# Patient Record
Sex: Male | Born: 1955
Health system: Southern US, Community
[De-identification: ages and names within clinical notes are randomized; demographics above are authoritative.]

## PROBLEM LIST (undated history)

## (undated) DIAGNOSIS — Z87442 Personal history of urinary calculi: Secondary | ICD-10-CM

## (undated) DIAGNOSIS — F319 Bipolar disorder, unspecified: Secondary | ICD-10-CM

## (undated) DIAGNOSIS — F32A Depression, unspecified: Secondary | ICD-10-CM

## (undated) DIAGNOSIS — Z136 Encounter for screening for cardiovascular disorders: Secondary | ICD-10-CM

## (undated) DIAGNOSIS — G4730 Sleep apnea, unspecified: Secondary | ICD-10-CM

## (undated) DIAGNOSIS — I219 Acute myocardial infarction, unspecified: Secondary | ICD-10-CM

## (undated) DIAGNOSIS — N200 Calculus of kidney: Secondary | ICD-10-CM

## (undated) DIAGNOSIS — I519 Heart disease, unspecified: Secondary | ICD-10-CM

## (undated) DIAGNOSIS — R55 Syncope and collapse: Secondary | ICD-10-CM

## (undated) DIAGNOSIS — K219 Gastro-esophageal reflux disease without esophagitis: Secondary | ICD-10-CM

## (undated) DIAGNOSIS — I209 Angina pectoris, unspecified: Secondary | ICD-10-CM

## (undated) DIAGNOSIS — F419 Anxiety disorder, unspecified: Secondary | ICD-10-CM

## (undated) DIAGNOSIS — K635 Polyp of colon: Secondary | ICD-10-CM

## (undated) DIAGNOSIS — I10 Essential (primary) hypertension: Secondary | ICD-10-CM

## (undated) DIAGNOSIS — M1990 Unspecified osteoarthritis, unspecified site: Secondary | ICD-10-CM

## (undated) DIAGNOSIS — E119 Type 2 diabetes mellitus without complications: Secondary | ICD-10-CM

## (undated) DIAGNOSIS — M6200 Separation of muscle (nontraumatic), unspecified site: Secondary | ICD-10-CM

## (undated) DIAGNOSIS — E785 Hyperlipidemia, unspecified: Secondary | ICD-10-CM

## (undated) HISTORY — DX: Hyperlipidemia, unspecified: E78.5

## (undated) HISTORY — DX: Polyp of colon: K63.5

## (undated) HISTORY — DX: Heart disease, unspecified: I51.9

## (undated) HISTORY — DX: Essential (primary) hypertension: I10

## (undated) HISTORY — DX: Encounter for screening for cardiovascular disorders: Z13.6

## (undated) HISTORY — DX: Anxiety disorder, unspecified: F41.9

## (undated) HISTORY — DX: Gastro-esophageal reflux disease without esophagitis: K21.9

## (undated) HISTORY — DX: Syncope and collapse: R55

## (undated) HISTORY — PX: UPPER GI ENDOSCOPY: SHX6162

## (undated) HISTORY — DX: Unspecified osteoarthritis, unspecified site: M19.90

## (undated) HISTORY — PX: COLONOSCOPY: SHX174

---

## 1985-02-17 HISTORY — PX: INGUINAL HERNIA REPAIR: SUR1180

## 1998-02-17 HISTORY — PX: HEMORRHOID SURGERY: SHX153

## 2003-12-04 ENCOUNTER — Ambulatory Visit: Payer: Self-pay | Admitting: Internal Medicine

## 2006-02-17 DIAGNOSIS — K635 Polyp of colon: Secondary | ICD-10-CM

## 2006-02-17 HISTORY — DX: Polyp of colon: K63.5

## 2007-04-14 ENCOUNTER — Encounter: Payer: Self-pay | Admitting: Cardiology

## 2008-04-05 ENCOUNTER — Encounter: Payer: Self-pay | Admitting: Cardiology

## 2008-09-05 ENCOUNTER — Encounter: Payer: Self-pay | Admitting: Cardiology

## 2008-10-18 HISTORY — PX: LITHOTRIPSY: SUR834

## 2008-10-27 ENCOUNTER — Encounter: Payer: Self-pay | Admitting: Cardiology

## 2008-10-30 ENCOUNTER — Ambulatory Visit: Payer: Self-pay | Admitting: Cardiology

## 2008-10-30 DIAGNOSIS — R55 Syncope and collapse: Secondary | ICD-10-CM | POA: Insufficient documentation

## 2008-11-01 ENCOUNTER — Encounter: Payer: Self-pay | Admitting: Cardiology

## 2008-11-02 ENCOUNTER — Telehealth (INDEPENDENT_AMBULATORY_CARE_PROVIDER_SITE_OTHER): Payer: Self-pay | Admitting: *Deleted

## 2008-11-06 ENCOUNTER — Ambulatory Visit: Payer: Self-pay

## 2008-11-06 ENCOUNTER — Encounter: Payer: Self-pay | Admitting: Cardiology

## 2008-11-06 ENCOUNTER — Ambulatory Visit: Payer: Self-pay | Admitting: Cardiology

## 2008-11-09 ENCOUNTER — Ambulatory Visit: Payer: Self-pay | Admitting: Urology

## 2008-11-15 ENCOUNTER — Ambulatory Visit: Payer: Self-pay | Admitting: Urology

## 2008-11-23 ENCOUNTER — Ambulatory Visit: Payer: Self-pay | Admitting: Urology

## 2008-12-07 ENCOUNTER — Ambulatory Visit: Payer: Self-pay | Admitting: Urology

## 2009-02-17 DIAGNOSIS — M6200 Separation of muscle (nontraumatic), unspecified site: Secondary | ICD-10-CM

## 2009-02-17 DIAGNOSIS — Z136 Encounter for screening for cardiovascular disorders: Secondary | ICD-10-CM

## 2009-02-17 HISTORY — DX: Separation of muscle (nontraumatic), unspecified site: M62.00

## 2009-02-17 HISTORY — DX: Encounter for screening for cardiovascular disorders: Z13.6

## 2009-03-12 ENCOUNTER — Ambulatory Visit: Payer: Self-pay | Admitting: Urology

## 2009-04-15 ENCOUNTER — Emergency Department: Payer: Self-pay | Admitting: Emergency Medicine

## 2009-09-03 ENCOUNTER — Ambulatory Visit: Payer: Self-pay | Admitting: Urology

## 2009-10-11 ENCOUNTER — Ambulatory Visit: Payer: Self-pay | Admitting: Urology

## 2009-10-18 HISTORY — PX: URETEROSCOPY: SHX842

## 2009-10-24 ENCOUNTER — Ambulatory Visit: Payer: Self-pay | Admitting: Urology

## 2009-11-15 ENCOUNTER — Ambulatory Visit: Payer: Self-pay | Admitting: Urology

## 2010-04-08 ENCOUNTER — Ambulatory Visit: Payer: Self-pay | Admitting: Urology

## 2010-09-05 ENCOUNTER — Encounter: Payer: Self-pay | Admitting: Cardiology

## 2010-10-17 ENCOUNTER — Ambulatory Visit: Payer: Self-pay | Admitting: Urology

## 2011-02-18 DIAGNOSIS — I209 Angina pectoris, unspecified: Secondary | ICD-10-CM

## 2011-02-18 HISTORY — DX: Angina pectoris, unspecified: I20.9

## 2011-05-19 DIAGNOSIS — I219 Acute myocardial infarction, unspecified: Secondary | ICD-10-CM

## 2011-05-19 HISTORY — DX: Acute myocardial infarction, unspecified: I21.9

## 2011-06-18 HISTORY — PX: CARDIAC CATHETERIZATION: SHX172

## 2011-06-19 ENCOUNTER — Other Ambulatory Visit (HOSPITAL_COMMUNITY): Payer: Self-pay | Admitting: Family Medicine

## 2011-06-19 DIAGNOSIS — R079 Chest pain, unspecified: Secondary | ICD-10-CM

## 2011-06-19 DIAGNOSIS — R0602 Shortness of breath: Secondary | ICD-10-CM

## 2011-06-24 ENCOUNTER — Encounter: Payer: Self-pay | Admitting: Cardiology

## 2011-06-24 ENCOUNTER — Ambulatory Visit (HOSPITAL_COMMUNITY): Payer: BC Managed Care – PPO | Attending: Cardiology | Admitting: Radiology

## 2011-06-24 VITALS — BP 141/86 | Ht 72.0 in | Wt 227.0 lb

## 2011-06-24 DIAGNOSIS — I4949 Other premature depolarization: Secondary | ICD-10-CM

## 2011-06-24 DIAGNOSIS — R5381 Other malaise: Secondary | ICD-10-CM | POA: Insufficient documentation

## 2011-06-24 DIAGNOSIS — Z87891 Personal history of nicotine dependence: Secondary | ICD-10-CM | POA: Insufficient documentation

## 2011-06-24 DIAGNOSIS — E663 Overweight: Secondary | ICD-10-CM | POA: Insufficient documentation

## 2011-06-24 DIAGNOSIS — I471 Supraventricular tachycardia, unspecified: Secondary | ICD-10-CM

## 2011-06-24 DIAGNOSIS — R0789 Other chest pain: Secondary | ICD-10-CM | POA: Insufficient documentation

## 2011-06-24 DIAGNOSIS — Z8249 Family history of ischemic heart disease and other diseases of the circulatory system: Secondary | ICD-10-CM | POA: Insufficient documentation

## 2011-06-24 DIAGNOSIS — R079 Chest pain, unspecified: Secondary | ICD-10-CM

## 2011-06-24 DIAGNOSIS — R5383 Other fatigue: Secondary | ICD-10-CM | POA: Insufficient documentation

## 2011-06-24 DIAGNOSIS — I10 Essential (primary) hypertension: Secondary | ICD-10-CM | POA: Insufficient documentation

## 2011-06-24 DIAGNOSIS — E785 Hyperlipidemia, unspecified: Secondary | ICD-10-CM | POA: Insufficient documentation

## 2011-06-24 MED ORDER — TECHNETIUM TC 99M TETROFOSMIN IV KIT
11.0000 | PACK | Freq: Once | INTRAVENOUS | Status: AC | PRN
  Administered 2011-06-24: 11 via INTRAVENOUS

## 2011-06-24 MED ORDER — TECHNETIUM TC 99M TETROFOSMIN IV KIT
33.0000 | PACK | Freq: Once | INTRAVENOUS | Status: AC | PRN
  Administered 2011-06-24: 33 via INTRAVENOUS

## 2011-06-24 NOTE — Progress Notes (Signed)
[facility] SITE 3 NUCLEAR MED [address] [place] [address] [phone]  Cardiology Nuclear Med Study  Keith Gross is a 56 y.o. male     MRN : [id number]     DOB: 1955-04-26  Procedure Date: 06/24/2011  Nuclear Med Background Indication for Stress Test:  Evaluation for Ischemia History:  '10 Echo:EF=55-60%; '10 MPS:No ischemia, EF=59% Cardiac Risk Factors: Family History - CAD, History of Smoking, Hypertension, Lipids and Overweight  Symptoms:  Chest Pressure.  (last episode of chest discomfort was 10-days ago) and Fatigue   Nuclear Pre-Procedure Caffeine/Decaff Intake:  None NPO After: 7:00am   Lungs:  Clear. IV 0.9% NS with Angio Cath:  18g  IV Site: R Antecubital  IV Started by:  Stanton Kidney, EMT-P  Chest Size (in):  48 Cup Size: n/a  Height: 6' (1.829 m)  Weight:  227 lb (102.967 kg)  BMI:  Body mass index is 30.79 kg/(m^2). Tech Comments:  AM meds. taken    Nuclear Med Study 1 or 2 day study: 1 day  Stress Test Type:  Stress  Reading MD: Willa Rough, MD  Order Authorizing Provider:  Nilda Simmer, MD  Resting Radionuclide: Technetium 63m Tetrofosmin  Resting Radionuclide Dose: 11.0 mCi   Stress Radionuclide:  Technetium 69m Tetrofosmin  Stress Radionuclide Dose: 33.0 mCi           Stress Protocol Rest HR: 71 Stress HR: 156  Rest BP: 141/86 Stress BP: 211/86  Exercise Time (min): 10:45 METS: 12.9   Predicted Max HR: 166 bpm % Max HR: 93.98 bpm Rate Pressure Product: 21308   Dose of Adenosine (mg):  n/a Dose of Lexiscan: n/a mg  Dose of Atropine (mg): n/a Dose of Dobutamine: n/a mcg/kg/min (at max HR)  Stress Test Technologist: Smiley Houseman, CMA-N  Nuclear Technologist:  Domenic Polite, CNMT     Rest Procedure:  Myocardial perfusion imaging was performed at rest 45 minutes following the intravenous administration of Technetium 59m Tetrofosmin.  Rest ECG: No acute changes  Stress Procedure:  The patient exercised on the treadmill  utilizing the Bruce protocol for 10:45 minutes. He then stopped due to fatigue and denied any chest pain.  There were no diagnostic ST-T wave changes; there were frequent PVC's and runs of v-tach.  Hypertensive response to exercise, 211/86.    Technetium 102m Tetrofosmin was injected at peak exercise and myocardial perfusion imaging was performed after a brief delay.  Stress ECG: No significant change from baseline ECG  QPS Raw Data Images:  Patient motion noted; appropriate software correction applied. Stress Images:  There is severe decrease in photon activity in the base, mid, and apical segments of the inferolateral wall. There is severe decreased activity in the base, mid, and apical segments of the anterolateral wall. There is mild decreased activity in the segments of the inferior wall. Rest Images:  The rest images are very similar to the stress images. Subtraction (SDS):  No evidence of ischemia. Transient Ischemic Dilatation (Normal <1.22):  0.97 Lung/Heart Ratio (Normal <0.45):  0.41  Quantitative Gated Spect Images QGS EDV:  150 ml QGS ESV:  76 ml  Impression Exercise Capacity:  Excellent exercise capacity. BP Response:  Hypertensive blood pressure response. Clinical Symptoms:  No symptoms. ECG Impression:  No significant ST segment change suggestive of ischemia. Comparison with Prior Nuclear Study:  Refer to the comments below  Overall Impression:  There is a large scar affecting the inferolateral wall and the lateral wall. There is  no significant ischemia. These abnormalities are new since the prior study dated September, 2010. The patient exercises well. However since these changes are new we have made plans for the patient to be seen in the office in [facility] tomorrow  LV Ejection Fraction: 49%.  LV Wall Motion:  There is hypokinesis of the inferolateral wall and lateral wall.  Jerral Bonito, MD

## 2011-06-24 NOTE — Progress Notes (Signed)
   The patient had a nuclear stress study today in the office. With stress he did not have any marked chest pain or EKG changes. After he was gone the images were reviewed carefully. The report is currently being generated. The patient has a large area of scar in the inferolateral and lateral wall. This was not present on the nuclear study that was done in our office in 2010. The study was obtained by the patient's primary physician because of recent chest discomfort. With this new finding I have contacted our [place] office and asked that the patient be added into the office schedule tomorrow for early followup.  Jerral Bonito, MD

## 2011-06-25 ENCOUNTER — Encounter: Payer: Self-pay | Admitting: Cardiovascular Disease

## 2011-06-25 ENCOUNTER — Other Ambulatory Visit: Payer: Self-pay | Admitting: Cardiovascular Disease

## 2011-06-25 ENCOUNTER — Ambulatory Visit (INDEPENDENT_AMBULATORY_CARE_PROVIDER_SITE_OTHER): Payer: BC Managed Care – PPO | Admitting: Cardiovascular Disease

## 2011-06-25 VITALS — BP 158/92 | HR 82 | Ht 72.0 in | Wt 228.0 lb

## 2011-06-25 DIAGNOSIS — Z01818 Encounter for other preprocedural examination: Secondary | ICD-10-CM

## 2011-06-25 DIAGNOSIS — I10 Essential (primary) hypertension: Secondary | ICD-10-CM | POA: Insufficient documentation

## 2011-06-25 DIAGNOSIS — R9439 Abnormal result of other cardiovascular function study: Secondary | ICD-10-CM | POA: Insufficient documentation

## 2011-06-25 DIAGNOSIS — R0789 Other chest pain: Secondary | ICD-10-CM | POA: Insufficient documentation

## 2011-06-25 DIAGNOSIS — E785 Hyperlipidemia, unspecified: Secondary | ICD-10-CM | POA: Insufficient documentation

## 2011-06-25 DIAGNOSIS — Z5181 Encounter for therapeutic drug level monitoring: Secondary | ICD-10-CM

## 2011-06-25 MED ORDER — ATORVASTATIN CALCIUM 20 MG PO TABS: 20.0000 mg | ORAL_TABLET | Freq: Every day | ORAL | Status: DC

## 2011-06-25 NOTE — Assessment & Plan Note (Signed)
He reports blood pressure has been high. We will start exforge 5/160/25 mg daily. He can take additional HCTZ in the p.m. as directed by Dr. Assunta Gambles. If blood pressure continues to be elevated, the exforge dose could be titrated upwards.

## 2011-06-25 NOTE — Patient Instructions (Signed)
You are doing well. Please increase aspirin to 81 mg x2 a day Start lipitor 20mg  daily (1/2 a pill for the first week or two then increase to full pill if tolerated)  We will schedule a cardiac cath in [place]  Please hold diovan and HCTZ. Start exforge HCTZ 5/160/25 once a day. It is ok to take an additional HCTZ 12.5 mg daily  Please call us if you have new issues that need to be addressed before your next appt.

## 2011-06-25 NOTE — Assessment & Plan Note (Signed)
Perfusion defect seen in the inferolateral wall, large area. I suspect this vessel has occluded. Question will be whether it can be opened and or whether there are adequate collaterals. Cardiac catheterization planned for next week. Currently feels well vague chest discomfort.

## 2011-06-25 NOTE — Assessment & Plan Note (Signed)
We will start Lipitor 10 mg titrating to 20 mg daily as tolerated. Goal LDL less than 70.

## 2011-06-25 NOTE — Progress Notes (Signed)
Patient ID: Keith Gross, male    DOB: December 04, 1955, 56 y.o.   MRN: [id number]  HPI Comments: Keith Gross is a very pleasant 56 year old gentleman, patient of Dr. Nilda Simmer and Assunta Gambles, remote history of syncope in 2010 felt secondary to low blood pressure that improved with medication changes, presenting to Dr. Katrinka Blazing  with chest pain symptoms at the beginning of  April 2013, stress test this past week showing inferior lateral hypoperfusion consistent with large region of scar which is new from 2010. He presents for followup.  He reports that he had severe chest pain at the beginning of April, 8/10 pain. He feels that something is wrong in his chest but has not had severe pain like that over the past month. He was able to treadmill to a good level with peak heart rate in the 160 range. At home his blood pressure has been labile, sometimes up to systolic pressure of 200 though typically elevated. He is requesting further medication modification for his blood pressure. Currently he is taking HCTZ 12.5 mg in the morning and 25 mg in the evening, Diovan 80 mg daily.  He does have a previous history of smoking, quit in 1992. Father had his first heart attack at age 45. EKG shows normal sinus rhythm with rate 82 beats per minute with T wave abnormality in V5, V6, 1 and aVL, left axis deviation. These are new findings compared to previous EKG   Outpatient Encounter Prescriptions as of 06/25/2011  Medication Sig Dispense Refill  . allopurinol (ZYLOPRIM) 100 MG tablet Take 100 mg by mouth 2 (two) times daily.        Marland Kitchen aspirin 81 MG EC tablet Take 2 tablets (162 mg total) by mouth daily.  30 tablet    . CINNAMON PO Take 1,000 mg by mouth daily.      Marland Kitchen docusate sodium (COLACE) 100 MG capsule Take 300 mg by mouth every evening.        . fexofenadine (ALLEGRA) 180 MG tablet Take 180 mg by mouth daily as needed.        . hydrochlorothiazide (HYDRODIURIL) 25 MG tablet Take 25 mg by mouth in the p.m.  Also  takes half dose in the morning       . Omega-3 Fatty Acids (FISH OIL) 1000 MG CAPS Take 2,000 mg by mouth daily.      . pantoprazole (PROTONIX) 40 MG tablet Take 40 mg by mouth daily.      . potassium citrate (UROCIT-K) 10 MEQ (1080 MG) SR tablet Take 10 mEq by mouth 2 (two) times daily.      . Testosterone (ANDROGEL TD) Place 1.62 % onto the skin daily.      Marland Kitchen VITAMIN D, CHOLECALCIFEROL, PO Take 2,000 mg by mouth daily.      .  Diovan 80 mg tablet   1 per day       .  aspirin 81 MG EC tablet Take 81 mg by mouth daily.           Review of Systems  Constitutional: Negative.   HENT: Negative.   Eyes: Negative.   Respiratory: Positive for shortness of breath.   Cardiovascular: Positive for chest pain.  Gastrointestinal: Negative.   Musculoskeletal: Negative.   Skin: Negative.   Neurological: Negative.   Hematological: Negative.   Psychiatric/Behavioral: Negative.   All other systems reviewed and are negative.    BP 158/92  Pulse 82  Ht 6' (1.829 m)  [employer]  228 lb (103.42 kg)  BMI 30.92 kg/m2  Physical Exam  Nursing note and vitals reviewed. Constitutional: He is oriented to person, place, and time. He appears well-developed and well-nourished.  HENT:  Head: Normocephalic.  Nose: Nose normal.  Mouth/Throat: Oropharynx is clear and moist.  Eyes: Conjunctivae are normal. Pupils are equal, round, and reactive to light.  Neck: Normal range of motion. Neck supple. No JVD present.  Cardiovascular: Normal rate, regular rhythm, S1 normal, S2 normal, normal heart sounds and intact distal pulses.  Exam reveals no gallop and no friction rub.   No murmur heard. Pulmonary/Chest: Effort normal and breath sounds normal. No respiratory distress. He has no wheezes. He has no rales. He exhibits no tenderness.  Abdominal: Soft. Bowel sounds are normal. He exhibits no distension. There is no tenderness.  Musculoskeletal: Normal range of motion. He exhibits no edema and no tenderness.    Lymphadenopathy:    He has no cervical adenopathy.  Neurological: He is alert and oriented to person, place, and time. Coordination normal.  Skin: Skin is warm and dry. No rash noted. No erythema.  Psychiatric: He has a normal mood and affect. His behavior is normal. Judgment and thought content normal.           Assessment and Plan

## 2011-06-25 NOTE — Assessment & Plan Note (Signed)
I suspect he had an MI probably of the circumflex in early April 2013. This is seen on stress test. Mild vague symptoms since that time. We will set him up for a cardiac catheterization for further evaluation. We have increased his aspirin to 81 mg x2, started Lipitor 10 mg daily titrating up to 20 mg daily.

## 2011-06-26 LAB — CBC WITH DIFFERENTIAL/PLATELET
Basos: 0 % (ref 0–3)
Eos: 1 % (ref 0–7)
HCT: 48.6 % (ref 37.5–51.0)
Hemoglobin: 17 g/dL (ref 12.6–17.7)
Immature Granulocytes: 0 % (ref 0–2)
Lymphocytes Absolute: 1.6 10*3/uL (ref 0.7–4.5)
MCV: 91 fL (ref 79–97)
Monocytes: 8 % (ref 4–13)
Neutrophils Absolute: 3.6 10*3/uL (ref 1.8–7.8)
RBC: 5.33 x10E6/uL (ref 4.14–5.80)
RDW: 13.4 % (ref 12.3–15.4)
WBC: 5.7 10*3/uL (ref 4.0–10.5)

## 2011-06-26 LAB — BASIC METABOLIC PANEL
BUN/Creatinine Ratio: 15 (ref 9–20)
BUN: 15 mg/dL (ref 6–24)
CO2: 23 mmol/L (ref 20–32)
Creatinine, Ser: 0.97 mg/dL (ref 0.76–1.27)
GFR calc non Af Amer: 88 mL/min/{1.73_m2} (ref >59)

## 2011-06-26 LAB — PROTIME-INR: Prothrombin Time: 10.9 s (ref 9.1–12.0)

## 2011-06-27 ENCOUNTER — Telehealth: Payer: Self-pay | Admitting: Cardiovascular Disease

## 2011-06-27 NOTE — Telephone Encounter (Signed)
PT'S WIFE AWARE OF RECENT  LAB WORK ONLY ABNORMALITY WAS  AN  ELEVATED GLUCOSE   ONLY,   PER WIFE PT HAD EATEN PRIOR TO COMING IN .Zack Seal

## 2011-06-27 NOTE — Telephone Encounter (Signed)
New msg Pt's wife was calling about lab results

## 2011-06-29 ENCOUNTER — Encounter: Payer: Self-pay | Admitting: Cardiovascular Disease

## 2011-07-02 ENCOUNTER — Other Ambulatory Visit: Payer: Self-pay | Admitting: Cardiovascular Disease

## 2011-07-02 NOTE — H&P (Signed)
Keith Gross is a very pleasant 56 year old gentleman, patient of Dr. Nilda Simmer and Assunta Gambles, remote history of syncope in 2010 felt secondary to low blood pressure that improved with medication changes, presenting to Dr. Katrinka Blazing with chest pain symptoms at the beginning of April 2013, stress test this past week showing inferior lateral hypoperfusion consistent with large region of scar which is new from 2010. He presents for followup.  He reports that he had severe chest pain at the beginning of April, 8/10 pain. He feels that something is wrong in his chest but has not had severe pain like that over the past month. He was able to treadmill to a good level with peak heart rate in the 160 range.  At home his blood pressure has been labile, sometimes up to systolic pressure of 200 though typically elevated. He is requesting further medication modification for his blood pressure. Currently he is taking HCTZ 12.5 mg in the morning and 25 mg in the evening, Diovan 80 mg daily.  He does have a previous history of smoking, quit in 1992. Father had his first heart attack at age 31.  EKG shows normal sinus rhythm with rate 82 beats per minute with T wave abnormality in V5, V6, 1 and aVL, left axis deviation. These are new findings compared to previous EKG   Outpatient Encounter Prescriptions as of 06/25/2011   Medication  Sig  Dispense  Refill   .  allopurinol (ZYLOPRIM) 100 MG tablet  Take 100 mg by mouth 2 (two) times daily.     Marland Kitchen  aspirin 81 MG EC tablet  Take 2 tablets (162 mg total) by mouth daily.  30 tablet    .  CINNAMON PO  Take 1,000 mg by mouth daily.     Marland Kitchen  docusate sodium (COLACE) 100 MG capsule  Take 300 mg by mouth every evening.     .  fexofenadine (ALLEGRA) 180 MG tablet  Take 180 mg by mouth daily as needed.     .  hydrochlorothiazide (HYDRODIURIL) 25 MG tablet  Take 25 mg by mouth in the p.m.  Also takes half dose in the morning     .  Omega-3 Fatty Acids (FISH OIL) 1000 MG CAPS  Take 2,000  mg by mouth daily.     .  pantoprazole (PROTONIX) 40 MG tablet  Take 40 mg by mouth daily.     .  potassium citrate (UROCIT-K) 10 MEQ (1080 MG) SR tablet  Take 10 mEq by mouth 2 (two) times daily.     .  Testosterone (ANDROGEL TD)  Place 1.62 % onto the skin daily.     Marland Kitchen  VITAMIN D, CHOLECALCIFEROL, PO  Take 2,000 mg by mouth daily.     .  Diovan 80 mg tablet  1 per day     .  aspirin 81 MG EC tablet  Take 81 mg by mouth daily.     Review of Systems  Constitutional: Negative.  HENT: Negative.  Eyes: Negative.  Respiratory: Positive for shortness of breath.  Cardiovascular: Positive for chest pain.  Gastrointestinal: Negative.  Musculoskeletal: Negative.  Skin: Negative.  Neurological: Negative.  Hematological: Negative.  Psychiatric/Behavioral: Negative.  All other systems reviewed and are negative.   BP 158/92  Pulse 82  Ht 6' (1.829 m)  Wt 228 lb (103.42 kg)  BMI 30.92 kg/m2  Physical Exam  Nursing note and vitals reviewed.  Constitutional: He is oriented to person, place, and time. He appears well-developed  and well-nourished.  HENT:  Head: Normocephalic.  Nose: Nose normal.  Mouth/Throat: Oropharynx is clear and moist.  Eyes: Conjunctivae are normal. Pupils are equal, round, and reactive to light.  Neck: Normal range of motion. Neck supple. No JVD present.  Cardiovascular: Normal rate, regular rhythm, S1 normal, S2 normal, normal heart sounds and intact distal pulses. Exam reveals no gallop and no friction rub.  No murmur heard.  Pulmonary/Chest: Effort normal and breath sounds normal. No respiratory distress. He has no wheezes. He has no rales. He exhibits no tenderness.  Abdominal: Soft. Bowel sounds are normal. He exhibits no distension. There is no tenderness.  Musculoskeletal: Normal range of motion. He exhibits no edema and no tenderness.  Lymphadenopathy:  He has no cervical adenopathy.  Neurological: He is alert and oriented to person, place, and time.  Coordination normal.  Skin: Skin is warm and dry. No rash noted. No erythema.  Psychiatric: He has a normal mood and affect. His behavior is normal. Judgment and thought content normal.      Assessment and Plan   Chest pressure -  I suspect he had an MI probably of the circumflex in early April 2013.  This is seen on stress test. Mild vague symptoms since that time. We will set him up for a cardiac catheterization for further evaluation. We have increased his aspirin to 81 mg x2, started Lipitor 10 mg daily titrating up to 20 mg daily.  Abnormal stress test -  Perfusion defect seen in the inferolateral wall, large area. I suspect this vessel has occluded. Question will be whether it can be opened and or whether there are adequate collaterals. Cardiac catheterization planned . Currently feels well vague chest discomfort.  Hyperlipidemia -  We will start Lipitor 10 mg titrating to 20 mg daily as tolerated. Goal LDL less than 70.  HYPERTENSION, UNSPECIFIED -  He reports blood pressure has been high. We will start exforge 5/160/25 mg daily. He can take additional HCTZ in the p.m. as directed by Dr. Assunta Gambles. If blood pressure continues to be elevated, the exforge dose could be titrated upwards.

## 2011-07-03 ENCOUNTER — Inpatient Hospital Stay (HOSPITAL_BASED_OUTPATIENT_CLINIC_OR_DEPARTMENT_OTHER)
Admission: RE | Admit: 2011-07-03 | Discharge: 2011-07-03 | Disposition: A | Discharge status: Home / Self Care | Payer: BC Managed Care – PPO | Source: Ambulatory Visit | Attending: Cardiovascular Disease | Admitting: Cardiovascular Disease

## 2011-07-03 ENCOUNTER — Encounter (HOSPITAL_BASED_OUTPATIENT_CLINIC_OR_DEPARTMENT_OTHER): Payer: Self-pay | Admitting: Cardiovascular Disease

## 2011-07-03 ENCOUNTER — Telehealth: Payer: Self-pay

## 2011-07-03 ENCOUNTER — Encounter (HOSPITAL_BASED_OUTPATIENT_CLINIC_OR_DEPARTMENT_OTHER)
Admission: RE | Disposition: A | Discharge status: Home / Self Care | Payer: Self-pay | Source: Ambulatory Visit | Attending: Cardiovascular Disease

## 2011-07-03 ENCOUNTER — Other Ambulatory Visit: Payer: Self-pay | Admitting: Cardiovascular Disease

## 2011-07-03 DIAGNOSIS — I2510 Atherosclerotic heart disease of native coronary artery without angina pectoris: Secondary | ICD-10-CM | POA: Insufficient documentation

## 2011-07-03 DIAGNOSIS — R9431 Abnormal electrocardiogram [ECG] [EKG]: Secondary | ICD-10-CM | POA: Insufficient documentation

## 2011-07-03 DIAGNOSIS — R9439 Abnormal result of other cardiovascular function study: Secondary | ICD-10-CM | POA: Insufficient documentation

## 2011-07-03 DIAGNOSIS — R0602 Shortness of breath: Secondary | ICD-10-CM | POA: Insufficient documentation

## 2011-07-03 LAB — POCT ACTIVATED CLOTTING TIME: Activated Clotting Time: 138 seconds

## 2011-07-03 SURGERY — JV LEFT HEART CATHETERIZATION WITH CORONARY ANGIOGRAM
Anesthesia: Moderate Sedation

## 2011-07-03 MED ORDER — ONDANSETRON HCL 4 MG/2ML IJ SOLN: 4.0000 mg | Freq: Four times a day (QID) | INTRAMUSCULAR | Status: DC | PRN

## 2011-07-03 MED ORDER — ASPIRIN 81 MG PO CHEW
324.0000 mg | CHEWABLE_TABLET | ORAL | Status: AC
  Administered 2011-07-03: 324 mg via ORAL

## 2011-07-03 MED ORDER — CLOPIDOGREL BISULFATE 75 MG PO TABS: 75.0000 mg | ORAL_TABLET | Freq: Every day | ORAL | Status: DC

## 2011-07-03 MED ORDER — SODIUM CHLORIDE 0.9 % IJ SOLN
3.0000 mL | Freq: Two times a day (BID) | INTRAMUSCULAR | Status: DC
  Administered 2011-07-03: 3 mL via INTRAVENOUS

## 2011-07-03 MED ORDER — ACETAMINOPHEN 325 MG PO TABS: 650.0000 mg | ORAL_TABLET | ORAL | Status: DC | PRN

## 2011-07-03 NOTE — Progress Notes (Signed)
Bedrest begins @ 1530.

## 2011-07-03 NOTE — Op Note (Signed)
Cardiac Catheterization Procedure Note  Name: Keith Gross MRN: [id number] DOB: 07-31-1955  Procedure: Left Heart Cath, Selective Coronary Angiography, LV angiography  Indication:  Positive stress test, mild shortness of breath, abnormal EKG  Procedural details: The right groin was prepped, draped, and anesthetized with 1% lidocaine. Using modified Seldinger technique, a 5 French sheath was introduced into the right femoral artery. Standard Judkins catheters were used for coronary angiography and left ventriculography. Catheter exchanges were performed over a guidewire. There were no immediate procedural complications. The patient was transferred to the post catheterization recovery area for further monitoring.  Procedural Findings:  Hemodynamics:     AO 128/76/100    LV 129/13/17   Coronary angiography:  Coronary dominance: Right  Left mainstem:   Large vessel that bifurcates to the LAD and left circumflex. No significant disease noted.  Left anterior descending (LAD):   Moderate size vessel with mild luminal irregularities, Diagonal x2 that are small to moderate in size.   Left circumflex (LCx):  Large vessel with OM x2. OM1 is  sub-totally occluded in the proximal region after the takeoff of a small branch. Mid and bifurcating distal vessel fills late. Small left to left collaterals. There is at least moderate, possibly moderate to severe disease (60-70%) of the ostial true circumflex after the takeoff of the OM 2  Right coronary artery (RCA): Codominant or nondominant branch . Moderate size vessel with small distal PL branch. No significant disease noted  Left ventriculography: Left ventricular systolic function is normal, LVEF is estimated at 55-65%, there is no significant mitral regurgitation. No significant aortic valve stenosis  Final Conclusions:   severe single vessel disease with subtotally occluded OM1, minimal collaterals to mid and distal OM . Also with at least moderate,  possibly moderate to severe ostial true circumflex after the takeoff of the OM 2 .  Recommendations:  will discuss the case with interventional cardiology to determine if lesion is amenable to PCI . As he continues to have mild shortness of breath, some chest discomfort, would lean towards intervention . We'll discuss other lesion in the true circumflex with interventional cardiology as well . We'll recommend aggressive medical management .   Julien Nordmann 07/03/2011, 1:36 PM

## 2011-07-03 NOTE — Progress Notes (Signed)
Dr. Mariah Milling in to discuss results with patient and family.

## 2011-07-03 NOTE — Telephone Encounter (Signed)
Refill sent for plavix 75 mg take one tablet daily.  

## 2011-07-03 NOTE — Discharge Instructions (Signed)
Discharge pending discussion with interventional cardiology concerning severe circumflex stenosis, and need for possible PCI.

## 2011-07-04 ENCOUNTER — Telehealth: Payer: Self-pay

## 2011-07-04 NOTE — Telephone Encounter (Signed)
rec'd call from patient's wife. She was made aware of time change for procedure. Advised ok, per Dr. Mariah Milling, ok for patient to have clear liquid breakfast am of procedure. Understanding verb.

## 2011-07-04 NOTE — Telephone Encounter (Signed)
Rec'd call from cath lab saying they needed to r/s time of intervention scheduled for 5/22. R/s for 1330 from an original 0830 procedure. I called patient to give him this info but had to leave a message.

## 2011-07-07 ENCOUNTER — Encounter (HOSPITAL_COMMUNITY): Payer: Self-pay | Admitting: Respiratory Therapy

## 2011-07-09 ENCOUNTER — Other Ambulatory Visit: Payer: Self-pay | Admitting: Cardiovascular Disease

## 2011-07-09 ENCOUNTER — Encounter (HOSPITAL_COMMUNITY)
Admission: RE | Disposition: A | Discharge status: Home / Self Care | Payer: Self-pay | Source: Ambulatory Visit | Attending: Cardiovascular Disease

## 2011-07-09 ENCOUNTER — Ambulatory Visit (HOSPITAL_COMMUNITY)
Admission: RE | Admit: 2011-07-09 | Discharge: 2011-07-10 | Disposition: A | Discharge status: Home / Self Care | Payer: BC Managed Care – PPO | Source: Ambulatory Visit | Attending: Cardiovascular Disease | Admitting: Cardiovascular Disease

## 2011-07-09 ENCOUNTER — Encounter (HOSPITAL_COMMUNITY): Payer: Self-pay | Admitting: General Practice

## 2011-07-09 DIAGNOSIS — N200 Calculus of kidney: Secondary | ICD-10-CM

## 2011-07-09 DIAGNOSIS — R0789 Other chest pain: Secondary | ICD-10-CM

## 2011-07-09 DIAGNOSIS — F419 Anxiety disorder, unspecified: Secondary | ICD-10-CM | POA: Insufficient documentation

## 2011-07-09 DIAGNOSIS — M109 Gout, unspecified: Secondary | ICD-10-CM | POA: Insufficient documentation

## 2011-07-09 DIAGNOSIS — G4730 Sleep apnea, unspecified: Secondary | ICD-10-CM | POA: Insufficient documentation

## 2011-07-09 DIAGNOSIS — F411 Generalized anxiety disorder: Secondary | ICD-10-CM | POA: Insufficient documentation

## 2011-07-09 DIAGNOSIS — I25118 Atherosclerotic heart disease of native coronary artery with other forms of angina pectoris: Secondary | ICD-10-CM

## 2011-07-09 DIAGNOSIS — Z955 Presence of coronary angioplasty implant and graft: Secondary | ICD-10-CM

## 2011-07-09 DIAGNOSIS — I2510 Atherosclerotic heart disease of native coronary artery without angina pectoris: Secondary | ICD-10-CM | POA: Insufficient documentation

## 2011-07-09 DIAGNOSIS — I200 Unstable angina: Secondary | ICD-10-CM

## 2011-07-09 DIAGNOSIS — K219 Gastro-esophageal reflux disease without esophagitis: Secondary | ICD-10-CM | POA: Insufficient documentation

## 2011-07-09 DIAGNOSIS — E785 Hyperlipidemia, unspecified: Secondary | ICD-10-CM | POA: Insufficient documentation

## 2011-07-09 DIAGNOSIS — I10 Essential (primary) hypertension: Secondary | ICD-10-CM | POA: Insufficient documentation

## 2011-07-09 HISTORY — DX: Acute myocardial infarction, unspecified: I21.9

## 2011-07-09 HISTORY — DX: Sleep apnea, unspecified: G47.30

## 2011-07-09 HISTORY — DX: Separation of muscle (nontraumatic), unspecified site: M62.00

## 2011-07-09 HISTORY — DX: Angina pectoris, unspecified: I20.9

## 2011-07-09 HISTORY — PX: CORONARY ANGIOPLASTY WITH STENT PLACEMENT: SHX49

## 2011-07-09 HISTORY — DX: Calculus of kidney: N20.0

## 2011-07-09 HISTORY — PX: PERCUTANEOUS CORONARY STENT INTERVENTION (PCI-S): SHX5485

## 2011-07-09 LAB — CBC
HCT: 45.8 % (ref 39.0–52.0)
MCH: 31.5 pg (ref 26.0–34.0)
MCV: 88.4 fL (ref 78.0–100.0)
RBC: 5.18 MIL/uL (ref 4.22–5.81)
WBC: 7.4 10*3/uL (ref 4.0–10.5)

## 2011-07-09 LAB — POCT ACTIVATED CLOTTING TIME: Activated Clotting Time: 419 seconds

## 2011-07-09 LAB — BASIC METABOLIC PANEL
CO2: 29 mEq/L (ref 19–32)
Chloride: 104 mEq/L (ref 96–112)
Creatinine, Ser: 1.03 mg/dL (ref 0.50–1.35)
Glucose, Bld: 86 mg/dL (ref 70–99)
Sodium: 142 mEq/L (ref 135–145)

## 2011-07-09 SURGERY — PERCUTANEOUS CORONARY STENT INTERVENTION (PCI-S)
Anesthesia: LOCAL

## 2011-07-09 MED ORDER — HYDROCHLOROTHIAZIDE 25 MG PO TABS
25.0000 mg | ORAL_TABLET | Freq: Every day | ORAL | Status: DC
  Filled 2011-07-09: qty 1

## 2011-07-09 MED ORDER — LIDOCAINE HCL (PF) 1 % IJ SOLN
INTRAMUSCULAR | Status: AC
  Filled 2011-07-09: qty 30

## 2011-07-09 MED ORDER — TESTOSTERONE 20.25 MG/ACT (1.62%) TD GEL: 1.0000 "application " | Freq: Every day | TRANSDERMAL | Status: DC

## 2011-07-09 MED ORDER — AMLODIPINE BESYLATE 5 MG PO TABS
5.0000 mg | ORAL_TABLET | Freq: Every day | ORAL | Status: DC
  Filled 2011-07-09: qty 1

## 2011-07-09 MED ORDER — DIAZEPAM 5 MG PO TABS
ORAL_TABLET | ORAL | Status: AC
  Filled 2011-07-09: qty 1

## 2011-07-09 MED ORDER — ASPIRIN 81 MG PO CHEW
324.0000 mg | CHEWABLE_TABLET | ORAL | Status: AC
  Administered 2011-07-09: 324 mg via ORAL

## 2011-07-09 MED ORDER — SODIUM CHLORIDE 0.9 % IV SOLN: 250.0000 mL | INTRAVENOUS | Status: DC | PRN

## 2011-07-09 MED ORDER — ALLOPURINOL 100 MG PO TABS
100.0000 mg | ORAL_TABLET | Freq: Two times a day (BID) | ORAL | Status: DC
  Filled 2011-07-09 (×2): qty 1

## 2011-07-09 MED ORDER — BIVALIRUDIN 250 MG IV SOLR
INTRAVENOUS | Status: AC
  Filled 2011-07-09: qty 250

## 2011-07-09 MED ORDER — SODIUM CHLORIDE 0.9 % IJ SOLN: 3.0000 mL | Freq: Two times a day (BID) | INTRAMUSCULAR | Status: DC

## 2011-07-09 MED ORDER — ROSUVASTATIN CALCIUM 20 MG PO TABS
20.0000 mg | ORAL_TABLET | Freq: Every day | ORAL | Status: DC
  Administered 2011-07-09: 20 mg via ORAL
  Filled 2011-07-09 (×2): qty 1

## 2011-07-09 MED ORDER — NITROGLYCERIN 0.2 MG/ML ON CALL CATH LAB
INTRAVENOUS | Status: AC
  Filled 2011-07-09: qty 1

## 2011-07-09 MED ORDER — SODIUM CHLORIDE 0.9 % IV SOLN: 250.0000 mL | INTRAVENOUS | Status: DC

## 2011-07-09 MED ORDER — FLUTICASONE PROPIONATE 50 MCG/ACT NA SUSP
2.0000 | Freq: Every day | NASAL | Status: DC
  Filled 2011-07-09 (×2): qty 16

## 2011-07-09 MED ORDER — ACETAMINOPHEN 325 MG PO TABS: 650.0000 mg | ORAL_TABLET | ORAL | Status: DC | PRN

## 2011-07-09 MED ORDER — FENTANYL CITRATE 0.05 MG/ML IJ SOLN
INTRAMUSCULAR | Status: AC
  Filled 2011-07-09: qty 2

## 2011-07-09 MED ORDER — ASPIRIN 81 MG PO CHEW
CHEWABLE_TABLET | ORAL | Status: AC
  Filled 2011-07-09: qty 4

## 2011-07-09 MED ORDER — DOCUSATE SODIUM 100 MG PO CAPS
100.0000 mg | ORAL_CAPSULE | Freq: Two times a day (BID) | ORAL | Status: DC
  Administered 2011-07-09: 100 mg via ORAL
  Filled 2011-07-09 (×4): qty 1

## 2011-07-09 MED ORDER — HEPARIN (PORCINE) IN NACL 2-0.9 UNIT/ML-% IJ SOLN
INTRAMUSCULAR | Status: AC
  Filled 2011-07-09: qty 2000

## 2011-07-09 MED ORDER — SODIUM CHLORIDE 0.9 % IV SOLN
INTRAVENOUS | Status: DC
  Administered 2011-07-09: 13:00:00 via INTRAVENOUS

## 2011-07-09 MED ORDER — SODIUM CHLORIDE 0.9 % IV SOLN: 1.0000 mL/kg/h | INTRAVENOUS | Status: AC

## 2011-07-09 MED ORDER — SODIUM CHLORIDE 0.9 % IJ SOLN: 3.0000 mL | INTRAMUSCULAR | Status: DC | PRN

## 2011-07-09 MED ORDER — CLOPIDOGREL BISULFATE 75 MG PO TABS: 600.0000 mg | ORAL_TABLET | ORAL | Status: DC

## 2011-07-09 MED ORDER — ONDANSETRON HCL 4 MG/2ML IJ SOLN: 4.0000 mg | Freq: Four times a day (QID) | INTRAMUSCULAR | Status: DC | PRN

## 2011-07-09 MED ORDER — PANTOPRAZOLE SODIUM 40 MG PO TBEC
40.0000 mg | DELAYED_RELEASE_TABLET | Freq: Every day | ORAL | Status: DC
  Filled 2011-07-09 (×2): qty 1

## 2011-07-09 MED ORDER — POTASSIUM CITRATE ER 10 MEQ (1080 MG) PO TBCR
10.0000 meq | EXTENDED_RELEASE_TABLET | Freq: Every day | ORAL | Status: DC
  Filled 2011-07-09: qty 1

## 2011-07-09 MED ORDER — MIDAZOLAM HCL 2 MG/2ML IJ SOLN
INTRAMUSCULAR | Status: AC
  Filled 2011-07-09: qty 2

## 2011-07-09 MED ORDER — LORATADINE 10 MG PO TABS
10.0000 mg | ORAL_TABLET | Freq: Every day | ORAL | Status: DC
  Filled 2011-07-09 (×3): qty 1

## 2011-07-09 MED ORDER — AMLODIPINE-VALSARTAN-HCTZ 5-160-25 MG PO TABS: ORAL_TABLET | Freq: Every day | ORAL | Status: DC

## 2011-07-09 MED ORDER — DIAZEPAM 5 MG PO TABS
5.0000 mg | ORAL_TABLET | ORAL | Status: AC
  Administered 2011-07-09: 5 mg via ORAL

## 2011-07-09 MED ORDER — CLOPIDOGREL BISULFATE 75 MG PO TABS
75.0000 mg | ORAL_TABLET | Freq: Every day | ORAL | Status: DC
  Filled 2011-07-09: qty 1

## 2011-07-09 MED ORDER — CLOPIDOGREL BISULFATE 300 MG PO TABS
ORAL_TABLET | ORAL | Status: AC
  Filled 2011-07-09: qty 2

## 2011-07-09 MED ORDER — IRBESARTAN 150 MG PO TABS
150.0000 mg | ORAL_TABLET | Freq: Every day | ORAL | Status: DC
  Filled 2011-07-09: qty 1

## 2011-07-09 NOTE — CV Procedure (Signed)
   CARDIAC CATH NOTE  Name: Keith Gross MRN: [id number] DOB: [birth year]  Procedure: PTCA and stenting of the ramus intermedius  Indication: Exertional angina, severe CAD, abnormal stress test. Diagnostic cath last week showed subtotal occlusion of the ramus branch with TIMI-0-1 flow. The patient had single vessel disease, exertional angina, and was referred for PCI. He has been adequately preloaded with plavix.  Procedural Details: The right wrist was prepped, draped, and anesthetized with 1% lidocaine. Using the modified Seldinger technique, a 6 Fr sheath was introduced into the radial artery. 2.5 mg nicardipine was administered through the radial sheath. Weight-based bivalirudin was given for anticoagulation. Once a therapeutic ACT was achieved, a 6 [place] XB-LAD guide catheter was inserted.  A Cougar coronary guidewire was used to cross the lesion without difficulty.  The lesion was predilated with a 2.0x79mm Sprinter balloon.  The lesion was then stented with a 2.25x7mm Resolute drug-eluting stent.  The stent was postdilated with a 2.5x50mm noncompliant balloon to 14 atm.  Following PCI, there was 0% residual stenosis and TIMI-3 flow. There was nonobstructive disease off the distal edge of the stent with diffuse vessel involvement. Final angiography confirmed an excellent result. The patient tolerated the procedure well. There were no immediate procedural complications. A TR band was used for radial hemostasis. The patient was transferred to the post catheterization recovery area for further monitoring.  Lesion Data: Vessel: Ramus intermedius Percent stenosis (pre): 100 TIMI-flow (pre):  0 Stent:  2.25 x 18 mm Resolute DES Percent stenosis (post): 0 TIMI-flow (post): 3  Conclusions: Successful PCI of a chronic total occlusion in the ramus intermedius  Recommendations: ASA and plavix for a minimum of 12 months.  Tonny Bollman 07/09/2011, 3:27 PM

## 2011-07-09 NOTE — Plan of Care (Signed)
Problem: Phase II Progression Outcomes Goal: Pain controlled Outcome: Completed/Met Date Met:  07/09/11 Remains painfree Goal: Vascular site scale level 0 - I Vascular Site Scale Level 0: No bruising/bleeding/hematoma Level I (Mild): Bruising/Ecchymosis, minimal bleeding/ooozing, palpable hematoma < 3 cm Level II (Moderate): Bleeding not affecting hemodynamic parameters, pseudoaneurysm, palpable hematoma > 3 cm Outcome: Completed/Met Date Met:  07/09/11 Vascular site level 0 Goal: No post PCI angina Outcome: Completed/Met Date Met:  07/09/11 No angina Goal: Distal pulses equal baseline assessment Outcome: Completed/Met Date Met:  07/09/11 Pulses presents bilateral radial, ulnar and pedal. Positive reverse allens right upper extremity Goal: Discharge plan established Outcome: Completed/Met Date Met:  07/09/11 Plan discharge tomorrow to home Goal: Tolerating diet Outcome: Completed/Met Date Met:  07/09/11 No problems with diet tolerated lunch

## 2011-07-10 ENCOUNTER — Encounter (HOSPITAL_COMMUNITY): Payer: Self-pay | Admitting: Nurse Practitioner

## 2011-07-10 DIAGNOSIS — I2510 Atherosclerotic heart disease of native coronary artery without angina pectoris: Secondary | ICD-10-CM

## 2011-07-10 DIAGNOSIS — I200 Unstable angina: Secondary | ICD-10-CM

## 2011-07-10 DIAGNOSIS — I25118 Atherosclerotic heart disease of native coronary artery with other forms of angina pectoris: Secondary | ICD-10-CM

## 2011-07-10 DIAGNOSIS — G4730 Sleep apnea, unspecified: Secondary | ICD-10-CM | POA: Insufficient documentation

## 2011-07-10 DIAGNOSIS — R0789 Other chest pain: Secondary | ICD-10-CM

## 2011-07-10 DIAGNOSIS — M109 Gout, unspecified: Secondary | ICD-10-CM | POA: Insufficient documentation

## 2011-07-10 DIAGNOSIS — K219 Gastro-esophageal reflux disease without esophagitis: Secondary | ICD-10-CM | POA: Insufficient documentation

## 2011-07-10 DIAGNOSIS — F419 Anxiety disorder, unspecified: Secondary | ICD-10-CM | POA: Insufficient documentation

## 2011-07-10 DIAGNOSIS — N200 Calculus of kidney: Secondary | ICD-10-CM

## 2011-07-10 LAB — CBC
HCT: 44.8 % (ref 39.0–52.0)
MCH: 31 pg (ref 26.0–34.0)
MCHC: 34.4 g/dL (ref 30.0–36.0)
MCV: 90.3 fL (ref 78.0–100.0)
RDW: 12.9 % (ref 11.5–15.5)

## 2011-07-10 LAB — BASIC METABOLIC PANEL
BUN: 17 mg/dL (ref 6–23)
Calcium: 8.9 mg/dL (ref 8.4–10.5)
Creatinine, Ser: 1.03 mg/dL (ref 0.50–1.35)
GFR calc Af Amer: >90 mL/min (ref >90)
GFR calc non Af Amer: 81 mL/min — ABNORMAL LOW (ref >90)
Potassium: 3.3 mEq/L — ABNORMAL LOW (ref 3.5–5.1)

## 2011-07-10 MED ORDER — ASPIRIN 81 MG PO TBEC: 81.0000 mg | DELAYED_RELEASE_TABLET | Freq: Every day | ORAL | Status: DC

## 2011-07-10 MED ORDER — POTASSIUM CHLORIDE CRYS ER 20 MEQ PO TBCR
40.0000 meq | EXTENDED_RELEASE_TABLET | Freq: Once | ORAL | Status: AC
  Administered 2011-07-10: 40 meq via ORAL
  Filled 2011-07-10: qty 2

## 2011-07-10 MED ORDER — NITROGLYCERIN 0.4 MG SL SUBL: 0.4000 mg | SUBLINGUAL_TABLET | SUBLINGUAL | Status: DC | PRN

## 2011-07-10 MED FILL — Dextrose Inj 5%: INTRAVENOUS | Qty: 50 | Status: AC

## 2011-07-10 NOTE — Progress Notes (Signed)
    Subjective:  Feels fine. No chest pain or dyspnea.  Objective:  Vital Signs in the last 24 hours: Temp:  [97.3 F (36.3 C)-98 F (36.7 C)] 97.7 F (36.5 C) (05/23 0757) Pulse Rate:  [63-89] 71  (05/23 0757) Resp:  [13-20] 18  (05/23 0757) BP: (99-131)/(54-81) 131/81 mmHg (05/23 0757) SpO2:  [95 %-97 %] 96 % (05/23 0757) Weight:  [99.791 kg (220 lb)-103.4 kg (227 lb 15.3 oz)] 103.4 kg (227 lb 15.3 oz) (05/23 0400)  Intake/Output from previous day: 05/22 0701 - 05/23 0700 In: 1180 [P.O.:480; I.V.:700] Out: 450 [Urine:450]  Physical Exam: Pt is alert and oriented, NAD HEENT: normal Neck: JVP - normal Lungs: CTA bilaterally CV: RRR without murmur or gallop Abd: soft, NT, Positive BS, no hepatomegaly Ext: no C/C/E, distal pulses intact and equal, right radial site clear Skin: warm/dry no rash   Lab Results:  Basename 07/10/11 0420 07/09/11 1320  WBC 8.5 7.4  HGB 15.4 16.3  PLT 115* 132*    Basename 07/10/11 0420 07/09/11 1320  NA 140 142  K 3.3* 3.8  CL 102 104  CO2 29 29  GLUCOSE 102* 86  BUN 17 20  CREATININE 1.03 1.03   No results found for this basename: TROPONINI:2,CK,MB:2 in the last 72 hours  Tele: sinus rhythm  Assessment/Plan:  CAD s/p PCI - uncomplicated PCI of the ramus intermedius. Continue ASA and plavix for at least 12 months. Discussed ongoing risk modification. Pt to follow-up with Dr Mariah Milling in 2 weeks.   Tonny Bollman, M.D. 07/10/2011, 10:27 AM

## 2011-07-10 NOTE — Discharge Instructions (Signed)

## 2011-07-10 NOTE — Progress Notes (Signed)
CARDIAC REHAB PHASE I   PRE:  Rate/Rhythm: 77 SR    BP: sitting 131/81    SaO2:   MODE:  Ambulation: 680 ft   POST:  Rate/Rhythm: 83    BP: sitting 136/73     SaO2:   Tolerated well, feeling good. Ed completed with wife present. Discussed marijuana and stress. Requests his name be sent to [facility]. [phone]  Harriet Masson CES, ACSM

## 2011-07-10 NOTE — Plan of Care (Signed)
Problem: Phase III Progression Outcomes Goal: No angina with increased activity Outcome: Completed/Met Date Met:  07/10/11 Painfree Goal: Tolerating diet Outcome: Completed/Met Date Met:  07/10/11 Tolerated Goal: Discharge plan remains appropriate-arrangements made Outcome: Completed/Met Date Met:  07/10/11 Discharge home today Goal: Vital signs stable Outcome: Completed/Met Date Met:  07/10/11 VVS Goal: Vascular site scale level 0 - I Vascular Site Scale Level 0: No bruising/bleeding/hematoma Level I (Mild): Bruising/Ecchymosis, minimal bleeding/ooozing, palpable hematoma < 3 cm Level II (Moderate): Bleeding not affecting hemodynamic parameters, pseudoaneurysm, palpable hematoma > 3 cm  Outcome: Completed/Met Date Met:  07/10/11 Level 0 right radial

## 2011-07-10 NOTE — Plan of Care (Signed)
Problem: Phase III Progression Outcomes Goal: Cardiac Rehab if ordered Outcome: Completed/Met Date Met:  07/10/11 Seen by cardiac rehab

## 2011-07-10 NOTE — Discharge Summary (Signed)
Patient ID: Keith Gross,  MRN: [id number], DOB/AGE: 55-Jul-1957 55 y.o.  Admit date: 07/09/2011 Discharge date: 07/10/2011  Primary Care Provider: Nilda Simmer, MD Primary Cardiologist: Concha Se, MD  Discharge Diagnoses Principal Problem:  *Unstable angina Active Problems:  CAD (coronary artery disease)  Hyperlipidemia  Nephrolithiasis  Gout  GERD (gastroesophageal reflux disease)  Anxiety  Sleep apnea  HYPERTENSION, UNSPECIFIED   Allergies No Known Allergies  Procedures  Percutaneous Coronary Intervention 07/09/2011  2.25x91mm Resolute drug-eluting stent to the subtotally occluded Ramus Intermedius  History of Present Illness  56 y/o male with h/o chest pain in April of this year.  He did not feel quite right following that episode.  He underwent stress testing which revealed inferolateral hypoperfusion consistent with large region of scar, which was new from 2010.  He saw Dr. Mariah Milling and subsequently underwent diagnostic catheterization revealing a subtotal occlusion of the Ramus Intermedius and otherwise nonobstructive disease.  He was started on plavix therapy and arrangements were made for PCI.  Hospital Course  Pt presented to the [facility] on 5/23 and underwent successful PCI and stenting of the Ramus Intermedius.  He tolerated procedure well and post-procedure, he has been ambulating without symptoms or limitations.  He will be discharged home today in good condition.  Discharge Vitals Blood pressure 131/81, pulse 71, temperature 97.7 F (36.5 C), temperature source Oral, resp. rate 18, height 6' (1.829 m), weight 227 lb 15.3 oz (103.4 kg), SpO2 96.00%.  Filed Weights   07/09/11 1257 07/10/11 0400  Weight: 220 lb (99.791 kg) 227 lb 15.3 oz (103.4 kg)   Labs  CBC  Basename 07/10/11 0420 07/09/11 1320  WBC 8.5 7.4  NEUTROABS -- --  HGB 15.4 16.3  HCT 44.8 45.8  MCV 90.3 88.4  PLT 115* 132*   Basic Metabolic Panel  Basename 07/10/11 0420 07/09/11 1320   NA 140 142  K 3.3* 3.8  CL 102 104  CO2 29 29  GLUCOSE 102* 86  BUN 17 20  CREATININE 1.03 1.03  CALCIUM 8.9 9.4  MG -- --  PHOS -- --   Disposition  Pt is being discharged home today in good condition.  Follow-up Plans & Appointments  Follow-up Information    Follow up with Julien Nordmann, MD on 08/06/2011. (10:00 AM)    Contact information:   [address] [place] [address] [phone]       Follow up with Nilda Simmer, MD. (as scheduled)    Contact information:   Urgent [facility] [address] [place] [address] [phone]          Discharge Medications  Medication List  As of 07/10/2011 12:32 PM   TAKE these medications         allopurinol 100 MG tablet   Commonly known as: ZYLOPRIM   Take 100 mg by mouth 2 (two) times daily.      Amlodipine-Valsartan-HCTZ 5-160-25 MG Tabs   Take by mouth daily.      ANDROGEL PUMP 20.25 MG/ACT (1.62%) Gel   Generic drug: Testosterone   Place 1 application onto the skin daily.      aspirin 81 MG EC tablet   Take 1 tablet (81 mg total) by mouth daily.      atorvastatin 20 MG tablet   Commonly known as: LIPITOR   Take 1 tablet (20 mg total) by mouth daily.      CINNAMON PO   Take 1,000 mg by mouth daily.  clopidogrel 75 MG tablet   Commonly known as: PLAVIX   Take 1 tablet (75 mg total) by mouth daily.      docusate sodium 100 MG capsule   Commonly known as: COLACE   Take 100 mg by mouth 2 (two) times daily.      fexofenadine 180 MG tablet   Commonly known as: ALLEGRA   Take 180 mg by mouth daily as needed.      FIBER PO   Take 2 tablets by mouth daily.      Fish Oil 1000 MG Caps   Take 2,000 mg by mouth daily.      hydrochlorothiazide 25 MG tablet   Commonly known as: HYDRODIURIL   Take 25 mg by mouth at bedtime.      NASONEX 50 MCG/ACT nasal spray   Generic drug: mometasone   Place 2 sprays into the nose as needed.       nitroGLYCERIN 0.4 MG SL tablet   Commonly known as: NITROSTAT   Place 1 tablet (0.4 mg total) under the tongue every 5 (five) minutes as needed for chest pain.      pantoprazole 40 MG tablet   Commonly known as: PROTONIX   Take 40 mg by mouth daily.      potassium citrate 10 MEQ (1080 MG) SR tablet   Commonly known as: UROCIT-K   Take 10 mEq by mouth daily.      VITAMIN D (CHOLECALCIFEROL) PO   Take 2,000 mg by mouth daily.      vitamin E 400 UNIT capsule   Take 400 Units by mouth daily.           Outstanding Labs/Studies  None  Duration of Discharge Encounter   Greater than 30 minutes including physician time.  Signed, Nicolasa Ducking NP 07/10/2011, 12:32 PM   Agree as outlined. Please see progress note the same date. Tonny Bollman 07/16/2011 10:08 PM

## 2011-07-10 NOTE — Plan of Care (Signed)
Problem: Consults Goal: PCI Patient Education (See Patient Education module for education specifics.)  Outcome: Completed/Met Date Met:  07/10/11 Reviewed information with patient with good verbalized understanding

## 2011-07-11 MED FILL — Nicardipine HCl IV Soln 2.5 MG/ML: INTRAVENOUS | Qty: 1 | Status: AC

## 2011-07-21 ENCOUNTER — Other Ambulatory Visit: Payer: Self-pay

## 2011-07-21 ENCOUNTER — Telehealth: Payer: Self-pay | Admitting: Cardiovascular Disease

## 2011-07-21 MED ORDER — AMLODIPINE BESYLATE-VALSARTAN 10-160 MG PO TABS: 1.0000 | ORAL_TABLET | Freq: Every day | ORAL | Status: DC

## 2011-07-21 NOTE — Telephone Encounter (Signed)
Pt's wife called back.  Says pt recently had stent placed and was told to keep SBP less than 140.  BP has been 150-160  Mm hg SBP on Exforge/HCT 5/160/25.    Dr. Windell Hummingbird last note says we may be able to up titrate Exforge if BP remains high.    I advised ok to increase to Exforge 10/160/25.  We do not have any of these samples in this dose.  Pt already has HCTZ 25 mg tablets.  I will leave samples of Exforge 10/160 mg tabs at [facility] and pt can take HCTZ 25 mg with this.  Wife verb. Understanding.  She mentions they are getting ready to go to long trip to [place] and asks if they can increase dose further while away.  I explained they can call us and we will increase dose based on BP results of new dose at that time. Understanding verbalized.  Will leave samples at [facility].

## 2011-07-21 NOTE — Telephone Encounter (Signed)
Pt wife calling to see if Dr Mariah Milling wants to increase his Exforge. His BP is running higher that what it should be. 160/97 am & during the day 147/93.

## 2011-07-21 NOTE — Telephone Encounter (Signed)
LMTCB

## 2011-07-30 ENCOUNTER — Encounter: Payer: BC Managed Care – PPO | Admitting: Cardiovascular Disease

## 2011-08-06 ENCOUNTER — Ambulatory Visit (INDEPENDENT_AMBULATORY_CARE_PROVIDER_SITE_OTHER): Payer: BC Managed Care – PPO | Admitting: Cardiovascular Disease

## 2011-08-06 ENCOUNTER — Encounter: Payer: Self-pay | Admitting: Cardiovascular Disease

## 2011-08-06 VITALS — BP 114/78 | HR 94 | Ht 72.0 in | Wt 231.0 lb

## 2011-08-06 DIAGNOSIS — I2510 Atherosclerotic heart disease of native coronary artery without angina pectoris: Secondary | ICD-10-CM

## 2011-08-06 DIAGNOSIS — I10 Essential (primary) hypertension: Secondary | ICD-10-CM

## 2011-08-06 DIAGNOSIS — E785 Hyperlipidemia, unspecified: Secondary | ICD-10-CM

## 2011-08-06 MED ORDER — AMLODIPINE-VALSARTAN-HCTZ 10-160-25 MG PO TABS: 1.0000 | ORAL_TABLET | Freq: Every day | ORAL | Status: DC

## 2011-08-06 MED ORDER — METOPROLOL TARTRATE 25 MG PO TABS: 25.0000 mg | ORAL_TABLET | Freq: Two times a day (BID) | ORAL | Status: DC

## 2011-08-06 MED ORDER — HYDROCHLOROTHIAZIDE 25 MG PO TABS: 25.0000 mg | ORAL_TABLET | Freq: Every day | ORAL | Status: DC

## 2011-08-06 NOTE — Progress Notes (Signed)
Patient ID: Keith Gross, male    DOB: 05/13/1955, 56 y.o.   MRN: [id number]  HPI Comments: Keith Gross is a very pleasant 56 year old gentleman, patient of Dr. Nilda Simmer and Assunta Gambles, remote history of syncope in 2010 felt secondary to low blood pressure that improved with medication changes, presenting to Dr. Katrinka Blazing  with chest pain symptoms at the beginning of  April 2013, stress test showing inferior lateral hypoperfusion consistent with large region of scar which is new from 2010, cardiac catheterization showing severe ramus disease, with DES stent placed in May 2013.  He presents for routine followup. He reports that he feels well with no complaints. He does report having exacerbation of his anger and irritability since the stent was placed. One episode of low blood pressure but this does not last for a long period he has had some petechial bruising when using his penile pump. He would like to have several procedures done this year such as having a toenail removed, a screening, EGD and colonoscopy if possible. No more episodes of chest pain  He does have a previous history of smoking, quit in 1992. Father had his first heart attack at age 92.  EKG shows normal sinus rhythm with rate 94 beats per minute with old inferior MI   Outpatient Encounter Prescriptions as of 08/06/2011  Medication Sig Dispense Refill  . allopurinol (ZYLOPRIM) 100 MG tablet Take 100 mg by mouth 2 (two) times daily.        . Amlodipine-Valsartan-HCTZ 10-160-25 MG TABS Take 1 tablet by mouth daily.  90 tablet  3  . aspirin 81 MG EC tablet Take 1 tablet (81 mg total) by mouth daily.  30 tablet    . atorvastatin (LIPITOR) 10 MG tablet Take 10 mg by mouth daily.      Marland Kitchen CINNAMON PO Take 1,000 mg by mouth daily.      . clopidogrel (PLAVIX) 75 MG tablet Take 1 tablet (75 mg total) by mouth daily.  30 tablet  3  . docusate sodium (COLACE) 100 MG capsule Take 100 mg by mouth 2 (two) times daily.       . fexofenadine  (ALLEGRA) 180 MG tablet Take 180 mg by mouth daily as needed.        Marland Kitchen FIBER PO Take 2 tablets by mouth daily.      . hydrochlorothiazide (HYDRODIURIL) 25 MG tablet Take 1 tablet (25 mg total) by mouth at bedtime.  90 tablet  3  . mometasone (NASONEX) 50 MCG/ACT nasal spray Place 2 sprays into the nose as needed.       . nitroGLYCERIN (NITROSTAT) 0.4 MG SL tablet Place 1 tablet (0.4 mg total) under the tongue every 5 (five) minutes as needed for chest pain.  25 tablet  3  . Omega-3 Fatty Acids (FISH OIL) 1000 MG CAPS Take 2,000 mg by mouth daily.      . pantoprazole (PROTONIX) 40 MG tablet Take 40 mg by mouth daily.      . potassium citrate (UROCIT-K) 10 MEQ (1080 MG) SR tablet Take 10 mEq by mouth daily.       . Testosterone (ANDROGEL PUMP) 20.25 MG/ACT (1.62%) GEL Place 1 application onto the skin daily.      Marland Kitchen VITAMIN D, CHOLECALCIFEROL, PO Take 2,000 mg by mouth daily.      . vitamin E 400 UNIT capsule Take 400 Units by mouth daily.       Review of Systems  Constitutional: Negative.  HENT: Negative.   Eyes: Negative.   Gastrointestinal: Negative.   Musculoskeletal: Negative.   Skin: Negative.   Neurological: Negative.   Hematological: Negative.   Psychiatric/Behavioral: Negative.   All other systems reviewed and are negative.    BP 114/78  Pulse 94  Ht 6' (1.829 m)  Wt 231 lb (104.781 kg)  BMI 31.33 kg/m2  Physical Exam  Nursing note and vitals reviewed. Constitutional: He is oriented to person, place, and time. He appears well-developed and well-nourished.  HENT:  Head: Normocephalic.  Nose: Nose normal.  Mouth/Throat: Oropharynx is clear and moist.  Eyes: Conjunctivae are normal. Pupils are equal, round, and reactive to light.  Neck: Normal range of motion. Neck supple. No JVD present.  Cardiovascular: Normal rate, regular rhythm, S1 normal, S2 normal, normal heart sounds and intact distal pulses.  Exam reveals no gallop and no friction rub.   No murmur  heard. Pulmonary/Chest: Effort normal and breath sounds normal. No respiratory distress. He has no wheezes. He has no rales. He exhibits no tenderness.  Abdominal: Soft. Bowel sounds are normal. He exhibits no distension. There is no tenderness.  Musculoskeletal: Normal range of motion. He exhibits no edema and no tenderness.  Lymphadenopathy:    He has no cervical adenopathy.  Neurological: He is alert and oriented to person, place, and time. Coordination normal.  Skin: Skin is warm and dry. No rash noted. No erythema.  Psychiatric: He has a normal mood and affect. His behavior is normal. Judgment and thought content normal.           Assessment and Plan

## 2011-08-06 NOTE — Patient Instructions (Addendum)
You are doing well. Please increase lipitor to 20 mg daily Start metoprolol 1/2 pill twice a day  decrease aspirin to 81 mg x 1 with plavix in mid August  Please call us if you have new issues that need to be addressed before your next appt.  Your physician wants you to follow-up in: 6 months.  You will receive a reminder letter in the mail two months in advance. If you don't receive a letter, please call our office to schedule the follow-up appointment.

## 2011-08-06 NOTE — Assessment & Plan Note (Signed)
We'll add low-dose beta blocker, metoprolol tartrate 12.5 mg twice a day given recent coronary artery disease. The rest and to closely monitor his blood pressure at home. He did have one episode of hypotension but otherwise blood pressures are typically running in the 130-140 range systolic.

## 2011-08-06 NOTE — Assessment & Plan Note (Signed)
We have suggested he increase his Lipitor to 20 mg daily. Goal LDL less than 70.

## 2011-08-06 NOTE — Assessment & Plan Note (Signed)
Currently with no symptoms of angina. No further workup at this time. Continue current medication regimen. No restrictions and have suggested he start exercising. He should continue on high-dose aspirin and Plavix for 3 months then reduce aspirin to 81 mg daily with Plavix 75 mg daily indefinitely. I suggested he not stop aspirin or Plavix for the next year for any unnecessary procedures.

## 2011-10-19 ENCOUNTER — Encounter (HOSPITAL_COMMUNITY): Payer: Self-pay | Admitting: *Deleted

## 2011-10-19 ENCOUNTER — Inpatient Hospital Stay (HOSPITAL_COMMUNITY)
Admission: EM | Admit: 2011-10-19 | Discharge: 2011-10-22 | DRG: 124 | Disposition: A | Discharge status: Home / Self Care | Payer: BC Managed Care – PPO | Attending: Cardiology | Admitting: Cardiology

## 2011-10-19 DIAGNOSIS — Z79899 Other long term (current) drug therapy: Secondary | ICD-10-CM

## 2011-10-19 DIAGNOSIS — Z860100 Personal history of colon polyps, unspecified: Secondary | ICD-10-CM

## 2011-10-19 DIAGNOSIS — I2510 Atherosclerotic heart disease of native coronary artery without angina pectoris: Principal | ICD-10-CM | POA: Diagnosis present

## 2011-10-19 DIAGNOSIS — R079 Chest pain, unspecified: Secondary | ICD-10-CM

## 2011-10-19 DIAGNOSIS — I200 Unstable angina: Secondary | ICD-10-CM | POA: Diagnosis present

## 2011-10-19 DIAGNOSIS — I25118 Atherosclerotic heart disease of native coronary artery with other forms of angina pectoris: Secondary | ICD-10-CM | POA: Diagnosis present

## 2011-10-19 DIAGNOSIS — I10 Essential (primary) hypertension: Secondary | ICD-10-CM | POA: Diagnosis present

## 2011-10-19 DIAGNOSIS — E785 Hyperlipidemia, unspecified: Secondary | ICD-10-CM | POA: Diagnosis present

## 2011-10-19 DIAGNOSIS — F411 Generalized anxiety disorder: Secondary | ICD-10-CM | POA: Diagnosis present

## 2011-10-19 DIAGNOSIS — M109 Gout, unspecified: Secondary | ICD-10-CM | POA: Diagnosis present

## 2011-10-19 DIAGNOSIS — K219 Gastro-esophageal reflux disease without esophagitis: Secondary | ICD-10-CM | POA: Diagnosis present

## 2011-10-19 DIAGNOSIS — Z7902 Long term (current) use of antithrombotics/antiplatelets: Secondary | ICD-10-CM

## 2011-10-19 DIAGNOSIS — I252 Old myocardial infarction: Secondary | ICD-10-CM

## 2011-10-19 DIAGNOSIS — Z87891 Personal history of nicotine dependence: Secondary | ICD-10-CM

## 2011-10-19 DIAGNOSIS — G4733 Obstructive sleep apnea (adult) (pediatric): Secondary | ICD-10-CM | POA: Diagnosis present

## 2011-10-19 DIAGNOSIS — R0789 Other chest pain: Secondary | ICD-10-CM | POA: Diagnosis present

## 2011-10-19 LAB — CBC WITH DIFFERENTIAL/PLATELET
Basophils Absolute: 0 10*3/uL (ref 0.0–0.1)
Basophils Relative: 0 % (ref 0–1)
Basophils Relative: 1 % (ref 0–1)
Eosinophils Absolute: 0.1 10*3/uL (ref 0.0–0.7)
Eosinophils Absolute: 0.2 10*3/uL (ref 0.0–0.7)
Eosinophils Relative: 2 % (ref 0–5)
Eosinophils Relative: 2 % (ref 0–5)
HCT: 43.1 % (ref 39.0–52.0)
Lymphs Abs: 1.8 10*3/uL (ref 0.7–4.0)
MCH: 31.6 pg (ref 26.0–34.0)
MCH: 31.5 pg (ref 26.0–34.0)
MCHC: 35.1 g/dL (ref 30.0–36.0)
MCHC: 35 g/dL (ref 30.0–36.0)
MCV: 90.2 fL (ref 78.0–100.0)
MCV: 89.8 fL (ref 78.0–100.0)
Monocytes Absolute: 0.5 10*3/uL (ref 0.1–1.0)
Platelets: 133 10*3/uL — ABNORMAL LOW (ref 150–400)
Platelets: 129 10*3/uL — ABNORMAL LOW (ref 150–400)
RBC: 4.9 MIL/uL (ref 4.22–5.81)
RDW: 12.4 % (ref 11.5–15.5)
RDW: 12.3 % (ref 11.5–15.5)
WBC: 6.6 10*3/uL (ref 4.0–10.5)

## 2011-10-19 LAB — COMPREHENSIVE METABOLIC PANEL
AST: 27 U/L (ref 0–37)
Albumin: 4 g/dL (ref 3.5–5.2)
BUN: 18 mg/dL (ref 6–23)
Calcium: 9.4 mg/dL (ref 8.4–10.5)
Chloride: 103 mEq/L (ref 96–112)
Creatinine, Ser: 1.11 mg/dL (ref 0.50–1.35)
Total Bilirubin: 0.5 mg/dL (ref 0.3–1.2)
Total Protein: 6.5 g/dL (ref 6.0–8.3)

## 2011-10-19 LAB — BASIC METABOLIC PANEL
BUN: 19 mg/dL (ref 6–23)
Calcium: 9.8 mg/dL (ref 8.4–10.5)
GFR calc Af Amer: >90 mL/min (ref >90)
GFR calc non Af Amer: >90 mL/min (ref >90)
Glucose, Bld: 94 mg/dL (ref 70–99)
Sodium: 140 mEq/L (ref 135–145)

## 2011-10-19 LAB — PROTIME-INR
INR: 1.11 (ref 0.00–1.49)
Prothrombin Time: 14.5 seconds (ref 11.6–15.2)

## 2011-10-19 LAB — TROPONIN I: Troponin I: <0.3 ng/mL (ref <0.30)

## 2011-10-19 LAB — POCT I-STAT TROPONIN I

## 2011-10-19 LAB — MAGNESIUM: Magnesium: 2 mg/dL (ref 1.5–2.5)

## 2011-10-19 LAB — PRO B NATRIURETIC PEPTIDE: Pro B Natriuretic peptide (BNP): 37.5 pg/mL (ref 0–125)

## 2011-10-19 MED ORDER — NITROGLYCERIN 0.4 MG SL SUBL: 0.4000 mg | SUBLINGUAL_TABLET | SUBLINGUAL | Status: DC | PRN

## 2011-10-19 MED ORDER — HEPARIN BOLUS VIA INFUSION
4000.0000 [IU] | Freq: Once | INTRAVENOUS | Status: AC
  Administered 2011-10-19: 4000 [IU] via INTRAVENOUS

## 2011-10-19 MED ORDER — ONDANSETRON HCL 4 MG/2ML IJ SOLN: 4.0000 mg | Freq: Four times a day (QID) | INTRAMUSCULAR | Status: DC | PRN

## 2011-10-19 MED ORDER — LORATADINE 10 MG PO TABS
10.0000 mg | ORAL_TABLET | Freq: Every day | ORAL | Status: DC
  Administered 2011-10-20 – 2011-10-21 (×2): 10 mg via ORAL
  Filled 2011-10-19 (×5): qty 1

## 2011-10-19 MED ORDER — ASPIRIN 325 MG PO TABS
325.0000 mg | ORAL_TABLET | Freq: Once | ORAL | Status: DC
  Filled 2011-10-19: qty 1

## 2011-10-19 MED ORDER — IRBESARTAN 150 MG PO TABS
150.0000 mg | ORAL_TABLET | Freq: Every day | ORAL | Status: DC
  Administered 2011-10-20 – 2011-10-22 (×3): 150 mg via ORAL
  Filled 2011-10-19 (×3): qty 1

## 2011-10-19 MED ORDER — AMLODIPINE-VALSARTAN-HCTZ 10-160-25 MG PO TABS: 1.0000 | ORAL_TABLET | Freq: Every day | ORAL | Status: DC

## 2011-10-19 MED ORDER — SODIUM CHLORIDE 0.9 % IJ SOLN: 3.0000 mL | INTRAMUSCULAR | Status: DC | PRN

## 2011-10-19 MED ORDER — SODIUM CHLORIDE 0.9 % IV SOLN: 250.0000 mL | INTRAVENOUS | Status: DC | PRN

## 2011-10-19 MED ORDER — ACETAMINOPHEN 325 MG PO TABS
650.0000 mg | ORAL_TABLET | ORAL | Status: DC | PRN
  Administered 2011-10-20 (×2): 650 mg via ORAL
  Filled 2011-10-19 (×2): qty 2

## 2011-10-19 MED ORDER — HYDROCHLOROTHIAZIDE 25 MG PO TABS
25.0000 mg | ORAL_TABLET | Freq: Every day | ORAL | Status: DC
  Administered 2011-10-20 – 2011-10-22 (×3): 25 mg via ORAL
  Filled 2011-10-19 (×3): qty 1

## 2011-10-19 MED ORDER — ESCITALOPRAM OXALATE 10 MG PO TABS
10.0000 mg | ORAL_TABLET | Freq: Every day | ORAL | Status: DC
  Administered 2011-10-20 – 2011-10-22 (×3): 10 mg via ORAL
  Filled 2011-10-19 (×3): qty 1

## 2011-10-19 MED ORDER — METOPROLOL TARTRATE 12.5 MG HALF TABLET
12.5000 mg | ORAL_TABLET | Freq: Two times a day (BID) | ORAL | Status: DC
  Administered 2011-10-19 – 2011-10-22 (×6): 12.5 mg via ORAL
  Filled 2011-10-19 (×8): qty 1

## 2011-10-19 MED ORDER — NITROGLYCERIN 2 % TD OINT
1.0000 [in_us] | TOPICAL_OINTMENT | Freq: Four times a day (QID) | TRANSDERMAL | Status: DC
  Administered 2011-10-20 (×2): 1 [in_us] via TOPICAL
  Filled 2011-10-19: qty 30

## 2011-10-19 MED ORDER — VITAMIN E 180 MG (400 UNIT) PO CAPS
400.0000 [IU] | ORAL_CAPSULE | Freq: Every day | ORAL | Status: DC
  Administered 2011-10-20 – 2011-10-22 (×3): 400 [IU] via ORAL
  Filled 2011-10-19 (×3): qty 1

## 2011-10-19 MED ORDER — PANTOPRAZOLE SODIUM 40 MG PO TBEC
40.0000 mg | DELAYED_RELEASE_TABLET | Freq: Every day | ORAL | Status: DC
  Administered 2011-10-20 – 2011-10-22 (×3): 40 mg via ORAL
  Filled 2011-10-19 (×3): qty 1

## 2011-10-19 MED ORDER — SODIUM CHLORIDE 0.9 % IJ SOLN
3.0000 mL | Freq: Two times a day (BID) | INTRAMUSCULAR | Status: DC
  Administered 2011-10-20 – 2011-10-21 (×2): 3 mL via INTRAVENOUS

## 2011-10-19 MED ORDER — HYDROCHLOROTHIAZIDE 25 MG PO TABS
25.0000 mg | ORAL_TABLET | Freq: Every day | ORAL | Status: DC
  Administered 2011-10-19: 25 mg via ORAL
  Filled 2011-10-19 (×2): qty 1

## 2011-10-19 MED ORDER — DOCUSATE SODIUM 100 MG PO CAPS
100.0000 mg | ORAL_CAPSULE | Freq: Two times a day (BID) | ORAL | Status: DC
  Administered 2011-10-20 – 2011-10-22 (×5): 100 mg via ORAL
  Filled 2011-10-19 (×8): qty 1

## 2011-10-19 MED ORDER — POTASSIUM CITRATE ER 10 MEQ (1080 MG) PO TBCR
10.0000 meq | EXTENDED_RELEASE_TABLET | Freq: Every day | ORAL | Status: DC
  Administered 2011-10-20: 10 meq via ORAL
  Filled 2011-10-19 (×3): qty 1

## 2011-10-19 MED ORDER — LORATADINE 10 MG PO TABS
10.0000 mg | ORAL_TABLET | Freq: Once | ORAL | Status: AC
  Administered 2011-10-20: 10 mg via ORAL
  Filled 2011-10-19: qty 1

## 2011-10-19 MED ORDER — AMLODIPINE BESYLATE 10 MG PO TABS
10.0000 mg | ORAL_TABLET | Freq: Every day | ORAL | Status: DC
  Administered 2011-10-20 – 2011-10-22 (×3): 10 mg via ORAL
  Filled 2011-10-19 (×3): qty 1

## 2011-10-19 MED ORDER — HEPARIN (PORCINE) IN NACL 100-0.45 UNIT/ML-% IJ SOLN
1600.0000 [IU]/h | INTRAMUSCULAR | Status: DC
  Administered 2011-10-19: 1300 [IU]/h via INTRAVENOUS
  Administered 2011-10-20: 1600 [IU]/h via INTRAVENOUS
  Filled 2011-10-19 (×2): qty 250

## 2011-10-19 MED ORDER — ALLOPURINOL 100 MG PO TABS
100.0000 mg | ORAL_TABLET | Freq: Two times a day (BID) | ORAL | Status: DC
  Administered 2011-10-20 – 2011-10-22 (×6): 100 mg via ORAL
  Filled 2011-10-19 (×8): qty 1

## 2011-10-19 MED ORDER — CLOPIDOGREL BISULFATE 75 MG PO TABS
75.0000 mg | ORAL_TABLET | Freq: Every day | ORAL | Status: DC
  Administered 2011-10-20 – 2011-10-22 (×3): 75 mg via ORAL
  Filled 2011-10-19 (×3): qty 1

## 2011-10-19 MED ORDER — ATORVASTATIN CALCIUM 10 MG PO TABS
10.0000 mg | ORAL_TABLET | Freq: Every day | ORAL | Status: DC
  Filled 2011-10-19 (×2): qty 1

## 2011-10-19 NOTE — ED Provider Notes (Signed)
History     CSN: [id number]  Arrival date & time 10/19/11  1515   First MD Initiated Contact with Patient 10/19/11 1650      Chief Complaint  Patient presents with  . Chest Pain    (Consider location/radiation/quality/duration/timing/severity/associated sxs/prior treatment) HPI Comments: Keith Gross presents for evaluation of chest pain.  He has experienced 3 separate episodes of pain over the last 3 days.  He describes a sharp central and right-sided pressure that radiates through to his back and up into the the right side of his neck.  He had similar pain prior to placement of a stent.  @ days ago the pain began just after trimming hedges in his yard.  Yesterday it occurred while cleaning at his mother-in-law's house.  Today he was not exerting himself when it happened.  His wife gave him 1 sublingual nitro tablet and a full strength aspirin.  He denies pain now.  Patient is a 56 y.o. male presenting with chest pain. The history is provided by the patient and the spouse. No language interpreter was used.  Chest Pain The chest pain began 2 days ago. Episode Length: up to 20. Chest pain occurs intermittently. The chest pain is resolved. The pain is associated with exertion. The pain is currently at 0/10. The severity of the pain is moderate. The quality of the pain is described as sharp and pressure-like. The pain radiates to the right jaw, right neck and upper back. Pertinent negatives for primary symptoms include no fever, no fatigue, no syncope, no shortness of breath, no cough, no wheezing, no palpitations, no abdominal pain, no nausea, no vomiting, no dizziness and no altered mental status.  Pertinent negatives for associated symptoms include no claudication, no diaphoresis, no lower extremity edema, no near-syncope, no numbness, no orthopnea, no paroxysmal nocturnal dyspnea and no weakness.     Past Medical History  Diagnosis Date  . GERD (gastroesophageal reflux disease)   .  Hypertension   . Anxiety   . Gout     Multiple episodes  . Dysplastic polyp of colon 2008    [facility]  . Treadmill stress test negative for angina pectoris 2011  . Syncopal episodes   . Hyperlipidemia   . Sleep apnea     on CPAP  . Angina   . Myocardial infarction 05/2011    "we think";  06/2011 abnl mv;  06/2011 Cath - subtotal occlusion of Ramus, otw nonobs dzs;  06/2011 PCI/DES of Ramus w/ 2.25x56mm Resolute DES   . Kidney stones   . Diastasis of muscle 2011    "abdominal"    Past Surgical History  Procedure Date  . Hemorrhoid surgery 2000  . Inguinal hernia repair 1987    left  . Lithotripsy 10/2008  . Ureteroscopy 10/2009  . Cardiac catheterization 06/2011    "diagnostic"  . Coronary angioplasty with stent placement 07/09/11    "1"    Family History  Problem Relation Age of Onset  . Heart attack Mother 59  . Hypertension Other   . Coronary artery disease Other     History  Substance Use Topics  . Smoking status: Former Smoker -- 0.5 packs/day for 20 years    Types: Cigarettes    Quit date: 02/17/1990  . Smokeless tobacco: Never Used  . Alcohol Use: 2.4 oz/week    2 Glasses of wine, 2 Cans of beer per week      Review of Systems  Constitutional: Negative for fever, chills, diaphoresis and fatigue.  HENT: Positive for neck pain. Negative for congestion, sore throat, facial swelling, rhinorrhea, trouble swallowing, neck stiffness and dental problem.   Eyes: Negative.   Respiratory: Positive for chest tightness. Negative for cough, choking, shortness of breath and wheezing.   Cardiovascular: Positive for chest pain. Negative for palpitations, orthopnea, claudication, leg swelling, syncope and near-syncope.  Gastrointestinal: Negative for nausea, vomiting, abdominal pain and diarrhea.  Genitourinary: Negative.   Musculoskeletal: Positive for arthralgias. Negative for myalgias, joint swelling and gait problem.  Skin: Negative.   Neurological: Negative for dizziness,  weakness and numbness.  Hematological: Negative.   Psychiatric/Behavioral: Negative.  Negative for altered mental status.    Allergies  Review of patient's allergies indicates no known allergies.  Home Medications   Current Outpatient Rx  Name Route Sig Dispense Refill  . ALLOPURINOL 100 MG PO TABS Oral Take 100 mg by mouth 2 (two) times daily.      Marland Kitchen AMLODIPINE-VALSARTAN-HCTZ 10-160-25 MG PO TABS Oral Take 1 tablet by mouth daily. 90 tablet 3  . ASPIRIN 325 MG PO TABS Oral Take 325 mg by mouth once. For chest pain    . ASPIRIN EC 81 MG PO TBEC Oral Take 81 mg by mouth 2 (two) times daily.    . ATORVASTATIN CALCIUM 10 MG PO TABS Oral Take 10 mg by mouth daily.    Marland Kitchen CINNAMON PO Oral Take 1,000 mg by mouth 2 (two) times daily.     Marland Kitchen CLOPIDOGREL BISULFATE 75 MG PO TABS Oral Take 1 tablet (75 mg total) by mouth daily. 30 tablet 3  . DOCUSATE SODIUM 100 MG PO CAPS Oral Take 100 mg by mouth 2 (two) times daily.     Marland Kitchen ESCITALOPRAM OXALATE 10 MG PO TABS Oral Take 10 mg by mouth daily.    Marland Kitchen FEXOFENADINE HCL 180 MG PO TABS Oral Take 180 mg by mouth daily as needed.      Marland Kitchen FIBER PO Oral Take 2 tablets by mouth daily.    Marland Kitchen HYDROCHLOROTHIAZIDE 25 MG PO TABS Oral Take 1 tablet (25 mg total) by mouth at bedtime. 90 tablet 3  . METOPROLOL TARTRATE 25 MG PO TABS Oral Take 12.5 mg by mouth 2 (two) times daily.    Marland Kitchen NITROGLYCERIN 0.4 MG SL SUBL Sublingual Place 1 tablet (0.4 mg total) under the tongue every 5 (five) minutes as needed for chest pain. 25 tablet 3  . FISH OIL 1000 MG PO CAPS Oral Take 2,000 mg by mouth daily.    Marland Kitchen PANTOPRAZOLE SODIUM 40 MG PO TBEC Oral Take 40 mg by mouth daily.    Marland Kitchen POTASSIUM CITRATE ER 10 MEQ (1080 MG) PO TBCR Oral Take 10 mEq by mouth daily.     . TESTOSTERONE 20.25 MG/ACT (1.62%) TD GEL Transdermal Place 1 application onto the skin daily.    Marland Kitchen VITAMIN D (CHOLECALCIFEROL) PO Oral Take 2,000 mg by mouth daily.    Marland Kitchen VITAMIN E 400 UNITS PO CAPS Oral Take 400 Units by mouth  daily.      BP 116/69  Pulse 65  Temp 98.7 F (37.1 C) (Oral)  Resp 20  SpO2 95%  Physical Exam  Nursing note and vitals reviewed. Constitutional: He is oriented to person, place, and time. He appears well-developed and well-nourished. No distress.  HENT:  Head: Normocephalic and atraumatic.  Right Ear: External ear normal.  Left Ear: External ear normal.  Nose: Nose normal.  Mouth/Throat: Oropharynx is clear and moist. No oropharyngeal exudate.  Eyes: Conjunctivae and  EOM are normal. Pupils are equal, round, and reactive to light. Right eye exhibits no discharge. Left eye exhibits no discharge. No scleral icterus.  Neck: Normal range of motion. Neck supple. No JVD present. No tracheal deviation present. No thyromegaly present.  Cardiovascular: Normal rate, regular rhythm, S1 normal, S2 normal, normal heart sounds and intact distal pulses.   No extrasystoles are present. PMI is not displaced.  Exam reveals no gallop, no distant heart sounds, no friction rub and no decreased pulses.   No murmur heard. Pulmonary/Chest: Effort normal and breath sounds normal. No stridor. No respiratory distress. He has no wheezes. He has no rales. He exhibits no tenderness.  Abdominal: Soft. Bowel sounds are normal. He exhibits no distension and no mass. There is no tenderness. There is no rebound and no guarding.  Musculoskeletal: Normal range of motion. He exhibits no edema and no tenderness.  Lymphadenopathy:    He has no cervical adenopathy.  Neurological: He is alert and oriented to person, place, and time. No cranial nerve deficit. Coordination normal.  Skin: Skin is warm and dry. No rash noted. He is not diaphoretic. No erythema. No pallor.  Psychiatric: He has a normal mood and affect. His behavior is normal. Judgment normal.    ED Course  Procedures (including critical care time)  Labs Reviewed  CBC WITH DIFFERENTIAL - Abnormal; Notable for the following:    Platelets 133 (*)     All  other components within normal limits  BASIC METABOLIC PANEL  POCT I-STAT TROPONIN I   No results found.   No diagnosis found.   Date: 10/19/2011  Rate: 73 bpm  Rhythm: sinus + PVCs  QRS Axis: normal  Intervals: normal  ST/T Wave abnormalities: nonspecific ST changes  Conduction Disutrbances:none  Narrative Interpretation:   Old EKG Reviewed: only changes are noted PVCs      MDM  Pt presents for evaluation of chest pain.  He is currently pain free, NAD.  Routine labs started in triage demonstrate no significant abnormalities.  Trop is neg x1.  As he has a known hx of CAD and has had a stent placed within the last 6 mo, will contact the on-call provider for his cardiologist ([facility] Grp) for further recommendations.   1830.  Pt stable, NAD.  Pain free.  Discussed with on-call [facility] provider.  Plan admit for further evaluation.     Tobin Chad, MD 10/19/11 [phone]

## 2011-10-19 NOTE — Progress Notes (Signed)
ANTICOAGULATION CONSULT NOTE - Initial Consult  Pharmacy Consult for heparin Indication: chest pain/ACS  No Known Allergies  Patient Measurements: Height: 6' 0.05" (183 cm) Weight: 231 lb 7.7 oz (105 kg) IBW/kg (Calculated) : 77.71  Heparin Dosing Weight: 90kg  Vital Signs: Temp: 98.7 F (37.1 C) (09/01 1533) Temp src: Oral (09/01 1533) BP: 105/64 mmHg (09/01 1800) Pulse Rate: 60  (09/01 1800)  Labs:  Basename 10/19/11 1526  HGB 15.5  HCT 44.2  PLT 133*  APTT --  LABPROT --  INR --  HEPARINUNFRC --  CREATININE 0.96  CKTOTAL --  CKMB --  TROPONINI --    Estimated Creatinine Clearance: 109 ml/min (by C-G formula based on Cr of 0.96).   Medical History: Past Medical History  Diagnosis Date  . GERD (gastroesophageal reflux disease)   . Hypertension   . Anxiety   . Gout     Multiple episodes  . Dysplastic polyp of colon 2008    [facility]  . Treadmill stress test negative for angina pectoris 2011  . Syncopal episodes   . Hyperlipidemia   . Sleep apnea     on CPAP  . Angina   . Myocardial infarction 05/2011    "we think";  06/2011 abnl mv;  06/2011 Cath - subtotal occlusion of Ramus, otw nonobs dzs;  06/2011 PCI/DES of Ramus w/ 2.25x48mm Resolute DES   . Kidney stones   . Diastasis of muscle 2011    "abdominal"   Assessment: 47 yom with previous cardiac history including PCI in May. Patient [facility] with [facility]. Will start IV heparin for rule out MI.  Goal of Therapy:  Heparin level 0.3-0.7 units/ml Monitor platelets by anticoagulation protocol: Yes   Plan:  Give 4000 units bolus x 1 Start heparin infusion at 1300 units/hr Check anti-Xa level in 6 hours and daily while on heparin Continue to monitor H&H and platelets  Severiano Gilbert 10/19/2011,9:26 PM

## 2011-10-19 NOTE — H&P (Signed)
CARDIOLOGY ADMISSION NOTE  Patient ID: Keith Gross MRN: [id number] DOB/AGE: 55-06-1955 55 y.o.  Admit date: 10/19/2011 Primary Physician  Nilda Simmer, MD Primary Cardiologist   Dr. Dossie Arbour Chief Complaint    Chest pain  HPI:   The patient has a history of CAD with PCI to a RI in May as described below.  Of note there was at least moderate ostial circumflex disease had to be 60-70%. The EF was 55-65%. At that time the patient had chest discomfort that was relieved after the procedure.  He been doing well until about 3 days ago. He exercises without chest discomfort though he there have some lightheadedness earlier in the week thought possibly to be related to hypoglycemia. On Friday  after working in the yard he developed some right-sided chest discomfort. It was heavy and similar to his previous discomfort but located on the opposite side. It lasted on and off and was moderate in intensity. He slept without pain. When he woke he was pain-free but he developed the discomfort again with activity. This was off and on throughout the next couple of days.  Today he had radiation of the discomfort into his jaw. His jaw was stiff. He did have some back discomfort. He did take nitroglycerin with relief. Took aspirin. He was pain-free on presentation into the emergency room where his troponin was negative his EKG demonstrated no acute ST-T wave changes. He's had no associated nausea vomiting or diaphoresis. He's had no shortness of breath, PND or orthopnea. She's had no weight gain or edema.   Past Medical History  Diagnosis Date  . GERD (gastroesophageal reflux disease)   . Hypertension   . Anxiety   . Gout     Multiple episodes  . Dysplastic polyp of colon 2008    [facility]  . Treadmill stress test negative for angina pectoris 2011  . Syncopal episodes   . Hyperlipidemia   . Sleep apnea     on CPAP  . Angina   . Myocardial infarction 05/2011    "we think";  06/2011 abnl mv;  06/2011 Cath -  subtotal occlusion of Ramus, otw nonobs dzs;  06/2011 PCI/DES of Ramus w/ 2.25x7mm Resolute DES   . Kidney stones   . Diastasis of muscle 2011    "abdominal"    Past Surgical History  Procedure Date  . Hemorrhoid surgery 2000  . Inguinal hernia repair 1987    left  . Lithotripsy 10/2008  . Ureteroscopy 10/2009  . Cardiac catheterization 06/2011    "diagnostic"  . Coronary angioplasty with stent placement 07/09/11    "1"    No Known Allergies No current facility-administered medications on file prior to encounter.   Current Outpatient Prescriptions on File Prior to Encounter  Medication Sig Dispense Refill  . allopurinol (ZYLOPRIM) 100 MG tablet Take 100 mg by mouth 2 (two) times daily.        . Amlodipine-Valsartan-HCTZ 10-160-25 MG TABS Take 1 tablet by mouth daily.  90 tablet  3  . atorvastatin (LIPITOR) 20 MG tablet Take 10 mg by mouth daily.      Marland Kitchen CINNAMON PO Take 1,000 mg by mouth 2 (two) times daily.       . clopidogrel (PLAVIX) 75 MG tablet Take 1 tablet (75 mg total) by mouth daily.  30 tablet  3  . docusate sodium (COLACE) 100 MG capsule Take 100 mg by mouth 2 (two) times daily.       Marland Kitchen escitalopram (LEXAPRO) 10  MG tablet Take 10 mg by mouth daily.      . fexofenadine (ALLEGRA) 180 MG tablet Take 180 mg by mouth daily as needed.        Marland Kitchen FIBER PO Take 2 tablets by mouth daily.      . hydrochlorothiazide (HYDRODIURIL) 25 MG tablet Take 1 tablet (25 mg total) by mouth at bedtime.  90 tablet  3  . nitroGLYCERIN (NITROSTAT) 0.4 MG SL tablet Place 1 tablet (0.4 mg total) under the tongue every 5 (five) minutes as needed for chest pain.  25 tablet  3  . Omega-3 Fatty Acids (FISH OIL) 1000 MG CAPS Take 2,000 mg by mouth daily.      . pantoprazole (PROTONIX) 40 MG tablet Take 40 mg by mouth daily.      . potassium citrate (UROCIT-K) 10 MEQ (1080 MG) SR tablet Take 10 mEq by mouth daily.       . Testosterone (ANDROGEL PUMP) 20.25 MG/ACT (1.62%) GEL Place 1 application onto the skin  daily.      Marland Kitchen VITAMIN D, CHOLECALCIFEROL, PO Take 2,000 mg by mouth daily.       Metoprolol 25 mg 1/2 pill bid.    . vitamin E 400 UNIT capsule Take 400 Units by mouth daily.       History   Social History  . Marital Status: Married    Spouse Name: N/A    Number of Children: 4  . Years of Education: N/A   Occupational History  . Health insurance broker    Social History Main Topics  . Smoking status: Former Smoker -- 0.5 packs/day for 20 years    Types: Cigarettes    Quit date: 02/17/1990  . Smokeless tobacco: Never Used  . Alcohol Use: 2.4 oz/week    2 Glasses of wine, 2 Cans of beer per week  . Drug Use: 7 per week    Special: Marijuana     07/09/11 "lets say a cigarette a day"  . Sexually Active: Yes   Other Topics Concern  . Not on file   Social History Narrative   Married and has 1 daughter.    Family History  Problem Relation Age of Onset  . Heart attack Mother 93  . Hypertension Other   . Coronary artery disease Other    ROS:  As stated in the HPI and negative for all other systems.   Physical Exam: Blood pressure 116/69, pulse 65, temperature 98.7 F (37.1 C), temperature source Oral, resp. rate 20, SpO2 95.00%.  GENERAL:  Well appearing HEENT:  Pupils equal round and reactive, fundi not visualized, oral mucosa unremarkable NECK:  No jugular venous distention, waveform within normal limits, carotid upstroke brisk and symmetric, no bruits, no thyromegaly LYMPHATICS:  No cervical, inguinal adenopathy LUNGS:  Clear to auscultation bilaterally BACK:  No CVA tenderness CHEST:  Unremarkable HEART:  PMI not displaced or sustained,S1 and S2 within normal limits, no S3, no S4, no clicks, no rubs, no murmurs ABD:  Flat, positive bowel sounds normal in frequency in pitch, no bruits, no rebound, no guarding, no midline pulsatile mass, no hepatomegaly, no splenomegaly EXT:  2 plus pulses throughout, no edema, no cyanosis no clubbing SKIN:  No rashes no  nodules NEURO:  Cranial nerves II through XII grossly intact, motor grossly intact throughout PSYCH:  Cognitively intact, oriented to person place and time  Labs: Lab Results  Component Value Date   BUN 19 10/19/2011   Lab Results  Component Value  Date   CREATININE 0.96 10/19/2011   Lab Results  Component Value Date   NA 140 10/19/2011   K 3.7 10/19/2011   CL 103 10/19/2011   CO2 29 10/19/2011    Lab Results  Component Value Date   WBC 5.5 10/19/2011   HGB 15.5 10/19/2011   HCT 44.2 10/19/2011   MCV 90.2 10/19/2011   PLT 133* 10/19/2011    EKG:  NSR, rate 73, axis WNL, intervals WNL, PVCs. No acute ST T wave changes.  10/19/2011   ASSESSMENT AND PLAN:    Chest Pain - The pain has characteristic similar to his previous angina. He is in unstable pattern. He will be admitted with nitroglycerin paste, IV heparin and continuation of his usual drugs. He will need cardiac catheterization   Gout - Continue previous medications.  Hyperlipidemia -  HDL 30  LDL 48 in July.  Continue previous medications  Sleep apnea -  He will use his own monitor.     SignedRollene Rotunda 10/19/2011, 6:09 PM

## 2011-10-19 NOTE — ED Notes (Signed)
Pt reports 3 day hx of chest pain that started on right side and radiated to left side today and radiates to left neck and jaw. Pt reports it feels like a cramping sensation to chest. Denies any sob, nausea, or diaphoresis.

## 2011-10-19 NOTE — ED Notes (Signed)
Ordered pt a diet tray. Also given water to drink per EDP.

## 2011-10-20 LAB — BASIC METABOLIC PANEL
Calcium: 9.2 mg/dL (ref 8.4–10.5)
Chloride: 101 mEq/L (ref 96–112)
Creatinine, Ser: 1.16 mg/dL (ref 0.50–1.35)
GFR calc Af Amer: 80 mL/min — ABNORMAL LOW (ref >90)
Sodium: 138 mEq/L (ref 135–145)

## 2011-10-20 LAB — LIPID PANEL
LDL Cholesterol: 50 mg/dL (ref 0–99)
Triglycerides: 110 mg/dL (ref <150)

## 2011-10-20 LAB — CBC
HCT: 43.9 % (ref 39.0–52.0)
MCHC: 34.6 g/dL (ref 30.0–36.0)
RDW: 12.3 % (ref 11.5–15.5)
WBC: 6.7 10*3/uL (ref 4.0–10.5)

## 2011-10-20 LAB — HEPARIN LEVEL (UNFRACTIONATED): Heparin Unfractionated: 0.15 IU/mL — ABNORMAL LOW (ref 0.30–0.70)

## 2011-10-20 LAB — TSH: TSH: 3.704 u[IU]/mL (ref 0.350–4.500)

## 2011-10-20 LAB — TROPONIN I: Troponin I: <0.3 ng/mL (ref <0.30)

## 2011-10-20 MED ORDER — ASPIRIN 81 MG PO CHEW: 81.0000 mg | CHEWABLE_TABLET | ORAL | Status: DC

## 2011-10-20 MED ORDER — ATORVASTATIN CALCIUM 20 MG PO TABS: 20.0000 mg | ORAL_TABLET | Freq: Every day | ORAL | Status: DC

## 2011-10-20 MED ORDER — HEPARIN (PORCINE) IN NACL 100-0.45 UNIT/ML-% IJ SOLN
1750.0000 [IU]/h | INTRAMUSCULAR | Status: DC
  Administered 2011-10-20 – 2011-10-21 (×2): 1750 [IU]/h via INTRAVENOUS
  Filled 2011-10-20 (×4): qty 250

## 2011-10-20 MED ORDER — HYDROCODONE-ACETAMINOPHEN 5-325 MG PO TABS: 2.0000 | ORAL_TABLET | ORAL | Status: DC | PRN

## 2011-10-20 MED ORDER — SODIUM CHLORIDE 0.9 % IJ SOLN: 3.0000 mL | INTRAMUSCULAR | Status: DC | PRN

## 2011-10-20 MED ORDER — ATORVASTATIN CALCIUM 20 MG PO TABS
20.0000 mg | ORAL_TABLET | Freq: Every day | ORAL | Status: DC
  Administered 2011-10-20 – 2011-10-21 (×2): 20 mg via ORAL
  Filled 2011-10-20 (×6): qty 1

## 2011-10-20 MED ORDER — ASPIRIN 81 MG PO CHEW
324.0000 mg | CHEWABLE_TABLET | Freq: Once | ORAL | Status: AC
  Administered 2011-10-21: 324 mg via ORAL
  Filled 2011-10-20: qty 4

## 2011-10-20 MED ORDER — SODIUM CHLORIDE 0.9 % IV SOLN: 250.0000 mL | INTRAVENOUS | Status: DC | PRN

## 2011-10-20 MED ORDER — SODIUM CHLORIDE 0.9 % IV SOLN: 1.0000 mL/kg/h | INTRAVENOUS | Status: DC

## 2011-10-20 MED ORDER — SODIUM CHLORIDE 0.9 % IJ SOLN: 3.0000 mL | Freq: Two times a day (BID) | INTRAMUSCULAR | Status: DC

## 2011-10-20 MED ORDER — POTASSIUM CITRATE ER 10 MEQ (1080 MG) PO TBCR
10.0000 meq | EXTENDED_RELEASE_TABLET | Freq: Two times a day (BID) | ORAL | Status: DC
  Administered 2011-10-20 – 2011-10-22 (×5): 10 meq via ORAL
  Filled 2011-10-20 (×10): qty 1

## 2011-10-20 MED ORDER — POTASSIUM CITRATE ER 10 MEQ (1080 MG) PO TBCR
10.0000 meq | EXTENDED_RELEASE_TABLET | Freq: Two times a day (BID) | ORAL | Status: DC
  Filled 2011-10-20 (×2): qty 1

## 2011-10-20 MED ORDER — HEPARIN BOLUS VIA INFUSION
2000.0000 [IU] | Freq: Once | INTRAVENOUS | Status: AC
  Administered 2011-10-20: 2000 [IU] via INTRAVENOUS
  Filled 2011-10-20: qty 2000

## 2011-10-20 NOTE — Progress Notes (Signed)
ANTICOAGULATION CONSULT NOTE - Follow Up Consult  Pharmacy Consult for heparin  Indication: chest pain/ACS  Patient Measurements: Height: 6' (182.9 cm) Weight: 221 lb 9 oz (100.5 kg) IBW/kg (Calculated) : 77.6  Heparin Dosing Weight: 90kg  Vital Signs: Temp: 98.6 F (37 C) (09/02 1400) Temp src: Oral (09/02 1400) BP: 126/78 mmHg (09/02 1400) Pulse Rate: 69  (09/02 1400)  Labs:  Basename 10/20/11 1507 10/20/11 1130 10/20/11 0535 10/19/11 2248 10/19/11 1526  HGB -- -- 15.2 15.1 --  HCT -- -- 43.9 43.1 44.2  PLT -- -- 124* 129* 133*  APTT -- -- -- 61* --  LABPROT -- -- -- 14.5 --  INR -- -- -- 1.11 --  HEPARINUNFRC 0.28* -- 0.15* -- --  CREATININE -- -- 1.16 1.11 0.96  CKTOTAL -- -- -- -- --  CKMB -- -- -- -- --  TROPONINI -- <0.30 <0.30 <0.30 --    Estimated Creatinine Clearance: 88.3 ml/min (by C-G formula based on Cr of 1.16).   Assessment: Heparin level is 0.28 which is just below goal level. No bleeding or infusion related issues noted per nursing. Will make small rate adjustment and follow up plan in am.  Goal of Therapy:  Heparin level 0.3-0.7 units/ml Monitor platelets by anticoagulation protocol: Yes   Plan:  Increase IV heparin to 1750 units/hr Recheck HL with am labs  Keith Gross 10/20/2011,3:43 PM

## 2011-10-20 NOTE — Progress Notes (Signed)
ANTICOAGULATION CONSULT NOTE - Follow Up Consult  Pharmacy Consult for heparin  Indication: chest pain/ACS  No Known Allergies  Patient Measurements: Height: 6' (182.9 cm) Weight: 221 lb 9 oz (100.5 kg) IBW/kg (Calculated) : 77.6  Heparin Dosing Weight:   Vital Signs: Temp: 97.5 F (36.4 C) (09/02 0500) BP: 110/66 mmHg (09/02 0500) Pulse Rate: 61  (09/02 0500)  Labs:  Basename 10/20/11 0535 10/19/11 2248 10/19/11 1526  HGB 15.2 15.1 --  HCT 43.9 43.1 44.2  PLT 124* 129* 133*  APTT -- 61* --  LABPROT -- 14.5 --  INR -- 1.11 --  HEPARINUNFRC 0.15* -- --  CREATININE -- 1.11 0.96  CKTOTAL -- -- --  CKMB -- -- --  TROPONINI -- <0.30 --    Estimated Creatinine Clearance: 92.3 ml/min (by C-G formula based on Cr of 1.11).   Assessment: Heparin level is 0.15. No bleeding or infusion related issues.  Goal of Therapy:  Heparin level 0.3-0.7 units/ml Monitor platelets by anticoagulation protocol: Yes   Plan:   Bolus 2000 units and increase to 1600 units/hr recheck in 6 hours.   Janice Coffin 10/20/2011,6:14 AM

## 2011-10-20 NOTE — Progress Notes (Signed)
Subjective:  Stable.  No chest pain.    Objective:  Vital Signs in the last 24 hours: Temp:  [97.5 F (36.4 C)-98.7 F (37.1 C)] 97.5 F (36.4 C) (09/02 0500) Pulse Rate:  [56-76] 61  (09/02 0500) Resp:  [11-21] 18  (09/02 0500) BP: (105-153)/(64-87) 110/66 mmHg (09/02 0500) SpO2:  [94 %-97 %] 94 % (09/02 0500) Weight:  [221 lb 9 oz (100.5 kg)-231 lb 7.7 oz (105 kg)] 221 lb 9 oz (100.5 kg) (09/01 2236)  Intake/Output from previous day:     Physical Exam: General: Well developed, well nourished, in no acute distress. Head:  Normocephalic and atraumatic. Lungs: Clear to auscultation and percussion. Heart: Normal S1 and S2.  No murmur, rubs or gallops.  Pulses: Pulses normal in all 4 extremities. Extremities: No clubbing or cyanosis. No edema. Neurologic: Alert and oriented x 3.    Lab Results:  Basename 10/20/11 0535 10/19/11 2248  WBC 6.7 6.6  HGB 15.2 15.1  PLT 124* 129*    Basename 10/20/11 0535 10/19/11 2248  NA 138 141  K 3.8 3.2*  CL 101 103  CO2 28 29  GLUCOSE 111* 126*  BUN 17 18  CREATININE 1.16 1.11    Basename 10/20/11 0535 10/19/11 2248  TROPONINI <0.30 <0.30   Hepatic Function Panel  Basename 10/19/11 2248  PROT 6.5  ALBUMIN 4.0  AST 27  ALT 26  ALKPHOS 36*  BILITOT 0.5  BILIDIR --  IBILI --    Basename 10/20/11 0535  CHOL 101   No results found for this basename: PROTIME in the last 72 hours  Imaging: No results found.  EKG:  Normal  Cardiac Studies:  Enzymes negative  Assessment/Plan:  1.  Unstable angina pectoris   -   Plan repeat cath tomorrow for reevaluation.  Patient is agreeable.  Due to headache, will stop nitrates.       [name], MD, [facility], FSCAI 10/20/2011, 10:05 AM

## 2011-10-21 ENCOUNTER — Encounter (HOSPITAL_COMMUNITY)
Admission: EM | Disposition: A | Discharge status: Home / Self Care | Payer: Self-pay | Source: Home / Self Care | Attending: Cardiology

## 2011-10-21 DIAGNOSIS — I2510 Atherosclerotic heart disease of native coronary artery without angina pectoris: Secondary | ICD-10-CM

## 2011-10-21 HISTORY — PX: LEFT HEART CATHETERIZATION WITH CORONARY ANGIOGRAM: SHX5451

## 2011-10-21 LAB — CBC
HCT: 45.6 % (ref 39.0–52.0)
Hemoglobin: 15.8 g/dL (ref 13.0–17.0)
MCV: 91 fL (ref 78.0–100.0)
RDW: 12.5 % (ref 11.5–15.5)
WBC: 6.3 10*3/uL (ref 4.0–10.5)

## 2011-10-21 LAB — HEMOGLOBIN A1C
Hgb A1c MFr Bld: 5.6 % (ref <5.7)
Mean Plasma Glucose: 114 mg/dL (ref <117)

## 2011-10-21 SURGERY — LEFT HEART CATHETERIZATION WITH CORONARY ANGIOGRAM
Anesthesia: LOCAL

## 2011-10-21 MED ORDER — DIAZEPAM 5 MG PO TABS: 5.0000 mg | ORAL_TABLET | ORAL | Status: DC

## 2011-10-21 MED ORDER — MIDAZOLAM HCL 2 MG/2ML IJ SOLN
INTRAMUSCULAR | Status: AC
  Filled 2011-10-21: qty 2

## 2011-10-21 MED ORDER — ASPIRIN EC 81 MG PO TBEC
81.0000 mg | DELAYED_RELEASE_TABLET | Freq: Every day | ORAL | Status: DC
  Administered 2011-10-22: 81 mg via ORAL
  Filled 2011-10-21: qty 1

## 2011-10-21 MED ORDER — DIAZEPAM 5 MG PO TABS
5.0000 mg | ORAL_TABLET | ORAL | Status: AC
  Administered 2011-10-21: 5 mg via ORAL
  Filled 2011-10-21: qty 1

## 2011-10-21 MED ORDER — NITROGLYCERIN 0.2 MG/ML ON CALL CATH LAB
INTRAVENOUS | Status: AC
  Filled 2011-10-21: qty 1

## 2011-10-21 MED ORDER — HEPARIN (PORCINE) IN NACL 2-0.9 UNIT/ML-% IJ SOLN
INTRAMUSCULAR | Status: AC
  Filled 2011-10-21: qty 2000

## 2011-10-21 MED ORDER — HEPARIN SODIUM (PORCINE) 1000 UNIT/ML IJ SOLN
INTRAMUSCULAR | Status: AC
  Filled 2011-10-21: qty 1

## 2011-10-21 MED ORDER — FENTANYL CITRATE 0.05 MG/ML IJ SOLN
INTRAMUSCULAR | Status: AC
  Filled 2011-10-21: qty 2

## 2011-10-21 MED ORDER — SODIUM CHLORIDE 0.9 % IV SOLN
INTRAVENOUS | Status: AC
  Administered 2011-10-21: 16:00:00 via INTRAVENOUS

## 2011-10-21 MED ORDER — LIDOCAINE HCL (PF) 1 % IJ SOLN
INTRAMUSCULAR | Status: AC
  Filled 2011-10-21: qty 30

## 2011-10-21 MED ORDER — VERAPAMIL HCL 2.5 MG/ML IV SOLN
INTRAVENOUS | Status: AC
  Filled 2011-10-21: qty 2

## 2011-10-21 NOTE — Progress Notes (Addendum)
Patient Name: Keith Gross Date of Encounter: 10/21/2011  Active Problems:  Chest pressure  Unstable angina  CAD (coronary artery disease)    SUBJECTIVE: No acute symptoms overnight. Some anxiety, wants Rx. Patient scheduled for left heart Cath with coronary angiogram at 2pm today with Dr. Riley Kill.   OBJECTIVE Filed Vitals:   10/20/11 1015 10/20/11 1400 10/20/11 2100 10/21/11 0845  BP: 123/70 126/78 142/85 155/89  Pulse: 64 69 65 66  Temp:  98.6 F (37 C) 98.3 F (36.8 C) 97.9 F (36.6 C)  TempSrc:  Oral Oral Oral  Resp:  18    Height:      Weight:      SpO2:  95% 95% 95%    Intake/Output Summary (Last 24 hours) at 10/21/11 1012 Last data filed at 10/20/11 1700  Gross per 24 hour  Intake    180 ml  Output      0 ml  Net    180 ml   Filed Weights   10/19/11 2100 10/19/11 2236  Weight: 231 lb 7.7 oz (105 kg) 221 lb 9 oz (100.5 kg)     PHYSICAL EXAM General: Well developed, well nourished, pleseant male in no acute distress. Head: Normocephalic, atraumatic.  Neck: Supple without bruits, JVD non-elevated. Lungs:  Resp regular and unlabored, CTA bilaterally. Heart: RRR, S1, S2, no S3, S4, or murmur. Abdomen: Soft, non-tender, non-distended, BS + x 4.  Extremities: No clubbing, cyanosis, edema. Pulses 2+ in all 4 extrem. Neuro: Alert and oriented X 3. Moves all extremities spontaneously. Psych: Normal affect.  LABS: CBC: Basename 10/21/11 0500 10/20/11 0535 10/19/11 2248 10/19/11 1526  WBC 6.3 6.7 -- --  NEUTROABS -- -- 3.6 3.1  HGB 15.8 15.2 -- --  HCT 45.6 43.9 -- --  MCV 91.0 90.0 -- --  PLT 136* 124* -- --   INR: Basename 10/19/11 2248  INR 1.11   Basic Metabolic Panel: Basename 10/20/11 0535 10/19/11 2248  NA 138 141  K 3.8 3.2*  CL 101 103  CO2 28 29  GLUCOSE 111* 126*  BUN 17 18  CREATININE 1.16 1.11  CALCIUM 9.2 9.4  MG -- 2.0  PHOS -- --   Liver Function Tests: Basename 10/19/11 2248  AST 27  ALT 26  ALKPHOS 36*  BILITOT 0.5    PROT 6.5  ALBUMIN 4.0   Cardiac Enzymes: Basename 10/20/11 1130 10/20/11 0535 10/19/11 2248  CKTOTAL -- -- --  CKMB -- -- --  CKMBINDEX -- -- --  TROPONINI <0.30 <0.30 <0.30    Basename 10/19/11 1533  TROPIPOC 0.00   BNP: Pro B Natriuretic peptide (BNP)  Date/Time Value Range Status  10/19/2011 10:48 PM 37.5  0 - 125 pg/mL Final   Fasting Lipid Panel: Basename 10/20/11 0535  CHOL 101  HDL 29*  LDLCALC 50  TRIG 409  CHOLHDL 3.5  LDLDIRECT --   Thyroid Function Tests: Basename 10/19/11 2248  TSH 3.704  T4TOTAL --  T3FREE --  THYROIDAB --    TELE:      NSR, some sinus brady, no HR sustained < 50  ECG: NSR  Radiology/Studies: No results found.  Current Medications:     . allopurinol  100 mg Oral BID  . amLODipine  10 mg Oral Daily  . aspirin  324 mg Oral Once  . atorvastatin  20 mg Oral QHS  . clopidogrel  75 mg Oral Daily  . docusate sodium  100 mg Oral BID  . escitalopram  10  mg Oral Daily  . hydrochlorothiazide  25 mg Oral Daily  . irbesartan  150 mg Oral Daily  . loratadine  10 mg Oral Daily  . metoprolol tartrate  12.5 mg Oral BID  . pantoprazole  40 mg Oral Daily  . potassium citrate  10 mEq Oral BID  . sodium chloride  3 mL Intravenous Q12H  . sodium chloride  3 mL Intravenous Q12H  . vitamin E  400 Units Oral Daily      . sodium chloride 1 mL/kg/hr (10/21/11 0400)  . heparin 1,750 Units/hr (10/21/11 0310)  . DISCONTD: heparin 1,600 Units/hr (10/20/11 1427)    ASSESSMENT AND PLAN: Active Problems:  1. Chest pressure/Unstable angina/CAD (coronary artery disease) - s/p 2.25x65mm Resolute drug-eluting stent to the RI May 2013. He had recurrent chest pain, ez negative for MI, for cath today. No Sx on current Rx.    2. Hyperglycemia - check A1c  Signed, Theodore Demark , PA-C 10:12 AM 10/21/2011

## 2011-10-21 NOTE — Progress Notes (Signed)
TR BAND REMOVAL  LOCATION:  right radial  DEFLATED PER PROTOCOL:  yes  TIME BAND OFF / DRESSING APPLIED:   1807   SITE UPON ARRIVAL:   Level 0  SITE AFTER BAND REMOVAL:  Level 0  REVERSE ALLEN'S TEST:    positive  CIRCULATION SENSATION AND MOVEMENT:  Within Normal Limits  yes  COMMENTS:

## 2011-10-21 NOTE — CV Procedure (Signed)
   Cardiac Catheterization Procedure Note  Name: Keith Gross MRN: [id number] DOB: Aug 25, 1955  Procedure: Left Heart Cath, Selective Coronary Angiography, LV angiography  Indication: recurrent chest pain with recent PCI.     Procedural Details: The right wrist was prepped, draped, and anesthetized with 1% lidocaine. Using the modified Seldinger technique, a 5 French sheath was introduced into the right radial artery. 3 mg of verapamil was administered through the sheath, weight-based unfractionated heparin was administered intravenously. Standard Judkins catheters were used for selective coronary angiography and left ventriculography. Catheter exchanges were performed over an exchange length guidewire. There were no immediate procedural complications. A TR band was used for radial hemostasis at the completion of the procedure.  The patient was transferred to the post catheterization recovery area for further monitoring.  Procedural Findings: Hemodynamics: AO 115/71 (90) LV 123/13 No gradient on pullback across the aortic valve.   Coronary angiography: Coronary dominance: right  Left mainstem: Fairly short, large caliber and free of disease  Left anterior descending (LAD): Segmental 20-30% narrowing in the proximal vessel, and 20-30% narrowing just after the diagonal.  Distal vessel is large in caliber.  It wraps the apex.  There is mild luminal irregularity  Left circumflex (LCx): Provides a large ramus that has been previously occluded, then stented.  The stent remains widely patent, and the modest area of narrowing distal to the stent looks improved with less than 20-30% plaque.   The AV circumflex just after this demonstrates about 30% narrowing then ectasia and vascular expansion.  The first obtuse marginal has about 50% segmental disease, and appears similar to the prior study.  The AV circ has a 60-70% area, but supplies a small PLA artery, and appears unchanged from the prior study.      Right coronary artery (RCA): Provides a single PDA and is large in caliber.  There is minor irregularity and no obstruction.   Left ventriculography: Left ventricular systolic function is normal, LVEF is estimated at 55-65%, there is no significant mitral regurgitation   Final Conclusions:   1.  Continued patency of the previously stented artery with less than 10% residual narrowing.  2.  Largely unchanged AV circ that demonstrates moderate bifurcation disease.   3.  Preserved LV function.    Recommendations:  1.  It is not clear as to what is the source of pain.  The distal circ appears largely unchanged.  It is bifurcational in nature and vessel bed fairly small.   Would favor continued medical therapy and stress echo to assess ischemia.      [name] 10/21/2011, 3:42 PM

## 2011-10-21 NOTE — Progress Notes (Signed)
ANTICOAGULATION CONSULT NOTE - Follow Up Consult  Pharmacy Consult for heparin  Indication: chest pain/ACS  Patient Measurements: Height: 6' (182.9 cm) Weight: 221 lb 9 oz (100.5 kg) IBW/kg (Calculated) : 77.6  Heparin Dosing Weight: 90kg  Vital Signs: Temp: 97.9 F (36.6 C) (09/03 0845) Temp src: Oral (09/03 0845) BP: 144/83 mmHg (09/03 1015) Pulse Rate: 67  (09/03 1015)  Labs:  Basename 10/21/11 0500 10/20/11 1507 10/20/11 1130 10/20/11 0535 10/19/11 2248 10/19/11 1526  HGB 15.8 -- -- 15.2 -- --  HCT 45.6 -- -- 43.9 43.1 --  PLT 136* -- -- 124* 129* --  APTT -- -- -- -- 61* --  LABPROT -- -- -- -- 14.5 --  INR -- -- -- -- 1.11 --  HEPARINUNFRC 0.69 0.28* -- 0.15* -- --  CREATININE -- -- -- 1.16 1.11 0.96  CKTOTAL -- -- -- -- -- --  CKMB -- -- -- -- -- --  TROPONINI -- -- <0.30 <0.30 <0.30 --    Estimated Creatinine Clearance: 88.3 ml/min (by C-G formula based on Cr of 1.16).   Assessment: ACS with plan for cath lab today.  Heparin drip 1750 uts/hr with Heparin level is 0.69 which is at top of goal. No bleeding or infusion related issues noted per nursing. D/t plan for cath soon will not make any changes and f/u after cath Goal of Therapy:  Heparin level 0.3-0.7 units/ml Monitor platelets by anticoagulation protocol: Yes   Plan:  Continue IV heparin to 1750 units/hr   Marcelino Scot 10/21/2011,11:36 AM

## 2011-10-21 NOTE — Progress Notes (Signed)
See note of Ms. Barrett.  Patient has recurrent chest pain following recent PCI.  Did have some disease distal to the stent.  Will plan relook to assess vessel patency.  Patient is agreeable to proceed.  Patient is agreeable.

## 2011-10-22 DIAGNOSIS — I2510 Atherosclerotic heart disease of native coronary artery without angina pectoris: Secondary | ICD-10-CM

## 2011-10-22 LAB — CBC
HCT: 44.5 % (ref 39.0–52.0)
Hemoglobin: 15.5 g/dL (ref 13.0–17.0)
MCV: 90.8 fL (ref 78.0–100.0)
Platelets: 132 10*3/uL — ABNORMAL LOW (ref 150–400)
RBC: 4.9 MIL/uL (ref 4.22–5.81)
WBC: 6.6 10*3/uL (ref 4.0–10.5)

## 2011-10-22 NOTE — Discharge Summary (Signed)
CARDIOLOGY DISCHARGE SUMMARY   Patient ID: Keith Gross MRN: [id number] DOB/AGE: [birth year] 56 y.o.  Admit date: 10/19/2011 Discharge date: 10/22/2011  Primary Discharge Diagnosis:  Chest pain, unstable angina, medical therapy recommended Secondary Discharge Diagnosis:  Past Medical History  Diagnosis Date  . GERD (gastroesophageal reflux disease)   . Hypertension   . Anxiety   . Gout     Multiple episodes  . Dysplastic polyp of colon 2008    [facility]  . Treadmill stress test negative for angina pectoris 2011  . Syncopal episodes   . Hyperlipidemia   . Sleep apnea     on CPAP  . Angina   . Myocardial infarction 05/2011    "we think";  06/2011 abnl mv;  06/2011 Cath - subtotal occlusion of Ramus, otw nonobs dzs;  06/2011 PCI/DES of Ramus w/ 2.25x62mm Resolute DES   . Kidney stones   . Diastasis of muscle 2011    "abdominal"    Procedures: Left Heart Cath, Selective Coronary Angiography, LV angiography  Hospital Course: Keith Gross is a 56 year old male with a history of coronary artery disease. Multiple episodes of chest pain and came to the emergency room where he was admitted for further evaluation and treatment.  His cardiac enzymes were negative for MI. His ECG had no acute changes. At the time of his cath in May of 2013, his circumflex had approximately 70% lesion. Repeat cardiac catheterization was recommended and he was taken to the lab on 10/21/2011.  Full cardiac catheterization results are listed below. Medical therapy was recommended. He is to have an outpatient exercise treadmill test to further evaluate him for ischemia. He was held overnight for hydration.  On 10/22/2011, he was evaluated by Dr. Riley Kill. The cath results were reviewed with the patient and his wife. Keith Gross was ambulating without chest pain or shortness of breath and considered stable for discharge, to follow up as an outpatient.  Labs:   Lab Results  Component Value Date   WBC 6.6 10/22/2011   HGB 15.5  10/22/2011   HCT 44.5 10/22/2011   MCV 90.8 10/22/2011   PLT 132* 10/22/2011    Lab 10/20/11 0535 10/19/11 2248  NA 138 --  K 3.8 --  CL 101 --  CO2 28 --  BUN 17 --  CREATININE 1.16 --  CALCIUM 9.2 --  PROT -- 6.5  BILITOT -- 0.5  ALKPHOS -- 36*  ALT -- 26  AST -- 27  GLUCOSE 111* --    Basename 10/20/11 1130 10/20/11 0535 10/19/11 2248  CKTOTAL -- -- --  CKMB -- -- --  CKMBINDEX -- -- --  TROPONINI <0.30 <0.30 <0.30   Lipid Panel     Component Value Date/Time   CHOL 101 10/20/2011 0535   TRIG 110 10/20/2011 0535   HDL 29* 10/20/2011 0535   CHOLHDL 3.5 10/20/2011 0535   VLDL 22 10/20/2011 0535   LDLCALC 50 10/20/2011 0535    Pro B Natriuretic peptide (BNP)  Date/Time Value Range Status  10/19/2011 10:48 PM 37.5  0 - 125 pg/mL Final    Basename 10/19/11 2248  INR 1.11   Cardiac Cath: 10/21/2011 Left mainstem: Fairly short, large caliber and free of disease  Left anterior descending (LAD): Segmental 20-30% narrowing in the proximal vessel, and 20-30% narrowing just after the diagonal. Distal vessel is large in caliber. It wraps the apex. There is mild luminal irregularity  Left circumflex (LCx): Provides a large ramus that has been previously occluded, then  stented. The stent remains widely patent, and the modest area of narrowing distal to the stent looks improved with less than 20-30% plaque. The AV circumflex just after this demonstrates about 30% narrowing then ectasia and vascular expansion. The first obtuse marginal has about 50% segmental disease, and appears similar to the prior study. The AV circ has a 60-70% area, but supplies a small PLA artery, and appears unchanged from the prior study.  Right coronary artery (RCA): Provides a single PDA and is large in caliber. There is minor irregularity and no obstruction.  Left ventriculography: Left ventricular systolic function is normal, LVEF is estimated at 55-65%, there is no significant mitral regurgitation  Final Conclusions:  1.  Continued patency of the previously stented artery with less than 10% residual narrowing.  2. Largely unchanged AV circ that demonstrates moderate bifurcation disease.  3. Preserved LV function.  Recommendations:  1. It is not clear as to what is the source of pain. The distal circ appears largely unchanged. It is bifurcational in nature and vessel bed fairly small. Would favor continued medical therapy and stress echo to assess ischemia.   EKG: 21-Oct-2011 05:10:17  Normal sinus rhythm Normal ECG Vent. rate 65 BPM PR interval 148 ms QRS duration 108 ms QT/QTc 420/436 ms P-R-T axes 53 5 35  FOLLOW UP PLANS AND APPOINTMENTS No Known Allergies Medication List  As of 10/22/2011 10:37 AM   STOP taking these medications         aspirin 325 MG tablet         TAKE these medications         allopurinol 100 MG tablet   Commonly known as: ZYLOPRIM   Take 100 mg by mouth 2 (two) times daily.      Amlodipine-Valsartan-HCTZ 10-160-25 MG Tabs   Take 1 tablet by mouth daily.      ANDROGEL PUMP 20.25 MG/ACT (1.62%) Gel   Generic drug: Testosterone   Place 1 application onto the skin daily.      aspirin EC 81 MG tablet   Take 81 mg by mouth 2 (two) times daily.      atorvastatin 10 MG tablet   Commonly known as: LIPITOR   Take 10 mg by mouth daily.      CINNAMON PO   Take 1,000 mg by mouth 2 (two) times daily.      clopidogrel 75 MG tablet   Commonly known as: PLAVIX   Take 1 tablet (75 mg total) by mouth daily.      docusate sodium 100 MG capsule   Commonly known as: COLACE   Take 100 mg by mouth 2 (two) times daily.      escitalopram 10 MG tablet   Commonly known as: LEXAPRO   Take 10 mg by mouth daily.      fexofenadine 180 MG tablet   Commonly known as: ALLEGRA   Take 180 mg by mouth daily as needed.      FIBER PO   Take 2 tablets by mouth daily.      Fish Oil 1000 MG Caps   Take 2,000 mg by mouth daily.      hydrochlorothiazide 25 MG tablet   Commonly known as:  HYDRODIURIL   Take 1 tablet (25 mg total) by mouth at bedtime.      metoprolol tartrate 25 MG tablet   Commonly known as: LOPRESSOR   Take 12.5 mg by mouth 2 (two) times daily.      nitroGLYCERIN 0.4 MG  SL tablet   Commonly known as: NITROSTAT   Place 1 tablet (0.4 mg total) under the tongue every 5 (five) minutes as needed for chest pain.      pantoprazole 40 MG tablet   Commonly known as: PROTONIX   Take 40 mg by mouth daily.      potassium citrate 10 MEQ (1080 MG) SR tablet   Commonly known as: UROCIT-K   Take 10 mEq by mouth daily.      VITAMIN D (CHOLECALCIFEROL) PO   Take 2,000 mg by mouth daily.      vitamin E 400 UNIT capsule   Take 400 Units by mouth daily.           Discharge Orders    Future Appointments: Provider: Department: Dept Phone: Center:   10/24/2011 8:45 AM Antonieta Iba, MD [facility] [phone] [facility]     Follow-up Information    Follow up with Julien Nordmann, MD. (Friday, September 6th at 8:45 for treadmill test and follow-up appointment)    Contact information:   [address] [place] [address] [phone]          BRING ALL MEDICATIONS WITH YOU TO FOLLOW UP APPOINTMENTS  Time spent with patient to include physician time: 36 min Signed: Theodore Demark 10/22/2011, 10:37 AM Co-Sign MD

## 2011-10-22 NOTE — Progress Notes (Signed)
Subjective:  Doing well. Ambulated without any pain.  Feels pretty good.  Anatomy of cath reviewed with patient and wife in detail.    Objective:  Vital Signs in the last 24 hours: Temp:  [97.8 F (36.6 C)-98.7 F (37.1 C)] 98.1 F (36.7 C) (09/04 0820) Pulse Rate:  [64-75] 65  (09/04 0820) Resp:  [15-19] 15  (09/04 0820) BP: (118-149)/(69-94) 149/94 mmHg (09/04 0820) SpO2:  [96 %-98 %] 96 % (09/04 0820) Weight:  [223 lb 15.8 oz (101.6 kg)] 223 lb 15.8 oz (101.6 kg) (09/04 0530)  Intake/Output from previous day: 09/03 0701 - 09/04 0700 In: 1465 [P.O.:840; I.V.:625] Out: -    Physical Exam: General: Well developed, well nourished, in no acute distress. Head:  Normocephalic and atraumatic. Lungs: Clear to auscultation and percussion. Heart: Normal S1 and S2.  No murmur, rubs or gallops.  Pulses: Pulses normal in all 4 extremities.  Wrist looks good.   Extremities: No clubbing or cyanosis. No edema. Neurologic: Alert and oriented x 3.    Lab Results:  Basename 10/22/11 0550 10/21/11 0500  WBC 6.6 6.3  HGB 15.5 15.8  PLT 132* 136*    Basename 10/20/11 0535 10/19/11 2248  NA 138 141  K 3.8 3.2*  CL 101 103  CO2 28 29  GLUCOSE 111* 126*  BUN 17 18  CREATININE 1.16 1.11    Basename 10/20/11 1130 10/20/11 0535  TROPONINI <0.30 <0.30   Hepatic Function Panel  Basename 10/19/11 2248  PROT 6.5  ALBUMIN 4.0  AST 27  ALT 26  ALKPHOS 36*  BILITOT 0.5  BILIDIR --  IBILI --    Basename 10/20/11 0535  CHOL 101   No results found for this basename: PROTIME in the last 72 hours  Imaging: No results found.  EKG:  NSR.  WNL.      Assessment/Plan:  Patient Active Hospital Problem List: Chest pressure (06/25/2011)   Assessment: not sure as to etiology.   Plan: I spoke with Dr. Mariah Milling.  Set up early regular GXT for assessment of ischemia.  Doubt nuclear would add and would be large radiation dose.   CAD (coronary artery disease) (07/10/2011)   Assessment: see  cath data.  Patent stent.  CFX largely unchanged.    Plan: continue medical therapy. Pt should go home on his home meds.  He had several changed in hospital due to pharmacy.  Dr. Mariah Milling is aware of his admission and we will need to make sure date of GXT and follow up.  I told patient to take it easy until then and call back for issues.        [name], MD, [facility], FSCAI 10/22/2011, 9:33 AM

## 2011-10-23 ENCOUNTER — Telehealth: Payer: Self-pay | Admitting: Cardiovascular Disease

## 2011-10-23 ENCOUNTER — Other Ambulatory Visit: Payer: Self-pay

## 2011-10-23 DIAGNOSIS — R079 Chest pain, unspecified: Secondary | ICD-10-CM

## 2011-10-23 DIAGNOSIS — I25810 Atherosclerosis of coronary artery bypass graft(s) without angina pectoris: Secondary | ICD-10-CM

## 2011-10-23 NOTE — Telephone Encounter (Signed)
Spoke with patient's wife about changing the date of Mr. Keith Gross treadmill test.  She had questions about his activity level and restrictions until this procedure.  Also needs to know instructions regarding medications to hold prior to treadmill.

## 2011-10-23 NOTE — Telephone Encounter (Signed)
Pt's wife informed to have pt hold metoprolol am of stress test. Also advised, per Dr. Mariah Milling, pt may return to work, but must refrain from heavy lifting/pushing or pulling.  She verb. Understanding.

## 2011-10-24 ENCOUNTER — Encounter: Payer: BC Managed Care – PPO | Admitting: Cardiovascular Disease

## 2011-10-29 ENCOUNTER — Other Ambulatory Visit: Payer: Self-pay | Admitting: Cardiovascular Disease

## 2011-10-29 ENCOUNTER — Ambulatory Visit (INDEPENDENT_AMBULATORY_CARE_PROVIDER_SITE_OTHER): Payer: BC Managed Care – PPO | Admitting: Cardiovascular Disease

## 2011-10-29 ENCOUNTER — Encounter: Payer: Self-pay | Admitting: Cardiovascular Disease

## 2011-10-29 VITALS — BP 118/82 | HR 81 | Ht 72.0 in | Wt 231.0 lb

## 2011-10-29 DIAGNOSIS — I25810 Atherosclerosis of coronary artery bypass graft(s) without angina pectoris: Secondary | ICD-10-CM

## 2011-10-29 DIAGNOSIS — R079 Chest pain, unspecified: Secondary | ICD-10-CM

## 2011-10-29 NOTE — Patient Instructions (Addendum)
You are doing well. Stress test was normal  No restrictions on activity  Continue aspirin 81 mg daily with plavix daily  Please call us if you have new issues that need to be addressed before your next appt.  Your physician wants you to follow-up in: 6 months.  You will receive a reminder letter in the mail two months in advance. If you don't receive a letter, please call our office to schedule the follow-up appointment.

## 2011-10-29 NOTE — Telephone Encounter (Signed)
Refilled Plavix

## 2011-10-29 NOTE — Procedures (Signed)
Exercise Treadmill Test  Treadmill ordered for recent epsiodes of chest pain.  Resting EKG shows NSR with rate of 74 bpm, no significant ST or T wave changes Resting blood pressure of 118/82 Stand bruce protocal was used.  Patient exercised for 10 minutes Peak heart rate of 164 bpm.  This was 99% of the maximum predicted heart rate (target heart rate 165). Achieved 11.6 METS No symptoms of chest pain or lightheadedness were reported at peak stress or in recovery.  Peak Blood pressure recorded was 202/78 Heart rate at 3 minutes in recovery was 89 bpm. No significant ST changes at peak stress or recovery concerning for ischemia.  FINAL IMPRESSION: Normal exercise stress test. No significant EKG changes concerning for ischemia. Excellent exercise tolerance.

## 2011-11-06 NOTE — Progress Notes (Signed)
Patient is for cardiac cath study today.

## 2011-11-06 NOTE — Discharge Summary (Signed)
Patient is to follow up with Dr. Mariah Milling in [place].  OP stress echo will be recommended based on the findings.

## 2011-11-11 DIAGNOSIS — E291 Testicular hypofunction: Secondary | ICD-10-CM | POA: Insufficient documentation

## 2011-11-11 DIAGNOSIS — R3129 Other microscopic hematuria: Secondary | ICD-10-CM | POA: Insufficient documentation

## 2011-11-11 DIAGNOSIS — N401 Enlarged prostate with lower urinary tract symptoms: Secondary | ICD-10-CM | POA: Insufficient documentation

## 2011-11-11 DIAGNOSIS — N411 Chronic prostatitis: Secondary | ICD-10-CM | POA: Insufficient documentation

## 2011-11-11 DIAGNOSIS — N201 Calculus of ureter: Secondary | ICD-10-CM | POA: Insufficient documentation

## 2011-11-11 DIAGNOSIS — R972 Elevated prostate specific antigen [PSA]: Secondary | ICD-10-CM | POA: Insufficient documentation

## 2011-11-11 DIAGNOSIS — N529 Male erectile dysfunction, unspecified: Secondary | ICD-10-CM | POA: Insufficient documentation

## 2011-11-12 ENCOUNTER — Ambulatory Visit: Payer: Self-pay | Admitting: Urology

## 2011-11-20 ENCOUNTER — Encounter: Payer: Self-pay | Admitting: Cardiovascular Disease

## 2012-02-12 ENCOUNTER — Other Ambulatory Visit: Payer: Self-pay | Admitting: *Deleted

## 2012-02-12 MED ORDER — ATORVASTATIN CALCIUM 20 MG PO TABS: 20.0000 mg | ORAL_TABLET | Freq: Every day | ORAL | Status: DC

## 2012-02-12 NOTE — Telephone Encounter (Signed)
Refilled Atorvastatin.

## 2012-02-16 ENCOUNTER — Other Ambulatory Visit: Payer: Self-pay

## 2012-02-16 ENCOUNTER — Other Ambulatory Visit: Payer: Self-pay | Admitting: Cardiovascular Disease

## 2012-02-16 MED ORDER — CLOPIDOGREL BISULFATE 75 MG PO TABS: 75.0000 mg | ORAL_TABLET | Freq: Every day | ORAL | Status: DC

## 2012-02-16 NOTE — Telephone Encounter (Signed)
Refilled Clopidogrel. 

## 2012-02-16 NOTE — Telephone Encounter (Signed)
Refill sent for plavix  

## 2012-02-26 ENCOUNTER — Other Ambulatory Visit: Payer: Self-pay | Admitting: Cardiovascular Disease

## 2012-02-27 ENCOUNTER — Other Ambulatory Visit: Payer: Self-pay | Admitting: *Deleted

## 2012-02-27 MED ORDER — CLOPIDOGREL BISULFATE 75 MG PO TABS: 75.0000 mg | ORAL_TABLET | Freq: Every day | ORAL | Status: DC

## 2012-03-16 ENCOUNTER — Encounter: Payer: Self-pay | Admitting: *Deleted

## 2012-03-17 ENCOUNTER — Ambulatory Visit: Payer: BC Managed Care – PPO | Admitting: Internal Medicine

## 2012-04-05 ENCOUNTER — Other Ambulatory Visit: Payer: Self-pay | Admitting: *Deleted

## 2012-04-05 ENCOUNTER — Other Ambulatory Visit: Payer: Self-pay

## 2012-04-05 MED ORDER — AMLODIPINE-VALSARTAN-HCTZ 10-160-25 MG PO TABS: 1.0000 | ORAL_TABLET | Freq: Every day | ORAL | Status: DC

## 2012-04-05 NOTE — Telephone Encounter (Signed)
Pt requesting 90 day

## 2012-04-07 ENCOUNTER — Encounter: Payer: Self-pay | Admitting: Internal Medicine

## 2012-04-07 ENCOUNTER — Ambulatory Visit (INDEPENDENT_AMBULATORY_CARE_PROVIDER_SITE_OTHER): Payer: BC Managed Care – PPO | Admitting: Internal Medicine

## 2012-04-07 VITALS — BP 134/90 | HR 75 | Temp 98.3°F | Ht 71.5 in | Wt 223.5 lb

## 2012-04-07 DIAGNOSIS — I2510 Atherosclerotic heart disease of native coronary artery without angina pectoris: Secondary | ICD-10-CM

## 2012-04-07 DIAGNOSIS — F411 Generalized anxiety disorder: Secondary | ICD-10-CM

## 2012-04-07 DIAGNOSIS — F419 Anxiety disorder, unspecified: Secondary | ICD-10-CM

## 2012-04-07 DIAGNOSIS — K219 Gastro-esophageal reflux disease without esophagitis: Secondary | ICD-10-CM

## 2012-04-07 DIAGNOSIS — E785 Hyperlipidemia, unspecified: Secondary | ICD-10-CM

## 2012-04-07 DIAGNOSIS — N200 Calculus of kidney: Secondary | ICD-10-CM

## 2012-04-07 DIAGNOSIS — M25519 Pain in unspecified shoulder: Secondary | ICD-10-CM

## 2012-04-07 DIAGNOSIS — G4730 Sleep apnea, unspecified: Secondary | ICD-10-CM

## 2012-04-07 DIAGNOSIS — I10 Essential (primary) hypertension: Secondary | ICD-10-CM

## 2012-04-11 ENCOUNTER — Encounter: Payer: Self-pay | Admitting: Internal Medicine

## 2012-04-11 NOTE — Assessment & Plan Note (Signed)
Low cholesterol diet and exercise.  Apparently being followed through cardiology.

## 2012-04-11 NOTE — Assessment & Plan Note (Addendum)
Blood pressure elevated as outlined.  He is having to split the amlodipine from his combination BP med.  Blood pressure elevated in the am and in normal range in the evening.  Will have him take his amlodipine in the evening and continue the valsartan/hctz in the am.  Follow pressures closely.  If persistent increased, may need to increase the ARB.  Follow.   Stop the antiinflammatories.  Will refer to ortho for an injection.  Worked well for him previously.  Can try tylenol as directed.

## 2012-04-11 NOTE — Assessment & Plan Note (Signed)
On allopurinol.  Stable.  Follow.

## 2012-04-11 NOTE — Assessment & Plan Note (Signed)
Has known disease s/p stent placement.  Stent placed in 5/13.  We discussed avoiding any non emergent surgeries/procedures for one year total.  Hold on colonoscopy.  Due to see Dr Mariah Milling next week.  Currently doing well and exercising.  Follow.

## 2012-04-11 NOTE — Assessment & Plan Note (Addendum)
Controlled on current regimen.  Follow.   Stop the antiinflammatories.

## 2012-04-11 NOTE — Assessment & Plan Note (Signed)
Followed by Dr Achilles Dunk.  Currently doing well.

## 2012-04-11 NOTE — Assessment & Plan Note (Signed)
Uses his CPAP regularly.  Follow.

## 2012-04-11 NOTE — Assessment & Plan Note (Signed)
With increased stress and anxiety.  See above.  On lexapro.  Discussed at length with the patient and his wife today.  Will hold on making changes in his lexapro at this time.  Stress is decreasing some at work.  Takes xanax prn.  Will follow closely.  Treat the shoulder.  See if we can get him sleeping better.  Follow.

## 2012-04-11 NOTE — Progress Notes (Signed)
Subjective:    Patient ID: Keith Gross, male    DOB: 1955-07-31, 57 y.o.   MRN: [id number]  HPI 57 year old male with past history of CAD with stent placed 5/13 (followed by Dr Mariah Milling), hypercholesterolemia, hypertension, nephrolithiasis, gout and sleep apnea.  He comes in today to follow up on these issues as well as to establish care.  Since he had his stent placed, he has been exercising regularly.  Has a trainer.  Works with the trainer 2 days/week and does cardio 3days/week.  In 9/13 had a "spell".  Diagnosed with angina.  States his repeat cath - ok.  Has been exercising and trying to change his diet.  Previous smoker.  Increased stress.  Wife accompanies him today.  History obtained from both of them.  States he feels more tense.  Agitated and gets angry easier.  Takes xanax bid.  Is on Lexapro.  Has trouble sleeping.   Uses his CPAP regularly.  Blood pressure has been elevated.  He has been taking an increased amount of ibuprofen for his shoulder.  States tramadol does not work.  More pain in the left shoulder.  Has had an issue with this previously.  Has seen Dr Gavin Potters.  Had injection and this helped.  (injection 2-3 years ago).  Blood pressure has been elevated.  Taking his meds regularly.  Is going to have to split his combination bp pill.  Sees Dr Achilles Dunk.  Uses androgel.  He follows his PSA and follows him for kidney stones.  No acute urinary symptoms reported.   Past Medical History  Diagnosis Date  . GERD (gastroesophageal reflux disease)   . Hypertension   . Anxiety   . Gout     Multiple episodes  . Dysplastic polyp of colon 2008    [facility]  . Treadmill stress test negative for angina pectoris 2011  . Syncopal episodes   . Hyperlipidemia   . Sleep apnea     on CPAP  . Angina   . Myocardial infarction 05/2011    "we think";  06/2011 abnl mv;  06/2011 Cath - subtotal occlusion of Ramus, otw nonobs dzs;  06/2011 PCI/DES of Ramus w/ 2.25x95mm Resolute DES   . Kidney stones   .  Diastasis of muscle 2011    "abdominal"    Current Outpatient Prescriptions on File Prior to Visit  Medication Sig Dispense Refill  . allopurinol (ZYLOPRIM) 100 MG tablet Take 100 mg by mouth 2 (two) times daily.        . Amlodipine-Valsartan-HCTZ 10-160-25 MG TABS Take 1 tablet by mouth daily.  90 tablet  3  . aspirin EC 81 MG tablet Take 81 mg by mouth daily.       Marland Kitchen atorvastatin (LIPITOR) 20 MG tablet Take 1 tablet (20 mg total) by mouth daily.  30 tablet  3  . CINNAMON PO Take 1,000 mg by mouth 2 (two) times daily.       . clopidogrel (PLAVIX) 75 MG tablet Take 1 tablet (75 mg total) by mouth daily.  30 tablet  6  . Cyanocobalamin (VITAMIN B 12 PO) Take by mouth daily.      Marland Kitchen docusate sodium (COLACE) 100 MG capsule Take 100 mg by mouth 2 (two) times daily.       . fexofenadine (ALLEGRA) 180 MG tablet Take 180 mg by mouth daily as needed.        Marland Kitchen FIBER PO Take 2 tablets by mouth daily.      Marland Kitchen  hydrochlorothiazide (HYDRODIURIL) 25 MG tablet Take 1 tablet (25 mg total) by mouth at bedtime.  90 tablet  3  . metoprolol tartrate (LOPRESSOR) 25 MG tablet 1/2 tablet by mouth daily      . nitroGLYCERIN (NITROSTAT) 0.4 MG SL tablet Place 1 tablet (0.4 mg total) under the tongue every 5 (five) minutes as needed for chest pain.  25 tablet  3  . Omega-3 Fatty Acids (FISH OIL) 1000 MG CAPS One every other day      . pantoprazole (PROTONIX) 40 MG tablet Take 40 mg by mouth daily.      . potassium citrate (UROCIT-K) 10 MEQ (1080 MG) SR tablet Take 10 mEq by mouth 2 (two) times daily.       . Testosterone (ANDROGEL PUMP) 20.25 MG/ACT (1.62%) GEL Place 1 application onto the skin daily.      Marland Kitchen VITAMIN D, CHOLECALCIFEROL, PO Take 2,000 mg by mouth daily.       No current facility-administered medications on file prior to visit.    Review of Systems Patient denies any headache, lightheadedness or dizziness.  No significant sinus or allergy symptoms currently.  No chest pain, tightness or palpitations.   No increased shortness of breath, cough or congestion.  Breathing is stable.  Using CPAP regularly.  No nausea or vomiting.  Acid reflux controlled.  No abdominal pain or cramping.  No bowel change, such as diarrhea, constipation, BRBPR or melana.  No urine change.   Increased stress as outlined.  Increased blood pressure. Exercising.       Objective:   Physical Exam Filed Vitals:   04/07/12 1151  BP: 134/90  Pulse: 75  Temp: 98.3 F (36.8 C)   Blood pressure recheck:  29/62  57 year old male in no acute distress.  HEENT:  Nares - clear.  Oropharynx - without lesions. NECK:  Supple.  Nontender.  No audible carotid bruit.  HEART:  Appears to be regular.   LUNGS:  No crackles or wheezing audible.  Respirations even and unlabored.   RADIAL PULSE:  Equal bilaterally.  ABDOMEN:  Soft.  Nontender.  Bowel sounds present and normal.  No audible abdominal bruit.  GU:  Performed through GU.   EXTREMITIES:  No increased edema present.  DP pulses palpable and equal bilaterally.     SKIN: no rash.       Assessment & Plan:  SHOULDER PAIN.  See above.  Stop the antiinflammatories.  Refer to Dr Gavin Potters for evaluation and possible injection.  Has seen him previously.  Injection worked well previously.    HEALTH MAINTENANCE.  Gets his prostate checks and PSA through urology.  Colonoscopy - Dr Devona Konig.  Overdue.    I spen 45 minutes with this patient. More than 50% of the time spent in consultation regarding the above.

## 2012-04-12 ENCOUNTER — Encounter: Payer: Self-pay | Admitting: Cardiovascular Disease

## 2012-04-12 ENCOUNTER — Ambulatory Visit (INDEPENDENT_AMBULATORY_CARE_PROVIDER_SITE_OTHER): Payer: BC Managed Care – PPO | Admitting: Cardiovascular Disease

## 2012-04-12 VITALS — BP 138/92 | HR 84 | Ht 71.5 in | Wt 236.8 lb

## 2012-04-12 DIAGNOSIS — I2510 Atherosclerotic heart disease of native coronary artery without angina pectoris: Secondary | ICD-10-CM

## 2012-04-12 DIAGNOSIS — R454 Irritability and anger: Secondary | ICD-10-CM

## 2012-04-12 DIAGNOSIS — E785 Hyperlipidemia, unspecified: Secondary | ICD-10-CM

## 2012-04-12 DIAGNOSIS — I10 Essential (primary) hypertension: Secondary | ICD-10-CM

## 2012-04-12 MED ORDER — AMOXICILLIN-POT CLAVULANATE 875-125 MG PO TABS: 1.0000 | ORAL_TABLET | Freq: Two times a day (BID) | ORAL | Status: DC

## 2012-04-12 MED ORDER — AMLODIPINE-VALSARTAN-HCTZ 10-320-25 MG PO TABS: 1.0000 | ORAL_TABLET | Freq: Every day | ORAL | Status: DC

## 2012-04-12 NOTE — Assessment & Plan Note (Signed)
He would like to retry exforge HCT as this seemed to control his blood pressure better and he liked the convenience. We will send in a new prescription for 10/320/25 mg daily, higher than what he was on previously.  If blood pressure continues to run high, other options may be needed such as extra amlodipine 5 mg daily. He has a coupon card for 4$ prescription.

## 2012-04-12 NOTE — Patient Instructions (Addendum)
Start exforge HCT  10/320/25 mg daily Start augmentin twice a day for 10 days for sinus infection   Please call us if you have new issues that need to be addressed before your next appt.  Your physician wants you to follow-up in: 6 months.  You will receive a reminder letter in the mail two months in advance. If you don't receive a letter, please call our office to schedule the follow-up appointment.

## 2012-04-12 NOTE — Assessment & Plan Note (Signed)
Cholesterol is at goal on the current lipid regimen. No changes to the medications were made.  

## 2012-04-12 NOTE — Assessment & Plan Note (Signed)
Wife reports he has symptoms of irritability. Uncertain if this is related to work stress or home stress. He does report having poor sleep. He is taking Unisom for sleep but wakes up feeling tired. Also take Xanax before bed and sometimes in the morning.

## 2012-04-12 NOTE — Assessment & Plan Note (Signed)
Currently with no symptoms of angina. No further workup at this time. Continue current medication regimen. 

## 2012-04-12 NOTE — Progress Notes (Signed)
Patient ID: Keith Gross, male    DOB: Mar 20, 1955, 57 y.o.   MRN: [id number]  HPI Comments: Mr. Keith Gross is a very pleasant 57 year old gentleman, patient of Dr. Dale Cow Gross and Keith Gross, remote history of syncope in 2010 felt secondary to low blood pressure that improved with medication changes, presenting to Dr. Katrinka Gross  with chest pain symptoms at the beginning of  April 2013, stress test showing inferior lateral hypoperfusion consistent with large region of scar which is new from 2010, cardiac catheterization showing severe ramus disease, with DES stent placed in May 2013.  He presents for routine followup. He reports that he feels well with no complaints. His wife reports that he continues to have periods of anger and irritability.   No more episodes of chest pain. He is exercising with a personal trainer several times per week. Reports blood pressure has been higher recently. Trouble obtaining exforge this is required preauthorization with near insurance. Medication was changed to amlodipine and losartan/HCT. Blood pressure has not been as well controlled on this regiment. He is taking one in the morning, one in the evening in the past week. Diastolic pressure continues to be in the 90s.   He does have a previous history of smoking, quit in 1992. Father had his first heart attack at age 55.  EKG shows normal sinus rhythm with rate 84 beats per minute with old inferior MI  Outpatient Encounter Prescriptions as of 04/12/2012  Medication Sig Dispense Refill  . allopurinol (ZYLOPRIM) 100 MG tablet Take 100 mg by mouth 2 (two) times daily.        Marland Kitchen ALPRAZolam (XANAX) 0.5 MG tablet 1/2 tablet by mouth twice a day      . amLODipine (NORVASC) 10 MG tablet Take 10 mg by mouth at bedtime.      Marland Kitchen aspirin EC 81 MG tablet Take 81 mg by mouth daily.       Marland Kitchen atorvastatin (LIPITOR) 20 MG tablet Take 1 tablet (20 mg total) by mouth daily.  30 tablet  3  . CINNAMON PO Take 1,000 mg by mouth 2 (two) times  daily.       . clopidogrel (PLAVIX) 75 MG tablet Take 1 tablet (75 mg total) by mouth daily.  30 tablet  6  . Cyanocobalamin (VITAMIN B 12 PO) Take by mouth daily.      Marland Kitchen docusate sodium (COLACE) 100 MG capsule Take 100 mg by mouth 2 (two) times daily.       Marland Kitchen escitalopram (LEXAPRO) 20 MG tablet Take 20 mg by mouth daily.      . fexofenadine (ALLEGRA) 180 MG tablet Take 180 mg by mouth daily.       Marland Kitchen FIBER PO Take 2 tablets by mouth daily.      . hydrochlorothiazide (HYDRODIURIL) 25 MG tablet Take 1 tablet (25 mg total) by mouth at bedtime.  90 tablet  3  . Hydrocodone-Chlorpheniramine 5-4 MG/5ML SOLN Take by mouth.      . magnesium oxide (MAG-OX) 400 MG tablet Take 400 mg by mouth daily.      . metoprolol tartrate (LOPRESSOR) 25 MG tablet 1/2 tablet by mouth daily      . nitroGLYCERIN (NITROSTAT) 0.4 MG SL tablet Place 1 tablet (0.4 mg total) under the tongue every 5 (five) minutes as needed for chest pain.  25 tablet  3  . Omega-3 Fatty Acids (FISH OIL) 1000 MG CAPS One every other day      . pantoprazole (  PROTONIX) 40 MG tablet Take 40 mg by mouth daily.      . potassium citrate (UROCIT-K) 10 MEQ (1080 MG) SR tablet Take 10 mEq by mouth 2 (two) times daily.       . Testosterone (ANDROGEL PUMP) 20.25 MG/ACT (1.62%) GEL Place 1 application onto the skin daily.      . valsartan-hydrochlorothiazide (DIOVAN-HCT) 160-25 MG per tablet Take 1 tablet by mouth daily.      Marland Kitchen VITAMIN D, CHOLECALCIFEROL, PO Take 2,000 mg by mouth daily.      . vitamin E 1000 UNIT capsule Take 1,000 Units by mouth daily.      . [DISCONTINUED] Amlodipine-Valsartan-HCTZ 10-160-25 MG TABS Take 1 tablet by mouth daily.  90 tablet  3  . [DISCONTINUED] TraMADol HCl 50 MG TBDP Take by mouth every 6 (six) hours as needed.       No facility-administered encounter medications on file as of 04/12/2012.    Review of Systems  Constitutional: Negative.   HENT: Negative.   Eyes: Negative.   Respiratory: Negative.    Cardiovascular: Negative.   Gastrointestinal: Negative.   Musculoskeletal: Negative.   Skin: Negative.   Neurological: Negative.   Psychiatric/Behavioral: Negative.   All other systems reviewed and are negative.    BP 138/92  Pulse 84  Ht 5' 11.5" (1.816 m)  Wt 236 lb 12 oz (107.389 kg)  BMI 32.56 kg/m2  Physical Exam  Nursing note and vitals reviewed. Constitutional: He is oriented to person, place, and time. He appears well-developed and well-nourished.  HENT:  Head: Normocephalic.  Nose: Nose normal.  Mouth/Throat: Oropharynx is clear and moist.  Eyes: Conjunctivae are normal. Pupils are equal, round, and reactive to light.  Neck: Normal range of motion. Neck supple. No JVD present.  Cardiovascular: Normal rate, regular rhythm, S1 normal, S2 normal, normal heart sounds and intact distal pulses.  Exam reveals no gallop and no friction rub.   No murmur heard. Pulmonary/Chest: Effort normal and breath sounds normal. No respiratory distress. He has no wheezes. He has no rales. He exhibits no tenderness.  Abdominal: Soft. Bowel sounds are normal. He exhibits no distension. There is no tenderness.  Musculoskeletal: Normal range of motion. He exhibits no edema and no tenderness.  Lymphadenopathy:    He has no cervical adenopathy.  Neurological: He is alert and oriented to person, place, and time. Coordination normal.  Skin: Skin is warm and dry. No rash noted. No erythema.  Psychiatric: He has a normal mood and affect. His behavior is normal. Judgment and thought content normal.      Assessment and Plan

## 2012-05-05 ENCOUNTER — Ambulatory Visit: Payer: BC Managed Care – PPO | Admitting: Cardiovascular Disease

## 2012-05-26 ENCOUNTER — Other Ambulatory Visit: Payer: Self-pay | Admitting: Cardiovascular Disease

## 2012-05-26 ENCOUNTER — Other Ambulatory Visit (HOSPITAL_COMMUNITY): Payer: Self-pay | Admitting: Family Medicine

## 2012-05-26 NOTE — Telephone Encounter (Signed)
Refilled Pantoprazole sent to [facility].

## 2012-05-27 ENCOUNTER — Other Ambulatory Visit: Payer: Self-pay | Admitting: Internal Medicine

## 2012-05-27 NOTE — Telephone Encounter (Signed)
Patient will be out of his medication tomorrow   escitalopram (LEXAPRO) 20 MG tablet  # 90

## 2012-05-28 MED ORDER — ESCITALOPRAM OXALATE 20 MG PO TABS: 20.0000 mg | ORAL_TABLET | Freq: Every day | ORAL | Status: DC

## 2012-06-11 ENCOUNTER — Telehealth: Payer: Self-pay | Admitting: *Deleted

## 2012-06-11 DIAGNOSIS — D696 Thrombocytopenia, unspecified: Secondary | ICD-10-CM

## 2012-06-11 DIAGNOSIS — E7800 Pure hypercholesterolemia, unspecified: Secondary | ICD-10-CM

## 2012-06-11 DIAGNOSIS — I10 Essential (primary) hypertension: Secondary | ICD-10-CM

## 2012-06-11 NOTE — Telephone Encounter (Signed)
Pt is coming in for labs Monday 04.28.2014 what labs and dx code would you like ?  Thank you

## 2012-06-12 NOTE — Telephone Encounter (Signed)
Labs ordered. Thanks!

## 2012-06-14 ENCOUNTER — Other Ambulatory Visit (INDEPENDENT_AMBULATORY_CARE_PROVIDER_SITE_OTHER): Payer: BC Managed Care – PPO

## 2012-06-14 ENCOUNTER — Other Ambulatory Visit: Payer: Self-pay | Admitting: Cardiovascular Disease

## 2012-06-14 DIAGNOSIS — I10 Essential (primary) hypertension: Secondary | ICD-10-CM

## 2012-06-14 DIAGNOSIS — E7800 Pure hypercholesterolemia, unspecified: Secondary | ICD-10-CM

## 2012-06-14 DIAGNOSIS — D696 Thrombocytopenia, unspecified: Secondary | ICD-10-CM

## 2012-06-14 LAB — HEPATIC FUNCTION PANEL
Bilirubin, Direct: 0.1 mg/dL (ref 0.0–0.3)
Total Bilirubin: 0.6 mg/dL (ref 0.3–1.2)
Total Protein: 6.6 g/dL (ref 6.0–8.3)

## 2012-06-14 LAB — CBC WITH DIFFERENTIAL/PLATELET
Basophils Absolute: 0 10*3/uL (ref 0.0–0.1)
Eosinophils Absolute: 0.1 10*3/uL (ref 0.0–0.7)
HCT: 41.7 % (ref 39.0–52.0)
Lymphocytes Relative: 25.2 % (ref 12.0–46.0)
Lymphs Abs: 1.3 10*3/uL (ref 0.7–4.0)
MCHC: 34.5 g/dL (ref 30.0–36.0)
MCV: 93.6 fl (ref 78.0–100.0)
Neutro Abs: 3.4 10*3/uL (ref 1.4–7.7)
Platelets: 135 10*3/uL — ABNORMAL LOW (ref 150.0–400.0)
RBC: 4.46 Mil/uL (ref 4.22–5.81)
RDW: 13.6 % (ref 11.5–14.6)
WBC: 5.2 10*3/uL (ref 4.5–10.5)

## 2012-06-14 LAB — BASIC METABOLIC PANEL
BUN: 23 mg/dL (ref 6–23)
Chloride: 105 mEq/L (ref 96–112)
Glucose, Bld: 102 mg/dL — ABNORMAL HIGH (ref 70–99)
Potassium: 3.9 mEq/L (ref 3.5–5.1)

## 2012-06-14 LAB — LIPID PANEL
HDL: 27.5 mg/dL — ABNORMAL LOW (ref >39.00)
LDL Cholesterol: 67 mg/dL (ref 0–99)
VLDL: 14.6 mg/dL (ref 0.0–40.0)

## 2012-06-14 LAB — TSH: TSH: 1.68 u[IU]/mL (ref 0.35–5.50)

## 2012-06-14 NOTE — Telephone Encounter (Signed)
Refilled Atorvastatin sent to [facility] pharmacy. 

## 2012-06-15 ENCOUNTER — Encounter: Payer: Self-pay | Admitting: Internal Medicine

## 2012-06-15 ENCOUNTER — Other Ambulatory Visit: Payer: BC Managed Care – PPO

## 2012-06-17 ENCOUNTER — Telehealth: Payer: Self-pay

## 2012-06-17 NOTE — Telephone Encounter (Signed)
MyChart message: I reviewed your recent lab results. Your platelet count is slightly decreased, but when reviewing your previous labs - platelet count is stable. Your cholesterol is ok except for your HDL (good cholesterol) is slightly decreased. Exercise will increase the good cholesterol. One of your liver test is just slightly elevated. We will continue to follow. I will recheck this at your upcoming appointment - to confirm remaining stable/normal. Your other labs are ok.   Notified patient about his recent labs.

## 2012-06-18 ENCOUNTER — Encounter: Payer: BC Managed Care – PPO | Admitting: Internal Medicine

## 2012-06-22 ENCOUNTER — Other Ambulatory Visit: Payer: Self-pay | Admitting: Internal Medicine

## 2012-06-22 ENCOUNTER — Other Ambulatory Visit (HOSPITAL_COMMUNITY): Payer: Self-pay | Admitting: Family Medicine

## 2012-06-22 NOTE — Telephone Encounter (Signed)
Rx sent to pharmacy by escript  

## 2012-06-24 ENCOUNTER — Encounter: Payer: Self-pay | Admitting: Internal Medicine

## 2012-06-24 ENCOUNTER — Ambulatory Visit (INDEPENDENT_AMBULATORY_CARE_PROVIDER_SITE_OTHER): Payer: BC Managed Care – PPO | Admitting: Internal Medicine

## 2012-06-24 VITALS — BP 110/80 | HR 107 | Temp 98.4°F | Ht 73.0 in | Wt 235.8 lb

## 2012-06-24 DIAGNOSIS — I2510 Atherosclerotic heart disease of native coronary artery without angina pectoris: Secondary | ICD-10-CM

## 2012-06-24 DIAGNOSIS — G4730 Sleep apnea, unspecified: Secondary | ICD-10-CM

## 2012-06-24 DIAGNOSIS — D696 Thrombocytopenia, unspecified: Secondary | ICD-10-CM

## 2012-06-24 DIAGNOSIS — E785 Hyperlipidemia, unspecified: Secondary | ICD-10-CM

## 2012-06-24 DIAGNOSIS — F411 Generalized anxiety disorder: Secondary | ICD-10-CM

## 2012-06-24 DIAGNOSIS — I10 Essential (primary) hypertension: Secondary | ICD-10-CM

## 2012-06-24 DIAGNOSIS — K769 Liver disease, unspecified: Secondary | ICD-10-CM

## 2012-06-24 DIAGNOSIS — F419 Anxiety disorder, unspecified: Secondary | ICD-10-CM

## 2012-06-24 DIAGNOSIS — N200 Calculus of kidney: Secondary | ICD-10-CM

## 2012-06-24 DIAGNOSIS — K219 Gastro-esophageal reflux disease without esophagitis: Secondary | ICD-10-CM

## 2012-06-24 DIAGNOSIS — R945 Abnormal results of liver function studies: Secondary | ICD-10-CM

## 2012-06-24 DIAGNOSIS — R454 Irritability and anger: Secondary | ICD-10-CM

## 2012-06-24 DIAGNOSIS — R748 Abnormal levels of other serum enzymes: Secondary | ICD-10-CM

## 2012-06-24 MED ORDER — PANTOPRAZOLE SODIUM 40 MG PO TBEC: 40.0000 mg | DELAYED_RELEASE_TABLET | Freq: Every day | ORAL | Status: DC

## 2012-06-24 MED ORDER — ESCITALOPRAM OXALATE 20 MG PO TABS: 20.0000 mg | ORAL_TABLET | Freq: Every day | ORAL | Status: DC

## 2012-06-24 MED ORDER — ALLOPURINOL 100 MG PO TABS: 100.0000 mg | ORAL_TABLET | Freq: Two times a day (BID) | ORAL | Status: DC

## 2012-06-24 MED ORDER — FLUTICASONE PROPIONATE 50 MCG/ACT NA SUSP: 2.0000 | Freq: Every day | NASAL | Status: DC

## 2012-06-25 ENCOUNTER — Encounter: Payer: Self-pay | Admitting: Internal Medicine

## 2012-06-25 DIAGNOSIS — D696 Thrombocytopenia, unspecified: Secondary | ICD-10-CM | POA: Insufficient documentation

## 2012-06-25 DIAGNOSIS — R945 Abnormal results of liver function studies: Secondary | ICD-10-CM | POA: Insufficient documentation

## 2012-06-25 NOTE — Progress Notes (Signed)
Subjective:    Patient ID: Keith Gross, male    DOB: 09-21-1955, 57 y.o.   MRN: [id number]  HPI 57 year old male with past history of CAD with stent placed 5/13 (followed by Dr Mariah Milling), hypercholesterolemia, hypertension, nephrolithiasis, gout and sleep apnea.  He comes in today to follow up on these issues as well as for a complete physical exam.  States he had to go off androgel because his insurance would not cover the mediation.  Noticed a big difference in his energy level, etc - off the medication.  Went back on the androgel several days ago.  Fees better.  Energy better.  Sees Dr Achilles Dunk.  He follows his PSA and follows him for kidney stones.  Was having some nocturia.  On flomax and doing well.  Did receive an injection in his left shoulder.  Off antiinflammatories.  Shoulder doing better.  Sleeping better.  Using his CPAP.  Needs a new mask and strap.  Overall he feels he is doing well.    Past Medical History  Diagnosis Date  . GERD (gastroesophageal reflux disease)   . Hypertension   . Anxiety   . Gout     Multiple episodes  . Dysplastic polyp of colon 2008    [facility]  . Treadmill stress test negative for angina pectoris 2011  . Syncopal episodes   . Hyperlipidemia   . Sleep apnea     on CPAP  . Angina   . Myocardial infarction 05/2011    "we think";  06/2011 abnl mv;  06/2011 Cath - subtotal occlusion of Ramus, otw nonobs dzs;  06/2011 PCI/DES of Ramus w/ 2.25x84mm Resolute DES   . Kidney stones   . Diastasis of muscle 2011    "abdominal"    Current Outpatient Prescriptions on File Prior to Visit  Medication Sig Dispense Refill  . ALPRAZolam (XANAX) 0.5 MG tablet 1/2 tablet by mouth twice a day      . amLODipine (NORVASC) 10 MG tablet Take 10 mg by mouth at bedtime.      . Amlodipine-Valsartan-HCTZ 10-320-25 MG TABS Take 1 tablet by mouth daily.  30 tablet  11  . aspirin EC 81 MG tablet Take 81 mg by mouth daily.       Marland Kitchen atorvastatin (LIPITOR) 20 MG tablet TAKE 1 TABLET (20 MG  TOTAL) BY MOUTH DAILY.  30 tablet  3  . CINNAMON PO Take 1,000 mg by mouth 2 (two) times daily.       . clopidogrel (PLAVIX) 75 MG tablet Take 1 tablet (75 mg total) by mouth daily.  30 tablet  6  . Cyanocobalamin (VITAMIN B 12 PO) Take by mouth daily.      Marland Kitchen docusate sodium (COLACE) 100 MG capsule Take 100 mg by mouth 2 (two) times daily.       . fexofenadine (ALLEGRA) 180 MG tablet Take 180 mg by mouth daily.       Marland Kitchen FIBER PO Take 2 tablets by mouth daily.      . hydrochlorothiazide (HYDRODIURIL) 25 MG tablet Take 1 tablet (25 mg total) by mouth at bedtime.  90 tablet  3  . Hydrocodone-Chlorpheniramine 5-4 MG/5ML SOLN Take by mouth.      . magnesium oxide (MAG-OX) 400 MG tablet Take 400 mg by mouth daily.      . metoprolol tartrate (LOPRESSOR) 25 MG tablet 1/2 tablet by mouth daily      . nitroGLYCERIN (NITROSTAT) 0.4 MG SL tablet Place 1 tablet (  0.4 mg total) under the tongue every 5 (five) minutes as needed for chest pain.  25 tablet  3  . Omega-3 Fatty Acids (FISH OIL) 1000 MG CAPS One every other day      . potassium citrate (UROCIT-K) 10 MEQ (1080 MG) SR tablet Take 10 mEq by mouth 2 (two) times daily.       . Testosterone (ANDROGEL PUMP) 20.25 MG/ACT (1.62%) GEL Place 1 application onto the skin daily.      . valsartan-hydrochlorothiazide (DIOVAN-HCT) 160-25 MG per tablet Take 1 tablet by mouth daily.      Marland Kitchen VITAMIN D, CHOLECALCIFEROL, PO Take 2,000 mg by mouth daily.      . vitamin E 1000 UNIT capsule Take 1,000 Units by mouth daily.       No current facility-administered medications on file prior to visit.    Review of Systems Patient denies any headache, lightheadedness or dizziness.  No significant sinus or allergy symptoms currently.  No chest pain, tightness or palpitations.  No increased shortness of breath, cough or congestion.  Breathing is stable.  Using CPAP regularly.  No nausea or vomiting.  Acid reflux controlled.  No abdominal pain or cramping.  No bowel change, such as  diarrhea, constipation, BRBPR or melana.  On flomax and doing well.  Handling stress well.  Blood pressure has been doing well.       Objective:   Physical Exam  Filed Vitals:   06/24/12 0824  BP: 110/80  Pulse: 107  Temp: 98.4 F (36.9 C)   Blood pressure recheck:  45/55  57 year old male in no acute distress.  HEENT:  Nares - clear.  Oropharynx - without lesions. NECK:  Supple.  Nontender.  No audible carotid bruit.  HEART:  Appears to be regular.   LUNGS:  No crackles or wheezing audible.  Respirations even and unlabored.   RADIAL PULSE:  Equal bilaterally.  ABDOMEN:  Soft.  Nontender.  Bowel sounds present and normal.  No audible abdominal bruit.  GU:  Performed through urology.    EXTREMITIES:  No increased edema present.  DP pulses palpable and equal bilaterally.     SKIN: no rash.       Assessment & Plan:  SHOULDER PAIN.  S/p injection.  Doing well.  Follow.   HEALTH MAINTENANCE.  Gets his prostate checks and PSA through urology.  Colonoscopy - Dr Devona Konig.  Overdue.  Will pursue after he is able to come off plavix.

## 2012-06-25 NOTE — Assessment & Plan Note (Signed)
Followed by Dr Achilles Dunk.  Currently doing well.

## 2012-06-25 NOTE — Assessment & Plan Note (Signed)
Has known disease s/p stent placement.  Stent placed in 5/13.  We discussed avoiding any non emergent surgeries/procedures for one year total.  Hold on colonoscopy.  Currently doing well and exercising.  Follow.

## 2012-06-25 NOTE — Assessment & Plan Note (Signed)
On allopurinol.  Stable.  Follow.

## 2012-06-25 NOTE — Assessment & Plan Note (Signed)
Blood pressure is doing well.  Same med regimen.  Follow.    

## 2012-06-25 NOTE — Assessment & Plan Note (Signed)
Platelets recently checked and decreased.  In reviewing labs, platelets stable.  Will check abdominal ultrasound to evaluate spleen.

## 2012-06-25 NOTE — Assessment & Plan Note (Signed)
Stable.  Check abdominal ultrasound.

## 2012-06-25 NOTE — Assessment & Plan Note (Signed)
Low cholesterol diet and exercise.  Follow lipid profile.    

## 2012-06-25 NOTE — Assessment & Plan Note (Signed)
Not an issue for him now.  Doing better.

## 2012-06-25 NOTE — Assessment & Plan Note (Signed)
Doing well.  Continue lexapro.  Follow. Sleeping better.

## 2012-06-25 NOTE — Assessment & Plan Note (Signed)
Uses his CPAP regularly.  Follow.  rx given for a new mask and strap.

## 2012-06-25 NOTE — Assessment & Plan Note (Signed)
Controlled on current regimen.  Follow.   Off antiinflammatories.

## 2012-06-29 ENCOUNTER — Ambulatory Visit: Payer: Self-pay | Admitting: Internal Medicine

## 2012-07-07 IMAGING — CR DG ABDOMEN 1V
1 series · 3 of 3 positions shown · non-contrast
Comparison: none

REASON FOR EXAM: nephrolithaisis
COMMENTS:

PROCEDURE:     [facility] - KDXR KIDNEY URETER BLADDER  - October 11, 2009 [DATE]
RESULT:     Comparisons:  09/03/2009

[Series 2: view not recorded · 0.17mm/px · 3 of 3 slices shown]
[im 1/3]
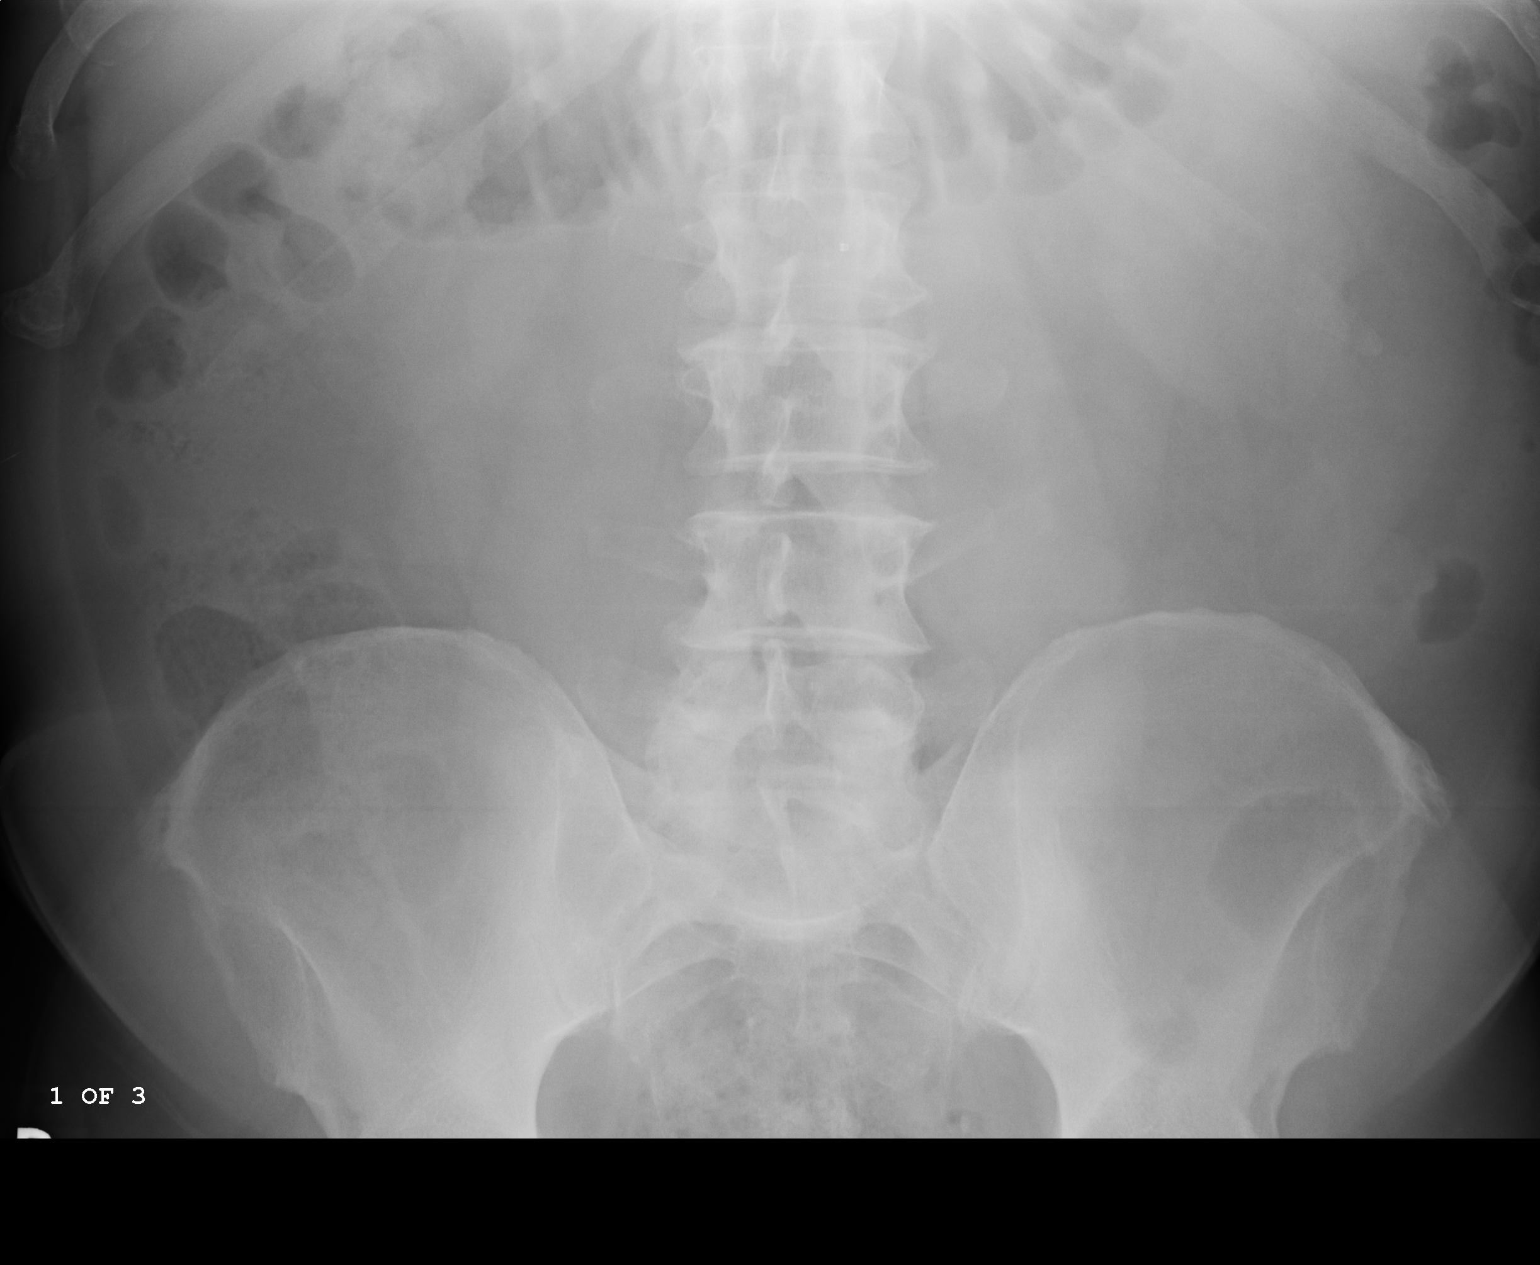
[im 2/3]
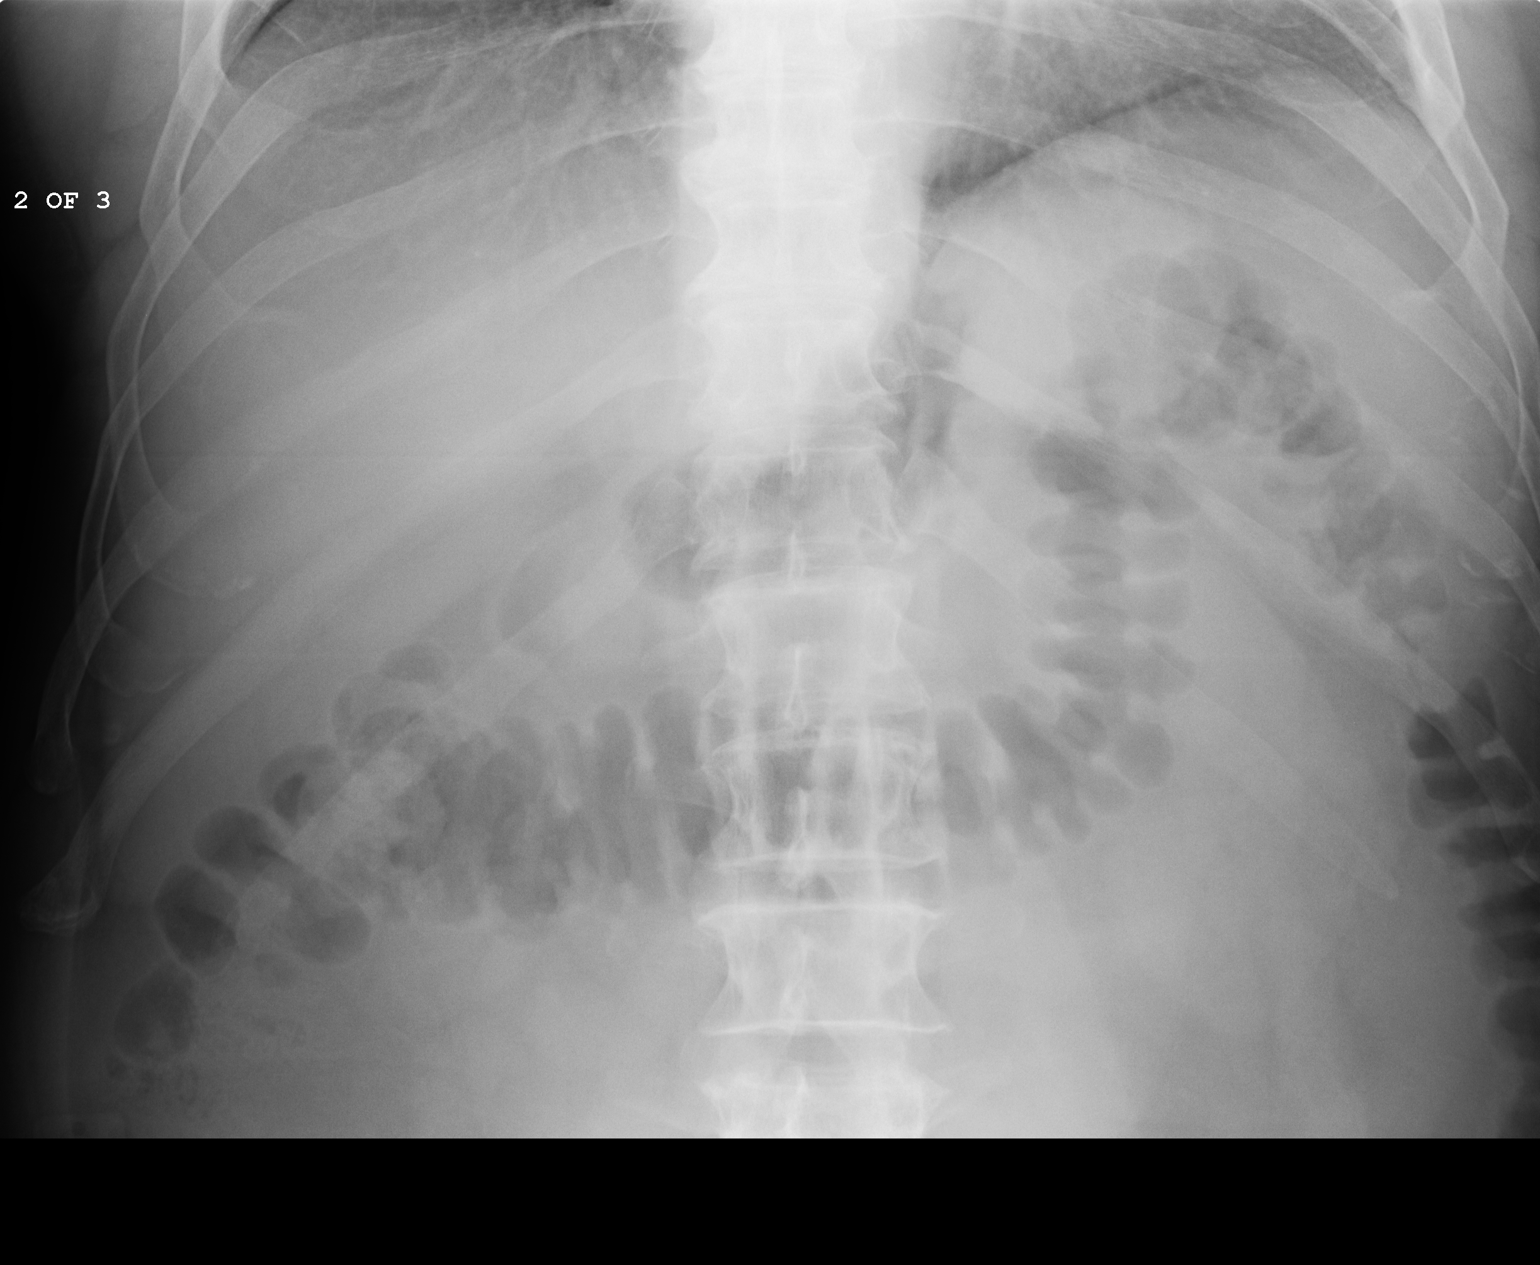
[im 3/3]
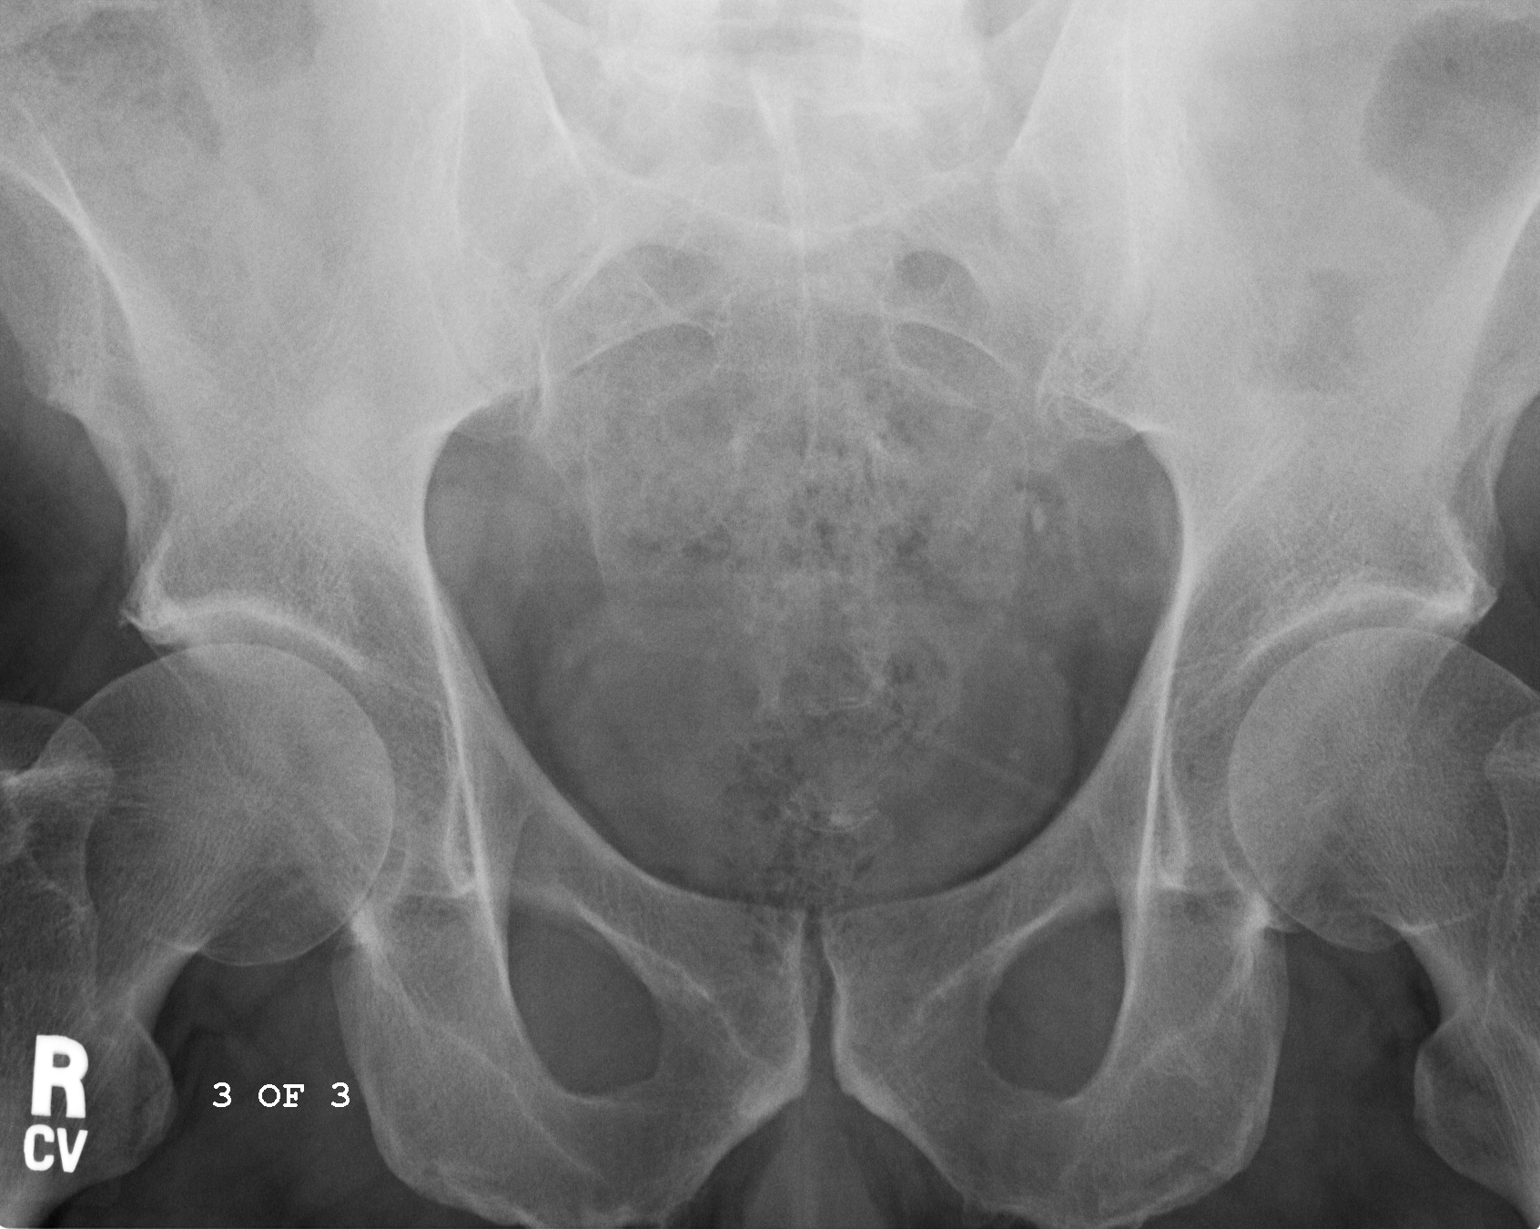

[3 of 3 positions shown; findings below may reference images not displayed]

FINDINGS: Supine radiograph of the abdomen is provided.

There is a nonspecific bowel gas pattern. There is no bowel dilatation to
suggest obstruction. There is a 5 mm calcification projecting over the
expected region of the left distal ureter likely representing a distal left
ureteral calculus. There is no evidence of pneumoperitoneum, portal venous
gas, or pneumatosis.

The osseous structures are unremarkable.
IMPRESSION: There is a 5 mm calcification projecting over the expected region of the
left distal ureter likely representing a distal left ureteral calculus.

## 2012-07-20 ENCOUNTER — Encounter: Payer: Self-pay | Admitting: Cardiovascular Disease

## 2012-07-22 ENCOUNTER — Encounter: Payer: Self-pay | Admitting: Internal Medicine

## 2012-07-30 ENCOUNTER — Encounter: Payer: Self-pay | Admitting: Internal Medicine

## 2012-09-27 ENCOUNTER — Ambulatory Visit (INDEPENDENT_AMBULATORY_CARE_PROVIDER_SITE_OTHER): Payer: BC Managed Care – PPO | Admitting: Cardiovascular Disease

## 2012-09-27 ENCOUNTER — Encounter: Payer: Self-pay | Admitting: Cardiovascular Disease

## 2012-09-27 VITALS — BP 138/94 | HR 83 | Ht 72.0 in | Wt 241.8 lb

## 2012-09-27 DIAGNOSIS — F419 Anxiety disorder, unspecified: Secondary | ICD-10-CM

## 2012-09-27 DIAGNOSIS — R0789 Other chest pain: Secondary | ICD-10-CM

## 2012-09-27 DIAGNOSIS — R454 Irritability and anger: Secondary | ICD-10-CM

## 2012-09-27 DIAGNOSIS — R079 Chest pain, unspecified: Secondary | ICD-10-CM

## 2012-09-27 DIAGNOSIS — F411 Generalized anxiety disorder: Secondary | ICD-10-CM

## 2012-09-27 DIAGNOSIS — I2510 Atherosclerotic heart disease of native coronary artery without angina pectoris: Secondary | ICD-10-CM

## 2012-09-27 DIAGNOSIS — G4730 Sleep apnea, unspecified: Secondary | ICD-10-CM

## 2012-09-27 MED ORDER — HYDROCHLOROTHIAZIDE 25 MG PO TABS: 25.0000 mg | ORAL_TABLET | Freq: Every day | ORAL | Status: DC

## 2012-09-27 MED ORDER — ATORVASTATIN CALCIUM 20 MG PO TABS: ORAL_TABLET | ORAL | Status: DC

## 2012-09-27 MED ORDER — PANTOPRAZOLE SODIUM 40 MG PO TBEC: 40.0000 mg | DELAYED_RELEASE_TABLET | Freq: Every day | ORAL | Status: DC

## 2012-09-27 MED ORDER — AMLODIPINE BESYLATE 10 MG PO TABS: 10.0000 mg | ORAL_TABLET | Freq: Every day | ORAL | Status: DC

## 2012-09-27 MED ORDER — METOPROLOL TARTRATE 25 MG PO TABS: ORAL_TABLET | ORAL | Status: DC

## 2012-09-27 MED ORDER — CLOPIDOGREL BISULFATE 75 MG PO TABS: 75.0000 mg | ORAL_TABLET | Freq: Every day | ORAL | Status: DC

## 2012-09-27 MED ORDER — VALSARTAN-HYDROCHLOROTHIAZIDE 320-25 MG PO TABS: 1.0000 | ORAL_TABLET | Freq: Every day | ORAL | Status: DC

## 2012-09-27 MED ORDER — ISOSORBIDE DINITRATE 20 MG PO TABS: 20.0000 mg | ORAL_TABLET | Freq: Three times a day (TID) | ORAL | Status: DC | PRN

## 2012-09-27 MED ORDER — NITROGLYCERIN 0.4 MG SL SUBL: 0.4000 mg | SUBLINGUAL_TABLET | SUBLINGUAL | Status: DC | PRN

## 2012-09-27 NOTE — Assessment & Plan Note (Signed)
Atypical type chest pain. No further workup at this time. Most of his symptoms occur in the setting of work, otherwise he is exercising several times a week with no anginal symptoms with exertion

## 2012-09-27 NOTE — Assessment & Plan Note (Addendum)
Suspect he has a significant problem with work anxiety. He is unable to change jobs as this is his livelihood. Uncertain if he needs more Xanax for work, alternate SSRI or counseling. Suggested he discuss with Dr. Lorin Picket. He was curious about applying for disability. Do not think he would obtain disability for cardiac issues.

## 2012-09-27 NOTE — Progress Notes (Signed)
Patient ID: Keith Gross, male    DOB: 1955/11/30, 57 y.o.   MRN: [id number]  HPI Comments: Keith Gross is a very pleasant 57 year old gentleman, patient of Dr. Dale [place] and Assunta Gambles, remote history of syncope in 2010 felt secondary to low blood pressure that improved with medication changes, presenting to Dr. Katrinka Blazing  with chest pain symptoms at the beginning of  April 2013, stress test showing inferior lateral hypoperfusion consistent with large region of scar which is new from 2010, cardiac catheterization showing severe ramus disease, with DES stent placed in May 2013.  He presents for routine followup. He is exercising at least 2 days per week with a trainer and his wife. Reports lifting weights, doing aerobic activity. No significant chest pain when working out. Also works out with aerobic on other days.  His biggest complaint is chest pain, sweating, anxiety, agitation when he has to work. Unable to handle going to the office and difficult time working at home. He feels that he is burned out from his job and unable to handle it at this time. Wife reports he gets angry at clients and intact no longer answers the phone out of fear he will say something bad. He had periods of anger and irritability previously, worse now. She does take nitroglycerin when necessary. Chest pain seems to happen while on the couch or in his office at work rarely when working out.  he states that he is using Xanax and taking Lexapro but with no improvement of his symptoms. He is very concerned about 6 weeks from now when Medicare reenrollment will start.   Is wearing his CPAP for structures sleep apnea  He does have a previous history of smoking, quit in 1992. Father had his first heart attack at age 48.  EKG shows normal sinus rhythm with rate 83 beats per minute with old inferior MI  Outpatient Encounter Prescriptions as of 09/27/2012  Medication Sig Dispense Refill  . allopurinol (ZYLOPRIM) 100 MG tablet Take  1 tablet (100 mg total) by mouth 2 (two) times daily.  180 tablet  3  . ALPRAZolam (XANAX) 0.5 MG tablet 1/2 tablet by mouth twice a day      . amLODipine (NORVASC) 10 MG tablet Take 10 mg by mouth at bedtime.      Marland Kitchen aspirin EC 81 MG tablet Take 81 mg by mouth daily.       Marland Kitchen atorvastatin (LIPITOR) 20 MG tablet TAKE 1 TABLET (20 MG TOTAL) BY MOUTH DAILY.  30 tablet  3  . CINNAMON PO Take 1,000 mg by mouth 2 (two) times daily.       . clopidogrel (PLAVIX) 75 MG tablet Take 1 tablet (75 mg total) by mouth daily.  30 tablet  6  . docusate sodium (COLACE) 100 MG capsule Take 100 mg by mouth 2 (two) times daily.       Marland Kitchen escitalopram (LEXAPRO) 20 MG tablet Take 1 tablet (20 mg total) by mouth daily.  90 tablet  1  . fexofenadine (ALLEGRA) 180 MG tablet Take 180 mg by mouth daily.       Marland Kitchen FIBER PO Take 2 tablets by mouth daily.      . fluticasone (FLONASE) 50 MCG/ACT nasal spray Place 2 sprays into the nose daily.  48 g  3  . hydrochlorothiazide (HYDRODIURIL) 25 MG tablet Take 1 tablet (25 mg total) by mouth at bedtime.  90 tablet  3  . magnesium oxide (MAG-OX) 400 MG tablet  Take 400 mg by mouth daily.      . metoprolol tartrate (LOPRESSOR) 25 MG tablet 1/2 tablet by mouth daily      . nitroGLYCERIN (NITROSTAT) 0.4 MG SL tablet Place 1 tablet (0.4 mg total) under the tongue every 5 (five) minutes as needed for chest pain.  25 tablet  3  . pantoprazole (PROTONIX) 40 MG tablet Take 1 tablet (40 mg total) by mouth daily.  90 tablet  3  . potassium citrate (UROCIT-K) 10 MEQ (1080 MG) SR tablet Take 10 mEq by mouth 2 (two) times daily.       . tamsulosin (FLOMAX) 0.4 MG CAPS Take 0.4 mg by mouth daily.      . Testosterone (ANDROGEL PUMP) 20.25 MG/ACT (1.62%) GEL Place 1 application onto the skin daily.      . valsartan-hydrochlorothiazide (DIOVAN-HCT) 320-25 MG per tablet Take 1 tablet by mouth daily.      Marland Kitchen VITAMIN D, CHOLECALCIFEROL, PO Take 2,000 mg by mouth daily.      . vitamin E 1000 UNIT capsule Take  1,000 Units by mouth daily.      . Hydrocodone-Chlorpheniramine 5-4 MG/5ML SOLN Take by mouth as needed.       . [DISCONTINUED] Amlodipine-Valsartan-HCTZ 16-109-60 MG TABS Take 1 tablet by mouth daily.  30 tablet  11  . [DISCONTINUED] Cyanocobalamin (VITAMIN B 12 PO) Take by mouth daily.      . [DISCONTINUED] Omega-3 Fatty Acids (FISH OIL) 1000 MG CAPS One every other day      . [DISCONTINUED] valsartan-hydrochlorothiazide (DIOVAN-HCT) 160-25 MG per tablet Take 1 tablet by mouth daily.       No facility-administered encounter medications on file as of 09/27/2012.    Review of Systems  Constitutional: Negative.   HENT: Negative.   Eyes: Negative.   Respiratory: Positive for chest tightness.   Cardiovascular: Negative.   Gastrointestinal: Negative.   Musculoskeletal: Negative.   Skin: Negative.   Neurological: Negative.   Psychiatric/Behavioral: The patient is nervous/anxious.   All other systems reviewed and are negative.    BP 138/94  Pulse 83  Ht 6' (1.829 m)  Wt 241 lb 12 oz (109.657 kg)  BMI 32.78 kg/m2  Physical Exam  Nursing note and vitals reviewed. Constitutional: He is oriented to person, place, and time. He appears well-developed and well-nourished.  HENT:  Head: Normocephalic.  Nose: Nose normal.  Mouth/Throat: Oropharynx is clear and moist.  Eyes: Conjunctivae are normal. Pupils are equal, round, and reactive to light.  Neck: Normal range of motion. Neck supple. No JVD present.  Cardiovascular: Normal rate, regular rhythm, S1 normal, S2 normal, normal heart sounds and intact distal pulses.  Exam reveals no gallop and no friction rub.   No murmur heard. Pulmonary/Chest: Effort normal and breath sounds normal. No respiratory distress. He has no wheezes. He has no rales. He exhibits no tenderness.  Abdominal: Soft. Bowel sounds are normal. He exhibits no distension. There is no tenderness.  Musculoskeletal: Normal range of motion. He exhibits no edema and no  tenderness.  Lymphadenopathy:    He has no cervical adenopathy.  Neurological: He is alert and oriented to person, place, and time. Coordination normal.  Skin: Skin is warm and dry. No rash noted. No erythema.  Psychiatric: He has a normal mood and affect. His behavior is normal. Judgment and thought content normal.      Assessment and Plan

## 2012-09-27 NOTE — Patient Instructions (Addendum)
Please try isosorbide as needed for chest tightness  Please call us if you have new issues that need to be addressed before your next appt.  Your physician wants you to follow-up in: 6 months.  You will receive a reminder letter in the mail two months in advance. If you don't receive a letter, please call our office to schedule the follow-up appointment.

## 2012-09-27 NOTE — Assessment & Plan Note (Signed)
Continues to have problems with irritability and anger. He no longer talks to clients on the phone secondary to issues are his wife

## 2012-09-27 NOTE — Assessment & Plan Note (Signed)
Atypical type chest pain, nonexertional. Seems to be associated with work stress

## 2012-09-27 NOTE — Assessment & Plan Note (Signed)
Encouraged him to stay on a CPAP

## 2012-10-27 ENCOUNTER — Ambulatory Visit: Payer: BC Managed Care – PPO | Admitting: Internal Medicine

## 2012-10-29 ENCOUNTER — Encounter: Payer: Self-pay | Admitting: Internal Medicine

## 2012-10-29 ENCOUNTER — Ambulatory Visit (INDEPENDENT_AMBULATORY_CARE_PROVIDER_SITE_OTHER): Payer: BC Managed Care – PPO | Admitting: Internal Medicine

## 2012-10-29 VITALS — BP 130/80 | HR 81 | Temp 98.7°F | Ht 73.0 in | Wt 242.5 lb

## 2012-10-29 DIAGNOSIS — I10 Essential (primary) hypertension: Secondary | ICD-10-CM

## 2012-10-29 DIAGNOSIS — Z23 Encounter for immunization: Secondary | ICD-10-CM

## 2012-10-29 DIAGNOSIS — N200 Calculus of kidney: Secondary | ICD-10-CM

## 2012-10-29 DIAGNOSIS — I2510 Atherosclerotic heart disease of native coronary artery without angina pectoris: Secondary | ICD-10-CM

## 2012-10-29 DIAGNOSIS — G4730 Sleep apnea, unspecified: Secondary | ICD-10-CM

## 2012-10-29 DIAGNOSIS — F411 Generalized anxiety disorder: Secondary | ICD-10-CM

## 2012-10-29 DIAGNOSIS — F419 Anxiety disorder, unspecified: Secondary | ICD-10-CM

## 2012-10-29 DIAGNOSIS — K219 Gastro-esophageal reflux disease without esophagitis: Secondary | ICD-10-CM

## 2012-10-29 DIAGNOSIS — E785 Hyperlipidemia, unspecified: Secondary | ICD-10-CM

## 2012-10-29 DIAGNOSIS — D696 Thrombocytopenia, unspecified: Secondary | ICD-10-CM

## 2012-10-29 MED ORDER — SERTRALINE HCL 50 MG PO TABS: 50.0000 mg | ORAL_TABLET | Freq: Every day | ORAL | Status: DC

## 2012-10-31 ENCOUNTER — Encounter: Payer: Self-pay | Admitting: Internal Medicine

## 2012-10-31 NOTE — Assessment & Plan Note (Signed)
Has known disease s/p stent placement.  Stent placed in 5/13.  We discussed avoiding any non emergent surgeries/procedures for one year total.  Hold on colonoscopy.  Currently doing well and exercising.  Follow.

## 2012-10-31 NOTE — Assessment & Plan Note (Signed)
Blood pressure is doing well.  Same med regimen.  Follow.    

## 2012-10-31 NOTE — Assessment & Plan Note (Signed)
Controlled on current regimen.  Follow.   Off antiinflammatories.

## 2012-10-31 NOTE — Assessment & Plan Note (Signed)
Low cholesterol diet and exercise.  Follow lipid profile.    

## 2012-10-31 NOTE — Assessment & Plan Note (Signed)
Followed by Dr Achilles Dunk.  Currently doing well.

## 2012-10-31 NOTE — Assessment & Plan Note (Signed)
On allopurinol.  Stable.  Follow.

## 2012-10-31 NOTE — Assessment & Plan Note (Signed)
Increased stress and anxiety as outlined.  I feel we need to change his SSRI to one that has less tendency to increase anxiety.  Will have him decrease lexapro to 10mg  q day for five days and start Zoloft 25mg  x 3 days and then increase to 50mg  q day.  Follow closely.  Discussed counseling.  Wants to hold at this point.  Follow.  Get him back in soon to reassess.

## 2012-10-31 NOTE — Assessment & Plan Note (Addendum)
Recent abdominal ultrasound revealed a mildly enlarged spleen.  Will follow.  Follow platelet count.

## 2012-10-31 NOTE — Assessment & Plan Note (Signed)
Uses his CPAP regularly.  Follow.

## 2012-10-31 NOTE — Progress Notes (Signed)
Subjective:    Patient ID: Keith Gross, male    DOB: September 30, 1955, 57 y.o.   MRN: [id number]  HPI 57 year old male with past history of CAD with stent placed 5/13 (followed by Dr Mariah Milling), hypercholesterolemia, hypertension, nephrolithiasis, gout and sleep apnea.  He comes in today for a scheduled follow up.   Sees Dr Achilles Dunk.  He follows his PSA and follows him for kidney stones.  Was having some nocturia.  On flomax and doing well.  Sleeping better.  Using CPAP.  He saw Dr Mariah Milling recently. Felt from a cardiac standpoint - stable.  He is exercising.  No chest pain or tightness with increased activity or exertion.  Breathing stable.  His main complaint/concern is that of increased stress and anxiety.  A lot of stress with his job.  Sees this getting worse over the next several months. Has been on lexapro since 6-7/13.  Feels may need something different.  Increased anxiety.  Keeping him from sleeping at night.  No suicidal ideations.  Still going out.  Still enjoys exercising.     Past Medical History  Diagnosis Date  . GERD (gastroesophageal reflux disease)   . Hypertension   . Anxiety   . Gout     Multiple episodes  . Dysplastic polyp of colon 2008    [facility]  . Treadmill stress test negative for angina pectoris 2011  . Syncopal episodes   . Hyperlipidemia   . Sleep apnea     on CPAP  . Angina   . Myocardial infarction 05/2011    "we think";  06/2011 abnl mv;  06/2011 Cath - subtotal occlusion of Ramus, otw nonobs dzs;  06/2011 PCI/DES of Ramus w/ 2.25x66mm Resolute DES   . Kidney stones   . Diastasis of muscle 2011    "abdominal"    Current Outpatient Prescriptions on File Prior to Visit  Medication Sig Dispense Refill  . allopurinol (ZYLOPRIM) 100 MG tablet Take 1 tablet (100 mg total) by mouth 2 (two) times daily.  180 tablet  3  . ALPRAZolam (XANAX) 0.5 MG tablet 1/2 tablet by mouth twice a day      . amLODipine (NORVASC) 10 MG tablet Take 1 tablet (10 mg total) by mouth at bedtime.  90  tablet  3  . aspirin EC 81 MG tablet Take 81 mg by mouth daily.       Marland Kitchen atorvastatin (LIPITOR) 20 MG tablet TAKE 1 TABLET (20 MG TOTAL) BY MOUTH DAILY.  90 tablet  3  . CINNAMON PO Take 1,000 mg by mouth 2 (two) times daily.       . clopidogrel (PLAVIX) 75 MG tablet Take 1 tablet (75 mg total) by mouth daily.  90 tablet  3  . docusate sodium (COLACE) 100 MG capsule Take 100 mg by mouth 2 (two) times daily.       . fexofenadine (ALLEGRA) 180 MG tablet Take 180 mg by mouth daily.       Marland Kitchen FIBER PO Take 2 tablets by mouth daily.      . fluticasone (FLONASE) 50 MCG/ACT nasal spray Place 2 sprays into the nose daily.  48 g  3  . hydrochlorothiazide (HYDRODIURIL) 25 MG tablet Take 1 tablet (25 mg total) by mouth at bedtime.  90 tablet  3  . Hydrocodone-Chlorpheniramine 5-4 MG/5ML SOLN Take by mouth as needed.       . isosorbide dinitrate (ISORDIL) 20 MG tablet Take 1 tablet (20 mg total) by mouth 3 (  three) times daily as needed.  90 tablet  6  . magnesium oxide (MAG-OX) 400 MG tablet Take 400 mg by mouth daily.      . metoprolol tartrate (LOPRESSOR) 25 MG tablet Take 1/2 tablet daily.  45 tablet  3  . nitroGLYCERIN (NITROSTAT) 0.4 MG SL tablet Place 1 tablet (0.4 mg total) under the tongue every 5 (five) minutes as needed for chest pain.  25 tablet  3  . pantoprazole (PROTONIX) 40 MG tablet Take 1 tablet (40 mg total) by mouth daily.  90 tablet  3  . potassium citrate (UROCIT-K) 10 MEQ (1080 MG) SR tablet Take 10 mEq by mouth 2 (two) times daily.       . tamsulosin (FLOMAX) 0.4 MG CAPS Take 0.4 mg by mouth daily.      . Testosterone (ANDROGEL PUMP) 20.25 MG/ACT (1.62%) GEL Place 1 application onto the skin daily.      . valsartan-hydrochlorothiazide (DIOVAN-HCT) 320-25 MG per tablet Take 1 tablet by mouth daily.  90 tablet  3  . VITAMIN D, CHOLECALCIFEROL, PO Take 2,000 mg by mouth daily.      . vitamin E 1000 UNIT capsule Take 1,000 Units by mouth daily.       No current facility-administered  medications on file prior to visit.    Review of Systems Patient denies any headache, lightheadedness or dizziness.  No significant sinus or allergy symptoms currently.  No significant chest pain, tightness or palpitations.  No increased shortness of breath, cough or congestion.  Breathing is stable.  Using CPAP regularly.  No nausea or vomiting.  Acid reflux controlled.  No abdominal pain or cramping.  No bowel change, such as diarrhea, constipation, BRBPR or melana.  On flomax and doing well.  Blood pressure has been doing well.  Increased stress as outlined.  Feel he needs something different than the lexapro.  Not sleeping.  Increased anxiety.       Objective:   Physical Exam  Filed Vitals:   10/29/12 0921  BP: 130/80  Pulse: 81  Temp: 98.7 F (80.50 C)   57 year old male in no acute distress.  HEENT:  Nares - clear.  Oropharynx - without lesions. NECK:  Supple.  Nontender.  No audible carotid bruit.  HEART:  Appears to be regular.   LUNGS:  No crackles or wheezing audible.  Respirations even and unlabored.   RADIAL PULSE:  Equal bilaterally.  ABDOMEN:  Soft.  Nontender.  Bowel sounds present and normal.  No audible abdominal bruit.    EXTREMITIES:  No increased edema present.  DP pulses palpable and equal bilaterally.     SKIN: no rash.       Assessment & Plan:  SHOULDER PAIN.  S/p injection.  Doing well.  Follow.   HEALTH MAINTENANCE.  Gets his prostate checks and PSA through urology.  Colonoscopy - Dr Devona Konig.  Overdue.  Will pursue after he is able to come off plavix.

## 2012-12-15 ENCOUNTER — Other Ambulatory Visit (INDEPENDENT_AMBULATORY_CARE_PROVIDER_SITE_OTHER): Payer: BC Managed Care – PPO

## 2012-12-15 ENCOUNTER — Encounter: Payer: Self-pay | Admitting: Internal Medicine

## 2012-12-15 DIAGNOSIS — E785 Hyperlipidemia, unspecified: Secondary | ICD-10-CM

## 2012-12-15 DIAGNOSIS — I10 Essential (primary) hypertension: Secondary | ICD-10-CM

## 2012-12-15 DIAGNOSIS — D696 Thrombocytopenia, unspecified: Secondary | ICD-10-CM

## 2012-12-15 LAB — BASIC METABOLIC PANEL
Calcium: 9.2 mg/dL (ref 8.4–10.5)
GFR: 68.45 mL/min (ref >60.00)
Glucose, Bld: 141 mg/dL — ABNORMAL HIGH (ref 70–99)
Potassium: 3.7 mEq/L (ref 3.5–5.1)
Sodium: 140 mEq/L (ref 135–145)

## 2012-12-15 LAB — CBC WITH DIFFERENTIAL/PLATELET
Eosinophils Absolute: 0 10*3/uL (ref 0.0–0.7)
Eosinophils Relative: 0.7 % (ref 0.0–5.0)
HCT: 44.6 % (ref 39.0–52.0)
Hemoglobin: 15.4 g/dL (ref 13.0–17.0)
Lymphs Abs: 1.2 10*3/uL (ref 0.7–4.0)
MCHC: 34.6 g/dL (ref 30.0–36.0)
MCV: 90.7 fl (ref 78.0–100.0)
Monocytes Absolute: 0.5 10*3/uL (ref 0.1–1.0)
Neutro Abs: 4.5 10*3/uL (ref 1.4–7.7)
Neutrophils Relative %: 71.9 % (ref 43.0–77.0)
Platelets: 140 10*3/uL — ABNORMAL LOW (ref 150.0–400.0)
RDW: 12.8 % (ref 11.5–14.6)
WBC: 6.2 10*3/uL (ref 4.5–10.5)

## 2012-12-15 LAB — HEPATIC FUNCTION PANEL
ALT: 30 U/L (ref 0–53)
AST: 32 U/L (ref 0–37)
Bilirubin, Direct: 0.1 mg/dL (ref 0.0–0.3)
Total Bilirubin: 1 mg/dL (ref 0.3–1.2)
Total Protein: 7.1 g/dL (ref 6.0–8.3)

## 2012-12-15 LAB — LIPID PANEL
Cholesterol: 117 mg/dL (ref 0–200)
HDL: 29.6 mg/dL — ABNORMAL LOW (ref >39.00)
LDL Cholesterol: 66 mg/dL (ref 0–99)
Triglycerides: 106 mg/dL (ref 0.0–149.0)

## 2012-12-20 ENCOUNTER — Ambulatory Visit (INDEPENDENT_AMBULATORY_CARE_PROVIDER_SITE_OTHER): Payer: BC Managed Care – PPO | Admitting: Internal Medicine

## 2012-12-20 ENCOUNTER — Encounter: Payer: Self-pay | Admitting: Internal Medicine

## 2012-12-20 VITALS — BP 120/70 | HR 85 | Temp 98.2°F | Ht 73.0 in | Wt 235.2 lb

## 2012-12-20 DIAGNOSIS — F411 Generalized anxiety disorder: Secondary | ICD-10-CM

## 2012-12-20 DIAGNOSIS — M79609 Pain in unspecified limb: Secondary | ICD-10-CM

## 2012-12-20 DIAGNOSIS — I10 Essential (primary) hypertension: Secondary | ICD-10-CM

## 2012-12-20 DIAGNOSIS — M79645 Pain in left finger(s): Secondary | ICD-10-CM

## 2012-12-20 DIAGNOSIS — E785 Hyperlipidemia, unspecified: Secondary | ICD-10-CM

## 2012-12-20 DIAGNOSIS — R945 Abnormal results of liver function studies: Secondary | ICD-10-CM

## 2012-12-20 DIAGNOSIS — I2510 Atherosclerotic heart disease of native coronary artery without angina pectoris: Secondary | ICD-10-CM

## 2012-12-20 DIAGNOSIS — D696 Thrombocytopenia, unspecified: Secondary | ICD-10-CM

## 2012-12-20 DIAGNOSIS — M79672 Pain in left foot: Secondary | ICD-10-CM

## 2012-12-20 DIAGNOSIS — M722 Plantar fascial fibromatosis: Secondary | ICD-10-CM

## 2012-12-20 DIAGNOSIS — F419 Anxiety disorder, unspecified: Secondary | ICD-10-CM

## 2012-12-20 DIAGNOSIS — N200 Calculus of kidney: Secondary | ICD-10-CM

## 2012-12-20 DIAGNOSIS — G4730 Sleep apnea, unspecified: Secondary | ICD-10-CM

## 2012-12-20 DIAGNOSIS — K769 Liver disease, unspecified: Secondary | ICD-10-CM

## 2012-12-20 DIAGNOSIS — K219 Gastro-esophageal reflux disease without esophagitis: Secondary | ICD-10-CM

## 2012-12-20 MED ORDER — SERTRALINE HCL 50 MG PO TABS: ORAL_TABLET | ORAL | Status: DC

## 2012-12-21 ENCOUNTER — Encounter: Payer: Self-pay | Admitting: Internal Medicine

## 2012-12-21 NOTE — Progress Notes (Signed)
Subjective:    Patient ID: Keith Gross, male    DOB: Nov 05, 1955, 57 y.o.   MRN: [id number]  HPI 57 year old male with past history of CAD with stent placed 5/13 (followed by Dr Mariah Milling), hypercholesterolemia, hypertension, nephrolithiasis, gout and sleep apnea.  He comes in today for a scheduled follow up.   Sees Dr Achilles Dunk.  He follows his PSA and follows him for kidney stones.  Was having some nocturia.  On flomax and doing well.  Due to see Dr Achilles Dunk next week.  Sleeping better.  Using CPAP.  He sees Dr Mariah Milling.  Felt from a cardiac standpoint - stable.  He is exercising.  No chest pain or tightness with increased activity or exertion.  Breathing stable.  He has increased stress and anxiety.  A lot of stress with his job.  Increased anxiety.  Was started on zoloft last visit.  He does think this has helped.  Feels overall better.  States his wife feels that he has episodes where he still has increased anxiety.  They were questioning starting wellbutrin.  Has only been on zoloft for 6 weeks.  He is also having some increased pain at the base of his hand and base of his thumb.  Also reports some pain in his left foot.  Is c/w plantar fasciitis.  Has had before and states this feels similar.  Has been doing the stretches and other conservative measures.  Still reports pain.  Wants f/u with Dr Charlsie Merles.  Has seen previously.     Past Medical History  Diagnosis Date  . GERD (gastroesophageal reflux disease)   . Hypertension   . Anxiety   . Gout     Multiple episodes  . Dysplastic polyp of colon 2008    [facility]  . Treadmill stress test negative for angina pectoris 2011  . Syncopal episodes   . Hyperlipidemia   . Sleep apnea     on CPAP  . Angina   . Myocardial infarction 05/2011    "we think";  06/2011 abnl mv;  06/2011 Cath - subtotal occlusion of Ramus, otw nonobs dzs;  06/2011 PCI/DES of Ramus w/ 2.25x40mm Resolute DES   . Kidney stones   . Diastasis of muscle 2011    "abdominal"    Current Outpatient  Prescriptions on File Prior to Visit  Medication Sig Dispense Refill  . allopurinol (ZYLOPRIM) 100 MG tablet Take 1 tablet (100 mg total) by mouth 2 (two) times daily.  180 tablet  3  . ALPRAZolam (XANAX) 0.5 MG tablet 1/2 tablet by mouth twice a day      . amLODipine (NORVASC) 10 MG tablet Take 1 tablet (10 mg total) by mouth at bedtime.  90 tablet  3  . aspirin EC 81 MG tablet Take 81 mg by mouth daily.       Marland Kitchen atorvastatin (LIPITOR) 20 MG tablet TAKE 1 TABLET (20 MG TOTAL) BY MOUTH DAILY.  90 tablet  3  . CINNAMON PO Take 1,000 mg by mouth 2 (two) times daily.       . clopidogrel (PLAVIX) 75 MG tablet Take 1 tablet (75 mg total) by mouth daily.  90 tablet  3  . docusate sodium (COLACE) 100 MG capsule Take 100 mg by mouth 2 (two) times daily.       . fexofenadine (ALLEGRA) 180 MG tablet Take 180 mg by mouth daily.       Marland Kitchen FIBER PO Take 2 tablets by mouth daily.      Marland Kitchen  fluticasone (FLONASE) 50 MCG/ACT nasal spray Place 2 sprays into the nose daily.  48 g  3  . hydrochlorothiazide (HYDRODIURIL) 25 MG tablet Take 1 tablet (25 mg total) by mouth at bedtime.  90 tablet  3  . Hydrocodone-Chlorpheniramine 5-4 MG/5ML SOLN Take by mouth as needed.       . magnesium oxide (MAG-OX) 400 MG tablet Take 400 mg by mouth daily.      . metoprolol tartrate (LOPRESSOR) 25 MG tablet Take 1/2 tablet daily.  45 tablet  3  . nitroGLYCERIN (NITROSTAT) 0.4 MG SL tablet Place 1 tablet (0.4 mg total) under the tongue every 5 (five) minutes as needed for chest pain.  25 tablet  3  . pantoprazole (PROTONIX) 40 MG tablet Take 1 tablet (40 mg total) by mouth daily.  90 tablet  3  . potassium citrate (UROCIT-K) 10 MEQ (1080 MG) SR tablet Take 10 mEq by mouth 2 (two) times daily.       . tamsulosin (FLOMAX) 0.4 MG CAPS Take 0.4 mg by mouth daily.      . Testosterone (ANDROGEL PUMP) 20.25 MG/ACT (1.62%) GEL Place 1 application onto the skin daily.      . valsartan-hydrochlorothiazide (DIOVAN-HCT) 320-25 MG per tablet Take 1  tablet by mouth daily.  90 tablet  3  . VITAMIN D, CHOLECALCIFEROL, PO Take 2,000 mg by mouth daily.      . vitamin E 1000 UNIT capsule Take 1,000 Units by mouth daily.       No current facility-administered medications on file prior to visit.    Review of Systems Patient denies any headache, lightheadedness or dizziness.  No significant sinus or allergy symptoms currently.  No chest pain, tightness or palpitations.  No increased shortness of breath, cough or congestion.  Breathing is stable.  Using CPAP regularly.  No nausea or vomiting.  Acid reflux controlled.  No abdominal pain or cramping.  No bowel change, such as diarrhea, constipation, BRBPR or melana.  On flomax and doing well.  Blood pressure has been doing well.  Increased stress as outlined.  Feels better on the zoloft.  Hand an foot pain as outlined.         Objective:   Physical Exam  Filed Vitals:   12/20/12 1028  BP: 120/70  Pulse: 85  Temp: 98.2 F (36.8 C)   Blood pressure recheck:  82/42  57 year old male in no acute distress.  HEENT:  Nares - clear.  Oropharynx - without lesions. NECK:  Supple.  Nontender.  No audible carotid bruit.  HEART:  Appears to be regular.   LUNGS:  No crackles or wheezing audible.  Respirations even and unlabored.   RADIAL PULSE:  Equal bilaterally.  ABDOMEN:  Soft.  Nontender.  Bowel sounds present and normal.  No audible abdominal bruit.    EXTREMITIES:  No increased edema present.  DP pulses palpable and equal bilaterally.     SKIN: no rash. MSK;  Increased pain at the base of the thumb.  Positive tinels.  Negative phalens.         Assessment & Plan:  SHOULDER PAIN.  S/p injection.  Doing well.  Follow.   HEALTH MAINTENANCE.  Gets his prostate checks and PSA through urology.  Colonoscopy - Dr Devona Konig.  Overdue.  Will pursue after he is able to come off plavix.

## 2012-12-23 ENCOUNTER — Encounter: Payer: Self-pay | Admitting: Internal Medicine

## 2012-12-23 DIAGNOSIS — M79646 Pain in unspecified finger(s): Secondary | ICD-10-CM | POA: Insufficient documentation

## 2012-12-23 DIAGNOSIS — M79672 Pain in left foot: Secondary | ICD-10-CM | POA: Insufficient documentation

## 2012-12-23 NOTE — Assessment & Plan Note (Signed)
Blood pressure is doing well.  Same med regimen.  Follow.    

## 2012-12-23 NOTE — Assessment & Plan Note (Signed)
No recent flares.  On allopurinol.   

## 2012-12-23 NOTE — Assessment & Plan Note (Signed)
Followed by Dr Achilles Dunk.  Currently doing well.

## 2012-12-23 NOTE — Assessment & Plan Note (Signed)
Uses his CPAP regularly.  Follow.

## 2012-12-23 NOTE — Assessment & Plan Note (Signed)
Has known disease s/p stent placement.  Stent placed in 5/13.  We discussed avoiding any non emergent surgeries/procedures for one year total.  Hold on colonoscopy.  Currently doing well and exercising.  Follow.

## 2012-12-23 NOTE — Assessment & Plan Note (Signed)
Pain at the base of the thumb.  He was questioning carpal tunnel syndrome.  May have some carpal tunnel, but it appears that pain is more localized at the base of the thumb.  Thumb spica.  Tylenol as directed.  Call with update. May need injection.

## 2012-12-23 NOTE — Assessment & Plan Note (Signed)
Liver panel 110/29/14 - wnl.

## 2012-12-23 NOTE — Assessment & Plan Note (Signed)
Has had issues with plantar fasciitis in the past.  Similar symptoms now.  Request referral back to Dr Charlsie Merles.  Has tried conservative measures.

## 2012-12-23 NOTE — Assessment & Plan Note (Signed)
Low cholesterol diet and exercise.  Follow lipid profile.    

## 2012-12-23 NOTE — Assessment & Plan Note (Signed)
Recent abdominal ultrasound revealed a mildly enlarged spleen.  Will follow.  Follow platelet count.

## 2012-12-23 NOTE — Assessment & Plan Note (Signed)
Doing better on zoloft.  Will increase to 50mg  1 1/2 tablet q day.  Follow.

## 2012-12-23 NOTE — Assessment & Plan Note (Signed)
Controlled on current regimen.  Follow.   Off antiinflammatories.       

## 2012-12-31 ENCOUNTER — Ambulatory Visit (INDEPENDENT_AMBULATORY_CARE_PROVIDER_SITE_OTHER): Payer: BC Managed Care – PPO

## 2012-12-31 ENCOUNTER — Encounter: Payer: Self-pay | Admitting: Podiatry

## 2012-12-31 ENCOUNTER — Ambulatory Visit (INDEPENDENT_AMBULATORY_CARE_PROVIDER_SITE_OTHER): Payer: BC Managed Care – PPO | Admitting: Podiatry

## 2012-12-31 VITALS — BP 118/74 | HR 92 | Resp 16 | Ht 72.0 in | Wt 225.0 lb

## 2012-12-31 DIAGNOSIS — M79672 Pain in left foot: Secondary | ICD-10-CM

## 2012-12-31 DIAGNOSIS — M79609 Pain in unspecified limb: Secondary | ICD-10-CM

## 2012-12-31 DIAGNOSIS — M722 Plantar fascial fibromatosis: Secondary | ICD-10-CM

## 2012-12-31 DIAGNOSIS — M2020 Hallux rigidus, unspecified foot: Secondary | ICD-10-CM

## 2012-12-31 MED ORDER — TRIAMCINOLONE ACETONIDE 10 MG/ML IJ SUSP
5.0000 mg | Freq: Once | INTRAMUSCULAR | Status: AC
  Administered 2012-12-31: 5 mg via INTRA_ARTICULAR

## 2012-12-31 NOTE — Progress Notes (Signed)
  Subjective:    Patient ID: Keith Gross, male    DOB: October 19, 1955, 57 y.o.   MRN: [id number]  HPI Comments: N PAIN L LEFT HEEL D 1 M O SLOWLY C SAME A STANDING, WALKING, AM BAD T STRETCHING,      Review of Systems  Constitutional: Negative.   HENT: Negative.   Eyes: Negative.   Respiratory: Negative.   Cardiovascular: Negative.   Gastrointestinal: Negative.   Endocrine: Negative.   Genitourinary: Negative.   Musculoskeletal: Negative.   Skin: Negative.   Allergic/Immunologic: Negative.   Neurological: Negative.   Hematological: Negative.   Psychiatric/Behavioral: Negative.        Objective:   Physical Exam        Assessment & Plan:

## 2012-12-31 NOTE — Progress Notes (Signed)
Subjective:     Patient ID: Keith Gross, male   DOB: 01/29/1956, 57 y.o.   MRN: [id number]  HPI patient presents stating I'm having a lot of pain in my left heel that has been present for several months. States that he had a heart attack and that he needs to exercise everyday   Review of Systems  All other systems reviewed and are negative.       Objective:   Physical Exam  Nursing note and vitals reviewed. Constitutional: He is oriented to person, place, and time.  Cardiovascular: Intact distal pulses.   Musculoskeletal: Normal range of motion.  Neurological: He is oriented to person, place, and time.  Skin: Skin is warm.   neurovascular status intact and patient's left heel is found to be very tender in the medial band at the insertion of the tendon into the calcaneus.    Assessment:     Plantar fasciitis left heel insertional    Plan:     H&P and x-ray reviewed. Injected the left plantar fascia 3 mg Kenalog 5 mg Xylocaine Marcaine mixture and applied fascially brace reappoint in 1 week with orthotics to evaluate a new pair

## 2012-12-31 NOTE — Patient Instructions (Signed)
Plantar Fasciitis (Heel Spur Syndrome)  with Rehab  The plantar fascia is a fibrous, ligament-like, soft-tissue structure that spans the bottom of the foot. Plantar fasciitis is a condition that causes pain in the foot due to inflammation of the tissue.  SYMPTOMS   · Pain and tenderness on the underneath side of the foot.  · Pain that worsens with standing or walking.  CAUSES   Plantar fasciitis is caused by irritation and injury to the plantar fascia on the underneath side of the foot. Common mechanisms of injury include:  · Direct trauma to bottom of the foot.  · Damage to a small nerve that runs under the foot where the main fascia attaches to the heel bone.  · Stress placed on the plantar fascia due to bone spurs.  RISK INCREASES WITH:   · Activities that place stress on the plantar fascia (running, jumping, pivoting, or cutting).  · Poor strength and flexibility.  · Improperly fitted shoes.  · Tight calf muscles.  · Flat feet.  · Failure to warm-up properly before activity.  · Obesity.  PREVENTION  · Warm up and stretch properly before activity.  · Allow for adequate recovery between workouts.  · Maintain physical fitness:  · Strength, flexibility, and endurance.  · Cardiovascular fitness.  · Maintain a health body weight.  · Avoid stress on the plantar fascia.  · Wear properly fitted shoes, including arch supports for individuals who have flat feet.  PROGNOSIS   If treated properly, then the symptoms of plantar fasciitis usually resolve without surgery. However, occasionally surgery is necessary.  RELATED COMPLICATIONS   · Recurrent symptoms that may result in a chronic condition.  · Problems of the lower back that are caused by compensating for the injury, such as limping.  · Pain or weakness of the foot during push-off following surgery.  · Chronic inflammation, scarring, and partial or complete fascia tear, occurring more often from repeated injections.  TREATMENT   Treatment initially involves the use of  ice and medication to help reduce pain and inflammation. The use of strengthening and stretching exercises may help reduce pain with activity, especially stretches of the Achilles tendon. These exercises may be performed at home or with a therapist. Your caregiver may recommend that you use heel cups of arch supports to help reduce stress on the plantar fascia. Occasionally, corticosteroid injections are given to reduce inflammation. If symptoms persist for greater than 6 months despite non-surgical (conservative), then surgery may be recommended.   MEDICATION   · If pain medication is necessary, then nonsteroidal anti-inflammatory medications, such as aspirin and ibuprofen, or other minor pain relievers, such as acetaminophen, are often recommended.  · Do not take pain medication within 7 days before surgery.  · Prescription pain relievers may be given if deemed necessary by your caregiver. Use only as directed and only as much as you need.  · Corticosteroid injections may be given by your caregiver. These injections should be reserved for the most serious cases, because they may only be given a certain number of times.  HEAT AND COLD  · Cold treatment (icing) relieves pain and reduces inflammation. Cold treatment should be applied for 10 to 15 minutes every 2 to 3 hours for inflammation and pain and immediately after any activity that aggravates your symptoms. Use ice packs or massage the area with a piece of ice (ice massage).  · Heat treatment may be used prior to performing the stretching and strengthening activities prescribed   by your caregiver, physical therapist, or athletic trainer. Use a heat pack or soak the injury in warm water.  SEEK IMMEDIATE MEDICAL CARE IF:  · Treatment seems to offer no benefit, or the condition worsens.  · Any medications produce adverse side effects.  EXERCISES  RANGE OF MOTION (ROM) AND STRETCHING EXERCISES - Plantar Fasciitis (Heel Spur Syndrome)  These exercises may help you  when beginning to rehabilitate your injury. Your symptoms may resolve with or without further involvement from your physician, physical therapist or athletic trainer. While completing these exercises, remember:   · Restoring tissue flexibility helps normal motion to return to the joints. This allows healthier, less painful movement and activity.  · An effective stretch should be held for at least 30 seconds.  · A stretch should never be painful. You should only feel a gentle lengthening or release in the stretched tissue.  RANGE OF MOTION - Toe Extension, Flexion  · Sit with your right / left leg crossed over your opposite knee.  · Grasp your toes and gently pull them back toward the top of your foot. You should feel a stretch on the bottom of your toes and/or foot.  · Hold this stretch for __________ seconds.  · Now, gently pull your toes toward the bottom of your foot. You should feel a stretch on the top of your toes and or foot.  · Hold this stretch for __________ seconds.  Repeat __________ times. Complete this stretch __________ times per day.   RANGE OF MOTION - Ankle Dorsiflexion, Active Assisted  · Remove shoes and sit on a chair that is preferably not on a carpeted surface.  · Place right / left foot under knee. Extend your opposite leg for support.  · Keeping your heel down, slide your right / left foot back toward the chair until you feel a stretch at your ankle or calf. If you do not feel a stretch, slide your bottom forward to the edge of the chair, while still keeping your heel down.  · Hold this stretch for __________ seconds.  Repeat __________ times. Complete this stretch __________ times per day.   STRETCH  Gastroc, Standing  · Place hands on wall.  · Extend right / left leg, keeping the front knee somewhat bent.  · Slightly point your toes inward on your back foot.  · Keeping your right / left heel on the floor and your knee straight, shift your weight toward the wall, not allowing your back to  arch.  · You should feel a gentle stretch in the right / left calf. Hold this position for __________ seconds.  Repeat __________ times. Complete this stretch __________ times per day.  STRETCH  Soleus, Standing  · Place hands on wall.  · Extend right / left leg, keeping the other knee somewhat bent.  · Slightly point your toes inward on your back foot.  · Keep your right / left heel on the floor, bend your back knee, and slightly shift your weight over the back leg so that you feel a gentle stretch deep in your back calf.  · Hold this position for __________ seconds.  Repeat __________ times. Complete this stretch __________ times per day.  STRETCH  Gastrocsoleus, Standing   Note: This exercise can place a lot of stress on your foot and ankle. Please complete this exercise only if specifically instructed by your caregiver.   · Place the ball of your right / left foot on a step, keeping   your other foot firmly on the same step.  · Hold on to the wall or a rail for balance.  · Slowly lift your other foot, allowing your body weight to press your heel down over the edge of the step.  · You should feel a stretch in your right / left calf.  · Hold this position for __________ seconds.  · Repeat this exercise with a slight bend in your right / left knee.  Repeat __________ times. Complete this stretch __________ times per day.   STRENGTHENING EXERCISES - Plantar Fasciitis (Heel Spur Syndrome)   These exercises may help you when beginning to rehabilitate your injury. They may resolve your symptoms with or without further involvement from your physician, physical therapist or athletic trainer. While completing these exercises, remember:   · Muscles can gain both the endurance and the strength needed for everyday activities through controlled exercises.  · Complete these exercises as instructed by your physician, physical therapist or athletic trainer. Progress the resistance and repetitions only as guided.  STRENGTH - Towel  Curls  · Sit in a chair positioned on a non-carpeted surface.  · Place your foot on a towel, keeping your heel on the floor.  · Pull the towel toward your heel by only curling your toes. Keep your heel on the floor.  · If instructed by your physician, physical therapist or athletic trainer, add ____________________ at the end of the towel.  Repeat __________ times. Complete this exercise __________ times per day.  STRENGTH - Ankle Inversion  · Secure one end of a rubber exercise band/tubing to a fixed object (table, pole). Loop the other end around your foot just before your toes.  · Place your fists between your knees. This will focus your strengthening at your ankle.  · Slowly, pull your big toe up and in, making sure the band/tubing is positioned to resist the entire motion.  · Hold this position for __________ seconds.  · Have your muscles resist the band/tubing as it slowly pulls your foot back to the starting position.  Repeat __________ times. Complete this exercises __________ times per day.   Document Released: 02/03/2005 Document Revised: 04/28/2011 Document Reviewed: 05/18/2008  [employer]® Patient Information ©2014 [employer].

## 2013-01-03 ENCOUNTER — Encounter: Payer: Self-pay | Admitting: Podiatry

## 2013-01-07 ENCOUNTER — Encounter: Payer: Self-pay | Admitting: Podiatry

## 2013-01-07 ENCOUNTER — Ambulatory Visit (INDEPENDENT_AMBULATORY_CARE_PROVIDER_SITE_OTHER): Payer: BC Managed Care – PPO | Admitting: Podiatry

## 2013-01-07 VITALS — BP 120/72 | HR 85 | Resp 16 | Ht 72.0 in | Wt 225.0 lb

## 2013-01-07 DIAGNOSIS — M722 Plantar fascial fibromatosis: Secondary | ICD-10-CM

## 2013-01-07 NOTE — Progress Notes (Signed)
Subjective:     Patient ID: Keith Gross, male   DOB: 09-24-1955, 57 y.o.   MRN: [id number]  HPI patient states the pain is improved but still present left heel. Presents with old orthotics that have lost her ability to hold his arch appropriately   Review of Systems     Objective:   Physical Exam Neurovascular status intact with pain in the plantar heel left   at the insertion of the tendon into the calcaneus Assessment:     Plantar fasciitis left with inflammation at the insertion    Plan:     Improvement occurring and today I have recommended long-term orthotics to reduce stress against the heel. Scanned for custom orthotic device

## 2013-01-25 ENCOUNTER — Encounter: Payer: Self-pay | Admitting: Podiatry

## 2013-02-02 ENCOUNTER — Ambulatory Visit (INDEPENDENT_AMBULATORY_CARE_PROVIDER_SITE_OTHER): Payer: BC Managed Care – PPO | Admitting: Internal Medicine

## 2013-02-02 ENCOUNTER — Encounter: Payer: Self-pay | Admitting: Internal Medicine

## 2013-02-02 VITALS — BP 120/80 | HR 92 | Temp 98.4°F | Ht 72.0 in | Wt 229.8 lb

## 2013-02-02 DIAGNOSIS — D696 Thrombocytopenia, unspecified: Secondary | ICD-10-CM

## 2013-02-02 DIAGNOSIS — M79609 Pain in unspecified limb: Secondary | ICD-10-CM

## 2013-02-02 DIAGNOSIS — F419 Anxiety disorder, unspecified: Secondary | ICD-10-CM

## 2013-02-02 DIAGNOSIS — R945 Abnormal results of liver function studies: Secondary | ICD-10-CM

## 2013-02-02 DIAGNOSIS — R739 Hyperglycemia, unspecified: Secondary | ICD-10-CM

## 2013-02-02 DIAGNOSIS — E785 Hyperlipidemia, unspecified: Secondary | ICD-10-CM

## 2013-02-02 DIAGNOSIS — M109 Gout, unspecified: Secondary | ICD-10-CM

## 2013-02-02 DIAGNOSIS — K219 Gastro-esophageal reflux disease without esophagitis: Secondary | ICD-10-CM

## 2013-02-02 DIAGNOSIS — M79672 Pain in left foot: Secondary | ICD-10-CM

## 2013-02-02 DIAGNOSIS — K769 Liver disease, unspecified: Secondary | ICD-10-CM

## 2013-02-02 DIAGNOSIS — R454 Irritability and anger: Secondary | ICD-10-CM

## 2013-02-02 DIAGNOSIS — N200 Calculus of kidney: Secondary | ICD-10-CM

## 2013-02-02 DIAGNOSIS — R7309 Other abnormal glucose: Secondary | ICD-10-CM

## 2013-02-02 DIAGNOSIS — I10 Essential (primary) hypertension: Secondary | ICD-10-CM

## 2013-02-02 DIAGNOSIS — G4730 Sleep apnea, unspecified: Secondary | ICD-10-CM

## 2013-02-02 DIAGNOSIS — F411 Generalized anxiety disorder: Secondary | ICD-10-CM

## 2013-02-02 DIAGNOSIS — I2510 Atherosclerotic heart disease of native coronary artery without angina pectoris: Secondary | ICD-10-CM

## 2013-02-02 MED ORDER — SERTRALINE HCL 50 MG PO TABS: ORAL_TABLET | ORAL | Status: DC

## 2013-02-02 NOTE — Assessment & Plan Note (Signed)
Low carb diet.  Follow.  Check met b and a1c next labs.   

## 2013-02-02 NOTE — Assessment & Plan Note (Signed)
Low cholesterol diet and exercise.  Follow lipid profile.    

## 2013-02-02 NOTE — Assessment & Plan Note (Signed)
Blood pressure is doing well.  Same med regimen.  Follow.    

## 2013-02-02 NOTE — Assessment & Plan Note (Signed)
No recent flares.  On allopurinol.   

## 2013-02-02 NOTE — Progress Notes (Signed)
Subjective:    Patient ID: Keith Gross, male    DOB: [birth year], 56 y.o.   MRN: [id number]  HPI 57 year old male with past history of CAD with stent placed 5/13 (followed by Dr Mariah Milling), hypercholesterolemia, hypertension, nephrolithiasis, gout and sleep apnea.  He comes in today for a scheduled follow up.   Sees Dr Achilles Dunk.  He follows his PSA and follows him for kidney stones.  Was having some nocturia.  On flomax and doing well.  Saw Dr Achilles Dunk recently.  States PSA stable/ok.  Sleeping better.  Using CPAP.  He sees Dr Mariah Milling.   He is exercising.  Breathing stable.  States he has noticed some chest discomfort at times.  Has increased in frequency.  Takes sublingual NTG when it occurs.  One relieves.  Is taking the NTG either daily or every other day.  States Dr Mariah Milling gave him a rx for isosorbide at his previous visit.  He is not taking the medication.  Last episode of chest discomfort - a few days ago.   He has increased stress and anxiety.  A lot of stress with his job.  Increased anxiety.  Currently work is better now.  Was started on zoloft.  Dose increased last visit.   He does think this has helped.  Feels overall better. Feels the zoloft is working well.     Past Medical History  Diagnosis Date  . GERD (gastroesophageal reflux disease)   . Hypertension   . Anxiety   . Gout     Multiple episodes  . Dysplastic polyp of colon 2008    [facility]  . Treadmill stress test negative for angina pectoris 2011  . Syncopal episodes   . Hyperlipidemia   . Sleep apnea     on CPAP  . Angina   . Myocardial infarction 05/2011    "we think";  06/2011 abnl mv;  06/2011 Cath - subtotal occlusion of Ramus, otw nonobs dzs;  06/2011 PCI/DES of Ramus w/ 2.25x63mm Resolute DES   . Kidney stones   . Diastasis of muscle 2011    "abdominal"    Current Outpatient Prescriptions on File Prior to Visit  Medication Sig Dispense Refill  . allopurinol (ZYLOPRIM) 100 MG tablet Take 1 tablet (100 mg total) by mouth 2 (two)  times daily.  180 tablet  3  . ALPRAZolam (XANAX) 0.5 MG tablet 1/2 tablet by mouth twice a day      . amLODipine (NORVASC) 10 MG tablet Take 1 tablet (10 mg total) by mouth at bedtime.  90 tablet  3  . aspirin EC 81 MG tablet Take 81 mg by mouth daily.       Marland Kitchen atorvastatin (LIPITOR) 20 MG tablet TAKE 1 TABLET (20 MG TOTAL) BY MOUTH DAILY.  90 tablet  3  . CINNAMON PO Take 1,000 mg by mouth 2 (two) times daily.       . clopidogrel (PLAVIX) 75 MG tablet Take 1 tablet (75 mg total) by mouth daily.  90 tablet  3  . docusate sodium (COLACE) 100 MG capsule Take 100 mg by mouth 2 (two) times daily.       . fexofenadine (ALLEGRA) 180 MG tablet Take 180 mg by mouth daily.       Marland Kitchen FIBER PO Take 2 tablets by mouth daily.      . fluticasone (FLONASE) 50 MCG/ACT nasal spray Place 2 sprays into the nose daily.  48 g  3  . hydrochlorothiazide (HYDRODIURIL) 25 MG  tablet Take 1 tablet (25 mg total) by mouth at bedtime.  90 tablet  3  . Hydrocodone-Chlorpheniramine 5-4 MG/5ML SOLN Take by mouth as needed.       . magnesium oxide (MAG-OX) 400 MG tablet Take 400 mg by mouth daily.      . metoprolol tartrate (LOPRESSOR) 25 MG tablet Take 1/2 tablet daily.  45 tablet  3  . nitroGLYCERIN (NITROSTAT) 0.4 MG SL tablet Place 1 tablet (0.4 mg total) under the tongue every 5 (five) minutes as needed for chest pain.  25 tablet  3  . pantoprazole (PROTONIX) 40 MG tablet Take 1 tablet (40 mg total) by mouth daily.  90 tablet  3  . potassium citrate (UROCIT-K) 10 MEQ (1080 MG) SR tablet Take 10 mEq by mouth 2 (two) times daily.       . tamsulosin (FLOMAX) 0.4 MG CAPS Take 0.4 mg by mouth daily.      . Testosterone (ANDROGEL PUMP) 20.25 MG/ACT (1.62%) GEL Place 1 application onto the skin daily.      . valsartan-hydrochlorothiazide (DIOVAN-HCT) 320-25 MG per tablet Take 1 tablet by mouth daily.  90 tablet  3  . VITAMIN D, CHOLECALCIFEROL, PO Take 2,000 mg by mouth daily.      . vitamin E 1000 UNIT capsule Take 1,000 Units by  mouth daily.       No current facility-administered medications on file prior to visit.    Review of Systems Patient denies any headache, lightheadedness or dizziness.  No significant sinus or allergy symptoms currently. Last week had some allergy issues.  Took sudafed and Tussin DM.  Better now.  Discussed with him regarding not taking sudafed.   No palpitations.  Chest pain as outlined.  No increased shortness of breath, cough or congestion.  Breathing is stable.  Using CPAP regularly.  No nausea or vomiting.  Acid reflux controlled.  No abdominal pain or cramping.  No bowel change, such as diarrhea, constipation, BRBPR or melana.  On flomax and doing well.  Blood pressure has been doing well.  Increased stress as outlined.  Feels better on the zoloft.        Objective:   Physical Exam  Filed Vitals:   02/02/13 1205  BP: 120/80  Pulse: 92  Temp: 98.4 F (36.9 C)   Blood pressure recheck:  120/78, pulse 58  57 year old male in no acute distress.  HEENT:  Nares - clear.  Oropharynx - without lesions. NECK:  Supple.  Nontender.  No audible carotid bruit.  HEART:  Appears to be regular.   LUNGS:  No crackles or wheezing audible.  Respirations even and unlabored.   RADIAL PULSE:  Equal bilaterally.  ABDOMEN:  Soft.  Nontender.  Bowel sounds present and normal.  No audible abdominal bruit.    EXTREMITIES:  No increased edema present.  DP pulses palpable and equal bilaterally.     SKIN: no rash.        Assessment & Plan:  SHOULDER PAIN.  S/p injection.  Doing well.  Follow.   HEALTH MAINTENANCE.  Gets his prostate checks and PSA through urology.  Colonoscopy - Dr Devona Konig.  Overdue.  Will pursue after he is able to come off plavix.

## 2013-02-02 NOTE — Assessment & Plan Note (Signed)
Better on zoloft.  Follow.

## 2013-02-02 NOTE — Assessment & Plan Note (Signed)
Doing better on zoloft.  Better on 50mg  1 1/2 tablet q day.  Follow.  Hold on adjusting the dose at this time.  Follow.

## 2013-02-02 NOTE — Assessment & Plan Note (Signed)
Utilizing CPAP. °

## 2013-02-02 NOTE — Progress Notes (Signed)
Pre-visit discussion using our clinic review tool. No additional management support is needed unless otherwise documented below in the visit note.  

## 2013-02-02 NOTE — Assessment & Plan Note (Signed)
Followed by Dr Cope.  Currently doing well.  

## 2013-02-02 NOTE — Assessment & Plan Note (Signed)
Recent abdominal ultrasound revealed a mildly enlarged spleen.  Will follow.  Follow platelet count.  Last check improved - 140.

## 2013-02-02 NOTE — Assessment & Plan Note (Signed)
Liver panel 12/15/12 - wnl.

## 2013-02-02 NOTE — Assessment & Plan Note (Signed)
Sees Dr Charlsie Merles.

## 2013-02-02 NOTE — Assessment & Plan Note (Signed)
Has known disease s/p stent placement.  Stent placed in 5/13.  Sees Dr Mariah Milling.  Describes the chest pain as outlined.  Relieved with one SL NTG.  Occurring daily or every other day.  Last episode a couple of days ago.  Discussed further w/up.  He declines EKG.  Desired to monitor.  After discussion, he agreed for me to notify Dr Mariah Milling and then further w/up pending his response.  Has not started the isosorbide.  Follow.  He knows needs evaluation if pain persists.

## 2013-02-02 NOTE — Assessment & Plan Note (Signed)
Controlled on current regimen.  Follow.   Off antiinflammatories.

## 2013-02-07 ENCOUNTER — Telehealth: Payer: Self-pay | Admitting: Internal Medicine

## 2013-02-07 DIAGNOSIS — I2510 Atherosclerotic heart disease of native coronary artery without angina pectoris: Secondary | ICD-10-CM

## 2013-02-07 DIAGNOSIS — R079 Chest pain, unspecified: Secondary | ICD-10-CM

## 2013-02-07 NOTE — Telephone Encounter (Signed)
Pt has my chart, but please call pt and notify him that I did hear back from Dr Mariah Milling and he does want to see him - to determine next best test (given his increase in chest pain).  If he is agreeable, let me know and I will place the order for the referral for an appt fo - asap.     Thanks.

## 2013-02-08 ENCOUNTER — Encounter: Payer: Self-pay | Admitting: *Deleted

## 2013-02-08 NOTE — Telephone Encounter (Signed)
Pt would to proceed with referral

## 2013-02-08 NOTE — Telephone Encounter (Signed)
LMTCB

## 2013-02-08 NOTE — Telephone Encounter (Signed)
Tried to call pt back-left message to inform them to check mychart (response needed also)

## 2013-02-08 NOTE — Telephone Encounter (Signed)
Referral made to cardiology

## 2013-02-09 ENCOUNTER — Encounter: Payer: Self-pay | Admitting: Emergency Medicine

## 2013-02-11 NOTE — Telephone Encounter (Signed)
See telephone note, pt was contacted by phone

## 2013-02-18 ENCOUNTER — Encounter: Payer: Self-pay | Admitting: Cardiovascular Disease

## 2013-02-18 ENCOUNTER — Ambulatory Visit (INDEPENDENT_AMBULATORY_CARE_PROVIDER_SITE_OTHER): Payer: BC Managed Care – PPO | Admitting: Cardiovascular Disease

## 2013-02-18 VITALS — BP 120/94 | HR 82 | Ht 72.0 in | Wt 229.5 lb

## 2013-02-18 DIAGNOSIS — E785 Hyperlipidemia, unspecified: Secondary | ICD-10-CM

## 2013-02-18 DIAGNOSIS — R079 Chest pain, unspecified: Secondary | ICD-10-CM

## 2013-02-18 DIAGNOSIS — I10 Essential (primary) hypertension: Secondary | ICD-10-CM

## 2013-02-18 DIAGNOSIS — I2510 Atherosclerotic heart disease of native coronary artery without angina pectoris: Secondary | ICD-10-CM

## 2013-02-18 DIAGNOSIS — I200 Unstable angina: Secondary | ICD-10-CM

## 2013-02-18 DIAGNOSIS — G4730 Sleep apnea, unspecified: Secondary | ICD-10-CM

## 2013-02-18 DIAGNOSIS — F411 Generalized anxiety disorder: Secondary | ICD-10-CM

## 2013-02-18 DIAGNOSIS — F419 Anxiety disorder, unspecified: Secondary | ICD-10-CM

## 2013-02-18 DIAGNOSIS — R454 Irritability and anger: Secondary | ICD-10-CM

## 2013-02-18 NOTE — Assessment & Plan Note (Signed)
Cholesterol is at goal on the current lipid regimen. No changes to the medications were made.  

## 2013-02-18 NOTE — Assessment & Plan Note (Signed)
He feels that his irritability and outbursts with clients has been worse since changing to Zoloft, better on Lexapro. Suggested he talk with Dr. Nicki Reaper about possibly changing medications.

## 2013-02-18 NOTE — Assessment & Plan Note (Signed)
Uncertain if anxiety is playing a role in his chest pain symptoms at rest. Again surprising he is having no symptoms with exertion. We'll follow him closely

## 2013-02-18 NOTE — Assessment & Plan Note (Signed)
Continues to have chest pain at rest. Concerning for small vessel disease. Still with no angina with exertion. He reports symptoms worse since starting Zoloft, better on Lexapro. We did discuss doing a catheterization, stress test or medical management. After much discussion, he will start ranexa 500 mg by mouth twice a day for one week then up to 1000 mg by mouth twice a day. We will touch base in several days' time before he leaves town on a trip. Plan is for possible stress test next week or the week after depending if symptoms improve on the ranexa. Is not particularly interested in catheterization at this time. Stress test would show old inferior scar from previous damage as seen on prior stress test.

## 2013-02-18 NOTE — Assessment & Plan Note (Signed)
Compliant with his CPAP. 

## 2013-02-18 NOTE — Progress Notes (Signed)
Patient ID: Keith Gross, male    DOB: 03-Aug-1955, 58 y.o.   MRN: [id number]  HPI Comments: Keith Gross is a very pleasant 58 year old gentleman, patient of Dr. Einar Pheasant and Edrick Oh, remote history of syncope in 2010 felt secondary to low blood pressure that improved with medication changes,  with chest pain symptoms at the beginning of  April 2013, stress test showing inferior lateral hypoperfusion consistent with large region of scar which was new from 2010, cardiac catheterization showing severe ramus disease, with DES stent placed in May 2013. Followup catheterization September 2013 for chest pain showing patent stent, distal left circumflex disease noted, moderate in nature. Medical management recommended.  In followup today as on previous visits, he is active, works out several days per week. He continues to have chest pain at rest almost on a daily basis, not typically with exertion. Recently changed from Lexapro to Zoloft and wife reports that he has had personality change "not in a good way".  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  He is uncertain if the Zoloft has caused him to have worsening chest discomfort as he has been more agitated, aggressive. Medicare normal. His over and he is more laxed but continues to have symptoms.  Also recently developed swelling over his left tibia midway up the leg, appeared spontaneously without any trauma. Swelling has subsided but still mild, feels firm. He try isosorbide for his chest pain and reports having severe reaction with significant headache and pounding in his head  Is wearing his CPAP for structures sleep apnea He does have a previous history of smoking, quit in 1992. Father had his first heart attack at age 102.  EKG shows normal sinus rhythm with rate 82 beats per minute with old inferior MI, similar to EKG in June 2013  Outpatient Encounter Prescriptions as of 02/18/2013   Medication Sig  . allopurinol (ZYLOPRIM) 100 MG tablet Take 1 tablet (100 mg total) by mouth 2 (two) times daily.  Marland Kitchen ALPRAZolam (XANAX) 0.5 MG tablet 1/2 tablet by mouth twice a day  . amLODipine (NORVASC) 10 MG tablet Take 1 tablet (10 mg total) by mouth at bedtime.  Marland Kitchen aspirin EC 81 MG tablet Take 81 mg by mouth daily.   Marland Kitchen atorvastatin (LIPITOR) 20 MG tablet TAKE 1 TABLET (20 MG TOTAL) BY MOUTH DAILY.  Marland Kitchen CINNAMON PO Take 1,000 mg by mouth 2 (two) times daily.   . clopidogrel (PLAVIX) 75 MG tablet Take 1 tablet (75 mg total) by mouth daily.  Marland Kitchen docusate sodium (COLACE) 100 MG capsule Take 100 mg by mouth 2 (two) times daily.   . fexofenadine (ALLEGRA) 180 MG tablet Take 180 mg by mouth daily.   Marland Kitchen FIBER PO Take 2 tablets by mouth daily.  . fluticasone (FLONASE) 50 MCG/ACT nasal spray Place 2 sprays into the nose daily.  . hydrochlorothiazide (HYDRODIURIL) 25 MG tablet Take 1 tablet (25 mg total) by mouth at bedtime.  Marland Kitchen Hydrocodone-Chlorpheniramine 5-4 MG/5ML SOLN Take by mouth as needed.   . magnesium oxide (MAG-OX) 400 MG tablet Take 400 mg by mouth daily.  . metoprolol tartrate (LOPRESSOR) 25 MG tablet Take 1/2 tablet daily.  . nitroGLYCERIN (NITROSTAT) 0.4 MG SL tablet Place 1 tablet (0.4 mg total) under the tongue every 5 (five) minutes as needed for chest pain.  . pantoprazole (PROTONIX) 40 MG tablet Take 1 tablet (40 mg total) by mouth daily.  . potassium citrate (UROCIT-K)  10 MEQ (1080 MG) SR tablet Take 10 mEq by mouth 2 (two) times daily.   . sertraline (ZOLOFT) 50 MG tablet Take 1 1/2 tablet q day  . tamsulosin (FLOMAX) 0.4 MG CAPS Take 0.4 mg by mouth daily.  . Testosterone (ANDROGEL PUMP) 20.25 MG/ACT (1.62%) GEL Place 1 application onto the skin daily.  . valsartan-hydrochlorothiazide (DIOVAN-HCT) 320-25 MG per tablet Take 1 tablet by mouth daily.  Marland Kitchen VITAMIN D, CHOLECALCIFEROL, PO Take 2,000 mg by mouth daily.  . vitamin E 1000 UNIT capsule Take 1,000 Units by mouth daily.     Review of Systems  Constitutional: Negative.   HENT: Negative.   Eyes: Negative.   Respiratory: Positive for chest tightness.   Cardiovascular: Positive for chest pain.  Gastrointestinal: Negative.   Endocrine: Negative.   Musculoskeletal: Negative.   Skin: Negative.   Allergic/Immunologic: Negative.   Neurological: Negative.   Hematological: Negative.   Psychiatric/Behavioral: The patient is nervous/anxious.   All other systems reviewed and are negative.    BP 120/94  Pulse 82  Ht 6' (1.829 m)  Wt 229 lb 8 oz (104.101 kg)  BMI 31.12 kg/m2  Physical Exam  Nursing note and vitals reviewed. Constitutional: He is oriented to person, place, and time. He appears well-developed and well-nourished.  HENT:  Head: Normocephalic.  Nose: Nose normal.  Mouth/Throat: Oropharynx is clear and moist.  Eyes: Conjunctivae are normal. Pupils are equal, round, and reactive to light.  Neck: Normal range of motion. Neck supple. No JVD present.  Cardiovascular: Normal rate, regular rhythm, S1 normal, S2 normal, normal heart sounds and intact distal pulses.  Exam reveals no gallop and no friction rub.   No murmur heard. Pulmonary/Chest: Effort normal and breath sounds normal. No respiratory distress. He has no wheezes. He has no rales. He exhibits no tenderness.  Abdominal: Soft. Bowel sounds are normal. He exhibits no distension. There is no tenderness.  Musculoskeletal: Normal range of motion. He exhibits no edema and no tenderness.  Lymphadenopathy:    He has no cervical adenopathy.  Neurological: He is alert and oriented to person, place, and time. Coordination normal.  Skin: Skin is warm and dry. No rash noted. No erythema.  Psychiatric: He has a normal mood and affect. His behavior is normal. Judgment and thought content normal.      Assessment and Plan

## 2013-02-18 NOTE — Assessment & Plan Note (Signed)
As mentioned above, chronic chest pain at rest, typically not with exertion. Catheterization, stress testing offered. We'll discuss stress testing timing with him next week

## 2013-02-18 NOTE — Patient Instructions (Addendum)
You are doing well. Please start ranexa 500 mg twice a day for one week Then increase up to 1000 mg twice a day  Please call the office next week We can schedule a stress test in the next week or so if needed for chest pain  Please call us if you have new issues that need to be addressed before your next appt.  Your physician wants you to follow-up in: 6 months.  You will receive a reminder letter in the mail two months in advance. If you don't receive a letter, please call our office to schedule the follow-up appointment.

## 2013-02-18 NOTE — Assessment & Plan Note (Signed)
Blood pressure is well controlled on today's visit. No changes made to the medications. 

## 2013-02-21 ENCOUNTER — Encounter: Payer: Self-pay | Admitting: Podiatry

## 2013-02-24 ENCOUNTER — Encounter: Payer: Self-pay | Admitting: Cardiovascular Disease

## 2013-02-25 NOTE — Telephone Encounter (Signed)
See other mychart message response.  

## 2013-03-15 ENCOUNTER — Encounter: Payer: Self-pay | Admitting: Internal Medicine

## 2013-03-16 ENCOUNTER — Telehealth: Payer: Self-pay | Admitting: Internal Medicine

## 2013-03-16 DIAGNOSIS — F419 Anxiety disorder, unspecified: Secondary | ICD-10-CM

## 2013-03-16 NOTE — Telephone Encounter (Signed)
I thought I had sent this back to you with a response, but looks like I did not.  Go ahead and make the appt, but please let them know to contact him if they have a cancellation or can work him in any earlier.  Thanks.

## 2013-03-16 NOTE — Telephone Encounter (Signed)
Replied to pt.  See other my chart message.

## 2013-03-16 NOTE — Telephone Encounter (Signed)
This pt called and has tapered his anxiety meds.  If off now and needing to start a new medication.  He is now agreeable for a psychiatry referral.  Is there any way to get him in soon, so that they can assess him off medication and determine next best medication (prior to [place] starting another medication).  Let me know if a problem or if I need to talk to someone).  Can you see if Dr Nicolasa Ducking could work him in soon.  Thanks.

## 2013-03-16 NOTE — Telephone Encounter (Signed)
We are looking at mid to late March for apts with Nicolasa Ducking. Is this ok?

## 2013-03-21 ENCOUNTER — Encounter: Payer: Self-pay | Admitting: Cardiovascular Disease

## 2013-03-21 ENCOUNTER — Other Ambulatory Visit: Payer: Self-pay

## 2013-03-21 ENCOUNTER — Encounter: Payer: Self-pay | Admitting: Emergency Medicine

## 2013-03-21 DIAGNOSIS — R0602 Shortness of breath: Secondary | ICD-10-CM

## 2013-03-21 DIAGNOSIS — R079 Chest pain, unspecified: Secondary | ICD-10-CM

## 2013-03-21 MED ORDER — PAROXETINE HCL 20 MG PO TABS: 20.0000 mg | ORAL_TABLET | Freq: Every day | ORAL | Status: DC

## 2013-03-21 NOTE — Telephone Encounter (Signed)
Prescription sent in for paxil 20mg  q day.  Follow.

## 2013-03-29 ENCOUNTER — Ambulatory Visit: Payer: Self-pay | Admitting: Cardiovascular Disease

## 2013-03-29 ENCOUNTER — Telehealth: Payer: Self-pay

## 2013-03-29 ENCOUNTER — Ambulatory Visit: Payer: BC Managed Care – PPO | Admitting: Cardiovascular Disease

## 2013-03-29 DIAGNOSIS — R079 Chest pain, unspecified: Secondary | ICD-10-CM

## 2013-03-29 NOTE — Telephone Encounter (Signed)
Spoke w/ pt.  Advised him that his stress test results were not ready yet and they he will hear from our office as soon as Dr. Rockey Situ reads and interprets them. He is agreeable to this and would like a phone call tomorrow if possible.

## 2013-03-29 NOTE — Telephone Encounter (Signed)
Pt wife called and asks if Dr Rockey Situ has pt results from stress test this morning. Please call. States pt is very anxious.

## 2013-03-30 ENCOUNTER — Other Ambulatory Visit: Payer: Self-pay

## 2013-03-30 DIAGNOSIS — R0602 Shortness of breath: Secondary | ICD-10-CM

## 2013-03-30 DIAGNOSIS — R079 Chest pain, unspecified: Secondary | ICD-10-CM

## 2013-03-31 ENCOUNTER — Other Ambulatory Visit (INDEPENDENT_AMBULATORY_CARE_PROVIDER_SITE_OTHER): Payer: BC Managed Care – PPO

## 2013-03-31 DIAGNOSIS — R7309 Other abnormal glucose: Secondary | ICD-10-CM

## 2013-03-31 DIAGNOSIS — E785 Hyperlipidemia, unspecified: Secondary | ICD-10-CM

## 2013-03-31 DIAGNOSIS — R739 Hyperglycemia, unspecified: Secondary | ICD-10-CM

## 2013-03-31 DIAGNOSIS — D696 Thrombocytopenia, unspecified: Secondary | ICD-10-CM

## 2013-03-31 DIAGNOSIS — I10 Essential (primary) hypertension: Secondary | ICD-10-CM

## 2013-03-31 LAB — CBC WITH DIFFERENTIAL/PLATELET
BASOS PCT: 0.5 % (ref 0.0–3.0)
Basophils Absolute: 0 10*3/uL (ref 0.0–0.1)
EOS PCT: 1.3 % (ref 0.0–5.0)
Eosinophils Absolute: 0.1 10*3/uL (ref 0.0–0.7)
HEMATOCRIT: 45.8 % (ref 39.0–52.0)
HEMOGLOBIN: 15.4 g/dL (ref 13.0–17.0)
LYMPHS PCT: 23.4 % (ref 12.0–46.0)
Lymphs Abs: 1.5 10*3/uL (ref 0.7–4.0)
MCHC: 33.7 g/dL (ref 30.0–36.0)
MCV: 93.8 fl (ref 78.0–100.0)
MONOS PCT: 8 % (ref 3.0–12.0)
Monocytes Absolute: 0.5 10*3/uL (ref 0.1–1.0)
NEUTROS PCT: 66.8 % (ref 43.0–77.0)
Neutro Abs: 4.3 10*3/uL (ref 1.4–7.7)
Platelets: 148 10*3/uL — ABNORMAL LOW (ref 150.0–400.0)
RBC: 4.88 Mil/uL (ref 4.22–5.81)
RDW: 13.3 % (ref 11.5–14.6)
WBC: 6.4 10*3/uL (ref 4.5–10.5)

## 2013-03-31 LAB — LIPID PANEL
CHOL/HDL RATIO: 4
Cholesterol: 131 mg/dL (ref 0–200)
HDL: 30 mg/dL — ABNORMAL LOW (ref >39.00)
LDL CALC: 61 mg/dL (ref 0–99)
Triglycerides: 200 mg/dL — ABNORMAL HIGH (ref 0.0–149.0)
VLDL: 40 mg/dL (ref 0.0–40.0)

## 2013-03-31 LAB — BASIC METABOLIC PANEL
BUN: 15 mg/dL (ref 6–23)
CALCIUM: 9 mg/dL (ref 8.4–10.5)
CO2: 31 meq/L (ref 19–32)
CREATININE: 1.1 mg/dL (ref 0.4–1.5)
Chloride: 101 mEq/L (ref 96–112)
GFR: 76.63 mL/min (ref >60.00)
GLUCOSE: 206 mg/dL — AB (ref 70–99)
Potassium: 3.8 mEq/L (ref 3.5–5.1)
Sodium: 138 mEq/L (ref 135–145)

## 2013-03-31 LAB — HEPATIC FUNCTION PANEL
ALT: 26 U/L (ref 0–53)
AST: 23 U/L (ref 0–37)
Albumin: 4.1 g/dL (ref 3.5–5.2)
Alkaline Phosphatase: 37 U/L — ABNORMAL LOW (ref 39–117)
BILIRUBIN DIRECT: 0.1 mg/dL (ref 0.0–0.3)
BILIRUBIN TOTAL: 0.8 mg/dL (ref 0.3–1.2)
Total Protein: 6.8 g/dL (ref 6.0–8.3)

## 2013-03-31 LAB — HEMOGLOBIN A1C: HEMOGLOBIN A1C: 6.3 % (ref 4.6–6.5)

## 2013-04-05 ENCOUNTER — Ambulatory Visit: Payer: BC Managed Care – PPO | Admitting: Internal Medicine

## 2013-04-05 ENCOUNTER — Encounter: Payer: Self-pay | Admitting: Internal Medicine

## 2013-04-08 NOTE — Telephone Encounter (Signed)
Mailed unread message to pt  

## 2013-06-14 ENCOUNTER — Encounter: Payer: Self-pay | Admitting: Internal Medicine

## 2013-06-14 ENCOUNTER — Encounter: Payer: Self-pay | Admitting: *Deleted

## 2013-06-14 MED ORDER — FEXOFENADINE HCL 180 MG PO TABS: 180.0000 mg | ORAL_TABLET | Freq: Every day | ORAL | Status: DC

## 2013-06-14 NOTE — Telephone Encounter (Signed)
Refill sent to pharmacy and patient sent Mychart message informing them.

## 2013-06-29 ENCOUNTER — Other Ambulatory Visit: Payer: Self-pay | Admitting: Cardiovascular Disease

## 2013-06-29 NOTE — Telephone Encounter (Signed)
Requested Prescriptions   Signed Prescriptions Disp Refills  . valsartan-hydrochlorothiazide (DIOVAN-HCT) 320-25 MG per tablet 90 tablet 3    Sig: TAKE 1 TABLET BY MOUTH EVERY DAY    Authorizing Provider: Minna Merritts    Ordering User: LOPEZ, MARINA C  . amLODipine (NORVASC) 10 MG tablet 90 tablet 3    Sig: TAKE 1 TABLET BY MOUTH EVERY DAY    Authorizing Provider: Minna Merritts    Ordering User: Britt Bottom

## 2013-06-30 ENCOUNTER — Other Ambulatory Visit: Payer: Self-pay | Admitting: *Deleted

## 2013-06-30 MED ORDER — PANTOPRAZOLE SODIUM 40 MG PO TBEC: 40.0000 mg | DELAYED_RELEASE_TABLET | Freq: Every day | ORAL | Status: DC

## 2013-07-18 ENCOUNTER — Other Ambulatory Visit: Payer: Self-pay | Admitting: Cardiovascular Disease

## 2013-07-29 ENCOUNTER — Encounter: Payer: Self-pay | Admitting: Internal Medicine

## 2013-07-29 ENCOUNTER — Ambulatory Visit (INDEPENDENT_AMBULATORY_CARE_PROVIDER_SITE_OTHER): Payer: BC Managed Care – PPO | Admitting: Internal Medicine

## 2013-07-29 ENCOUNTER — Ambulatory Visit: Payer: BC Managed Care – PPO | Admitting: Internal Medicine

## 2013-07-29 VITALS — BP 120/70 | HR 80 | Temp 98.2°F | Ht 72.0 in | Wt 231.8 lb

## 2013-07-29 DIAGNOSIS — R945 Abnormal results of liver function studies: Secondary | ICD-10-CM

## 2013-07-29 DIAGNOSIS — F411 Generalized anxiety disorder: Secondary | ICD-10-CM

## 2013-07-29 DIAGNOSIS — K769 Liver disease, unspecified: Secondary | ICD-10-CM

## 2013-07-29 DIAGNOSIS — F419 Anxiety disorder, unspecified: Secondary | ICD-10-CM

## 2013-07-29 DIAGNOSIS — R0789 Other chest pain: Secondary | ICD-10-CM

## 2013-07-29 DIAGNOSIS — R454 Irritability and anger: Secondary | ICD-10-CM

## 2013-07-29 DIAGNOSIS — D696 Thrombocytopenia, unspecified: Secondary | ICD-10-CM

## 2013-07-29 DIAGNOSIS — R238 Other skin changes: Secondary | ICD-10-CM

## 2013-07-29 DIAGNOSIS — R233 Spontaneous ecchymoses: Secondary | ICD-10-CM

## 2013-07-29 DIAGNOSIS — I2510 Atherosclerotic heart disease of native coronary artery without angina pectoris: Secondary | ICD-10-CM

## 2013-07-29 DIAGNOSIS — E785 Hyperlipidemia, unspecified: Secondary | ICD-10-CM

## 2013-07-29 DIAGNOSIS — K219 Gastro-esophageal reflux disease without esophagitis: Secondary | ICD-10-CM

## 2013-07-29 DIAGNOSIS — I10 Essential (primary) hypertension: Secondary | ICD-10-CM

## 2013-07-29 DIAGNOSIS — R739 Hyperglycemia, unspecified: Secondary | ICD-10-CM

## 2013-07-29 DIAGNOSIS — R7309 Other abnormal glucose: Secondary | ICD-10-CM

## 2013-07-29 DIAGNOSIS — G4730 Sleep apnea, unspecified: Secondary | ICD-10-CM

## 2013-07-29 NOTE — Progress Notes (Signed)
Pre visit review using our clinic review tool, if applicable. No additional management support is needed unless otherwise documented below in the visit note. 

## 2013-08-01 ENCOUNTER — Other Ambulatory Visit: Payer: Self-pay | Admitting: Internal Medicine

## 2013-08-02 ENCOUNTER — Encounter: Payer: Self-pay | Admitting: Internal Medicine

## 2013-08-02 DIAGNOSIS — R233 Spontaneous ecchymoses: Secondary | ICD-10-CM | POA: Insufficient documentation

## 2013-08-02 DIAGNOSIS — R238 Other skin changes: Secondary | ICD-10-CM | POA: Insufficient documentation

## 2013-08-02 NOTE — Assessment & Plan Note (Signed)
Better on current regimen.

## 2013-08-02 NOTE — Assessment & Plan Note (Signed)
On aspirin and plavix.  Check cbc.

## 2013-08-02 NOTE — Assessment & Plan Note (Signed)
Blood pressure is doing well.  Same med regimen.  Follow.

## 2013-08-02 NOTE — Assessment & Plan Note (Signed)
Low carb diet.  Follow.  Check met b and a1c next labs.

## 2013-08-02 NOTE — Assessment & Plan Note (Signed)
Not an issue now.  Follow.    

## 2013-08-02 NOTE — Assessment & Plan Note (Signed)
Doing better on his current regimen.  Sleeping better.  Continue f/u with Dr Nicolasa Ducking.

## 2013-08-02 NOTE — Assessment & Plan Note (Signed)
Low cholesterol diet and exercise.  Follow lipid profile.

## 2013-08-02 NOTE — Assessment & Plan Note (Signed)
Controlled on current regimen.  Follow.   Off antiinflammatories.  Being referred for his colonoscopy.  Given his continued need for PPI and given that he has never had EGD, will refer for evaluation for EGD as well.

## 2013-08-02 NOTE — Assessment & Plan Note (Signed)
Has known disease s/p stent placement.  Stent placed in 5/13.  Sees Dr Rockey Situ.  Currently without symptoms.  Continue risk factor modification.

## 2013-08-02 NOTE — Assessment & Plan Note (Signed)
Recheck liver panel.  

## 2013-08-02 NOTE — Assessment & Plan Note (Signed)
Utilizing CPAP. °

## 2013-08-02 NOTE — Progress Notes (Signed)
Subjective:    Patient ID: Keith Haroon., male    DOB: Mar 04, 1955, 58 y.o.   MRN: [id number]  HPI 58 year old male with past history of CAD with stent placed 5/13 (followed by Dr Rockey Situ), hypercholesterolemia, hypertension, nephrolithiasis, gout and sleep apnea.  He comes in today for a scheduled follow up.   Sees Dr Jacqlyn Larsen.  He follows his PSA and follows him for kidney stones.  On flomax and doing well.  Seeing Dr Nicolasa Ducking.  States was sleep deprived.  On Lamictal, effexor and trazodone.  Sleeping better. Feels better.  Getting back to work.   Using CPAP.  He sees Dr Rockey Situ.   He is exercising.  Breathing stable.   No chest pain or tightness recently.  States resolved with stopping zoloft.  He has increased stress and anxiety.  A lot of stress with his job.  Increased anxiety.  Doing better.  Gradually easing back in to work.   Has noticed some increased bruising.  Is on plavix and aspirin.  Due to f/u with cardiology 08/24/13.  Feels from a cardiac standpoint - stable.  Due colonoscopy.  States Dr Rockey Situ told him ok to proceed from a cardiac standpoint.  Is on protonix.  Needs.  Has never had EGD.     Past Medical History  Diagnosis Date  . GERD (gastroesophageal reflux disease)   . Hypertension   . Anxiety   . Gout     Multiple episodes  . Dysplastic polyp of colon 2008    [facility]  . Treadmill stress test negative for angina pectoris 2011  . Syncopal episodes   . Hyperlipidemia   . Sleep apnea     on CPAP  . Angina   . Myocardial infarction 05/2011    "we think";  06/2011 abnl mv;  06/2011 Cath - subtotal occlusion of Ramus, otw nonobs dzs;  06/2011 PCI/DES of Ramus w/ 2.25x67mm Resolute DES   . Kidney stones   . Diastasis of muscle 2011    "abdominal"    Current Outpatient Prescriptions on File Prior to Visit  Medication Sig Dispense Refill  . allopurinol (ZYLOPRIM) 100 MG tablet Take 1 tablet (100 mg total) by mouth 2 (two) times daily.  180 tablet  3  . amLODipine (NORVASC) 10 MG tablet  TAKE 1 TABLET BY MOUTH EVERY DAY  90 tablet  3  . aspirin EC 81 MG tablet Take 81 mg by mouth daily.       Marland Kitchen atorvastatin (LIPITOR) 20 MG tablet TAKE ONE TABLET EVERY DAY  90 tablet  3  . CINNAMON PO Take 1,000 mg by mouth 2 (two) times daily.       . clopidogrel (PLAVIX) 75 MG tablet Take 1 tablet (75 mg total) by mouth daily.  90 tablet  3  . docusate sodium (COLACE) 100 MG capsule Take 100 mg by mouth 2 (two) times daily.       . fexofenadine (ALLEGRA) 180 MG tablet Take 1 tablet (180 mg total) by mouth daily.  30 tablet  2  . FIBER PO Take 2 tablets by mouth daily.      . fluticasone (FLONASE) 50 MCG/ACT nasal spray Place 2 sprays into the nose daily.  48 g  3  . hydrochlorothiazide (HYDRODIURIL) 25 MG tablet Take 1 tablet (25 mg total) by mouth at bedtime.  90 tablet  3  . Hydrocodone-Chlorpheniramine 5-4 MG/5ML SOLN Take by mouth as needed.       . magnesium oxide (  MAG-OX) 400 MG tablet Take 400 mg by mouth daily.      . metoprolol tartrate (LOPRESSOR) 25 MG tablet Take 1/2 tablet daily.  45 tablet  3  . nitroGLYCERIN (NITROSTAT) 0.4 MG SL tablet Place 1 tablet (0.4 mg total) under the tongue every 5 (five) minutes as needed for chest pain.  25 tablet  3  . potassium citrate (UROCIT-K) 10 MEQ (1080 MG) SR tablet Take 10 mEq by mouth 2 (two) times daily.       . tamsulosin (FLOMAX) 0.4 MG CAPS Take 0.4 mg by mouth daily.      . Testosterone (ANDROGEL PUMP) 20.25 MG/ACT (1.62%) GEL Place 1 application onto the skin daily.      . valsartan-hydrochlorothiazide (DIOVAN-HCT) 320-25 MG per tablet TAKE 1 TABLET BY MOUTH EVERY DAY  90 tablet  3  . VITAMIN D, CHOLECALCIFEROL, PO Take 2,000 mg by mouth daily.      . vitamin E 1000 UNIT capsule Take 1,000 Units by mouth daily.       No current facility-administered medications on file prior to visit.    Review of Systems Patient denies any headache, lightheadedness or dizziness.  No significant sinus or allergy symptoms currently.  No  palpitations.  No further chest pain or tightness.  No increased shortness of breath, cough or congestion.  Breathing is stable.  Using CPAP regularly.  No nausea or vomiting.  Acid reflux controlled if takes his medication.  Needs the medication to control.   No abdominal pain or cramping.  No bowel change, such as diarrhea, constipation, BRBPR or melana.  On flomax and doing well.  Blood pressure has been doing well.  Increased stress as outlined.  Feels better on his current regimen.  Sleeping better.        Objective:   Physical Exam  Filed Vitals:   07/29/13 1210  BP: 120/70  Pulse: 80  Temp: 98.2 F (36.8 C)   Blood pressure recheck:  120/72, pulse 30  58 year old male in no acute distress.  HEENT:  Nares - clear.  Oropharynx - without lesions. NECK:  Supple.  Nontender.  No audible carotid bruit.  HEART:  Appears to be regular.   LUNGS:  No crackles or wheezing audible.  Respirations even and unlabored.   RADIAL PULSE:  Equal bilaterally.  ABDOMEN:  Soft.  Nontender.  Bowel sounds present and normal.  No audible abdominal bruit.    EXTREMITIES:  No increased edema present.  DP pulses palpable and equal bilaterally.     SKIN: no rash.        Assessment & Plan:  HEALTH MAINTENANCE.  Gets his prostate checks and PSA through urology.  Colonoscopy - Dr Santa Lighter.  Overdue.  He is going to call and schedule his own appointment.  Will let me know the date and if need for referral.    I spent 25 minutes with the patient and more than 50% of the time was spent in consultation regarding the above (specifically discussing his current treatment plan, sleeping, acid reflux an dneed for colonoscopy).

## 2013-08-02 NOTE — Assessment & Plan Note (Signed)
Recent abdominal ultrasound revealed a mildly enlarged spleen.  Will follow.  Follow platelet count.  Last check improved - 140.  Recheck cbc with next labs.

## 2013-08-04 ENCOUNTER — Telehealth: Payer: Self-pay

## 2013-08-04 DIAGNOSIS — Z1211 Encounter for screening for malignant neoplasm of colon: Secondary | ICD-10-CM

## 2013-08-04 NOTE — Telephone Encounter (Signed)
It appears that Dr Gilford Rile has placed the order for the referral.  Please forward my note.  He is due for screening colonoscopy.  Also requires a long term proton pump inhibitor and has never had an EGD.  Please forward this information with reason for referral.  Thanks.

## 2013-08-04 NOTE — Telephone Encounter (Signed)
Opened in error

## 2013-08-04 NOTE — Telephone Encounter (Signed)
Referral order placed.

## 2013-08-04 NOTE — Telephone Encounter (Signed)
The patient's wife called and stated the patient has an appointment with his GI doctor on Monday.  However, the GI office he is going to is asking that we send over a referral.  He is hoping to get a colonoscopy.  GI office - Dr.Julia Tang with [facility]  [phone] [phone]   Can this referral entered? Thanks so much!

## 2013-08-05 ENCOUNTER — Other Ambulatory Visit (INDEPENDENT_AMBULATORY_CARE_PROVIDER_SITE_OTHER): Payer: BC Managed Care – PPO

## 2013-08-05 DIAGNOSIS — G4730 Sleep apnea, unspecified: Secondary | ICD-10-CM

## 2013-08-05 DIAGNOSIS — F419 Anxiety disorder, unspecified: Secondary | ICD-10-CM

## 2013-08-05 DIAGNOSIS — I10 Essential (primary) hypertension: Secondary | ICD-10-CM

## 2013-08-05 DIAGNOSIS — E785 Hyperlipidemia, unspecified: Secondary | ICD-10-CM

## 2013-08-05 DIAGNOSIS — R739 Hyperglycemia, unspecified: Secondary | ICD-10-CM

## 2013-08-05 DIAGNOSIS — R7309 Other abnormal glucose: Secondary | ICD-10-CM

## 2013-08-05 DIAGNOSIS — F411 Generalized anxiety disorder: Secondary | ICD-10-CM

## 2013-08-05 LAB — BASIC METABOLIC PANEL
BUN: 19 mg/dL (ref 6–23)
CALCIUM: 9.1 mg/dL (ref 8.4–10.5)
CO2: 28 mEq/L (ref 19–32)
Chloride: 103 mEq/L (ref 96–112)
Creatinine, Ser: 1.2 mg/dL (ref 0.4–1.5)
GFR: 68.3 mL/min (ref >60.00)
GLUCOSE: 108 mg/dL — AB (ref 70–99)
Potassium: 3.7 mEq/L (ref 3.5–5.1)
SODIUM: 140 meq/L (ref 135–145)

## 2013-08-05 LAB — HEPATIC FUNCTION PANEL
ALT: 25 U/L (ref 0–53)
AST: 28 U/L (ref 0–37)
Albumin: 4.4 g/dL (ref 3.5–5.2)
Alkaline Phosphatase: 36 U/L — ABNORMAL LOW (ref 39–117)
BILIRUBIN DIRECT: 0 mg/dL (ref 0.0–0.3)
BILIRUBIN TOTAL: 0.9 mg/dL (ref 0.2–1.2)
Total Protein: 6.8 g/dL (ref 6.0–8.3)

## 2013-08-05 LAB — LIPID PANEL
CHOLESTEROL: 107 mg/dL (ref 0–200)
HDL: 29 mg/dL — ABNORMAL LOW (ref >39.00)
LDL CALC: 49 mg/dL (ref 0–99)
NONHDL: 78
Total CHOL/HDL Ratio: 4
Triglycerides: 145 mg/dL (ref 0.0–149.0)
VLDL: 29 mg/dL (ref 0.0–40.0)

## 2013-08-05 LAB — CBC WITH DIFFERENTIAL/PLATELET
Basophils Absolute: 0 10*3/uL (ref 0.0–0.1)
Basophils Relative: 0.4 % (ref 0.0–3.0)
Eosinophils Absolute: 0.1 10*3/uL (ref 0.0–0.7)
Eosinophils Relative: 1.3 % (ref 0.0–5.0)
HEMATOCRIT: 43.6 % (ref 39.0–52.0)
HEMOGLOBIN: 14.8 g/dL (ref 13.0–17.0)
LYMPHS ABS: 1.2 10*3/uL (ref 0.7–4.0)
Lymphocytes Relative: 20 % (ref 12.0–46.0)
MCHC: 34 g/dL (ref 30.0–36.0)
MCV: 92.5 fl (ref 78.0–100.0)
MONO ABS: 0.4 10*3/uL (ref 0.1–1.0)
Monocytes Relative: 6.5 % (ref 3.0–12.0)
NEUTROS ABS: 4.3 10*3/uL (ref 1.4–7.7)
Neutrophils Relative %: 71.8 % (ref 43.0–77.0)
Platelets: 141 10*3/uL — ABNORMAL LOW (ref 150.0–400.0)
RBC: 4.71 Mil/uL (ref 4.22–5.81)
RDW: 13.3 % (ref 11.5–15.5)
WBC: 6 10*3/uL (ref 4.0–10.5)

## 2013-08-05 LAB — HEMOGLOBIN A1C: HEMOGLOBIN A1C: 6 % (ref 4.6–6.5)

## 2013-08-05 LAB — TSH: TSH: 1.78 u[IU]/mL (ref 0.35–4.50)

## 2013-08-07 ENCOUNTER — Encounter: Payer: Self-pay | Admitting: Internal Medicine

## 2013-08-12 DIAGNOSIS — K5900 Constipation, unspecified: Secondary | ICD-10-CM | POA: Insufficient documentation

## 2013-08-23 LAB — HM COLONOSCOPY

## 2013-08-24 ENCOUNTER — Encounter: Payer: Self-pay | Admitting: Cardiovascular Disease

## 2013-08-24 ENCOUNTER — Ambulatory Visit (INDEPENDENT_AMBULATORY_CARE_PROVIDER_SITE_OTHER): Payer: BC Managed Care – PPO | Admitting: Cardiovascular Disease

## 2013-08-24 VITALS — BP 130/92 | HR 81 | Ht 72.0 in | Wt 233.8 lb

## 2013-08-24 DIAGNOSIS — G4730 Sleep apnea, unspecified: Secondary | ICD-10-CM

## 2013-08-24 DIAGNOSIS — E785 Hyperlipidemia, unspecified: Secondary | ICD-10-CM

## 2013-08-24 DIAGNOSIS — I2510 Atherosclerotic heart disease of native coronary artery without angina pectoris: Secondary | ICD-10-CM

## 2013-08-24 DIAGNOSIS — R0789 Other chest pain: Secondary | ICD-10-CM

## 2013-08-24 DIAGNOSIS — I2584 Coronary atherosclerosis due to calcified coronary lesion: Secondary | ICD-10-CM

## 2013-08-24 DIAGNOSIS — R454 Irritability and anger: Secondary | ICD-10-CM

## 2013-08-24 DIAGNOSIS — I10 Essential (primary) hypertension: Secondary | ICD-10-CM

## 2013-08-24 NOTE — Patient Instructions (Signed)
You are doing well. No medication changes were made.  Please call us if you have new issues that need to be addressed before your next appt.  Your physician wants you to follow-up in: 12 months.  You will receive a reminder letter in the mail two months in advance. If you don't receive a letter, please call our office to schedule the follow-up appointment. 

## 2013-08-24 NOTE — Assessment & Plan Note (Signed)
Currently with no symptoms of angina. No further workup at this time. Continue current medication regimen. 

## 2013-08-24 NOTE — Assessment & Plan Note (Signed)
Cholesterol is at goal on the current lipid regimen. No changes to the medications were made.  

## 2013-08-24 NOTE — Assessment & Plan Note (Signed)
Sleeping well with trazodone and his CPAP

## 2013-08-24 NOTE — Assessment & Plan Note (Signed)
Continues to have some issues, not at work yet though feels like he can engage people better. Recommended a regular exercise program, more than 2 days with his personal trainer

## 2013-08-24 NOTE — Progress Notes (Signed)
Patient ID: Keith Gross., male    DOB: 06-Jun-1955, 58 y.o.   MRN: [id number]  HPI Comments: [name] is a very pleasant 58 year old gentleman, patient of Dr. Einar Pheasant and Edrick Oh, remote history of syncope in 2010 felt secondary to low blood pressure that improved with medication changes,  with chest pain symptoms at the beginning of  April 2013, stress test showing inferior lateral hypoperfusion consistent with large region of scar which was new from 2010, cardiac catheterization showing severe ramus disease, with DES stent placed in May 2013. Followup catheterization September 2013 for chest pain showing patent stent, distal left circumflex disease noted, moderate in nature. Medical management recommended.  In followup today as on previous visits, he is active, works out 2 days with a Physiological scientist.  In general he feels much better from his prior clinic visits. Initially was on Zoloft but after changing to Paxil, his chest pain resolved. Eventually was changed to Effexor work, Lamictal and trazodone for sleep. Marlowe Kays on this regimen he feels well with much better sleep, better mood, more energy, no significant chest pain. He does significant activities around the house and yard on days that he does not go to the gym.  He has not gone back to work.  Is wearing his CPAP for  sleep apnea He does have a previous history of smoking, quit in 1992. Father had his first heart attack at age 10.  EKG shows normal sinus rhythm with rate 81 beats per minute with old inferior MI, similar to EKG in June 2013  Outpatient Encounter Prescriptions as of 08/24/2013  Medication Sig  . allopurinol (ZYLOPRIM) 100 MG tablet Take 1 tablet (100 mg total) by mouth 2 (two) times daily.  Marland Kitchen amLODipine (NORVASC) 10 MG tablet TAKE 1 TABLET BY MOUTH EVERY DAY  . aspirin EC 81 MG tablet Take 81 mg by mouth daily.   Marland Kitchen atorvastatin (LIPITOR) 20 MG tablet TAKE ONE TABLET EVERY DAY  . CINNAMON PO Take 1,000 mg by  mouth 2 (two) times daily.   . clopidogrel (PLAVIX) 75 MG tablet Take 1 tablet (75 mg total) by mouth daily.  Marland Kitchen docusate sodium (COLACE) 100 MG capsule Take 100 mg by mouth 2 (two) times daily.   . fexofenadine (ALLEGRA) 180 MG tablet Take 1 tablet (180 mg total) by mouth daily.  Marland Kitchen FIBER PO Take 2 tablets by mouth daily.  . fluticasone (FLONASE) 50 MCG/ACT nasal spray Place 2 sprays into the nose daily.  . hydrochlorothiazide (HYDRODIURIL) 25 MG tablet Take 1 tablet (25 mg total) by mouth at bedtime.  Marland Kitchen Hydrocodone-Chlorpheniramine 5-4 MG/5ML SOLN Take by mouth as needed.   . lamoTRIgine (LAMICTAL) 100 MG tablet Take 100 mg by mouth daily.  . magnesium oxide (MAG-OX) 400 MG tablet Take 400 mg by mouth daily.  . metoprolol tartrate (LOPRESSOR) 25 MG tablet Take 1/2 tablet daily.  . nitroGLYCERIN (NITROSTAT) 0.4 MG SL tablet Place 1 tablet (0.4 mg total) under the tongue every 5 (five) minutes as needed for chest pain.  . pantoprazole (PROTONIX) 40 MG tablet TAKE ONE TABLET BY MOUTH DAILY  . potassium citrate (UROCIT-K) 10 MEQ (1080 MG) SR tablet Take 10 mEq by mouth 2 (two) times daily.   . tamsulosin (FLOMAX) 0.4 MG CAPS Take 0.4 mg by mouth daily.  . Testosterone (ANDROGEL PUMP) 20.25 MG/ACT (1.62%) GEL Place 1 application onto the skin daily.  . TRAZODONE HCL PO Take 200 mg by mouth at bedtime.  Marland Kitchen  valsartan-hydrochlorothiazide (DIOVAN-HCT) 320-25 MG per tablet TAKE 1 TABLET BY MOUTH EVERY DAY  . venlafaxine XR (EFFEXOR-XR) 150 MG 24 hr capsule Take 150 mg by mouth daily with breakfast.  . VITAMIN D, CHOLECALCIFEROL, PO Take 2,000 mg by mouth daily.  . vitamin E 1000 UNIT capsule Take 1,000 Units by mouth daily.   Review of Systems  Constitutional: Negative.   HENT: Negative.   Eyes: Negative.   Respiratory: Negative.   Cardiovascular: Negative.   Gastrointestinal: Negative.   Endocrine: Negative.   Musculoskeletal: Negative.   Skin: Negative.   Allergic/Immunologic: Negative.    Neurological: Negative.   Hematological: Negative.   Psychiatric/Behavioral: The patient is nervous/anxious.   All other systems reviewed and are negative.   BP 130/92  Pulse 81  Ht 6' (1.829 m)  Wt 233 lb 12 oz (106.028 kg)  BMI 31.70 kg/m2  Physical Exam  Nursing note and vitals reviewed. Constitutional: He is oriented to person, place, and time. He appears well-developed and well-nourished.  HENT:  Head: Normocephalic.  Nose: Nose normal.  Mouth/Throat: Oropharynx is clear and moist.  Eyes: Conjunctivae are normal. Pupils are equal, round, and reactive to light.  Neck: Normal range of motion. Neck supple. No JVD present.  Cardiovascular: Normal rate, regular rhythm, S1 normal, S2 normal, normal heart sounds and intact distal pulses.  Exam reveals no gallop and no friction rub.   No murmur heard. Pulmonary/Chest: Effort normal and breath sounds normal. No respiratory distress. He has no wheezes. He has no rales. He exhibits no tenderness.  Abdominal: Soft. Bowel sounds are normal. He exhibits no distension. There is no tenderness.  Musculoskeletal: Normal range of motion. He exhibits no edema and no tenderness.  Lymphadenopathy:    He has no cervical adenopathy.  Neurological: He is alert and oriented to person, place, and time. Coordination normal.  Skin: Skin is warm and dry. No rash noted. No erythema.  Psychiatric: He has a normal mood and affect. His behavior is normal. Judgment and thought content normal.      Assessment and Plan

## 2013-08-24 NOTE — Assessment & Plan Note (Signed)
No significant chest pressure reported on today's visit. Suspect previous symptoms were secondary to stress/anxiety/depression. Now improved on his current medications with better sleep

## 2013-08-24 NOTE — Assessment & Plan Note (Signed)
Blood pressure is well controlled on today's visit. No changes made to the medications. 

## 2013-09-16 ENCOUNTER — Other Ambulatory Visit: Payer: Self-pay | Admitting: Internal Medicine

## 2013-09-19 ENCOUNTER — Other Ambulatory Visit: Payer: Self-pay | Admitting: Cardiovascular Disease

## 2013-09-21 ENCOUNTER — Encounter: Payer: Self-pay | Admitting: Internal Medicine

## 2013-09-21 ENCOUNTER — Encounter: Payer: Self-pay | Admitting: *Deleted

## 2013-09-21 DIAGNOSIS — K2100 Gastro-esophageal reflux disease with esophagitis, without bleeding: Secondary | ICD-10-CM

## 2013-09-21 DIAGNOSIS — Z86010 Personal history of colonic polyps: Secondary | ICD-10-CM

## 2013-10-05 DIAGNOSIS — N486 Induration penis plastica: Secondary | ICD-10-CM | POA: Insufficient documentation

## 2013-10-11 ENCOUNTER — Encounter: Payer: Self-pay | Admitting: Internal Medicine

## 2013-10-18 ENCOUNTER — Other Ambulatory Visit: Payer: Self-pay | Admitting: Internal Medicine

## 2013-10-18 ENCOUNTER — Other Ambulatory Visit: Payer: Self-pay | Admitting: Cardiovascular Disease

## 2013-10-20 ENCOUNTER — Ambulatory Visit (INDEPENDENT_AMBULATORY_CARE_PROVIDER_SITE_OTHER): Payer: BC Managed Care – PPO | Admitting: *Deleted

## 2013-10-20 DIAGNOSIS — Z23 Encounter for immunization: Secondary | ICD-10-CM

## 2013-10-28 ENCOUNTER — Other Ambulatory Visit: Payer: BC Managed Care – PPO

## 2013-10-31 ENCOUNTER — Other Ambulatory Visit (INDEPENDENT_AMBULATORY_CARE_PROVIDER_SITE_OTHER): Payer: BC Managed Care – PPO

## 2013-10-31 ENCOUNTER — Telehealth: Payer: Self-pay | Admitting: *Deleted

## 2013-10-31 DIAGNOSIS — R945 Abnormal results of liver function studies: Secondary | ICD-10-CM

## 2013-10-31 DIAGNOSIS — K769 Liver disease, unspecified: Secondary | ICD-10-CM

## 2013-10-31 DIAGNOSIS — R739 Hyperglycemia, unspecified: Secondary | ICD-10-CM

## 2013-10-31 DIAGNOSIS — I10 Essential (primary) hypertension: Secondary | ICD-10-CM

## 2013-10-31 DIAGNOSIS — D696 Thrombocytopenia, unspecified: Secondary | ICD-10-CM

## 2013-10-31 DIAGNOSIS — R7309 Other abnormal glucose: Secondary | ICD-10-CM

## 2013-10-31 DIAGNOSIS — E785 Hyperlipidemia, unspecified: Secondary | ICD-10-CM

## 2013-10-31 LAB — HEMOGLOBIN A1C: HEMOGLOBIN A1C: 5.9 % (ref 4.6–6.5)

## 2013-10-31 LAB — CBC WITH DIFFERENTIAL/PLATELET
Basophils Absolute: 0 10*3/uL (ref 0.0–0.1)
Basophils Relative: 0.6 % (ref 0.0–3.0)
EOS PCT: 1.6 % (ref 0.0–5.0)
Eosinophils Absolute: 0.1 10*3/uL (ref 0.0–0.7)
HEMATOCRIT: 42.6 % (ref 39.0–52.0)
HEMOGLOBIN: 14.4 g/dL (ref 13.0–17.0)
LYMPHS ABS: 1.2 10*3/uL (ref 0.7–4.0)
Lymphocytes Relative: 18.2 % (ref 12.0–46.0)
MCHC: 33.8 g/dL (ref 30.0–36.0)
MCV: 93.5 fl (ref 78.0–100.0)
Monocytes Absolute: 0.6 10*3/uL (ref 0.1–1.0)
Monocytes Relative: 8.7 % (ref 3.0–12.0)
NEUTROS ABS: 4.7 10*3/uL (ref 1.4–7.7)
Neutrophils Relative %: 70.9 % (ref 43.0–77.0)
Platelets: 135 10*3/uL — ABNORMAL LOW (ref 150.0–400.0)
RBC: 4.56 Mil/uL (ref 4.22–5.81)
RDW: 13.7 % (ref 11.5–15.5)
WBC: 6.6 10*3/uL (ref 4.0–10.5)

## 2013-10-31 LAB — BASIC METABOLIC PANEL
BUN: 24 mg/dL — ABNORMAL HIGH (ref 6–23)
CO2: 31 mEq/L (ref 19–32)
Calcium: 9.2 mg/dL (ref 8.4–10.5)
Chloride: 104 mEq/L (ref 96–112)
Creatinine, Ser: 1 mg/dL (ref 0.4–1.5)
GFR: 80.86 mL/min (ref >60.00)
GLUCOSE: 92 mg/dL (ref 70–99)
POTASSIUM: 3.7 meq/L (ref 3.5–5.1)
SODIUM: 143 meq/L (ref 135–145)

## 2013-10-31 LAB — LIPID PANEL
CHOL/HDL RATIO: 4
CHOLESTEROL: 118 mg/dL (ref 0–200)
HDL: 29.1 mg/dL — ABNORMAL LOW (ref >39.00)
LDL CALC: 69 mg/dL (ref 0–99)
NONHDL: 88.9
Triglycerides: 100 mg/dL (ref 0.0–149.0)
VLDL: 20 mg/dL (ref 0.0–40.0)

## 2013-10-31 LAB — HEPATIC FUNCTION PANEL
ALT: 26 U/L (ref 0–53)
AST: 30 U/L (ref 0–37)
Albumin: 4.2 g/dL (ref 3.5–5.2)
Alkaline Phosphatase: 38 U/L — ABNORMAL LOW (ref 39–117)
BILIRUBIN TOTAL: 0.4 mg/dL (ref 0.2–1.2)
Bilirubin, Direct: 0.1 mg/dL (ref 0.0–0.3)
Total Protein: 6.8 g/dL (ref 6.0–8.3)

## 2013-10-31 NOTE — Telephone Encounter (Signed)
Order placed for labs.

## 2013-10-31 NOTE — Telephone Encounter (Signed)
What labs and dx?  

## 2013-11-01 ENCOUNTER — Encounter: Payer: Self-pay | Admitting: Internal Medicine

## 2013-11-03 DIAGNOSIS — R7301 Impaired fasting glucose: Secondary | ICD-10-CM | POA: Insufficient documentation

## 2013-11-03 NOTE — Telephone Encounter (Signed)
Unread mychart message mailed to patient 

## 2013-11-08 DIAGNOSIS — D229 Melanocytic nevi, unspecified: Secondary | ICD-10-CM | POA: Insufficient documentation

## 2013-11-14 ENCOUNTER — Ambulatory Visit (INDEPENDENT_AMBULATORY_CARE_PROVIDER_SITE_OTHER): Payer: BC Managed Care – PPO | Admitting: Internal Medicine

## 2013-11-14 ENCOUNTER — Encounter: Payer: Self-pay | Admitting: Internal Medicine

## 2013-11-14 VITALS — BP 110/70 | HR 87 | Temp 98.8°F | Ht 72.0 in | Wt 222.2 lb

## 2013-11-14 DIAGNOSIS — E785 Hyperlipidemia, unspecified: Secondary | ICD-10-CM

## 2013-11-14 DIAGNOSIS — K769 Liver disease, unspecified: Secondary | ICD-10-CM

## 2013-11-14 DIAGNOSIS — M109 Gout, unspecified: Secondary | ICD-10-CM

## 2013-11-14 DIAGNOSIS — Z86010 Personal history of colonic polyps: Secondary | ICD-10-CM

## 2013-11-14 DIAGNOSIS — I10 Essential (primary) hypertension: Secondary | ICD-10-CM

## 2013-11-14 DIAGNOSIS — F411 Generalized anxiety disorder: Secondary | ICD-10-CM

## 2013-11-14 DIAGNOSIS — K2100 Gastro-esophageal reflux disease with esophagitis, without bleeding: Secondary | ICD-10-CM

## 2013-11-14 DIAGNOSIS — R7309 Other abnormal glucose: Secondary | ICD-10-CM

## 2013-11-14 DIAGNOSIS — R739 Hyperglycemia, unspecified: Secondary | ICD-10-CM

## 2013-11-14 DIAGNOSIS — I2510 Atherosclerotic heart disease of native coronary artery without angina pectoris: Secondary | ICD-10-CM

## 2013-11-14 DIAGNOSIS — F419 Anxiety disorder, unspecified: Secondary | ICD-10-CM

## 2013-11-14 DIAGNOSIS — R945 Abnormal results of liver function studies: Secondary | ICD-10-CM

## 2013-11-14 DIAGNOSIS — D696 Thrombocytopenia, unspecified: Secondary | ICD-10-CM

## 2013-11-14 DIAGNOSIS — I2584 Coronary atherosclerosis due to calcified coronary lesion: Secondary | ICD-10-CM

## 2013-11-14 DIAGNOSIS — G4730 Sleep apnea, unspecified: Secondary | ICD-10-CM

## 2013-11-14 DIAGNOSIS — N200 Calculus of kidney: Secondary | ICD-10-CM

## 2013-11-14 NOTE — Progress Notes (Signed)
Subjective:    Patient ID: Keith Petz., male    DOB: [birth year], 58 y.o.   MRN: [id number]  HPI 59 year old male with past history of CAD with stent placed 5/13 (followed by Dr Rockey Situ), hypercholesterolemia, hypertension, nephrolithiasis, gout and sleep apnea.  He comes in today for a scheduled follow up.   Sees Dr Jacqlyn Larsen.  He follows his PSA and follows him for kidney stones.  On flomax and doing well.  Just recently evaluated.  See Dr Bjorn Loser note for details.  Seeing Dr Nicolasa Ducking.  Was sleep deprived.  On Lamictal, effexor and trazodone.  Recently had his lamictal increased. Sleeping better. Feels better.   Using CPAP.   Uses his CPAP nightly.  Uses it for at least 7 hours per night.  Needs new supplies.  His strap his too big.  Needs a new mask, tubing and filter.  He also sees Dr Rockey Situ.   Just evaluated.  Felt doing well.  Recommended yearly f/u.  He is exercising.  Breathing stable.   No chest pain or tightness.  Had adjusted his diet and lost weight.    Increased anxiety and stress with his job.   Doing better.     Past Medical History  Diagnosis Date  . GERD (gastroesophageal reflux disease)   . Hypertension   . Anxiety   . Gout     Multiple episodes  . Dysplastic polyp of colon 2008    [facility]  . Treadmill stress test negative for angina pectoris 2011  . Syncopal episodes   . Hyperlipidemia   . Sleep apnea     on CPAP  . Angina   . Myocardial infarction 05/2011    "we think";  06/2011 abnl mv;  06/2011 Cath - subtotal occlusion of Ramus, otw nonobs dzs;  06/2011 PCI/DES of Ramus w/ 2.25x104mm Resolute DES   . Kidney stones   . Diastasis of muscle 2011    "abdominal"    Current Outpatient Prescriptions on File Prior to Visit  Medication Sig Dispense Refill  . allopurinol (ZYLOPRIM) 100 MG tablet TAKE ONE TABLET BY MOUTH TWICE DAILY  180 tablet  1  . amLODipine (NORVASC) 10 MG tablet TAKE 1 TABLET BY MOUTH EVERY DAY  90 tablet  3  . aspirin EC 81 MG tablet Take 81 mg by mouth daily.        Marland Kitchen atorvastatin (LIPITOR) 20 MG tablet TAKE ONE TABLET EVERY DAY  90 tablet  3  . CINNAMON PO Take 1,000 mg by mouth 2 (two) times daily.       . clopidogrel (PLAVIX) 75 MG tablet TAKE ONE TABLET BY MOUTH EVERY DAY  90 tablet  3  . docusate sodium (COLACE) 100 MG capsule Take 100 mg by mouth 2 (two) times daily.       . fexofenadine (ALLEGRA) 180 MG tablet TAKE ONE TABLET BY MOUTH EVERY DAY  30 tablet  3  . FIBER PO Take 2 tablets by mouth daily.      . fluticasone (FLONASE) 50 MCG/ACT nasal spray Place 2 sprays into the nose daily.  48 g  3  . hydrochlorothiazide (HYDRODIURIL) 25 MG tablet TAKE ONE TABLET BY MOUTH AT BEDTIME  90 tablet  3  . Hydrocodone-Chlorpheniramine 5-4 MG/5ML SOLN Take by mouth as needed.       . magnesium oxide (MAG-OX) 400 MG tablet Take 400 mg by mouth daily.      . metoprolol tartrate (LOPRESSOR) 25 MG tablet Take 1/2  tablet daily.  45 tablet  3  . nitroGLYCERIN (NITROSTAT) 0.4 MG SL tablet Place 1 tablet (0.4 mg total) under the tongue every 5 (five) minutes as needed for chest pain.  25 tablet  3  . pantoprazole (PROTONIX) 40 MG tablet TAKE ONE TABLET EVERY DAY  90 tablet  0  . potassium citrate (UROCIT-K) 10 MEQ (1080 MG) SR tablet Take 10 mEq by mouth 2 (two) times daily.       . tamsulosin (FLOMAX) 0.4 MG CAPS Take 0.4 mg by mouth daily.      . Testosterone (ANDROGEL PUMP) 20.25 MG/ACT (1.62%) GEL Place 1 application onto the skin daily.      . TRAZODONE HCL PO Take 200 mg by mouth at bedtime.      . valsartan-hydrochlorothiazide (DIOVAN-HCT) 320-25 MG per tablet TAKE 1 TABLET BY MOUTH EVERY DAY  90 tablet  3  . venlafaxine XR (EFFEXOR-XR) 150 MG 24 hr capsule Take 150 mg by mouth daily with breakfast.      . VITAMIN D, CHOLECALCIFEROL, PO Take 2,000 mg by mouth daily.      . vitamin E 1000 UNIT capsule Take 1,000 Units by mouth daily.       No current facility-administered medications on file prior to visit.    Review of Systems Patient denies any  headache, lightheadedness or dizziness.  No significant sinus or allergy symptoms currently.  No palpitations.  No further chest pain or tightness.  No increased shortness of breath, cough or congestion.  Breathing is stable.  Using CPAP regularly.  See above.   No nausea or vomiting.  Acid reflux controlled if takes his medication.  Needs the medication to control.   No abdominal pain or cramping.  No bowel change, such as diarrhea, constipation, BRBPR or melana.  On flomax and doing well.  Just evaluated by Dr Jacqlyn Larsen.  PSA .3 per report.  Blood pressure has been doing well.  Increased stress as outlined.  Feels better on his current regimen.  Sleeping better.  Overall doing well.       Objective:   Physical Exam  Filed Vitals:   11/14/13 0929  BP: 110/70  Pulse: 87  Temp: 98.8 F (37.1 C)   Blood pressure recheck:  43/28  58 year old male in no acute distress.  HEENT:  Nares - clear.  Oropharynx - without lesions. NECK:  Supple.  Nontender.  No audible carotid bruit.  HEART:  Appears to be regular.   LUNGS:  No crackles or wheezing audible.  Respirations even and unlabored.   RADIAL PULSE:  Equal bilaterally.  ABDOMEN:  Soft.  Nontender.  Bowel sounds present and normal.  No audible abdominal bruit.    EXTREMITIES:  No increased edema present.  DP pulses palpable and equal bilaterally.        Assessment & Plan:  HEALTH MAINTENANCE.  Gets his prostate checks and PSA through urology.  Colonoscopy - Dr Santa Lighter.  Just performed.    I spent 25 minutes with the patient and more than 50% of the time was spent in consultation regarding the above.

## 2013-11-14 NOTE — Progress Notes (Signed)
Pre visit review using our clinic review tool, if applicable. No additional management support is needed unless otherwise documented below in the visit note. 

## 2013-11-15 ENCOUNTER — Encounter: Payer: Self-pay | Admitting: Internal Medicine

## 2013-11-15 NOTE — Assessment & Plan Note (Signed)
Just had colonoscopy 08/23/13.  Hyperplastic polyp.  Recommended f/u colonoscopy in five years.

## 2013-11-15 NOTE — Assessment & Plan Note (Signed)
Blood pressure under good control.  Follow.   

## 2013-11-15 NOTE — Assessment & Plan Note (Signed)
Low cholesterol diet and exercise.  Follow lipid profile.  Recent cholesterol panel wnl except for low HDL.  Continue exercise and same med regimen.  Follow.

## 2013-11-15 NOTE — Assessment & Plan Note (Signed)
Liver panel 10/31/13 - wnl.

## 2013-11-15 NOTE — Assessment & Plan Note (Signed)
Followed by Dr Jacqlyn Larsen.  Just evaluated.  Stable.  Asymptomatic.

## 2013-11-15 NOTE — Assessment & Plan Note (Signed)
Uses his CPAP every night.  States uses at least 7 hours.  Needs new equipment.  Needs a new strap, filter, hose/tubing and mask.

## 2013-11-15 NOTE — Assessment & Plan Note (Signed)
Controlled on current regimen.  Follow.   Off antiinflammatories.  EGD 08/23/13 - gastritis.

## 2013-11-15 NOTE — Assessment & Plan Note (Signed)
Has known disease s/p stent placement.  Stent placed in 5/13.  Sees Dr Rockey Situ.  Currently without symptoms.  Continue risk factor modification.  Has adjusted his diet.  Lost weight.

## 2013-11-15 NOTE — Assessment & Plan Note (Signed)
Abdominal ultrasound revealed a mildly enlarged spleen.  Will follow.  Follow platelet count.  Last check stable (130-140).  Recheck cbc with next labs.

## 2013-11-15 NOTE — Assessment & Plan Note (Signed)
No recent flares.  On allopurinol.   

## 2013-11-15 NOTE — Assessment & Plan Note (Signed)
Seeing Dr Nicolasa Ducking.  Doing better on his current medication regimen.  Recently had Lamictal increased.  Feels better.  Follow.

## 2013-11-15 NOTE — Assessment & Plan Note (Signed)
Low carb diet.  Follow.  A1c just checked 10/31/13 - 5.9.

## 2013-11-22 ENCOUNTER — Other Ambulatory Visit: Payer: Self-pay | Admitting: Cardiovascular Disease

## 2013-12-02 ENCOUNTER — Other Ambulatory Visit: Payer: Self-pay | Admitting: *Deleted

## 2013-12-02 MED ORDER — FLUTICASONE PROPIONATE 50 MCG/ACT NA SUSP: 2.0000 | Freq: Every day | NASAL | Status: DC

## 2013-12-14 ENCOUNTER — Other Ambulatory Visit: Payer: Self-pay | Admitting: Internal Medicine

## 2013-12-15 ENCOUNTER — Encounter: Payer: Self-pay | Admitting: Internal Medicine

## 2014-01-10 ENCOUNTER — Telehealth: Payer: Self-pay | Admitting: Internal Medicine

## 2014-01-10 ENCOUNTER — Encounter: Payer: Self-pay | Admitting: Internal Medicine

## 2014-01-10 NOTE — Telephone Encounter (Signed)
My chart message sent to pt to inform him rx signed and faxed for CPAP supplies and for autotitration.  My chart message accidentally closed.

## 2014-01-16 ENCOUNTER — Other Ambulatory Visit: Payer: Self-pay | Admitting: Cardiovascular Disease

## 2014-01-26 ENCOUNTER — Encounter (HOSPITAL_COMMUNITY): Payer: Self-pay | Admitting: Cardiovascular Disease

## 2014-03-02 ENCOUNTER — Telehealth: Payer: Self-pay

## 2014-03-02 NOTE — Telephone Encounter (Signed)
Received cardiac clearance request from [facility] for pt to have inflatable penile prosthesis. Per Christell Faith, PA: "Ok to hold Plavix x 5 days prior.  Recommend continuing aspirin 81 mg daily.  Restart Plavix 75 mg daily as soon as felt safe." Faxed to the attn of Quay Burow, NP at [phone].

## 2014-03-15 ENCOUNTER — Encounter: Payer: Self-pay | Admitting: Internal Medicine

## 2014-03-16 MED ORDER — POLYETHYLENE GLYCOL 3350 17 GM/SCOOP PO POWD: ORAL | Status: DC

## 2014-03-16 NOTE — Telephone Encounter (Signed)
rx sent in for miralax (3 month supply ) with 1 refill.  Pt notified via my chart.

## 2014-04-19 ENCOUNTER — Other Ambulatory Visit: Payer: Self-pay | Admitting: Cardiovascular Disease

## 2014-04-20 ENCOUNTER — Other Ambulatory Visit: Payer: Self-pay | Admitting: *Deleted

## 2014-04-20 MED ORDER — PANTOPRAZOLE SODIUM 40 MG PO TBEC: 40.0000 mg | DELAYED_RELEASE_TABLET | Freq: Every day | ORAL | Status: DC

## 2014-05-11 ENCOUNTER — Other Ambulatory Visit: Payer: BC Managed Care – PPO

## 2014-05-15 ENCOUNTER — Ambulatory Visit: Payer: BC Managed Care – PPO | Admitting: Internal Medicine

## 2014-05-24 ENCOUNTER — Other Ambulatory Visit (INDEPENDENT_AMBULATORY_CARE_PROVIDER_SITE_OTHER): Payer: BLUE CROSS/BLUE SHIELD

## 2014-05-24 DIAGNOSIS — R739 Hyperglycemia, unspecified: Secondary | ICD-10-CM

## 2014-05-24 DIAGNOSIS — I10 Essential (primary) hypertension: Secondary | ICD-10-CM | POA: Diagnosis not present

## 2014-05-24 DIAGNOSIS — D696 Thrombocytopenia, unspecified: Secondary | ICD-10-CM

## 2014-05-24 DIAGNOSIS — E785 Hyperlipidemia, unspecified: Secondary | ICD-10-CM

## 2014-05-24 LAB — LIPID PANEL
CHOLESTEROL: 123 mg/dL (ref 0–200)
HDL: 31.4 mg/dL — AB (ref >39.00)
LDL Cholesterol: 66 mg/dL (ref 0–99)
NonHDL: 91.6
TRIGLYCERIDES: 129 mg/dL (ref 0.0–149.0)
Total CHOL/HDL Ratio: 4
VLDL: 25.8 mg/dL (ref 0.0–40.0)

## 2014-05-24 LAB — CBC WITH DIFFERENTIAL/PLATELET
BASOS PCT: 0.5 % (ref 0.0–3.0)
Basophils Absolute: 0 10*3/uL (ref 0.0–0.1)
Eosinophils Absolute: 0.1 10*3/uL (ref 0.0–0.7)
Eosinophils Relative: 1.1 % (ref 0.0–5.0)
HEMATOCRIT: 41.5 % (ref 39.0–52.0)
Hemoglobin: 14.1 g/dL (ref 13.0–17.0)
LYMPHS ABS: 1.2 10*3/uL (ref 0.7–4.0)
Lymphocytes Relative: 16.9 % (ref 12.0–46.0)
MCHC: 34 g/dL (ref 30.0–36.0)
MCV: 89.3 fl (ref 78.0–100.0)
Monocytes Absolute: 0.5 10*3/uL (ref 0.1–1.0)
Monocytes Relative: 7.6 % (ref 3.0–12.0)
NEUTROS ABS: 5.4 10*3/uL (ref 1.4–7.7)
Neutrophils Relative %: 73.9 % (ref 43.0–77.0)
Platelets: 157 10*3/uL (ref 150.0–400.0)
RBC: 4.65 Mil/uL (ref 4.22–5.81)
RDW: 14.8 % (ref 11.5–15.5)
WBC: 7.3 10*3/uL (ref 4.0–10.5)

## 2014-05-24 LAB — BASIC METABOLIC PANEL
BUN: 22 mg/dL (ref 6–23)
CALCIUM: 9.6 mg/dL (ref 8.4–10.5)
CO2: 31 meq/L (ref 19–32)
Chloride: 99 mEq/L (ref 96–112)
Creatinine, Ser: 1.06 mg/dL (ref 0.40–1.50)
GFR: 76.32 mL/min (ref >60.00)
GLUCOSE: 99 mg/dL (ref 70–99)
Potassium: 3.7 mEq/L (ref 3.5–5.1)
SODIUM: 137 meq/L (ref 135–145)

## 2014-05-24 LAB — HEPATIC FUNCTION PANEL
ALK PHOS: 54 U/L (ref 39–117)
ALT: 19 U/L (ref 0–53)
AST: 22 U/L (ref 0–37)
Albumin: 4.4 g/dL (ref 3.5–5.2)
BILIRUBIN TOTAL: 0.5 mg/dL (ref 0.2–1.2)
Bilirubin, Direct: 0.1 mg/dL (ref 0.0–0.3)
Total Protein: 6.8 g/dL (ref 6.0–8.3)

## 2014-05-24 LAB — HEMOGLOBIN A1C: HEMOGLOBIN A1C: 4.7 % (ref 4.6–6.5)

## 2014-05-25 ENCOUNTER — Encounter: Payer: Self-pay | Admitting: Internal Medicine

## 2014-05-26 ENCOUNTER — Ambulatory Visit (INDEPENDENT_AMBULATORY_CARE_PROVIDER_SITE_OTHER): Payer: BLUE CROSS/BLUE SHIELD | Admitting: Internal Medicine

## 2014-05-26 ENCOUNTER — Encounter: Payer: Self-pay | Admitting: Internal Medicine

## 2014-05-26 VITALS — BP 115/89 | HR 80 | Temp 97.9°F | Ht 72.0 in | Wt 229.0 lb

## 2014-05-26 DIAGNOSIS — I10 Essential (primary) hypertension: Secondary | ICD-10-CM | POA: Diagnosis not present

## 2014-05-26 DIAGNOSIS — G4730 Sleep apnea, unspecified: Secondary | ICD-10-CM

## 2014-05-26 DIAGNOSIS — K7689 Other specified diseases of liver: Secondary | ICD-10-CM

## 2014-05-26 DIAGNOSIS — E785 Hyperlipidemia, unspecified: Secondary | ICD-10-CM

## 2014-05-26 DIAGNOSIS — Z860100 Personal history of colon polyps, unspecified: Secondary | ICD-10-CM

## 2014-05-26 DIAGNOSIS — K2100 Gastro-esophageal reflux disease with esophagitis, without bleeding: Secondary | ICD-10-CM

## 2014-05-26 DIAGNOSIS — F419 Anxiety disorder, unspecified: Secondary | ICD-10-CM

## 2014-05-26 DIAGNOSIS — I2510 Atherosclerotic heart disease of native coronary artery without angina pectoris: Secondary | ICD-10-CM | POA: Diagnosis not present

## 2014-05-26 DIAGNOSIS — M79645 Pain in left finger(s): Secondary | ICD-10-CM

## 2014-05-26 DIAGNOSIS — R945 Abnormal results of liver function studies: Secondary | ICD-10-CM

## 2014-05-26 DIAGNOSIS — I2584 Coronary atherosclerosis due to calcified coronary lesion: Secondary | ICD-10-CM

## 2014-05-26 DIAGNOSIS — D696 Thrombocytopenia, unspecified: Secondary | ICD-10-CM

## 2014-05-26 DIAGNOSIS — R739 Hyperglycemia, unspecified: Secondary | ICD-10-CM

## 2014-05-26 NOTE — Progress Notes (Signed)
Pre visit review using our clinic review tool, if applicable. No additional management support is needed unless otherwise documented below in the visit note. 

## 2014-05-26 NOTE — Progress Notes (Signed)
Patient ID: Keith Gross., male   DOB: 11-26-1955, 59 y.o.   MRN: [id number]   Subjective:    Patient ID: Keith Gross., male    DOB: [birth year], 59 y.o.   MRN: [id number]  HPI  Patient here for a scheduled follow up.  Recently had penile implant.  Has not been exercising as much.  Seeing urology.  Trying to watch what he eats.  No chest pain or tightness.  No sob.  Increased pain in his left thumb and first finger.  Has been persistent and is worsening.  Request referral.  Do not want him taking an increased amount of antiinflammatories.  Medications not helping.  Breathing stable.     Past Medical History  Diagnosis Date  . GERD (gastroesophageal reflux disease)   . Hypertension   . Anxiety   . Gout     Multiple episodes  . Dysplastic polyp of colon 2008    [facility]  . Treadmill stress test negative for angina pectoris 2011  . Syncopal episodes   . Hyperlipidemia   . Sleep apnea     on CPAP  . Angina   . Myocardial infarction 05/2011    "we think";  06/2011 abnl mv;  06/2011 Cath - subtotal occlusion of Ramus, otw nonobs dzs;  06/2011 PCI/DES of Ramus w/ 2.25x55mm Resolute DES   . Kidney stones   . Diastasis of muscle 2011    "abdominal"    Current Outpatient Prescriptions on File Prior to Visit  Medication Sig Dispense Refill  . allopurinol (ZYLOPRIM) 100 MG tablet TAKE ONE TABLET BY MOUTH TWICE DAILY 180 tablet 1  . amLODipine (NORVASC) 10 MG tablet TAKE 1 TABLET BY MOUTH EVERY DAY 90 tablet 3  . aspirin EC 81 MG tablet Take 81 mg by mouth daily.     Marland Kitchen atorvastatin (LIPITOR) 20 MG tablet TAKE ONE TABLET EVERY DAY 90 tablet 3  . CINNAMON PO Take 1,000 mg by mouth 2 (two) times daily.     . clopidogrel (PLAVIX) 75 MG tablet TAKE ONE TABLET BY MOUTH EVERY DAY 90 tablet 3  . fluticasone (FLONASE) 50 MCG/ACT nasal spray Place 2 sprays into both nostrils daily. 48 g 3  . hydrochlorothiazide (HYDRODIURIL) 25 MG tablet TAKE ONE TABLET BY MOUTH AT BEDTIME 90 tablet 3  .  Hydrocodone-Chlorpheniramine 5-4 MG/5ML SOLN Take by mouth as needed.     . magnesium oxide (MAG-OX) 400 MG tablet Take 400 mg by mouth daily.    . metoprolol tartrate (LOPRESSOR) 25 MG tablet TAKE ONE-HALF (1/2) TABLET BY MOUTH EVERY DAY 45 tablet 3  . pantoprazole (PROTONIX) 40 MG tablet Take 1 tablet (40 mg total) by mouth daily. 90 tablet 1  . polyethylene glycol powder (GLYCOLAX/MIRALAX) powder Use as directed. 850 g 1  . potassium citrate (UROCIT-K) 10 MEQ (1080 MG) SR tablet TAKE ONE TABLET BY MOUTH TWICE DAILY 180 each 1  . tamsulosin (FLOMAX) 0.4 MG CAPS Take 0.4 mg by mouth daily.    . Testosterone (ANDROGEL PUMP) 20.25 MG/ACT (1.62%) GEL Place 1 application onto the skin daily.    . TRAZODONE HCL PO Take 200 mg by mouth at bedtime.    . valsartan-hydrochlorothiazide (DIOVAN-HCT) 320-25 MG per tablet TAKE 1 TABLET BY MOUTH EVERY DAY 90 tablet 3  . venlafaxine XR (EFFEXOR-XR) 150 MG 24 hr capsule Take 150 mg by mouth daily with breakfast.    . VITAMIN D, CHOLECALCIFEROL, PO Take 2,000 mg by mouth daily.    Marland Kitchen  vitamin E 1000 UNIT capsule Take 1,000 Units by mouth daily.    . nitroGLYCERIN (NITROSTAT) 0.4 MG SL tablet Place 1 tablet (0.4 mg total) under the tongue every 5 (five) minutes as needed for chest pain. 25 tablet 3   No current facility-administered medications on file prior to visit.    Review of Systems  Constitutional: Negative for appetite change and unexpected weight change.  HENT: Negative for congestion and sinus pressure.   Respiratory: Negative for cough, chest tightness and shortness of breath.   Cardiovascular: Negative for chest pain, palpitations and leg swelling.  Gastrointestinal: Negative for nausea, vomiting, abdominal pain and diarrhea.  Genitourinary:       Recent penile implant.  Seeing urology.  Appears to be doing well.   Musculoskeletal:       Increased pain left thumb and left first finger.  Increased and persistent pain.   Skin: Negative for color  change and rash.  Neurological: Negative for dizziness, light-headedness and headaches.  Psychiatric/Behavioral: Negative for dysphoric mood and agitation.       Objective:    Physical Exam  Constitutional: He appears well-developed and well-nourished. No distress.  HENT:  Nose: Nose normal.  Mouth/Throat: Oropharynx is clear and moist.  Neck: Neck supple. No thyromegaly present.  Cardiovascular: Normal rate and regular rhythm.   Pulmonary/Chest: Effort normal and breath sounds normal. No respiratory distress.  Abdominal: Soft. Bowel sounds are normal. There is no tenderness.  Musculoskeletal: He exhibits no edema.  Increased pain left thumb and left first finger.    Lymphadenopathy:    He has no cervical adenopathy.  Skin: No rash noted. No erythema.  Psychiatric: He has a normal mood and affect. His behavior is normal.    BP 115/89 mmHg  Pulse 80  Temp(Src) 97.9 F (36.6 C) (Oral)  Ht 6' (1.829 m)  Wt 229 lb (103.874 kg)  BMI 31.05 kg/m2  SpO2 96% Wt Readings from Last 3 Encounters:  05/26/14 229 lb (103.874 kg)  11/14/13 222 lb 4 oz (100.812 kg)  08/24/13 233 lb 12 oz (106.028 kg)     Lab Results  Component Value Date   WBC 7.3 05/24/2014   HGB 14.1 05/24/2014   HCT 41.5 05/24/2014   PLT 157.0 05/24/2014   GLUCOSE 99 05/24/2014   CHOL 123 05/24/2014   TRIG 129.0 05/24/2014   HDL 31.40* 05/24/2014   LDLCALC 66 05/24/2014   ALT 19 05/24/2014   AST 22 05/24/2014   NA 137 05/24/2014   K 3.7 05/24/2014   CL 99 05/24/2014   CREATININE 1.06 05/24/2014   BUN 22 05/24/2014   CO2 31 05/24/2014   TSH 1.78 08/05/2013   INR 1.11 10/19/2011   HGBA1C 4.7 05/24/2014       Assessment & Plan:   Problem List Items Addressed This Visit    Abnormal liver function    Recent liver panel wnl.        Relevant Orders   Hepatic function panel   Anxiety    Doing better on current regimen.  Seeing Dr Nicolasa Ducking.       Relevant Orders   CBC with Differential/Platelet    TSH   CAD (coronary artery disease)    S/p stent placement 5/13.  Sees Dr Rockey Situ.  Currently doing well.  Continue risk factor modification.        GERD (gastroesophageal reflux disease)    EGD as outlined 08/23/13.  Currently symptoms controlled.  On protonix.  Relevant Medications   Psyllium (METAMUCIL PO)   History of colonic polyps    Colonoscopy 08/23/13 as outlined.  Recommend f/u colonoscopy in five years.        Hyperglycemia    Recent a1c improved.  Sugars doing well.  Continue a low carb diet and exercise.        Relevant Orders   Hemoglobin A1c   Hyperlipidemia    Low cholesterol diet and exercise. Good cholesterol improved some on recent check.  Follow.   Lab Results  Component Value Date   CHOL 123 05/24/2014   HDL 31.40* 05/24/2014   LDLCALC 66 05/24/2014   TRIG 129.0 05/24/2014   CHOLHDL 4 05/24/2014        Relevant Orders   Lipid panel   Hypertension - Primary    Blood pressure doing well.  Same medication regimen.  Follow metabolic panel.       Relevant Orders   Basic metabolic panel   Sleep apnea    Using CPAP.  Needs to have the settings remain on autotitration.  Does better with this.  Too much pressure when stays on one setting.  Follow.        Thrombocytopenia    Most recent platelet count wnl.  Follow.       Thumb pain    Persistent/worsening pain.  Not responding to conservative measures.  Request referral.  Refer to Dr Amedeo Plenty.        Relevant Orders   Ambulatory referral to Orthopedic Surgery     I spent 25 minutes with the patient and more than 50% of the time was spent in consultation regarding the above.     Einar Pheasant, MD

## 2014-06-04 ENCOUNTER — Encounter: Payer: Self-pay | Admitting: Internal Medicine

## 2014-06-04 NOTE — Assessment & Plan Note (Signed)
Most recent platelet count wnl.  Follow.

## 2014-06-04 NOTE — Assessment & Plan Note (Addendum)
Colonoscopy 08/23/13 as outlined.  Recommend f/u colonoscopy in five years.

## 2014-06-04 NOTE — Assessment & Plan Note (Signed)
Recent a1c improved.  Sugars doing well.  Continue a low carb diet and exercise.

## 2014-06-04 NOTE — Assessment & Plan Note (Signed)
Blood pressure doing well.  Same medication regimen.  Follow metabolic panel.   

## 2014-06-04 NOTE — Assessment & Plan Note (Signed)
Doing better on current regimen.  Seeing Dr Nicolasa Ducking.

## 2014-06-04 NOTE — Assessment & Plan Note (Signed)
Using CPAP.  Needs to have the settings remain on autotitration.  Does better with this.  Too much pressure when stays on one setting.  Follow.

## 2014-06-04 NOTE — Assessment & Plan Note (Signed)
Recent liver panel wnl.  

## 2014-06-04 NOTE — Assessment & Plan Note (Signed)
S/p stent placement 5/13.  Sees Dr Rockey Situ.  Currently doing well.  Continue risk factor modification.

## 2014-06-04 NOTE — Assessment & Plan Note (Signed)
Persistent/worsening pain.  Not responding to conservative measures.  Request referral.  Refer to Dr Amedeo Plenty.

## 2014-06-04 NOTE — Assessment & Plan Note (Signed)
EGD as outlined 08/23/13.  Currently symptoms controlled.  On protonix.

## 2014-06-04 NOTE — Assessment & Plan Note (Signed)
Low cholesterol diet and exercise. Good cholesterol improved some on recent check.  Follow.   Lab Results  Component Value Date   CHOL 123 05/24/2014   HDL 31.40* 05/24/2014   LDLCALC 66 05/24/2014   TRIG 129.0 05/24/2014   CHOLHDL 4 05/24/2014

## 2014-06-21 ENCOUNTER — Other Ambulatory Visit: Payer: Self-pay | Admitting: Cardiovascular Disease

## 2014-06-21 ENCOUNTER — Other Ambulatory Visit: Payer: Self-pay | Admitting: Internal Medicine

## 2014-07-21 ENCOUNTER — Other Ambulatory Visit: Payer: Self-pay | Admitting: Internal Medicine

## 2014-08-02 ENCOUNTER — Other Ambulatory Visit: Payer: Self-pay | Admitting: Cardiovascular Disease

## 2014-08-29 ENCOUNTER — Ambulatory Visit (INDEPENDENT_AMBULATORY_CARE_PROVIDER_SITE_OTHER): Payer: BLUE CROSS/BLUE SHIELD | Admitting: Cardiovascular Disease

## 2014-08-29 ENCOUNTER — Encounter: Payer: Self-pay | Admitting: Cardiovascular Disease

## 2014-08-29 VITALS — BP 110/62 | HR 84 | Ht 72.0 in | Wt 231.0 lb

## 2014-08-29 DIAGNOSIS — E785 Hyperlipidemia, unspecified: Secondary | ICD-10-CM | POA: Diagnosis not present

## 2014-08-29 DIAGNOSIS — I2510 Atherosclerotic heart disease of native coronary artery without angina pectoris: Secondary | ICD-10-CM

## 2014-08-29 DIAGNOSIS — I10 Essential (primary) hypertension: Secondary | ICD-10-CM | POA: Diagnosis not present

## 2014-08-29 DIAGNOSIS — I2584 Coronary atherosclerosis due to calcified coronary lesion: Secondary | ICD-10-CM

## 2014-08-29 MED ORDER — NITROGLYCERIN 0.4 MG SL SUBL: 0.4000 mg | SUBLINGUAL_TABLET | SUBLINGUAL | Status: DC | PRN

## 2014-08-29 NOTE — Assessment & Plan Note (Signed)
Blood pressure is well controlled on today's visit. No changes made to the medications. 

## 2014-08-29 NOTE — Assessment & Plan Note (Signed)
Cholesterol is at goal on the current lipid regimen. No changes to the medications were made.  

## 2014-08-29 NOTE — Patient Instructions (Signed)
You are doing well. No medication changes were made.  Please call us if you have new issues that need to be addressed before your next appt.  Your physician wants you to follow-up in: 12 months.  You will receive a reminder letter in the mail two months in advance. If you don't receive a letter, please call our office to schedule the follow-up appointment. 

## 2014-08-29 NOTE — Assessment & Plan Note (Signed)
Currently with no symptoms of angina. No further workup at this time. Continue current medication regimen. 

## 2014-08-29 NOTE — Progress Notes (Signed)
Patient ID: Keith Baty., male    DOB: Jun 03, 1955, 59 y.o.   MRN: [id number]  HPI Comments: Keith Gross is a very pleasant 59 year old gentleman, patient of Dr. Einar Pheasant and Edrick Oh, remote history of syncope in 2010 felt secondary to low blood pressure that improved with medication changes,  with chest pain symptoms at the beginning of  April 2013, stress test showing inferior lateral hypoperfusion consistent with large region of scar which was new from 2010, cardiac catheterization showing severe ramus disease, with DES stent placed in May 2013. Followup catheterization September 2013 for chest pain showing patent stent, distal left circumflex disease noted, moderate in nature. Medical management recommended. He presents today for follow-up of his coronary artery disease  In follow-up, he reports that he is doing well.  he is active, continues to work out 2 days with a Physiological scientist.  Does cardio and weightlifting Has periodic follow-up with psychiatry, overall feels less depressed and would like to slowly wean off some of his medications. Recent surgery for ED with implant down place. Overall has healed well, happy with his decision. He is back at work, denies any significant chest pain concerning for angina. Has good energy Is wearing his CPAP for  sleep apnea  EKG shows normal sinus rhythm with rate 84 bpm, no significant ST or T-wave changes  Other past medical history He does have a previous history of smoking, quit in 1992. Father had his first heart attack at age 58.  No Known Allergies  Current Outpatient Prescriptions on File Prior to Visit  Medication Sig Dispense Refill  . allopurinol (ZYLOPRIM) 100 MG tablet TAKE ONE TABLET TWICE DAILY 180 tablet 1  . amLODipine (NORVASC) 10 MG tablet TAKE ONE TABLET BY MOUTH EVERY DAY 90 tablet 3  . aspirin EC 81 MG tablet Take 81 mg by mouth daily.     Marland Kitchen atorvastatin (LIPITOR) 20 MG tablet TAKE ONE TABLET EVERY DAY 90 tablet  3  . cetirizine (ZYRTEC) 10 MG tablet Take 10 mg by mouth daily.    Marland Kitchen CINNAMON PO Take 1,000 mg by mouth 2 (two) times daily.     . clopidogrel (PLAVIX) 75 MG tablet TAKE ONE TABLET BY MOUTH EVERY DAY 90 tablet 3  . fluticasone (FLONASE) 50 MCG/ACT nasal spray Place 2 sprays into both nostrils daily. 48 g 3  . hydrochlorothiazide (HYDRODIURIL) 25 MG tablet TAKE ONE TABLET BY MOUTH AT BEDTIME 90 tablet 3  . Hydrocodone-Chlorpheniramine 5-4 MG/5ML SOLN Take by mouth as needed.     . lamoTRIgine (LAMICTAL) 200 MG tablet Take 200 mg by mouth daily.  0  . magnesium oxide (MAG-OX) 400 MG tablet Take 400 mg by mouth daily.    . metoprolol tartrate (LOPRESSOR) 25 MG tablet TAKE ONE-HALF (1/2) TABLET BY MOUTH EVERY DAY 45 tablet 3  . pantoprazole (PROTONIX) 40 MG tablet Take 1 tablet (40 mg total) by mouth daily. 90 tablet 1  . polyethylene glycol powder (GLYCOLAX/MIRALAX) powder Use as directed. 850 g 1  . potassium citrate (UROCIT-K) 10 MEQ (1080 MG) SR tablet TAKE ONE TABLET TWICE DAILY 180 each 1  . Psyllium (METAMUCIL PO) Take by mouth.    . tamsulosin (FLOMAX) 0.4 MG CAPS Take 0.4 mg by mouth daily.    . Testosterone (ANDROGEL PUMP) 20.25 MG/ACT (1.62%) GEL Place 1 application onto the skin daily.    . TRAZODONE HCL PO Take 200 mg by mouth at bedtime.    . valsartan-hydrochlorothiazide (DIOVAN-HCT) 320-25  MG per tablet TAKE ONE TABLET BY MOUTH EVERY DAY 90 tablet 3  . VITAMIN D, CHOLECALCIFEROL, PO Take 2,000 mg by mouth daily.    . vitamin E 1000 UNIT capsule Take 1,000 Units by mouth daily.     No current facility-administered medications on file prior to visit.    Past Medical History  Diagnosis Date  . GERD (gastroesophageal reflux disease)   . Hypertension   . Anxiety   . Gout     Multiple episodes  . Dysplastic polyp of colon 2008    [facility]  . Treadmill stress test negative for angina pectoris 2011  . Syncopal episodes   . Hyperlipidemia   . Sleep apnea     on CPAP  . Angina    . Myocardial infarction 05/2011    "we think";  06/2011 abnl mv;  06/2011 Cath - subtotal occlusion of Ramus, otw nonobs dzs;  06/2011 PCI/DES of Ramus w/ 2.25x58mm Resolute DES   . Kidney stones   . Diastasis of muscle 2011    "abdominal"    Past Surgical History  Procedure Laterality Date  . Hemorrhoid surgery  2000  . Inguinal hernia repair  1987    left  . Lithotripsy  10/2008  . Ureteroscopy  10/2009  . Cardiac catheterization  06/2011    "diagnostic"  . Coronary angioplasty with stent placement  07/09/11    "1"  . Upper gi endoscopy    . Colonoscopy    . Percutaneous coronary stent intervention (pci-s) N/A 07/09/2011    Procedure: PERCUTANEOUS CORONARY STENT INTERVENTION (PCI-S);  Surgeon: Sherren Mocha, MD;  Location: [facility] CATH LAB;  Service: Cardiovascular;  Laterality: N/A;  . Left heart catheterization with coronary angiogram N/A 10/21/2011    Procedure: LEFT HEART CATHETERIZATION WITH CORONARY ANGIOGRAM;  Surgeon: Hillary Bow, MD;  Location: [facility] CATH LAB;  Service: Cardiovascular;  Laterality: N/A;    Social History  reports that he quit smoking about 24 years ago. His smoking use included Cigarettes. He has a 10 pack-year smoking history. He has never used smokeless tobacco. He reports that he drinks about 2.4 oz of alcohol per week. He reports that he uses illicit drugs (Marijuana) about 7 times per week.  Family History family history includes Heart attack (age of onset: 58) in his father; Hypertension in his other.  Review of Systems  Constitutional: Negative.   Respiratory: Negative.   Cardiovascular: Negative.   Gastrointestinal: Negative.   Musculoskeletal: Negative.   Neurological: Negative.   Hematological: Negative.   Psychiatric/Behavioral: The patient is not nervous/anxious.   All other systems reviewed and are negative.   BP 110/62 mmHg  Pulse 84  Ht 6' (1.829 m)  Wt 231 lb (104.781 kg)  BMI 31.32 kg/m2  Physical Exam  Constitutional: He is  oriented to person, place, and time. He appears well-developed and well-nourished.  HENT:  Head: Normocephalic.  Nose: Nose normal.  Mouth/Throat: Oropharynx is clear and moist.  Eyes: Conjunctivae are normal. Pupils are equal, round, and reactive to light.  Neck: Normal range of motion. Neck supple. No JVD present.  Cardiovascular: Normal rate, regular rhythm, S1 normal, S2 normal, normal heart sounds and intact distal pulses.  Exam reveals no gallop and no friction rub.   No murmur heard. Pulmonary/Chest: Effort normal and breath sounds normal. No respiratory distress. He has no wheezes. He has no rales. He exhibits no tenderness.  Abdominal: Soft. Bowel sounds are normal. He exhibits no distension. There is no tenderness.  Musculoskeletal: Normal range of motion. He exhibits no edema or tenderness.  Lymphadenopathy:    He has no cervical adenopathy.  Neurological: He is alert and oriented to person, place, and time. Coordination normal.  Skin: Skin is warm and dry. No rash noted. No erythema.  Psychiatric: He has a normal mood and affect. His behavior is normal. Judgment and thought content normal.      Assessment and Plan   Nursing note and vitals reviewed.

## 2014-09-12 ENCOUNTER — Other Ambulatory Visit: Payer: Self-pay | Admitting: Internal Medicine

## 2014-09-12 ENCOUNTER — Other Ambulatory Visit: Payer: Self-pay | Admitting: Cardiovascular Disease

## 2014-09-14 ENCOUNTER — Other Ambulatory Visit: Payer: BLUE CROSS/BLUE SHIELD

## 2014-09-15 ENCOUNTER — Ambulatory Visit: Payer: BLUE CROSS/BLUE SHIELD | Admitting: Internal Medicine

## 2014-09-27 ENCOUNTER — Other Ambulatory Visit: Payer: Self-pay | Admitting: Cardiovascular Disease

## 2014-10-19 ENCOUNTER — Other Ambulatory Visit: Payer: Self-pay | Admitting: Internal Medicine

## 2014-10-19 ENCOUNTER — Encounter: Payer: Self-pay | Admitting: Internal Medicine

## 2014-10-19 NOTE — Telephone Encounter (Signed)
Please hold until we get the fax.  See my chart message.    Dr Nicki Reaper

## 2014-12-20 ENCOUNTER — Other Ambulatory Visit: Payer: Self-pay | Admitting: Internal Medicine

## 2014-12-20 NOTE — Telephone Encounter (Signed)
I ok'd refill for his allopurinol and urocit.  rx's sent in.  He needs a physical scheduled and is overdue fasting labs.  Please schedule.

## 2014-12-20 NOTE — Telephone Encounter (Signed)
Please advise refill as patient has cancelled her last appointment in July, last seen in April.

## 2015-01-02 ENCOUNTER — Encounter: Payer: Self-pay | Admitting: Internal Medicine

## 2015-01-02 NOTE — Telephone Encounter (Signed)
Pt prefers to change his appt to another day - not Friday.  See his my chart message.  Please schedule him at the end of 1/2 day (on a Wednesday)  Thanks

## 2015-01-08 ENCOUNTER — Other Ambulatory Visit: Payer: Self-pay | Admitting: Internal Medicine

## 2015-01-10 NOTE — Telephone Encounter (Signed)
Lm to give the office a call back about appt and labs. Please schedule when pt calls back

## 2015-03-09 ENCOUNTER — Ambulatory Visit: Payer: BLUE CROSS/BLUE SHIELD | Admitting: Internal Medicine

## 2015-03-12 ENCOUNTER — Other Ambulatory Visit: Payer: Self-pay | Admitting: *Deleted

## 2015-03-12 MED ORDER — AMLODIPINE BESYLATE 10 MG PO TABS: 10.0000 mg | ORAL_TABLET | Freq: Every day | ORAL | Status: DC

## 2015-03-13 ENCOUNTER — Other Ambulatory Visit: Payer: Self-pay | Admitting: Internal Medicine

## 2015-03-13 ENCOUNTER — Other Ambulatory Visit (INDEPENDENT_AMBULATORY_CARE_PROVIDER_SITE_OTHER): Payer: BLUE CROSS/BLUE SHIELD

## 2015-03-13 ENCOUNTER — Telehealth: Payer: Self-pay | Admitting: Internal Medicine

## 2015-03-13 DIAGNOSIS — R945 Abnormal results of liver function studies: Secondary | ICD-10-CM

## 2015-03-13 DIAGNOSIS — R739 Hyperglycemia, unspecified: Secondary | ICD-10-CM | POA: Diagnosis not present

## 2015-03-13 DIAGNOSIS — I10 Essential (primary) hypertension: Secondary | ICD-10-CM | POA: Diagnosis not present

## 2015-03-13 DIAGNOSIS — K7689 Other specified diseases of liver: Secondary | ICD-10-CM

## 2015-03-13 DIAGNOSIS — E785 Hyperlipidemia, unspecified: Secondary | ICD-10-CM | POA: Diagnosis not present

## 2015-03-13 DIAGNOSIS — F419 Anxiety disorder, unspecified: Secondary | ICD-10-CM | POA: Diagnosis not present

## 2015-03-13 LAB — CBC WITH DIFFERENTIAL/PLATELET
BASOS PCT: 0.5 % (ref 0.0–3.0)
Basophils Absolute: 0 10*3/uL (ref 0.0–0.1)
EOS PCT: 1.5 % (ref 0.0–5.0)
Eosinophils Absolute: 0.1 10*3/uL (ref 0.0–0.7)
HCT: 47.1 % (ref 39.0–52.0)
HEMOGLOBIN: 15.8 g/dL (ref 13.0–17.0)
Lymphocytes Relative: 19.3 % (ref 12.0–46.0)
Lymphs Abs: 1.1 10*3/uL (ref 0.7–4.0)
MCHC: 33.5 g/dL (ref 30.0–36.0)
MCV: 93.6 fl (ref 78.0–100.0)
MONO ABS: 0.5 10*3/uL (ref 0.1–1.0)
MONOS PCT: 8.3 % (ref 3.0–12.0)
Neutro Abs: 4 10*3/uL (ref 1.4–7.7)
Neutrophils Relative %: 70.4 % (ref 43.0–77.0)
Platelets: 200 10*3/uL (ref 150.0–400.0)
RBC: 5.03 Mil/uL (ref 4.22–5.81)
RDW: 14.2 % (ref 11.5–15.5)
WBC: 5.7 10*3/uL (ref 4.0–10.5)

## 2015-03-13 LAB — LIPID PANEL
CHOL/HDL RATIO: 4
CHOLESTEROL: 112 mg/dL (ref 0–200)
HDL: 28 mg/dL — ABNORMAL LOW (ref >39.00)
LDL CALC: 54 mg/dL (ref 0–99)
NONHDL: 83.82
Triglycerides: 147 mg/dL (ref 0.0–149.0)
VLDL: 29.4 mg/dL (ref 0.0–40.0)

## 2015-03-13 LAB — BASIC METABOLIC PANEL
BUN: 15 mg/dL (ref 6–23)
CHLORIDE: 102 meq/L (ref 96–112)
CO2: 31 mEq/L (ref 19–32)
CREATININE: 1.12 mg/dL (ref 0.40–1.50)
Calcium: 9.7 mg/dL (ref 8.4–10.5)
GFR: 71.42 mL/min (ref >60.00)
GLUCOSE: 138 mg/dL — AB (ref 70–99)
Potassium: 4 mEq/L (ref 3.5–5.1)
SODIUM: 141 meq/L (ref 135–145)

## 2015-03-13 LAB — HEPATIC FUNCTION PANEL
ALBUMIN: 4.6 g/dL (ref 3.5–5.2)
ALT: 28 U/L (ref 0–53)
AST: 26 U/L (ref 0–37)
Alkaline Phosphatase: 42 U/L (ref 39–117)
BILIRUBIN TOTAL: 0.7 mg/dL (ref 0.2–1.2)
Bilirubin, Direct: 0.1 mg/dL (ref 0.0–0.3)
Total Protein: 7.1 g/dL (ref 6.0–8.3)

## 2015-03-13 LAB — TSH: TSH: 2.45 u[IU]/mL (ref 0.35–4.50)

## 2015-03-13 LAB — HEMOGLOBIN A1C: HEMOGLOBIN A1C: 6 % (ref 4.6–6.5)

## 2015-03-13 MED ORDER — FLUTICASONE PROPIONATE 50 MCG/ACT NA SUSP: 2.0000 | Freq: Every day | NASAL | Status: DC

## 2015-03-13 MED ORDER — POTASSIUM CITRATE ER 10 MEQ (1080 MG) PO TBCR: 10.0000 meq | EXTENDED_RELEASE_TABLET | Freq: Two times a day (BID) | ORAL | Status: DC

## 2015-03-13 MED ORDER — ALLOPURINOL 100 MG PO TABS: 100.0000 mg | ORAL_TABLET | Freq: Two times a day (BID) | ORAL | Status: DC

## 2015-03-13 NOTE — Telephone Encounter (Signed)
Pt had labs drawn today.  ok'd refill x 1

## 2015-03-13 NOTE — Telephone Encounter (Signed)
Pt wife stated that he get a 90 day supply and it's cheaper. Wife number is [phone].  Thank you!

## 2015-03-13 NOTE — Telephone Encounter (Signed)
Patient as not been seen since April. Please advise?

## 2015-03-13 NOTE — Addendum Note (Signed)
Addended by: Alisa Graff on: 03/13/2015 02:28 PM   Modules accepted: Orders

## 2015-03-13 NOTE — Telephone Encounter (Signed)
Patient was calldd and was not at work. A MyChart was sent letting him know a 30-day supply would be sent, but to get anymore refills he will need a appointment.

## 2015-03-13 NOTE — Telephone Encounter (Signed)
Pt has an appt on 03/19/2015.

## 2015-03-13 NOTE — Addendum Note (Signed)
Addended by: Carmin Muskrat on: 03/13/2015 10:34 AM   Modules accepted: Orders

## 2015-03-14 ENCOUNTER — Encounter: Payer: Self-pay | Admitting: Internal Medicine

## 2015-03-16 NOTE — Telephone Encounter (Deleted)
Unread mychart message mailed to patient 

## 2015-03-16 NOTE — Telephone Encounter (Signed)
Unread mychart message mailed to patient 

## 2015-03-19 ENCOUNTER — Encounter: Payer: Self-pay | Admitting: Internal Medicine

## 2015-03-19 ENCOUNTER — Ambulatory Visit (INDEPENDENT_AMBULATORY_CARE_PROVIDER_SITE_OTHER): Payer: BLUE CROSS/BLUE SHIELD | Admitting: Internal Medicine

## 2015-03-19 VITALS — BP 134/82 | HR 82 | Temp 98.6°F | Ht 72.0 in | Wt 231.0 lb

## 2015-03-19 DIAGNOSIS — I10 Essential (primary) hypertension: Secondary | ICD-10-CM

## 2015-03-19 DIAGNOSIS — K2100 Gastro-esophageal reflux disease with esophagitis, without bleeding: Secondary | ICD-10-CM

## 2015-03-19 DIAGNOSIS — Z1159 Encounter for screening for other viral diseases: Secondary | ICD-10-CM

## 2015-03-19 DIAGNOSIS — R739 Hyperglycemia, unspecified: Secondary | ICD-10-CM

## 2015-03-19 DIAGNOSIS — I2510 Atherosclerotic heart disease of native coronary artery without angina pectoris: Secondary | ICD-10-CM

## 2015-03-19 DIAGNOSIS — F419 Anxiety disorder, unspecified: Secondary | ICD-10-CM

## 2015-03-19 DIAGNOSIS — R945 Abnormal results of liver function studies: Secondary | ICD-10-CM

## 2015-03-19 DIAGNOSIS — K7689 Other specified diseases of liver: Secondary | ICD-10-CM

## 2015-03-19 DIAGNOSIS — N200 Calculus of kidney: Secondary | ICD-10-CM | POA: Diagnosis not present

## 2015-03-19 DIAGNOSIS — G4730 Sleep apnea, unspecified: Secondary | ICD-10-CM

## 2015-03-19 DIAGNOSIS — Z860100 Personal history of colon polyps, unspecified: Secondary | ICD-10-CM

## 2015-03-19 DIAGNOSIS — E785 Hyperlipidemia, unspecified: Secondary | ICD-10-CM

## 2015-03-19 DIAGNOSIS — I2584 Coronary atherosclerosis due to calcified coronary lesion: Secondary | ICD-10-CM

## 2015-03-19 DIAGNOSIS — Z0000 Encounter for general adult medical examination without abnormal findings: Secondary | ICD-10-CM

## 2015-03-19 NOTE — Assessment & Plan Note (Signed)
Recent liver panel wnl.  

## 2015-03-19 NOTE — Assessment & Plan Note (Signed)
Being followed by Dr Jacqlyn Larsen.  Planning for bladder scan and KUB next visit - in 6 months.  Currently asymptomatic.  Utilize citrate containing products.

## 2015-03-19 NOTE — Assessment & Plan Note (Signed)
Low cholesterol diet and exercise.  On lipitor.  Follow lipid panel and liver function tests.   

## 2015-03-19 NOTE — Assessment & Plan Note (Signed)
On CPAP.  Uses regularly.

## 2015-03-19 NOTE — Assessment & Plan Note (Signed)
Sees Dr Nicolasa Ducking.  Recently increased his effexor.  Doing well on current regimen.

## 2015-03-19 NOTE — Assessment & Plan Note (Signed)
No cardiac symptoms.  Continue risk factor modification.

## 2015-03-19 NOTE — Assessment & Plan Note (Signed)
Blood pressure under good control.  Continue same medication regimen.  Follow pressures.  Follow metabolic panel.   

## 2015-03-19 NOTE — Assessment & Plan Note (Addendum)
Low carb diet and exercise.  Follow met b and a1c.  We discussed recent a1c - 6.0.  Follow.

## 2015-03-19 NOTE — Assessment & Plan Note (Signed)
EGD 08/23/13 as outlined in overview.  Currently asymptomatic on protonix.  Follow.

## 2015-03-19 NOTE — Assessment & Plan Note (Signed)
Colonoscopy 08/23/13.  One polyp.  Recommended f/u in 5 years.

## 2015-03-19 NOTE — Assessment & Plan Note (Signed)
Physical today 03/19/15.  Prostate and psa followed by Dr Jacqlyn Larsen.  Colonoscopy 08/2013.

## 2015-03-19 NOTE — Progress Notes (Signed)
Patient ID: Keith Gross., male   DOB: [birth year], 60 y.o.   MRN: [id number]   Subjective:    Patient ID: Keith Gross., male    DOB: 03-24-1955, 60 y.o.   MRN: [id number]  HPI  Patient with past history of hypercholesterolemia, GERD, anxiety, sleep apnea and hypertension.  He comes in today to follow up on these issues.  Was scheduled for a physical.  Sees Dr Jacqlyn Larsen for his prostate issues.  Just recently evaluated.  No changes made.  He follows psa and prostate.  He had been out of his routine with exercise for the last several months.  Also, has not been watching his diet as well.  Is back in the gym now.  Back to watching his diet.  We discussed his recent a1c - 6.0.  No chest pain or tightness.  No sob.  No acid reflux reported.  No abdominal pain or cramping.  Bowels stable.  Handling stress well.  Stress is better.  Dr Nicolasa Ducking recently increased his effexor back up to '150mg'$ .  Doing well with this.     Past Medical History  Diagnosis Date  . GERD (gastroesophageal reflux disease)   . Hypertension   . Anxiety   . Gout     Multiple episodes  . Dysplastic polyp of colon 2008    [facility]  . Treadmill stress test negative for angina pectoris 2011  . Syncopal episodes   . Hyperlipidemia   . Sleep apnea     on CPAP  . Angina   . Myocardial infarction [facility]) 05/2011    "we think";  06/2011 abnl mv;  06/2011 Cath - subtotal occlusion of Ramus, otw nonobs dzs;  06/2011 PCI/DES of Ramus w/ 2.25x78m Resolute DES   . Kidney stones   . Diastasis of muscle 2011    "abdominal"   Past Surgical History  Procedure Laterality Date  . Hemorrhoid surgery  2000  . Inguinal hernia repair  1987    left  . Lithotripsy  10/2008  . Ureteroscopy  10/2009  . Cardiac catheterization  06/2011    "diagnostic"  . Coronary angioplasty with stent placement  07/09/11    "1"  . Upper gi endoscopy    . Colonoscopy    . Percutaneous coronary stent intervention (pci-s) N/A 07/09/2011    Procedure: PERCUTANEOUS  CORONARY STENT INTERVENTION (PCI-S);  Surgeon: MSherren Mocha MD;  [facility];  Service: Cardiovascular;  Laterality: N/A;  . Left heart catheterization with coronary angiogram N/A 10/21/2011    Procedure: LEFT HEART CATHETERIZATION WITH CORONARY ANGIOGRAM;  Surgeon: THillary Bow MD;  [facility];  Service: Cardiovascular;  Laterality: N/A;   Family History  Problem Relation Age of Onset  . Heart attack Father 469   LVAD  . Hypertension Other    Social History   Social History  . Marital Status: Single    Spouse Name: N/A  . Number of Children: 4  . Years of Education: N/A   Occupational History  . Health insurance broker    Social History Main Topics  . Smoking status: Former Smoker -- 0.50 packs/day for 20 years    Types: Cigarettes    Quit date: 02/17/1990  . Smokeless tobacco: Never Used  . Alcohol Use: 2.4 oz/week    2 Glasses of wine, 2 Cans of beer per week  . Drug Use: 7.00 per week    Special: Marijuana     Comment: 07/09/11 "lets say  a cigarette a day"  . Sexual Activity: Yes   Other Topics Concern  . None   Social History Narrative   Married and has 4 children.      Outpatient Encounter Prescriptions as of 03/19/2015  Medication Sig  . allopurinol (ZYLOPRIM) 100 MG tablet TAKE ONE TABLET TWICE DAILY  . allopurinol (ZYLOPRIM) 100 MG tablet Take 1 tablet (100 mg total) by mouth 2 (two) times daily.  Marland Kitchen amLODipine (NORVASC) 10 MG tablet Take 1 tablet (10 mg total) by mouth daily.  Marland Kitchen aspirin EC 81 MG tablet Take 81 mg by mouth daily.   Marland Kitchen atorvastatin (LIPITOR) 20 MG tablet TAKE ONE TABLET EVERY DAY  . cetirizine (ZYRTEC) 10 MG tablet Take 10 mg by mouth daily.  Marland Kitchen CINNAMON PO Take 1,000 mg by mouth 2 (two) times daily.   . clopidogrel (PLAVIX) 75 MG tablet TAKE ONE TABLET BY MOUTH EVERY DAY  . fluticasone (FLONASE) 50 MCG/ACT nasal spray Place 2 sprays into both nostrils daily.  . hydrochlorothiazide (HYDRODIURIL) 25 MG tablet TAKE ONE  TABLET BY MOUTH AT BEDTIME  . Hydrocodone-Chlorpheniramine 5-4 MG/5ML SOLN Take by mouth as needed.   . lamoTRIgine (LAMICTAL) 200 MG tablet Take 200 mg by mouth daily.  . magnesium oxide (MAG-OX) 400 MG tablet Take 400 mg by mouth daily.  . metoprolol tartrate (LOPRESSOR) 25 MG tablet ONE-HALF TABLET BY MOUTH EVERY DAY  . nitroGLYCERIN (NITROSTAT) 0.4 MG SL tablet Place 1 tablet (0.4 mg total) under the tongue every 5 (five) minutes as needed for chest pain.  . pantoprazole (PROTONIX) 40 MG tablet TAKE ONE TABLET BY MOUTH EVERY DAY  . polyethylene glycol powder (GLYCOLAX/MIRALAX) powder Use as directed.  . potassium citrate (UROCIT-K) 10 MEQ (1080 MG) SR tablet Take 1 tablet (10 mEq total) by mouth 2 (two) times daily.  . Psyllium (METAMUCIL PO) Take by mouth.  . tamsulosin (FLOMAX) 0.4 MG CAPS Take 0.4 mg by mouth daily.  . Testosterone (ANDROGEL PUMP) 20.25 MG/ACT (1.62%) GEL Place 1 application onto the skin daily.  . TRAZODONE HCL PO Take 200 mg by mouth at bedtime.  . valsartan-hydrochlorothiazide (DIOVAN-HCT) 320-25 MG per tablet TAKE ONE TABLET BY MOUTH EVERY DAY  . venlafaxine XR (EFFEXOR-XR) 75 MG 24 hr capsule Take 150 mg by mouth daily with breakfast.   . VITAMIN D, CHOLECALCIFEROL, PO Take 2,000 mg by mouth daily.  . vitamin E 1000 UNIT capsule Take 1,000 Units by mouth daily.   No facility-administered encounter medications on file as of 03/19/2015.    Review of Systems  Constitutional: Negative for appetite change and unexpected weight change.  HENT: Negative for congestion and sinus pressure.   Eyes: Negative for pain and visual disturbance.  Respiratory: Negative for cough, chest tightness and shortness of breath.   Cardiovascular: Negative for chest pain, palpitations and leg swelling.  Gastrointestinal: Negative for nausea, vomiting, abdominal pain and diarrhea.  Genitourinary: Negative for dysuria and difficulty urinating.  Musculoskeletal: Negative for back pain and  joint swelling.  Skin: Negative for color change and rash.  Neurological: Negative for dizziness, light-headedness and headaches.  Hematological: Negative for adenopathy. Does not bruise/bleed easily.  Psychiatric/Behavioral: Negative for dysphoric mood and agitation.       Stress is better.        Objective:     Blood pressure rechecked by me:  120/78  Physical Exam  Constitutional: He is oriented to person, place, and time. He appears well-developed and well-nourished. No distress.  HENT:  Head:  Normocephalic and atraumatic.  Nose: Nose normal.  Mouth/Throat: Oropharynx is clear and moist. No oropharyngeal exudate.  Eyes: Conjunctivae are normal. Right eye exhibits no discharge. Left eye exhibits no discharge.  Neck: Neck supple. No thyromegaly present.  Cardiovascular: Normal rate and regular rhythm.   Pulmonary/Chest: Breath sounds normal. No respiratory distress. He has no wheezes.  Abdominal: Soft. Bowel sounds are normal. There is no tenderness.  Musculoskeletal: He exhibits no edema or tenderness.  Lymphadenopathy:    He has no cervical adenopathy.  Neurological: He is alert and oriented to person, place, and time.  Skin: Skin is warm and dry. No rash noted. No erythema.  Psychiatric: He has a normal mood and affect. His behavior is normal.    BP 134/82 mmHg  Pulse 82  Temp(Src) 98.6 F (37 C) (Oral)  Ht 6' (1.829 m)  Wt 231 lb (104.781 kg)  BMI 31.32 kg/m2  SpO2 95% Wt Readings from Last 3 Encounters:  03/19/15 231 lb (104.781 kg)  08/29/14 231 lb (104.781 kg)  05/26/14 229 lb (103.874 kg)     Lab Results  Component Value Date   WBC 5.7 03/13/2015   HGB 15.8 03/13/2015   HCT 47.1 03/13/2015   PLT 200.0 03/13/2015   GLUCOSE 138* 03/13/2015   CHOL 112 03/13/2015   TRIG 147.0 03/13/2015   HDL 28.00* 03/13/2015   LDLCALC 54 03/13/2015   ALT 28 03/13/2015   AST 26 03/13/2015   NA 141 03/13/2015   K 4.0 03/13/2015   CL 102 03/13/2015   CREATININE 1.12  03/13/2015   BUN 15 03/13/2015   CO2 31 03/13/2015   TSH 2.45 03/13/2015   INR 1.11 10/19/2011   HGBA1C 6.0 03/13/2015       Assessment & Plan:   Problem List Items Addressed This Visit    Abnormal liver function    Recent liver panel wnl.       Anxiety    Sees Dr Nicolasa Ducking.  Recently increased his effexor.  Doing well on current regimen.        CAD (coronary artery disease)    No cardiac symptoms.  Continue risk factor modification.        GERD (gastroesophageal reflux disease)    EGD 08/23/13 as outlined in overview.  Currently asymptomatic on protonix.  Follow.        Health care maintenance    Physical today 03/19/15.  Prostate and psa followed by Dr Jacqlyn Larsen.  Colonoscopy 08/2013.        History of colonic polyps    Colonoscopy 08/23/13.  One polyp.  Recommended f/u in 5 years.        Hyperglycemia    Low carb diet and exercise.  Follow met b and a1c.  We discussed recent a1c - 6.0.  Follow.        Relevant Orders   Hemoglobin A1c   Hyperlipidemia    Low cholesterol diet and exercise.  On lipitor.  Follow lipid panel and liver function tests.        Relevant Orders   Lipid panel   Hepatic function panel   Hypertension - Primary    Blood pressure under good control.  Continue same medication regimen.  Follow pressures.  Follow metabolic panel.        Relevant Orders   Basic metabolic panel   Nephrolithiasis    Being followed by Dr Jacqlyn Larsen.  Planning for bladder scan and KUB next visit - in 6 months.  Currently asymptomatic.  Utilize citrate containing products.        Sleep apnea    On CPAP.  Uses regularly.         Other Visit Diagnoses    Need for hepatitis C screening test        Relevant Orders    Hepatitis C antibody        Einar Pheasant, MD

## 2015-03-26 IMAGING — US ABDOMEN ULTRASOUND LIMITED
1 series · 13 of 25 positions shown · non-contrast
Comparison: none

REASON FOR EXAM: complete  abnormal liver function test
COMMENTS:

[Series 1: abdomen ultrasound limited · 0.30mm/px · 13 of 81 slices shown]
[im 1/81]
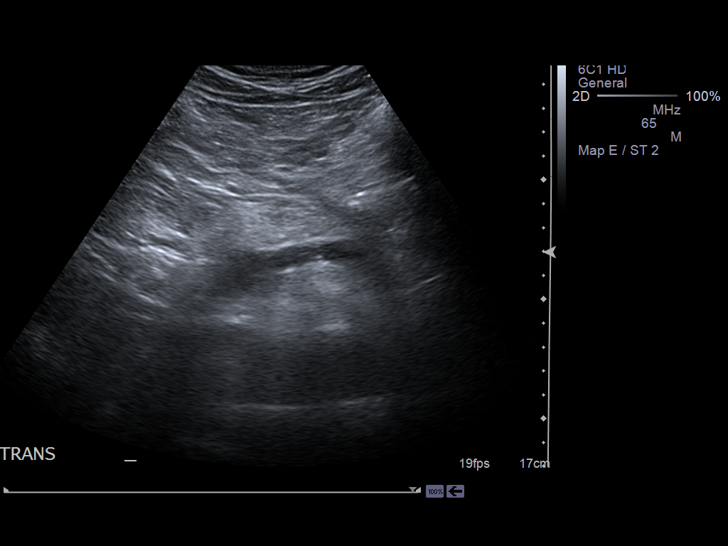
[im 7/81]
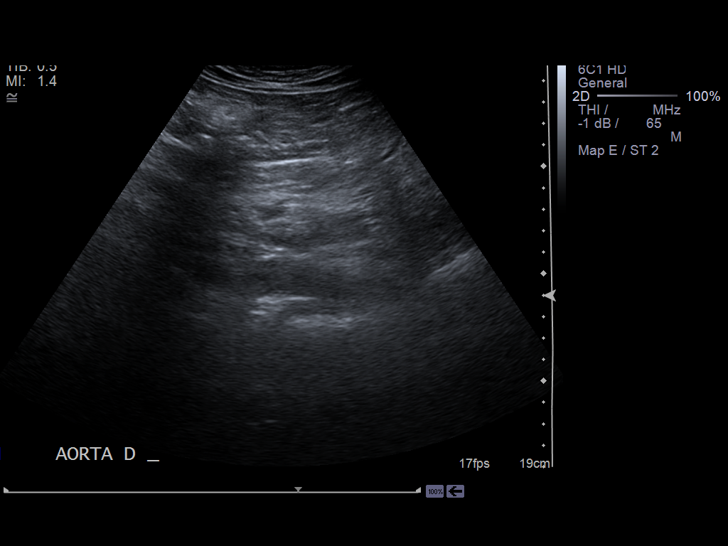
[im 14/81]
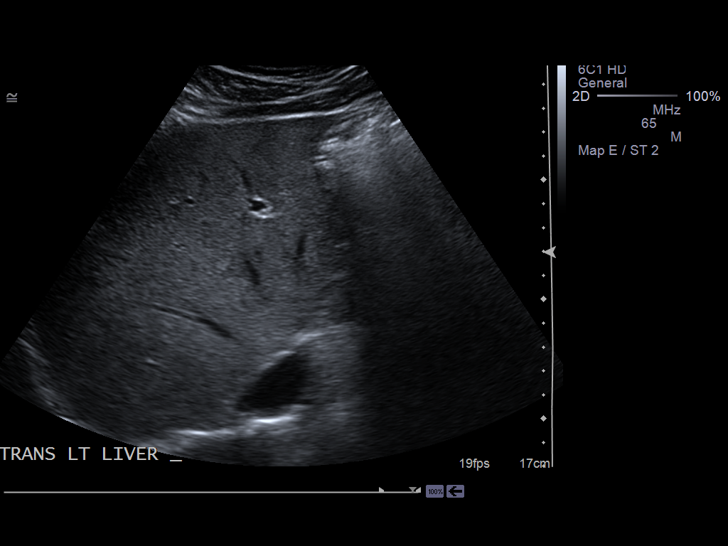
[im 21/81]
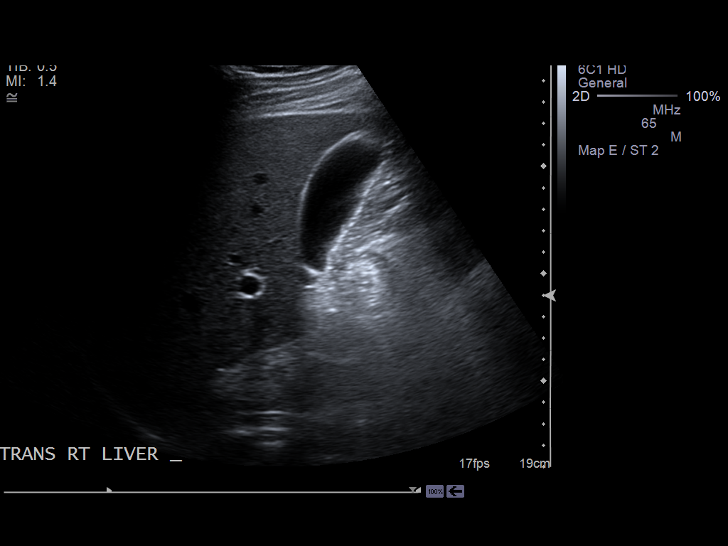
[im 27/81]
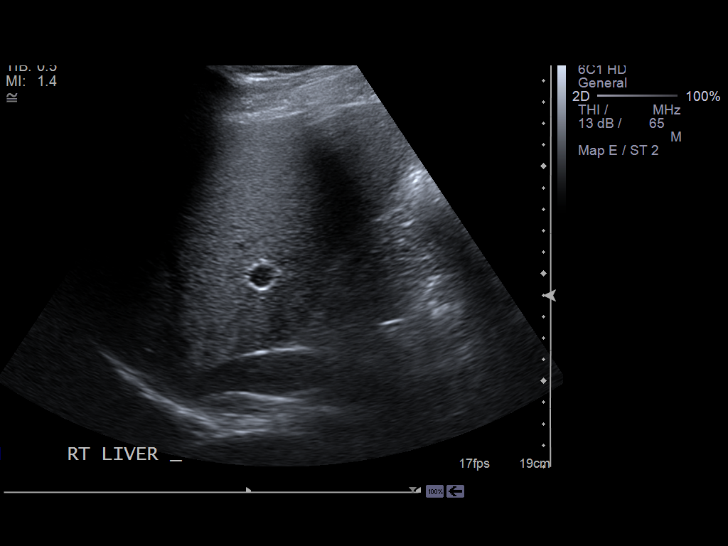
[im 34/81]
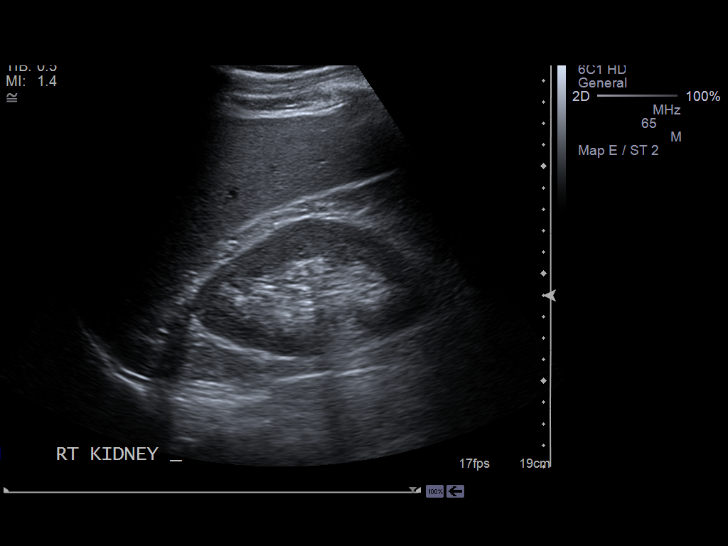
[im 41/81]
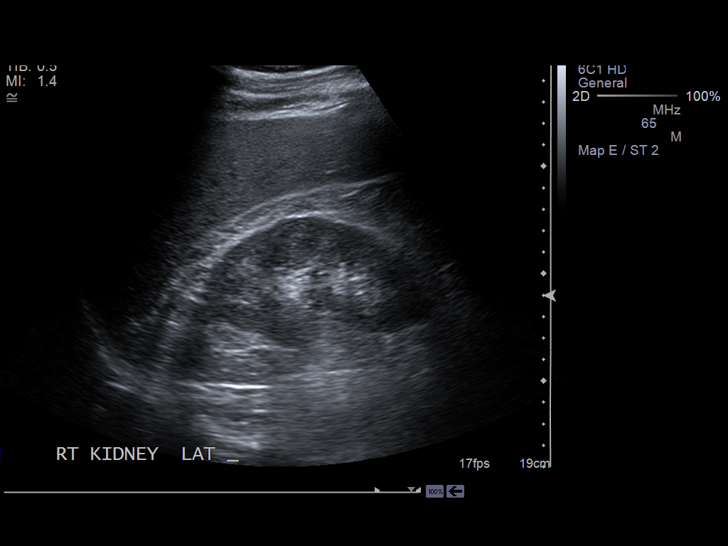
[im 47/81]
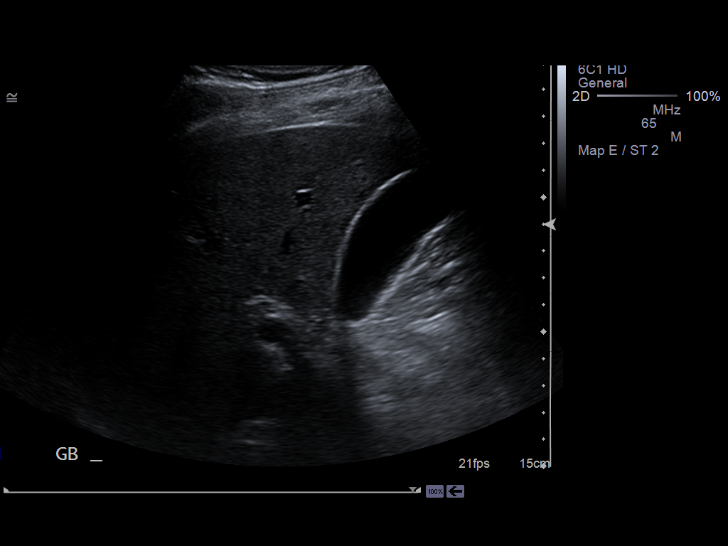
[im 54/81]
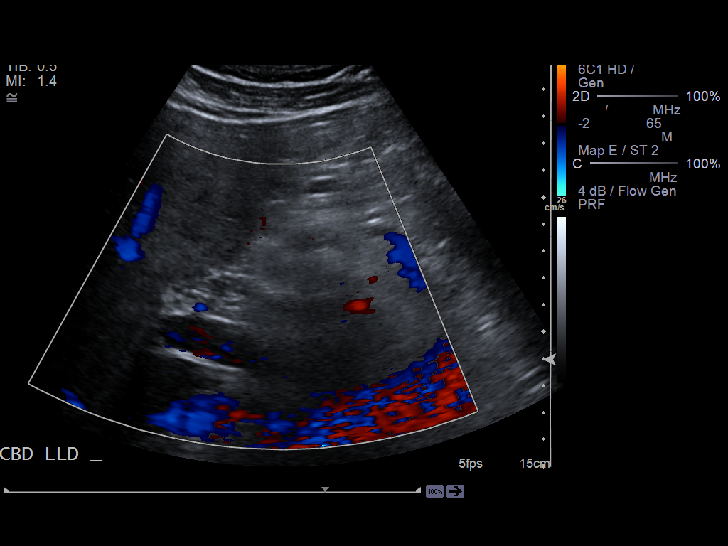
[im 61/81]
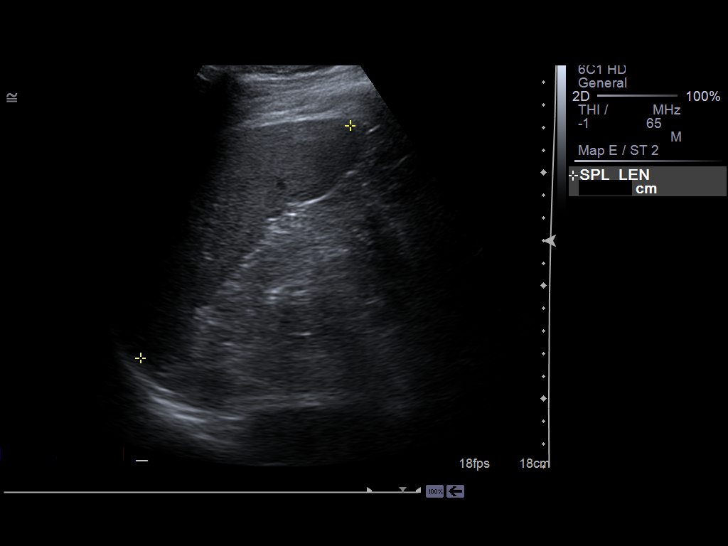
[im 67/81]
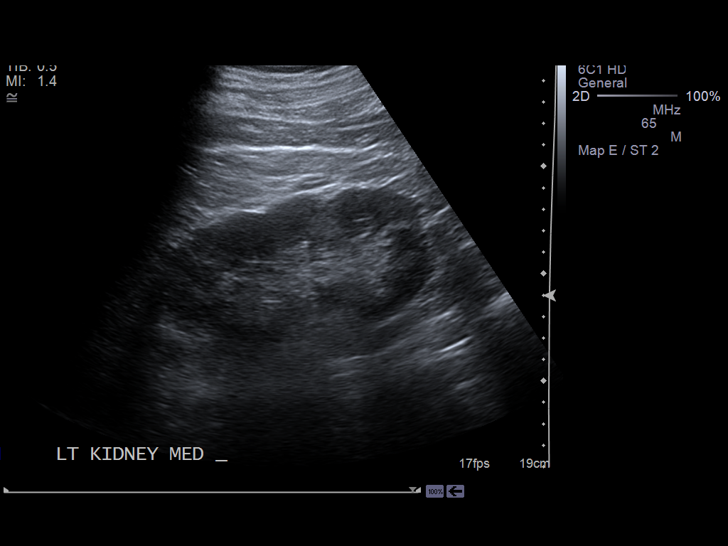
[im 74/81]
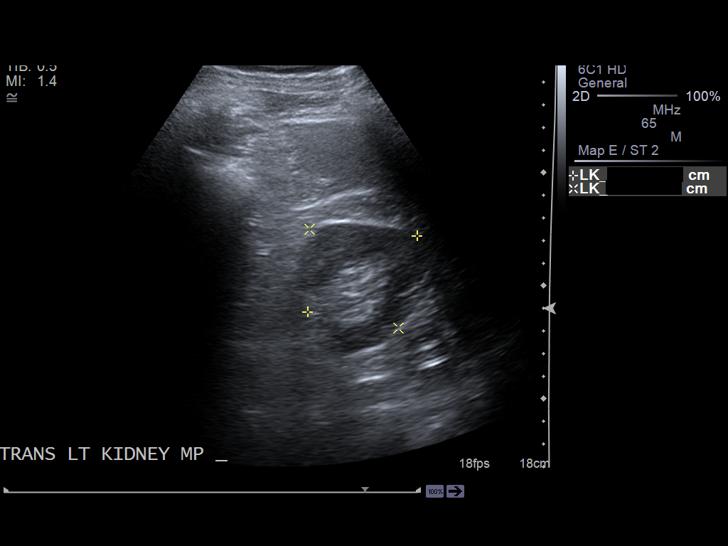
[im 81/81]
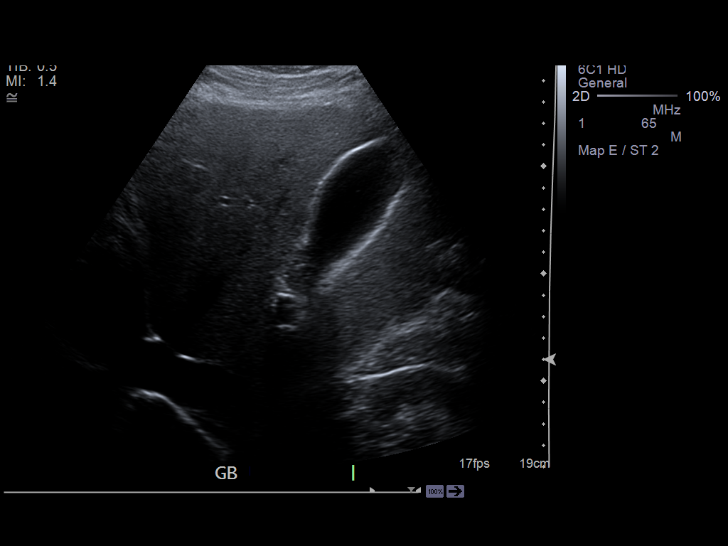

[13 of 25 positions shown; findings below may reference images not displayed]

PROCEDURE:     JENNINE - JENNINE ABDOMEN UPPER GENERAL  - June 29, 2012  [DATE]

RESULT:     There is poor visualization of the pancreas. There is limited
visualization of the aorta because of overlying bowel gas. No definite
aneurysm is identified. The liver length is 17.81 cm. The hepatic
echotexture is mildly increased. The common bile duct diameter is 2.4 mm.
Portal venous flow is normal. The right kidney measures 11.0 x 6.57 x
cm. There appear to be some linear areas of echogenicity within the right
renal collecting system which could represent a nonobstructing stones.
Gallbladder wall thickness is 2.4 mm. There is a negative sonographic
Murphy's sign reported. No gallstones are evident. A length of the spleen is
borderline to mildly enlarged at 13.85 cm. The proximal inferior vena cava
is patent. The left kidney measures 13.29 x 5.90 x 5.88 cm with possible
small 3.5 mm nonobstructing mid pole stone.
IMPRESSION: 1. Mild increased echogenicity of the liver.
2. Borderline to mild splenomegaly.
3. Cannot exclude bilateral nonobstructing renal stones. Poor visualization
of the pancreas.

[REDACTED]

## 2015-04-24 ENCOUNTER — Other Ambulatory Visit: Payer: Self-pay | Admitting: Cardiovascular Disease

## 2015-04-24 ENCOUNTER — Other Ambulatory Visit: Payer: Self-pay | Admitting: Internal Medicine

## 2015-04-24 NOTE — Telephone Encounter (Signed)
Requested Prescriptions   Pending Prescriptions Disp Refills  . atorvastatin (LIPITOR) 20 MG tablet [Pharmacy Med Name: ATORVASTATIN CALCIUM 20MG  TAB] 90 tablet 3    Sig: TAKE ONE TABLET BY MOUTH EVERY DAY

## 2015-04-28 ENCOUNTER — Encounter: Payer: Self-pay | Admitting: Internal Medicine

## 2015-04-30 MED ORDER — POTASSIUM CITRATE ER 10 MEQ (1080 MG) PO TBCR: 10.0000 meq | EXTENDED_RELEASE_TABLET | Freq: Two times a day (BID) | ORAL | Status: DC

## 2015-04-30 MED ORDER — PANTOPRAZOLE SODIUM 40 MG PO TBEC: 40.0000 mg | DELAYED_RELEASE_TABLET | Freq: Every day | ORAL | Status: DC

## 2015-04-30 MED ORDER — ALLOPURINOL 100 MG PO TABS: 100.0000 mg | ORAL_TABLET | Freq: Two times a day (BID) | ORAL | Status: DC

## 2015-04-30 MED ORDER — FLUTICASONE PROPIONATE 50 MCG/ACT NA SUSP: 2.0000 | Freq: Every day | NASAL | Status: DC

## 2015-04-30 NOTE — Telephone Encounter (Signed)
rx sent in for flonase, urocit, protonix and flonase 39months

## 2015-05-25 ENCOUNTER — Encounter: Payer: Self-pay | Admitting: Internal Medicine

## 2015-05-28 ENCOUNTER — Other Ambulatory Visit: Payer: Self-pay

## 2015-05-28 MED ORDER — ALLOPURINOL 100 MG PO TABS: 100.0000 mg | ORAL_TABLET | Freq: Two times a day (BID) | ORAL | Status: DC

## 2015-05-28 MED ORDER — FLUTICASONE PROPIONATE 50 MCG/ACT NA SUSP: 2.0000 | Freq: Every day | NASAL | Status: DC

## 2015-06-04 ENCOUNTER — Other Ambulatory Visit: Payer: Self-pay | Admitting: Cardiovascular Disease

## 2015-06-08 ENCOUNTER — Encounter: Payer: Self-pay | Admitting: Cardiovascular Disease

## 2015-06-26 ENCOUNTER — Other Ambulatory Visit: Payer: Self-pay | Admitting: Internal Medicine

## 2015-06-26 ENCOUNTER — Encounter: Payer: Self-pay | Admitting: Internal Medicine

## 2015-06-28 ENCOUNTER — Other Ambulatory Visit: Payer: Self-pay

## 2015-06-28 MED ORDER — POLYETHYLENE GLYCOL 3350 17 GM/SCOOP PO POWD
ORAL | Status: DC
Start: 2015-06-28 — End: 2016-07-07

## 2015-06-28 MED ORDER — ALLOPURINOL 100 MG PO TABS: 100.0000 mg | ORAL_TABLET | Freq: Two times a day (BID) | ORAL | Status: DC

## 2015-07-10 ENCOUNTER — Other Ambulatory Visit: Payer: Self-pay | Admitting: Internal Medicine

## 2015-07-10 ENCOUNTER — Other Ambulatory Visit: Payer: Self-pay | Admitting: Cardiovascular Disease

## 2015-08-01 ENCOUNTER — Other Ambulatory Visit: Payer: Self-pay | Admitting: *Deleted

## 2015-08-01 ENCOUNTER — Other Ambulatory Visit: Payer: Self-pay | Admitting: Internal Medicine

## 2015-08-01 MED ORDER — CLOPIDOGREL BISULFATE 75 MG PO TABS: 75.0000 mg | ORAL_TABLET | Freq: Every day | ORAL | Status: DC

## 2015-08-01 NOTE — Telephone Encounter (Signed)
Requested Prescriptions   Signed Prescriptions Disp Refills  . clopidogrel (PLAVIX) 75 MG tablet 90 tablet 3    Sig: Take 1 tablet (75 mg total) by mouth daily.    Authorizing Provider: GOLLAN, TIMOTHY J    Ordering User: LOPEZ, MARINA C    

## 2015-08-19 DIAGNOSIS — G4733 Obstructive sleep apnea (adult) (pediatric): Secondary | ICD-10-CM | POA: Diagnosis not present

## 2015-08-20 ENCOUNTER — Ambulatory Visit: Payer: BLUE CROSS/BLUE SHIELD | Admitting: Cardiovascular Disease

## 2015-09-03 ENCOUNTER — Encounter: Payer: Self-pay | Admitting: Cardiovascular Disease

## 2015-09-03 ENCOUNTER — Ambulatory Visit (INDEPENDENT_AMBULATORY_CARE_PROVIDER_SITE_OTHER): Payer: BLUE CROSS/BLUE SHIELD | Admitting: Cardiovascular Disease

## 2015-09-03 VITALS — BP 146/82 | HR 73 | Ht 72.0 in | Wt 229.5 lb

## 2015-09-03 DIAGNOSIS — I10 Essential (primary) hypertension: Secondary | ICD-10-CM

## 2015-09-03 DIAGNOSIS — I2584 Coronary atherosclerosis due to calcified coronary lesion: Secondary | ICD-10-CM | POA: Diagnosis not present

## 2015-09-03 DIAGNOSIS — I200 Unstable angina: Secondary | ICD-10-CM

## 2015-09-03 DIAGNOSIS — F419 Anxiety disorder, unspecified: Secondary | ICD-10-CM

## 2015-09-03 DIAGNOSIS — I2510 Atherosclerotic heart disease of native coronary artery without angina pectoris: Secondary | ICD-10-CM

## 2015-09-03 DIAGNOSIS — E785 Hyperlipidemia, unspecified: Secondary | ICD-10-CM

## 2015-09-03 DIAGNOSIS — R0789 Other chest pain: Secondary | ICD-10-CM

## 2015-09-03 NOTE — Patient Instructions (Signed)
Call if you have more frequent chest pain, lasting longer, needing more NTG   Medication Instructions:   No changes  Labwork:  No changes  Testing/Procedures:  No changes  Follow-Up: It was a pleasure seeing you in the office today. Please call us if you have new issues that need to be addressed before your next appt.  [phone]  Your physician wants you to follow-up in: 12 months.  You will receive a reminder letter in the mail two months in advance. If you don't receive a letter, please call our office to schedule the follow-up appointment.  If you need a refill on your cardiac medications before your next appointment, please call your pharmacy.

## 2015-09-03 NOTE — Progress Notes (Signed)
Patient ID: Keith Gross., male   DOB: [birth year], 60 y.o.   MRN: [id number] Cardiology Office Note  Date:  09/03/2015   ID:  Keith Gross., DOB Feb 03, 1956, MRN [id number]  PCP:  Einar Pheasant, MD   Chief Complaint  Patient presents with  . other    1 yr f/u c/o chest pain. Meds reviewed verbally with pt.    HPI:  [name] is a very pleasant 60 year old gentleman, patient of Dr. Einar Pheasant and Edrick Oh, remote history of syncope in 2010 felt secondary to low blood pressure that improved with medication changes, with chest pain symptoms at the beginning of April 2013, stress test showing inferior lateral hypoperfusion consistent with large region of scar which was new from 2010, cardiac catheterization showing severe ramus disease, with DES stent placed in May 2013. Followup catheterization September 2013 for chest pain showing patent stent, distal left circumflex disease noted, moderate in nature. Medical management recommended. He presents today for follow-up of his coronary artery disease  Rare episodes of chest pain at rest  3 to 4 spells at rest Take NTG, up to 15 min Reports it is stable he is active, continues to work out 2 days with a Physiological scientist.  Does cardio and weightlifting Has periodic follow-up with psychiatry,  Continues to have problems with outbursts of anger  EKG on today's visit shows normal sinus rhythm with rate 73 bpm no significant ST or T-wave changes  Other past medical history reviewed Previous surgery for ED with implant down place. Overall has healed well, happy with his decision. He is back at work, denies any significant chest pain concerning for angina. Has good energy Is wearing his CPAP for sleep apnea  Other past medical history He does have a previous history of smoking, quit in 1992. Father had his first heart attack at age 62.  PMH:   has a past medical history of GERD (gastroesophageal reflux disease); Hypertension;  Anxiety; Gout; Dysplastic polyp of colon (2008); Treadmill stress test negative for angina pectoris (2011); Syncopal episodes; Hyperlipidemia; Sleep apnea; Angina; Myocardial infarction [facility]) (05/2011); Kidney stones; and Diastasis of muscle (2011).  PSH:    Past Surgical History  Procedure Laterality Date  . Hemorrhoid surgery  2000  . Inguinal hernia repair  1987    left  . Lithotripsy  10/2008  . Ureteroscopy  10/2009  . Cardiac catheterization  06/2011    "diagnostic"  . Coronary angioplasty with stent placement  07/09/11    "1"  . Upper gi endoscopy    . Colonoscopy    . Percutaneous coronary stent intervention (pci-s) N/A 07/09/2011    Procedure: PERCUTANEOUS CORONARY STENT INTERVENTION (PCI-S);  Surgeon: Sherren Mocha, MD;  Location[facility] LAB;  Service: Cardiovascular;  Laterality: N/A;  . Left heart catheterization with coronary angiogram N/A 10/21/2011    Procedure: LEFT HEART CATHETERIZATION WITH CORONARY ANGIOGRAM;  Surgeon: Hillary Bow, MD;  Location: [facility] CATH LAB;  Service: Cardiovascular;  Laterality: N/A;    Current Outpatient Prescriptions  Medication Sig Dispense Refill  . allopurinol (ZYLOPRIM) 100 MG tablet TAKE 1 TABLET BY MOUTH TWICE DAILY 180 tablet 0  . amLODipine (NORVASC) 10 MG tablet Take 1 tablet (10 mg total) by mouth daily. 90 tablet 3  . atorvastatin (LIPITOR) 20 MG tablet TAKE ONE TABLET BY MOUTH EVERY DAY 90 tablet 3  . cetirizine (ZYRTEC) 10 MG tablet Take 10 mg by mouth daily.    Marland Kitchen CINNAMON PO Take 1,000  mg by mouth 2 (two) times daily.     . clopidogrel (PLAVIX) 75 MG tablet Take 1 tablet (75 mg total) by mouth daily. 90 tablet 3  . fluticasone (FLONASE) 50 MCG/ACT nasal spray 2 SPRAYS INTO BOTH NOSTRILS DAILY 16 g 0  . hydrochlorothiazide (HYDRODIURIL) 25 MG tablet TAKE ONE TABLET AT BEDTIME 90 tablet 3  . lamoTRIgine (LAMICTAL) 200 MG tablet Take 200 mg by mouth daily.  0  . magnesium oxide (MAG-OX) 400 MG tablet Take 400 mg by mouth daily.    .  metoprolol tartrate (LOPRESSOR) 25 MG tablet ONE-HALF TABLET BY MOUTH EVERY DAY 45 tablet 3  . nitroGLYCERIN (NITROSTAT) 0.4 MG SL tablet Place 1 tablet (0.4 mg total) under the tongue every 5 (five) minutes as needed for chest pain. 25 tablet 3  . pantoprazole (PROTONIX) 40 MG tablet Take 1 tablet (40 mg total) by mouth daily. 90 tablet 1  . polyethylene glycol powder (GLYCOLAX/MIRALAX) powder TAKE 17 GRAMS DAILY 527 g 3  . potassium citrate (UROCIT-K) 10 MEQ (1080 MG) SR tablet Take 1 tablet (10 mEq total) by mouth 2 (two) times daily. 180 each 1  . Psyllium (METAMUCIL PO) Take by mouth.    . tamsulosin (FLOMAX) 0.4 MG CAPS Take 0.4 mg by mouth daily.    . Testosterone (ANDROGEL PUMP) 20.25 MG/ACT (1.62%) GEL Place 1 application onto the skin daily.    . TRAZODONE HCL PO Take 200 mg by mouth at bedtime.    . valsartan-hydrochlorothiazide (DIOVAN-HCT) 320-25 MG tablet TAKE ONE TABLET BY MOUTH EVERY DAY 90 tablet 3  . venlafaxine XR (EFFEXOR-XR) 75 MG 24 hr capsule Take 150 mg by mouth daily with breakfast.     . VITAMIN D, CHOLECALCIFEROL, PO Take 2,000 mg by mouth daily.    . vitamin E 1000 UNIT capsule Take 1,000 Units by mouth daily.     No current facility-administered medications for this visit.     Allergies:   Review of patient's allergies indicates no known allergies.   Social History:  The patient  reports that he quit smoking about 25 years ago. His smoking use included Cigarettes. He has a 10 pack-year smoking history. He has never used smokeless tobacco. He reports that he drinks about 2.4 oz of alcohol per week. He reports that he uses illicit drugs (Marijuana) about 7 times per week.   Family History:   family history includes Heart attack (age of onset: 51) in his father; Hypertension in his other.    Review of Systems: Review of Systems  Constitutional: Negative.   Respiratory: Negative.   Cardiovascular: Positive for chest pain.  Gastrointestinal: Negative.    Musculoskeletal: Negative.   Neurological: Negative.   Psychiatric/Behavioral: Negative.   All other systems reviewed and are negative.    PHYSICAL EXAM: VS:  BP 146/82 mmHg  Pulse 73  Ht 6' (1.829 m)  Wt 229 lb 8 oz (104.101 kg)  BMI 31.12 kg/m2 , BMI Body mass index is 31.12 kg/(m^2). GEN: Well nourished, well developed, in no acute distress HEENT: normal Neck: no JVD, carotid bruits, or masses Cardiac: RRR; no murmurs, rubs, or gallops,no edema  Respiratory:  clear to auscultation bilaterally, normal work of breathing GI: soft, nontender, nondistended, + BS MS: no deformity or atrophy Skin: warm and dry, no rash Neuro:  Strength and sensation are intact Psych: euthymic mood, full affect    Recent Labs: 03/13/2015: ALT 28; BUN 15; Creatinine, Ser 1.12; Hemoglobin 15.8; Platelets 200.0; Potassium 4.0; Sodium  141; TSH 2.45    Lipid Panel Lab Results  Component Value Date   CHOL 112 03/13/2015   HDL 28.00* 03/13/2015   LDLCALC 54 03/13/2015   TRIG 147.0 03/13/2015      Wt Readings from Last 3 Encounters:  09/03/15 229 lb 8 oz (104.101 kg)  03/19/15 231 lb (104.781 kg)  08/29/14 231 lb (104.781 kg)       ASSESSMENT AND PLAN:  Coronary artery disease due to calcified coronary lesion - Plan: EKG 12-Lead Rare episodes of chest pain, stable, relieved with nitroglycerin He does not want further testing at this time Recommended he call if symptoms become more frequent, last longer Discussed with wife who is present at the visit today  Essential hypertension - Plan: EKG 12-Lead Blood pressure is well controlled on today's visit. No changes made to the medications.  Unstable angina (HCC) As above, having rare stable episodes relieved with nitroglycerin Typically occur after exertion while at rest Plan as above  Hyperlipidemia Cholesterol is at goal on the current lipid regimen. No changes to the medications were made.  Chest pressure Stable, symptoms as  above  Anxiety Continues to have outbursts   Total encounter time more than 15 minutes  Greater than 50% was spent in counseling and coordination of care with the patient   Disposition:   F/U  6 months   Orders Placed This Encounter  Procedures  . EKG 12-Lead     Signed, Esmond Plants, M.D., Ph.D. 09/03/2015  [place], [place]

## 2015-09-11 DIAGNOSIS — R972 Elevated prostate specific antigen [PSA]: Secondary | ICD-10-CM | POA: Diagnosis not present

## 2015-09-11 DIAGNOSIS — N529 Male erectile dysfunction, unspecified: Secondary | ICD-10-CM | POA: Diagnosis not present

## 2015-09-11 DIAGNOSIS — R339 Retention of urine, unspecified: Secondary | ICD-10-CM | POA: Diagnosis not present

## 2015-09-11 DIAGNOSIS — E291 Testicular hypofunction: Secondary | ICD-10-CM | POA: Diagnosis not present

## 2015-09-11 DIAGNOSIS — N401 Enlarged prostate with lower urinary tract symptoms: Secondary | ICD-10-CM | POA: Diagnosis not present

## 2015-09-11 DIAGNOSIS — N200 Calculus of kidney: Secondary | ICD-10-CM | POA: Diagnosis not present

## 2015-09-12 ENCOUNTER — Other Ambulatory Visit (INDEPENDENT_AMBULATORY_CARE_PROVIDER_SITE_OTHER): Payer: BLUE CROSS/BLUE SHIELD

## 2015-09-12 DIAGNOSIS — R739 Hyperglycemia, unspecified: Secondary | ICD-10-CM | POA: Diagnosis not present

## 2015-09-12 DIAGNOSIS — E785 Hyperlipidemia, unspecified: Secondary | ICD-10-CM | POA: Diagnosis not present

## 2015-09-12 DIAGNOSIS — Z1159 Encounter for screening for other viral diseases: Secondary | ICD-10-CM

## 2015-09-12 DIAGNOSIS — I10 Essential (primary) hypertension: Secondary | ICD-10-CM | POA: Diagnosis not present

## 2015-09-12 LAB — BASIC METABOLIC PANEL
BUN: 21 mg/dL (ref 6–23)
CALCIUM: 9.5 mg/dL (ref 8.4–10.5)
CO2: 29 meq/L (ref 19–32)
Chloride: 105 mEq/L (ref 96–112)
Creatinine, Ser: 0.99 mg/dL (ref 0.40–1.50)
GFR: 82.21 mL/min (ref >60.00)
GLUCOSE: 130 mg/dL — AB (ref 70–99)
POTASSIUM: 3.7 meq/L (ref 3.5–5.1)
SODIUM: 139 meq/L (ref 135–145)

## 2015-09-12 LAB — LIPID PANEL
CHOLESTEROL: 127 mg/dL (ref 0–200)
HDL: 28.3 mg/dL — ABNORMAL LOW (ref >39.00)
LDL CALC: 71 mg/dL (ref 0–99)
NonHDL: 99.11
Total CHOL/HDL Ratio: 5
Triglycerides: 141 mg/dL (ref 0.0–149.0)
VLDL: 28.2 mg/dL (ref 0.0–40.0)

## 2015-09-12 LAB — HEPATIC FUNCTION PANEL
ALT: 24 U/L (ref 0–53)
AST: 26 U/L (ref 0–37)
Albumin: 4.5 g/dL (ref 3.5–5.2)
Alkaline Phosphatase: 39 U/L (ref 39–117)
BILIRUBIN DIRECT: 0.1 mg/dL (ref 0.0–0.3)
BILIRUBIN TOTAL: 0.7 mg/dL (ref 0.2–1.2)
Total Protein: 7 g/dL (ref 6.0–8.3)

## 2015-09-12 LAB — HEMOGLOBIN A1C: Hgb A1c MFr Bld: 5.9 % (ref 4.6–6.5)

## 2015-09-13 ENCOUNTER — Encounter: Payer: Self-pay | Admitting: Internal Medicine

## 2015-09-13 LAB — HEPATITIS C ANTIBODY: HCV Ab: NEGATIVE

## 2015-09-17 ENCOUNTER — Other Ambulatory Visit: Payer: Self-pay | Admitting: Internal Medicine

## 2015-09-17 ENCOUNTER — Encounter: Payer: Self-pay | Admitting: Internal Medicine

## 2015-09-17 ENCOUNTER — Ambulatory Visit (INDEPENDENT_AMBULATORY_CARE_PROVIDER_SITE_OTHER): Payer: BLUE CROSS/BLUE SHIELD | Admitting: Internal Medicine

## 2015-09-17 VITALS — BP 112/60 | HR 66 | Temp 97.5°F | Resp 18 | Wt 229.4 lb

## 2015-09-17 DIAGNOSIS — I200 Unstable angina: Secondary | ICD-10-CM | POA: Diagnosis not present

## 2015-09-17 DIAGNOSIS — M25649 Stiffness of unspecified hand, not elsewhere classified: Secondary | ICD-10-CM

## 2015-09-17 DIAGNOSIS — R945 Abnormal results of liver function studies: Secondary | ICD-10-CM

## 2015-09-17 DIAGNOSIS — R739 Hyperglycemia, unspecified: Secondary | ICD-10-CM

## 2015-09-17 DIAGNOSIS — K7689 Other specified diseases of liver: Secondary | ICD-10-CM

## 2015-09-17 DIAGNOSIS — K2100 Gastro-esophageal reflux disease with esophagitis, without bleeding: Secondary | ICD-10-CM

## 2015-09-17 DIAGNOSIS — E785 Hyperlipidemia, unspecified: Secondary | ICD-10-CM | POA: Diagnosis not present

## 2015-09-17 DIAGNOSIS — I2510 Atherosclerotic heart disease of native coronary artery without angina pectoris: Secondary | ICD-10-CM

## 2015-09-17 DIAGNOSIS — I10 Essential (primary) hypertension: Secondary | ICD-10-CM | POA: Diagnosis not present

## 2015-09-17 DIAGNOSIS — F419 Anxiety disorder, unspecified: Secondary | ICD-10-CM

## 2015-09-17 DIAGNOSIS — M25512 Pain in left shoulder: Secondary | ICD-10-CM

## 2015-09-17 DIAGNOSIS — I2584 Coronary atherosclerosis due to calcified coronary lesion: Secondary | ICD-10-CM

## 2015-09-17 DIAGNOSIS — M25511 Pain in right shoulder: Secondary | ICD-10-CM

## 2015-09-17 NOTE — Assessment & Plan Note (Signed)
On lipitor.  Low cholesterol diet and exercise.  LDL 71.  HDL 28.  Continue current medication and exercise.

## 2015-09-17 NOTE — Assessment & Plan Note (Signed)
Low carb diet and exercise.  Follow met b and a1c.  

## 2015-09-17 NOTE — Assessment & Plan Note (Signed)
Last liver panel wnl.  

## 2015-09-17 NOTE — Assessment & Plan Note (Signed)
Sees Dr Nicolasa Ducking.  Stable on current regimen.  Follow.

## 2015-09-17 NOTE — Assessment & Plan Note (Signed)
See Dr Donivan Scull note. No report of pain today.  Continue medical management.

## 2015-09-17 NOTE — Progress Notes (Signed)
Patient ID: Keith Gross., male   DOB: 1956-01-05, 60 y.o.   MRN: [id number]   Subjective:    Patient ID: Keith Gross., male    DOB: Jun 26, 1955, 60 y.o.   MRN: [id number]  HPI  Patient here for a scheduled follow up.  He has known CAD.  Sees Dr Rockey Situ.  Just evaluated 09/03/15.  See note.   Elected continued Conservation officer, [employer].  Desired no further testing.  Still seeing Dr Nicolasa Ducking.  Has been doing better on his current regimen.  See her note.  His main complaint today is that of left (and some right) shoulder stiffness and pain and bilateral hand stiffness.  Worse in am.  Better with movement.  He is taking an increased amount of ibuprofen.  Has been taking some of his wife's tramadol.  Discussed the need to stop tramadol, given interaction with other medications he is taking.  Discussed increased risk of increased ibuprofen.  Discussed using tylenol to help with pain.  Restart shoulder exercise regularly.  No sob.  No acid reflux or GI issues.  No abdominal pain or cramping.  Bowels stable.    Past Medical History:  Diagnosis Date  . Angina   . Anxiety   . Diastasis of muscle 2011   "abdominal"  . Dysplastic polyp of colon 2008   [facility]  . GERD (gastroesophageal reflux disease)   . Gout    Multiple episodes  . Hyperlipidemia   . Hypertension   . Kidney stones   . Myocardial infarction [facility]) 05/2011   "we think";  06/2011 abnl mv;  06/2011 Cath - subtotal occlusion of Ramus, otw nonobs dzs;  06/2011 PCI/DES of Ramus w/ 2.25x74m Resolute DES   . Sleep apnea    on CPAP  . Syncopal episodes   . Treadmill stress test negative for angina pectoris 2011   Past Surgical History:  Procedure Laterality Date  . CARDIAC CATHETERIZATION  06/2011   "diagnostic"  . COLONOSCOPY    . CORONARY ANGIOPLASTY WITH STENT PLACEMENT  07/09/11   "1"  . [place]  . LEFT HEART CATHETERIZATION WITH CORONARY ANGIOGRAM N/A 10/21/2011   Procedure: LEFT HEART  CATHETERIZATION WITH CORONARY ANGIOGRAM;  Surgeon: THillary Bow MD;  [facility];  Service: Cardiovascular;  Laterality: N/A;  . LITHOTRIPSY  10/2008  . PERCUTANEOUS CORONARY STENT INTERVENTION (PCI-S) N/A 07/09/2011   Procedure: PERCUTANEOUS CORONARY STENT INTERVENTION (PCI-S);  Surgeon: MSherren Mocha MD;  [facility];  Service: Cardiovascular;  Laterality: N/A;  . UPPER GI ENDOSCOPY    . URETEROSCOPY  10/2009   Family History  Problem Relation Age of Onset  . Heart attack Father 452   LVAD  . Hypertension Other    Social History   Social History  . Marital status: Single    Spouse name: N/A  . Number of children: 4  . Years of education: N/A   Occupational History  . Health insurance broker    Social History Main Topics  . Smoking status: Former Smoker    Packs/day: 0.50    Years: 20.00    Types: Cigarettes    Quit date: 02/17/1990  . Smokeless tobacco: Never Used  . Alcohol use 2.4 oz/week    2 Glasses of wine, 2 Cans of beer per week  . Drug use:     Frequency: 7.0 times per week    Types: Marijuana  Comment: 07/09/11 "lets say a cigarette a day"  . Sexual activity: Yes   Other Topics Concern  . None   Social History Narrative   Married and has 4 children.      Outpatient Encounter Prescriptions as of 09/17/2015  Medication Sig  . allopurinol (ZYLOPRIM) 100 MG tablet TAKE 1 TABLET BY MOUTH TWICE DAILY  . amLODipine (NORVASC) 10 MG tablet Take 1 tablet (10 mg total) by mouth daily.  Marland Kitchen atorvastatin (LIPITOR) 20 MG tablet TAKE ONE TABLET BY MOUTH EVERY DAY  . cetirizine (ZYRTEC) 10 MG tablet Take 10 mg by mouth daily.  Marland Kitchen CINNAMON PO Take 1,000 mg by mouth 2 (two) times daily.   . clopidogrel (PLAVIX) 75 MG tablet Take 1 tablet (75 mg total) by mouth daily.  . fluticasone (FLONASE) 50 MCG/ACT nasal spray 2 SPRAYS INTO BOTH NOSTRILS DAILY  . hydrochlorothiazide (HYDRODIURIL) 25 MG tablet TAKE ONE TABLET AT BEDTIME  . lamoTRIgine (LAMICTAL)  200 MG tablet Take 200 mg by mouth daily.  . magnesium oxide (MAG-OX) 400 MG tablet Take 400 mg by mouth daily.  . metoprolol tartrate (LOPRESSOR) 25 MG tablet ONE-HALF TABLET BY MOUTH EVERY DAY  . nitroGLYCERIN (NITROSTAT) 0.4 MG SL tablet Place 1 tablet (0.4 mg total) under the tongue every 5 (five) minutes as needed for chest pain.  . pantoprazole (PROTONIX) 40 MG tablet Take 1 tablet (40 mg total) by mouth daily.  . polyethylene glycol powder (GLYCOLAX/MIRALAX) powder TAKE 17 GRAMS DAILY  . potassium citrate (UROCIT-K) 10 MEQ (1080 MG) SR tablet Take 1 tablet (10 mEq total) by mouth 2 (two) times daily.  . Psyllium (METAMUCIL PO) Take by mouth.  . tamsulosin (FLOMAX) 0.4 MG CAPS Take 0.4 mg by mouth daily.  . Testosterone (ANDROGEL PUMP) 20.25 MG/ACT (1.62%) GEL Place 1 application onto the skin daily.  . TRAZODONE HCL PO Take 200 mg by mouth at bedtime.  . valsartan-hydrochlorothiazide (DIOVAN-HCT) 320-25 MG tablet TAKE ONE TABLET BY MOUTH EVERY DAY  . venlafaxine XR (EFFEXOR-XR) 75 MG 24 hr capsule Take 150 mg by mouth daily with breakfast.   . VITAMIN D, CHOLECALCIFEROL, PO Take 2,000 mg by mouth daily.  . vitamin E 1000 UNIT capsule Take 1,000 Units by mouth daily.   No facility-administered encounter medications on file as of 09/17/2015.     Review of Systems  Constitutional: Negative for appetite change and unexpected weight change.  HENT: Negative for congestion and sinus pressure.   Respiratory: Negative for cough, chest tightness and shortness of breath.   Cardiovascular: Negative for chest pain, palpitations and leg swelling.  Gastrointestinal: Negative for abdominal pain, diarrhea, nausea and vomiting.  Genitourinary: Negative for difficulty urinating and dysuria.  Musculoskeletal:       Bilateral hand stiffness.  Bilateral shoulder pain and stiffness (left > right).    Skin: Negative for color change and rash.  Neurological: Negative for dizziness, light-headedness and  headaches.  Psychiatric/Behavioral:       Seeing Dr Maryruth Bun.  Feels his medications are keeping him level/stable.        Objective:     Blood pressure rechecked by me:  132/78  Physical Exam  Constitutional: He appears well-developed and well-nourished. No distress.  HENT:  Nose: Nose normal.  Mouth/Throat: Oropharynx is clear and moist.  Neck: Neck supple. No thyromegaly present.  Cardiovascular: Normal rate and regular rhythm.   Pulmonary/Chest: Effort normal and breath sounds normal. No respiratory distress.  Abdominal: Soft. Bowel sounds are normal. There is no  tenderness.  Musculoskeletal: He exhibits no edema or tenderness.  Lymphadenopathy:    He has no cervical adenopathy.  Skin: No rash noted. No erythema.  Psychiatric: He has a normal mood and affect. His behavior is normal.    BP 112/60 (BP Location: Left Arm, Patient Position: Sitting, Cuff Size: Large)   Pulse 66   Temp 97.5 F (36.4 C) (Oral)   Resp 18   Ht (P) 6' (1.829 m)   Wt 229 lb 6 oz (104 kg)   SpO2 97%   BMI (P) 31.11 kg/m  Wt Readings from Last 3 Encounters:  09/17/15 229 lb 6 oz (104 kg)  09/03/15 229 lb 8 oz (104.1 kg)  03/19/15 231 lb (104.8 kg)     Lab Results  Component Value Date   WBC 5.7 03/13/2015   HGB 15.8 03/13/2015   HCT 47.1 03/13/2015   PLT 200.0 03/13/2015   GLUCOSE 130 (H) 09/12/2015   CHOL 127 09/12/2015   TRIG 141.0 09/12/2015   HDL 28.30 (L) 09/12/2015   LDLCALC 71 09/12/2015   ALT 24 09/12/2015   AST 26 09/12/2015   NA 139 09/12/2015   K 3.7 09/12/2015   CL 105 09/12/2015   CREATININE 0.99 09/12/2015   BUN 21 09/12/2015   CO2 29 09/12/2015   TSH 2.45 03/13/2015   INR 1.11 10/19/2011   HGBA1C 5.9 09/12/2015       Assessment & Plan:   Problem List Items Addressed This Visit    Abnormal liver function    Last liver panel wnl.        Anxiety    Sees Dr Nicolasa Ducking.  Stable on current regimen.  Follow.        CAD (coronary artery disease)    Sees Dr Rockey Situ.   Just evaluated.  See note.  Stable.  Continue medical management.        GERD (gastroesophageal reflux disease)    EGD 08/23/13 as outlined in overview.  Currently asymptomatic on protonix.  Follow.        Hyperglycemia    Low carb diet and exercise.  Follow met b and a1c.       Relevant Orders   Hemoglobin A1c   Hyperlipidemia - Primary    On lipitor.  Low cholesterol diet and exercise.  LDL 71.  HDL 28.  Continue current medication and exercise.        Relevant Orders   Hepatic function panel   Lipid panel   Hypertension    Blood pressure under good control.  Continue same medication regimen.  Follow pressures.  Follow metabolic panel.        Relevant Orders   CBC with Differential/Platelet   TSH   Basic metabolic panel   Unstable angina (HCC)    See Dr Donivan Scull note. No report of pain today.  Continue medical management.         Other Visit Diagnoses    Bilateral shoulder pain       better with movement.  discussed xray.  he wants to hold.  exercises.  decrease ibuprofen.  tylenol as directed.  follow.    Joint stiffness of hand, unspecified laterality       stiffness as outlined.  exercise.  follow.        Einar Pheasant, MD

## 2015-09-17 NOTE — Assessment & Plan Note (Signed)
EGD 08/23/13 as outlined in overview.  Currently asymptomatic on protonix.  Follow.

## 2015-09-17 NOTE — Assessment & Plan Note (Signed)
Sees Dr Rockey Situ.  Just evaluated.  See note.  Stable.  Continue medical management.

## 2015-09-17 NOTE — Assessment & Plan Note (Signed)
Blood pressure under good control.  Continue same medication regimen.  Follow pressures.  Follow metabolic panel.   

## 2015-09-17 NOTE — Progress Notes (Signed)
Pre-visit discussion using our clinic review tool. No additional management support is needed unless otherwise documented below in the visit note.  

## 2015-09-26 ENCOUNTER — Other Ambulatory Visit: Payer: Self-pay | Admitting: Internal Medicine

## 2015-09-26 MED ORDER — FLUTICASONE PROPIONATE 50 MCG/ACT NA SUSP: 2.0000 | Freq: Every day | NASAL | 3 refills | Status: DC

## 2015-10-11 ENCOUNTER — Other Ambulatory Visit: Payer: Self-pay | Admitting: *Deleted

## 2015-10-11 MED ORDER — METOPROLOL TARTRATE 25 MG PO TABS: ORAL_TABLET | ORAL | 3 refills | Status: DC

## 2015-10-25 ENCOUNTER — Other Ambulatory Visit: Payer: Self-pay | Admitting: Internal Medicine

## 2015-11-27 ENCOUNTER — Other Ambulatory Visit: Payer: Self-pay | Admitting: Internal Medicine

## 2015-12-06 DIAGNOSIS — G4733 Obstructive sleep apnea (adult) (pediatric): Secondary | ICD-10-CM | POA: Diagnosis not present

## 2015-12-26 DIAGNOSIS — Z23 Encounter for immunization: Secondary | ICD-10-CM | POA: Diagnosis not present

## 2016-01-07 ENCOUNTER — Other Ambulatory Visit: Payer: Self-pay | Admitting: Internal Medicine

## 2016-01-07 ENCOUNTER — Other Ambulatory Visit: Payer: Self-pay

## 2016-01-07 MED ORDER — VALSARTAN-HYDROCHLOROTHIAZIDE 320-25 MG PO TABS: 1.0000 | ORAL_TABLET | Freq: Every day | ORAL | 3 refills | Status: DC

## 2016-02-28 DIAGNOSIS — F3181 Bipolar II disorder: Secondary | ICD-10-CM | POA: Diagnosis not present

## 2016-02-28 DIAGNOSIS — G4700 Insomnia, unspecified: Secondary | ICD-10-CM | POA: Diagnosis not present

## 2016-02-28 DIAGNOSIS — F1220 Cannabis dependence, uncomplicated: Secondary | ICD-10-CM | POA: Diagnosis not present

## 2016-03-13 DIAGNOSIS — G4733 Obstructive sleep apnea (adult) (pediatric): Secondary | ICD-10-CM | POA: Diagnosis not present

## 2016-03-19 DIAGNOSIS — E291 Testicular hypofunction: Secondary | ICD-10-CM | POA: Diagnosis not present

## 2016-03-19 DIAGNOSIS — Z79899 Other long term (current) drug therapy: Secondary | ICD-10-CM | POA: Diagnosis not present

## 2016-03-19 DIAGNOSIS — R972 Elevated prostate specific antigen [PSA]: Secondary | ICD-10-CM | POA: Diagnosis not present

## 2016-03-19 DIAGNOSIS — N529 Male erectile dysfunction, unspecified: Secondary | ICD-10-CM | POA: Diagnosis not present

## 2016-03-19 DIAGNOSIS — N138 Other obstructive and reflux uropathy: Secondary | ICD-10-CM | POA: Diagnosis not present

## 2016-03-19 DIAGNOSIS — N401 Enlarged prostate with lower urinary tract symptoms: Secondary | ICD-10-CM | POA: Diagnosis not present

## 2016-03-20 ENCOUNTER — Other Ambulatory Visit (INDEPENDENT_AMBULATORY_CARE_PROVIDER_SITE_OTHER): Payer: BLUE CROSS/BLUE SHIELD

## 2016-03-20 DIAGNOSIS — R739 Hyperglycemia, unspecified: Secondary | ICD-10-CM | POA: Diagnosis not present

## 2016-03-20 DIAGNOSIS — E785 Hyperlipidemia, unspecified: Secondary | ICD-10-CM

## 2016-03-20 DIAGNOSIS — I10 Essential (primary) hypertension: Secondary | ICD-10-CM

## 2016-03-20 LAB — BASIC METABOLIC PANEL
BUN: 21 mg/dL (ref 6–23)
CALCIUM: 9.7 mg/dL (ref 8.4–10.5)
CO2: 32 meq/L (ref 19–32)
Chloride: 102 mEq/L (ref 96–112)
Creatinine, Ser: 1.09 mg/dL (ref 0.40–1.50)
GFR: 73.44 mL/min (ref >60.00)
GLUCOSE: 160 mg/dL — AB (ref 70–99)
Potassium: 3.7 mEq/L (ref 3.5–5.1)
SODIUM: 138 meq/L (ref 135–145)

## 2016-03-20 LAB — HEPATIC FUNCTION PANEL
ALK PHOS: 42 U/L (ref 39–117)
ALT: 30 U/L (ref 0–53)
AST: 30 U/L (ref 0–37)
Albumin: 4.8 g/dL (ref 3.5–5.2)
BILIRUBIN DIRECT: 0.1 mg/dL (ref 0.0–0.3)
BILIRUBIN TOTAL: 0.8 mg/dL (ref 0.2–1.2)
Total Protein: 7 g/dL (ref 6.0–8.3)

## 2016-03-20 LAB — CBC WITH DIFFERENTIAL/PLATELET
BASOS ABS: 0 10*3/uL (ref 0.0–0.1)
Basophils Relative: 0.4 % (ref 0.0–3.0)
EOS ABS: 0.1 10*3/uL (ref 0.0–0.7)
Eosinophils Relative: 1.4 % (ref 0.0–5.0)
HEMATOCRIT: 46.6 % (ref 39.0–52.0)
HEMOGLOBIN: 16.1 g/dL (ref 13.0–17.0)
LYMPHS PCT: 16.2 % (ref 12.0–46.0)
Lymphs Abs: 1.4 10*3/uL (ref 0.7–4.0)
MCHC: 34.5 g/dL (ref 30.0–36.0)
MCV: 92 fl (ref 78.0–100.0)
Monocytes Absolute: 0.6 10*3/uL (ref 0.1–1.0)
Monocytes Relative: 7.2 % (ref 3.0–12.0)
NEUTROS ABS: 6.5 10*3/uL (ref 1.4–7.7)
Neutrophils Relative %: 74.8 % (ref 43.0–77.0)
Platelets: 160 10*3/uL (ref 150.0–400.0)
RBC: 5.07 Mil/uL (ref 4.22–5.81)
RDW: 13.3 % (ref 11.5–15.5)
WBC: 8.7 10*3/uL (ref 4.0–10.5)

## 2016-03-20 LAB — LIPID PANEL
CHOL/HDL RATIO: 5
Cholesterol: 141 mg/dL (ref 0–200)
HDL: 29.5 mg/dL — AB (ref >39.00)
LDL Cholesterol: 79 mg/dL (ref 0–99)
NonHDL: 111.29
Triglycerides: 160 mg/dL — ABNORMAL HIGH (ref 0.0–149.0)
VLDL: 32 mg/dL (ref 0.0–40.0)

## 2016-03-20 LAB — TSH: TSH: 3.66 u[IU]/mL (ref 0.35–4.50)

## 2016-03-20 LAB — HEMOGLOBIN A1C: Hgb A1c MFr Bld: 5.9 % (ref 4.6–6.5)

## 2016-03-23 ENCOUNTER — Encounter: Payer: Self-pay | Admitting: Internal Medicine

## 2016-03-25 ENCOUNTER — Encounter: Payer: Self-pay | Admitting: Internal Medicine

## 2016-03-25 ENCOUNTER — Ambulatory Visit (INDEPENDENT_AMBULATORY_CARE_PROVIDER_SITE_OTHER): Payer: BLUE CROSS/BLUE SHIELD | Admitting: Internal Medicine

## 2016-03-25 VITALS — BP 120/62 | HR 85 | Temp 98.6°F | Resp 16 | Ht 72.0 in | Wt 232.8 lb

## 2016-03-25 DIAGNOSIS — I2510 Atherosclerotic heart disease of native coronary artery without angina pectoris: Secondary | ICD-10-CM

## 2016-03-25 DIAGNOSIS — Z0000 Encounter for general adult medical examination without abnormal findings: Secondary | ICD-10-CM | POA: Diagnosis not present

## 2016-03-25 DIAGNOSIS — I10 Essential (primary) hypertension: Secondary | ICD-10-CM | POA: Diagnosis not present

## 2016-03-25 DIAGNOSIS — K2100 Gastro-esophageal reflux disease with esophagitis, without bleeding: Secondary | ICD-10-CM

## 2016-03-25 DIAGNOSIS — G4730 Sleep apnea, unspecified: Secondary | ICD-10-CM | POA: Diagnosis not present

## 2016-03-25 DIAGNOSIS — I2584 Coronary atherosclerosis due to calcified coronary lesion: Secondary | ICD-10-CM

## 2016-03-25 DIAGNOSIS — F419 Anxiety disorder, unspecified: Secondary | ICD-10-CM

## 2016-03-25 DIAGNOSIS — K7689 Other specified diseases of liver: Secondary | ICD-10-CM

## 2016-03-25 DIAGNOSIS — R739 Hyperglycemia, unspecified: Secondary | ICD-10-CM

## 2016-03-25 DIAGNOSIS — D696 Thrombocytopenia, unspecified: Secondary | ICD-10-CM | POA: Diagnosis not present

## 2016-03-25 DIAGNOSIS — E785 Hyperlipidemia, unspecified: Secondary | ICD-10-CM

## 2016-03-25 DIAGNOSIS — Z860100 Personal history of colon polyps, unspecified: Secondary | ICD-10-CM

## 2016-03-25 DIAGNOSIS — R945 Abnormal results of liver function studies: Secondary | ICD-10-CM

## 2016-03-25 NOTE — Progress Notes (Signed)
Pre-visit discussion using our clinic review tool. No additional management support is needed unless otherwise documented below in the visit note.  

## 2016-03-25 NOTE — Assessment & Plan Note (Signed)
Physical today 03/25/16.  Prostate and psa followed by Dr Jacqlyn Larsen.  Colonoscopy 08/2013.

## 2016-03-25 NOTE — Progress Notes (Signed)
Patient ID: Keith Gross., male   DOB: 02/01/1956, 60 y.o.   MRN: [id number]   Subjective:    Patient ID: Keith Gross., male    DOB: 09/17/1955, 60 y.o.   MRN: [id number]  HPI  Patient here for his physical exam.  States he is doing well. Feels good.  Is exercising.  No chest pain.  No sob.  No acid reflux.  No abdominal pain or cramping.  Bowels stable.  Increased stress.  18 year old grandson living with them.  Seeing psychiatry.  Overall feels things are stable.  Seeing Dr Cope for his prostate.  Stable.  Recommended f/u in 6 months.     Past Medical History:  Diagnosis Date  . Angina   . Anxiety   . Diastasis of muscle 2011   "abdominal"  . Dysplastic polyp of colon 2008   [facility]  . GERD (gastroesophageal reflux disease)   . Gout    Multiple episodes  . Hyperlipidemia   . Hypertension   . Kidney stones   . Myocardial infarction 05/2011   "we think";  06/2011 abnl mv;  06/2011 Cath - subtotal occlusion of Ramus, otw nonobs dzs;  06/2011 PCI/DES of Ramus w/ 2.25x18mm Resolute DES   . Sleep apnea    on CPAP  . Syncopal episodes   . Treadmill stress test negative for angina pectoris 2011   Past Surgical History:  Procedure Laterality Date  . CARDIAC CATHETERIZATION  06/2011   "diagnostic"  . COLONOSCOPY    . CORONARY ANGIOPLASTY WITH STENT PLACEMENT  07/09/11   "1"  . HEMORRHOID SURGERY  2000  . INGUINAL HERNIA REPAIR  1987   left  . LEFT HEART CATHETERIZATION WITH CORONARY ANGIOGRAM N/A 10/21/2011   Procedure: LEFT HEART CATHETERIZATION WITH CORONARY ANGIOGRAM;  Surgeon: Thomas D Stuckey, MD;  Location: [facility] CATH LAB;  Service: Cardiovascular;  Laterality: N/A;  . LITHOTRIPSY  10/2008  . PERCUTANEOUS CORONARY STENT INTERVENTION (PCI-S) N/A 07/09/2011   Procedure: PERCUTANEOUS CORONARY STENT INTERVENTION (PCI-S);  Surgeon: Michael Cooper, MD;  Location: [facility] CATH LAB;  Service: Cardiovascular;  Laterality: N/A;  . UPPER GI ENDOSCOPY    . URETEROSCOPY  10/2009   Family History    Problem Relation Age of Onset  . Heart attack Father 49    LVAD  . Hypertension Other    Social History   Social History  . Marital status: Single    Spouse name: N/A  . Number of children: 4  . Years of education: N/A   Occupational History  . Health insurance broker    Social History Main Topics  . Smoking status: Former Smoker    Packs/day: 0.50    Years: 20.00    Types: Cigarettes    Quit date: 02/17/1990  . Smokeless tobacco: Never Used  . Alcohol use 2.4 oz/week    2 Glasses of wine, 2 Cans of beer per week  . Drug use: Yes    Frequency: 7.0 times per week    Types: Marijuana     Comment: 07/09/11 "lets say a cigarette a day"  . Sexual activity: Yes   Other Topics Concern  . None   Social History Narrative   Married and has 4 children.      Outpatient Encounter Prescriptions as of 03/25/2016  Medication Sig  . allopurinol (ZYLOPRIM) 100 MG tablet TAKE 1 TABLET BY MOUTH TWICE DAILY  . atorvastatin (LIPITOR) 20 MG tablet TAKE ONE TABLET BY MOUTH EVERY DAY  .   cetirizine (ZYRTEC) 10 MG tablet Take 10 mg by mouth daily.  Marland Kitchen CINNAMON PO Take 1,000 mg by mouth 2 (two) times daily.   . clopidogrel (PLAVIX) 75 MG tablet Take 1 tablet (75 mg total) by mouth daily.  . fluticasone (FLONASE) 50 MCG/ACT nasal spray Place 2 sprays into both nostrils daily.  . hydrochlorothiazide (HYDRODIURIL) 25 MG tablet TAKE ONE TABLET AT BEDTIME  . lamoTRIgine (LAMICTAL) 200 MG tablet Take 200 mg by mouth daily.  . magnesium oxide (MAG-OX) 400 MG tablet Take 400 mg by mouth daily.  . metoprolol tartrate (LOPRESSOR) 25 MG tablet ONE-HALF TABLET BY MOUTH EVERY DAY  . nitroGLYCERIN (NITROSTAT) 0.4 MG SL tablet Place 1 tablet (0.4 mg total) under the tongue every 5 (five) minutes as needed for chest pain.  . pantoprazole (PROTONIX) 40 MG tablet TAKE ONE TABLET EVERY DAY  . polyethylene glycol powder (GLYCOLAX/MIRALAX) powder TAKE 17 GRAMS DAILY  . potassium citrate (UROCIT-K) 10 MEQ (1080 MG)  SR tablet TAKE ONE TABLET TWICE DAILY  . Psyllium (METAMUCIL PO) Take by mouth.  . tamsulosin (FLOMAX) 0.4 MG CAPS Take 0.4 mg by mouth daily.  . Testosterone (ANDROGEL PUMP) 20.25 MG/ACT (1.62%) GEL Place 1 application onto the skin daily.  . TRAZODONE HCL PO Take 200 mg by mouth at bedtime.  . valsartan-hydrochlorothiazide (DIOVAN-HCT) 320-25 MG tablet Take 1 tablet by mouth daily.  Marland Kitchen venlafaxine XR (EFFEXOR-XR) 75 MG 24 hr capsule Take 150 mg by mouth daily with breakfast.   . VITAMIN D, CHOLECALCIFEROL, PO Take 2,000 mg by mouth daily.  . vitamin E 1000 UNIT capsule Take 1,000 Units by mouth daily.  . [DISCONTINUED] amLODipine (NORVASC) 10 MG tablet Take 1 tablet (10 mg total) by mouth daily.  . [DISCONTINUED] fluticasone (FLONASE) 50 MCG/ACT nasal spray PLACE 2 SPRAYS INTO EACH NOSTRIL EVERY DAY  . [DISCONTINUED] fluticasone (FLONASE) 50 MCG/ACT nasal spray PLACE 2 SPRAYS INTO EACH NOSTRIL EVERY DAY   No facility-administered encounter medications on file as of 03/25/2016.     Review of Systems  Constitutional: Negative for appetite change and unexpected weight change.  HENT: Negative for congestion and sinus pressure.   Eyes: Negative for pain and visual disturbance.  Respiratory: Negative for cough, chest tightness and shortness of breath.   Cardiovascular: Negative for chest pain, palpitations and leg swelling.  Gastrointestinal: Negative for abdominal pain, diarrhea, nausea and vomiting.  Genitourinary: Negative for difficulty urinating and dysuria.  Musculoskeletal: Negative for back pain and joint swelling.  Skin: Negative for color change and rash.  Neurological: Negative for dizziness, light-headedness and headaches.  Hematological: Negative for adenopathy. Does not bruise/bleed easily.  Psychiatric/Behavioral: Negative for agitation and dysphoric mood.       Objective:    Physical Exam  Constitutional: He is oriented to person, place, and time. He appears well-developed  and well-nourished. No distress.  HENT:  Head: Normocephalic and atraumatic.  Nose: Nose normal.  Mouth/Throat: Oropharynx is clear and moist. No oropharyngeal exudate.  Eyes: Conjunctivae are normal. Right eye exhibits no discharge. Left eye exhibits no discharge.  Neck: Neck supple. No thyromegaly present.  Cardiovascular: Normal rate and regular rhythm.   Pulmonary/Chest: Breath sounds normal. No respiratory distress. He has no wheezes.  Abdominal: Soft. Bowel sounds are normal. There is no tenderness.  Genitourinary:  Genitourinary Comments: Performed by urology  Musculoskeletal: He exhibits no edema or tenderness.  Lymphadenopathy:    He has no cervical adenopathy.  Neurological: He is alert and oriented to person, place, and  time.  Skin: Skin is warm and dry. No rash noted. No erythema.  Psychiatric: He has a normal mood and affect. His behavior is normal.    BP 120/62 (BP Location: Right Arm, Patient Position: Sitting, Cuff Size: Large)   Pulse 85   Temp 98.6 F (37 C) (Oral)   Resp 16   Ht 6' (1.829 m)   Wt 232 lb 12.8 oz (105.6 kg)   SpO2 94%   BMI 31.57 kg/m  Wt Readings from Last 3 Encounters:  03/25/16 232 lb 12.8 oz (105.6 kg)  09/17/15 229 lb 6 oz (104 kg)  09/03/15 229 lb 8 oz (104.1 kg)     Lab Results  Component Value Date   WBC 8.7 03/20/2016   HGB 16.1 03/20/2016   HCT 46.6 03/20/2016   PLT 160.0 03/20/2016   GLUCOSE 160 (H) 03/20/2016   CHOL 141 03/20/2016   TRIG 160.0 (H) 03/20/2016   HDL 29.50 (L) 03/20/2016   LDLCALC 79 03/20/2016   ALT 30 03/20/2016   AST 30 03/20/2016   NA 138 03/20/2016   K 3.7 03/20/2016   CL 102 03/20/2016   CREATININE 1.09 03/20/2016   BUN 21 03/20/2016   CO2 32 03/20/2016   TSH 3.66 03/20/2016   INR 1.11 10/19/2011   HGBA1C 5.9 03/20/2016       Assessment & Plan:   Problem List Items Addressed This Visit    Abnormal liver function    Follow liver panel.        Anxiety    Sees Dr Nicolasa Ducking.  Stable on  current regimen.        CAD (coronary artery disease)    Sees Dr Rockey Situ.  Currently doing well.  Exercising.  Follow.  Continue risk factor modification.        GERD (gastroesophageal reflux disease)    On protonix.  Controlled.        Health care maintenance    Physical today 03/25/16.  Prostate and psa followed by Dr Jacqlyn Larsen.  Colonoscopy 08/2013.        History of colonic polyps    Colonoscopy 08/23/13 as outlined.  Recommended f/u colonoscopy in five years.        Hyperglycemia    Low carb diet and exercise.  Follow met b and a1c.        Relevant Orders   Hemoglobin A1c   Hyperlipidemia    Low cholesterol diet and exercise.  Follow lipid panel.        Relevant Orders   Lipid panel   Hepatic function panel   Hypertension    Blood pressure under good control.  Continue same medication regimen.  Follow pressures.  Follow metabolic panel.        Relevant Orders   Basic metabolic panel   Sleep apnea    CPAP.        Thrombocytopenia ([place])    Follow cbc.        Other Visit Diagnoses    Routine general medical examination at a health care facility    -  Primary       Einar Pheasant, MD

## 2016-03-27 ENCOUNTER — Other Ambulatory Visit: Payer: Self-pay

## 2016-03-27 MED ORDER — AMLODIPINE BESYLATE 10 MG PO TABS: 10.0000 mg | ORAL_TABLET | Freq: Every day | ORAL | 3 refills | Status: DC

## 2016-03-27 NOTE — Telephone Encounter (Signed)
Refill sent for amlodipine 10 mg   

## 2016-03-31 ENCOUNTER — Encounter: Payer: Self-pay | Admitting: Internal Medicine

## 2016-03-31 NOTE — Assessment & Plan Note (Signed)
Blood pressure under good control.  Continue same medication regimen.  Follow pressures.  Follow metabolic panel.   

## 2016-03-31 NOTE — Assessment & Plan Note (Signed)
Colonoscopy 08/23/13 as outlined.  Recommended f/u colonoscopy in five years.

## 2016-03-31 NOTE — Assessment & Plan Note (Signed)
Low carb diet and exercise.  Follow met b and a1c.   

## 2016-03-31 NOTE — Assessment & Plan Note (Signed)
Follow liver panel.  

## 2016-03-31 NOTE — Assessment & Plan Note (Signed)
Sees Dr Nicolasa Ducking.  Stable on current regimen.

## 2016-03-31 NOTE — Assessment & Plan Note (Signed)
Low cholesterol diet and exercise.  Follow lipid panel.   

## 2016-03-31 NOTE — Assessment & Plan Note (Signed)
Follow cbc.  

## 2016-03-31 NOTE — Assessment & Plan Note (Signed)
CPAP.  

## 2016-03-31 NOTE — Assessment & Plan Note (Signed)
On protonix.  Controlled.   

## 2016-03-31 NOTE — Assessment & Plan Note (Signed)
Sees Dr Rockey Situ.  Currently doing well.  Exercising.  Follow.  Continue risk factor modification.

## 2016-04-22 DIAGNOSIS — L821 Other seborrheic keratosis: Secondary | ICD-10-CM | POA: Diagnosis not present

## 2016-04-30 ENCOUNTER — Other Ambulatory Visit: Payer: Self-pay | Admitting: *Deleted

## 2016-04-30 MED ORDER — ATORVASTATIN CALCIUM 20 MG PO TABS: 20.0000 mg | ORAL_TABLET | Freq: Every day | ORAL | 3 refills | Status: DC

## 2016-05-14 DIAGNOSIS — E291 Testicular hypofunction: Secondary | ICD-10-CM | POA: Diagnosis not present

## 2016-05-21 DIAGNOSIS — E291 Testicular hypofunction: Secondary | ICD-10-CM | POA: Diagnosis not present

## 2016-05-26 ENCOUNTER — Encounter: Payer: Self-pay | Admitting: Internal Medicine

## 2016-05-27 ENCOUNTER — Telehealth: Payer: Self-pay | Admitting: Cardiovascular Disease

## 2016-05-27 NOTE — Telephone Encounter (Signed)
I think around the 500-700 range should be safe Above that I do not think will be a major issue

## 2016-05-27 NOTE — Telephone Encounter (Signed)
Spoke w/ pt's wife. She reports that pt was recently switched from androgel to testosterone injection.  She is asking for Dr. Donivan Scull opinion on what a safe range would be for his testosterone level to be.  She reports that Dr. Jacqlyn Larsen advised that a safe range for a 61 y/o man is b/t 500-700, but pt's previous reading was much higher.  She would like for him to get the highest but safest dosage in order to get optimal results. Pt would like Dr. Rockey Situ to weigh in to make sure his heart is ok for this and what range he feels is safe for pt.

## 2016-05-27 NOTE — Telephone Encounter (Signed)
Pt wife states pt was recently started on Testosterone medication. Pt would like to know Dr. Donivan Scull opinion on what his testosterone level should be considering his cardiac history. Please call.

## 2016-05-28 DIAGNOSIS — E291 Testicular hypofunction: Secondary | ICD-10-CM | POA: Diagnosis not present

## 2016-05-28 NOTE — Telephone Encounter (Signed)
Left message on Kim's vm w/ Dr. Donivan Scull recommendation.  Asked her to call back w/ any further questions or concerns.

## 2016-06-02 DIAGNOSIS — N529 Male erectile dysfunction, unspecified: Secondary | ICD-10-CM | POA: Diagnosis not present

## 2016-06-02 DIAGNOSIS — E291 Testicular hypofunction: Secondary | ICD-10-CM | POA: Diagnosis not present

## 2016-06-02 DIAGNOSIS — Z6831 Body mass index (BMI) 31.0-31.9, adult: Secondary | ICD-10-CM | POA: Diagnosis not present

## 2016-06-11 DIAGNOSIS — F1220 Cannabis dependence, uncomplicated: Secondary | ICD-10-CM | POA: Diagnosis not present

## 2016-06-11 DIAGNOSIS — G4700 Insomnia, unspecified: Secondary | ICD-10-CM | POA: Diagnosis not present

## 2016-06-11 DIAGNOSIS — F3181 Bipolar II disorder: Secondary | ICD-10-CM | POA: Diagnosis not present

## 2016-06-26 DIAGNOSIS — G4733 Obstructive sleep apnea (adult) (pediatric): Secondary | ICD-10-CM | POA: Diagnosis not present

## 2016-06-30 ENCOUNTER — Other Ambulatory Visit: Payer: Self-pay | Admitting: Internal Medicine

## 2016-07-07 ENCOUNTER — Other Ambulatory Visit: Payer: Self-pay | Admitting: Internal Medicine

## 2016-08-05 ENCOUNTER — Other Ambulatory Visit: Payer: Self-pay | Admitting: Internal Medicine

## 2016-08-31 NOTE — Progress Notes (Signed)
Patient ID: Keith Alberico., male   DOB: Dec 20, 1955, 61 y.o.   MRN: [id number] Cardiology Office Note  Date:  09/04/2016   ID:  Keith Lemming., DOB 1955-11-06, MRN [id number]  PCP:  Einar Pheasant, MD   Chief Complaint  Patient presents with  . other    1 yr f/u no complaints today. Pt needs rx for nitro and wants to discuss valsartan recall. Meds reviewed verbally with pt.    HPI:  Keith Gross is a very pleasant 61 year old gentleman syncope in 2010 felt secondary to low blood pressure that improved with medication changes,  chest pain symptoms at the beginning of April 2013,  stress test showing inferior lateral hypoperfusion consistent with large region of scar which was new from 2010,  cardiac catheterization showing severe ramus disease, with DES stent placed in May 2013. catheterization September 2013 for chest pain showing patent stent, distal left circumflex disease noted, moderate in nature. He presents today for follow-up of his coronary artery disease  Rare episodes of chest pain  In the past 2 weeks Typically happens if he pushes himself hard Occasionally takes nitroglycerin, feels he needs a refill as nitroglycerin is old he is active, continues to work out 2 days with a Physiological scientist.  Denies any chest pain working out with trainer Does cardio and weightlifting Has periodic follow-up with psychiatry,  Previous problems with outbursts of anger  He is on valsartan , nervous about recall  EKG on today's visit shows normal sinus rhythm with rate 82 bpm no significant ST or T-wave changes  Other past medical history reviewed Previous surgery for ED with implant down place. Overall has healed well, happy with his decision. He is back at work, denies any significant chest pain concerning for angina. Has good energy Is wearing his CPAP for sleep apnea  Other past medical history He does have a previous history of smoking, quit in 1992. Father had his first  heart attack at age 4.  PMH:   has a past medical history of Angina; Anxiety; Diastasis of muscle (2011); Dysplastic polyp of colon (2008); GERD (gastroesophageal reflux disease); Gout; Hyperlipidemia; Hypertension; Kidney stones; Myocardial infarction [facility]) (05/2011); Sleep apnea; Syncopal episodes; and Treadmill stress test negative for angina pectoris (2011).  PSH:    Past Surgical History:  Procedure Laterality Date  . CARDIAC CATHETERIZATION  06/2011   "diagnostic"  . COLONOSCOPY    . CORONARY ANGIOPLASTY WITH STENT PLACEMENT  07/09/11   "1"  . HEMORRHOID SURGERY  2000  . [place]   left  . LEFT HEART CATHETERIZATION WITH CORONARY ANGIOGRAM N/A 10/21/2011   Procedure: LEFT HEART CATHETERIZATION WITH CORONARY ANGIOGRAM;  Surgeon: Hillary Bow, MD;  Location: [facility] CATH LAB;  Service: Cardiovascular;  Laterality: N/A;  . LITHOTRIPSY  10/2008  . PERCUTANEOUS CORONARY STENT INTERVENTION (PCI-S) N/A 07/09/2011   Procedure: PERCUTANEOUS CORONARY STENT INTERVENTION (PCI-S);  Surgeon: Sherren Mocha, MD;  Location: [facility] C[facility];  Service: Cardiovascular;  Laterality: N/A;  . UPPER GI ENDOSCOPY    . URETEROSCOPY  10/2009    Current Outpatient Prescriptions  Medication Sig Dispense Refill  . allopurinol (ZYLOPRIM) 100 MG tablet TAKE ONE TABLET BY MOUTH TWICE DAILY 180 tablet 1  . amLODipine (NORVASC) 10 MG tablet Take 1 tablet (10 mg total) by mouth daily. 90 tablet 3  . atorvastatin (LIPITOR) 20 MG tablet Take 1 tablet (20 mg total) by mouth daily. 90 tablet 3  . cetirizine (ZYRTEC) 10  MG tablet Take 10 mg by mouth daily.    Marland Kitchen CINNAMON PO Take 1,000 mg by mouth 2 (two) times daily.     . clopidogrel (PLAVIX) 75 MG tablet Take 1 tablet (75 mg total) by mouth daily. 90 tablet 3  . fluticasone (FLONASE) 50 MCG/ACT nasal spray Place 2 sprays into both nostrils daily. 48 g 3  . hydrochlorothiazide (HYDRODIURIL) 25 MG tablet TAKE ONE TABLET AT BEDTIME 90 tablet 3  . lamoTRIgine  (LAMICTAL) 200 MG tablet Take 200 mg by mouth daily.  0  . magnesium oxide (MAG-OX) 400 MG tablet Take 400 mg by mouth daily.    . nitroGLYCERIN (NITROSTAT) 0.4 MG SL tablet Place 1 tablet (0.4 mg total) under the tongue every 5 (five) minutes as needed for chest pain. 25 tablet 3  . pantoprazole (PROTONIX) 40 MG tablet TAKE ONE TABLET BY MOUTH EVERY DAY 90 tablet 1  . polyethylene glycol powder (GLYCOLAX/MIRALAX) powder TAKE 17 GRAMS BY MOUTH EVERY DAY 527 g 1  . potassium citrate (UROCIT-K) 10 MEQ (1080 MG) SR tablet TAKE ONE TABLET BY MOUTH TWICE DAILY 180 each 1  . Psyllium (METAMUCIL PO) Take by mouth.    . tamsulosin (FLOMAX) 0.4 MG CAPS Take 0.4 mg by mouth daily.    . TRAZODONE HCL PO Take 200 mg by mouth at bedtime.    . valsartan-hydrochlorothiazide (DIOVAN-HCT) 320-25 MG tablet Take 1 tablet by mouth daily. 90 tablet 3  . venlafaxine XR (EFFEXOR-XR) 150 MG 24 hr capsule Take 150 mg by mouth daily with breakfast.    . VITAMIN D, CHOLECALCIFEROL, PO Take 2,000 mg by mouth daily.    Marland Kitchen VITAMIN E PO Take 2,000 Units by mouth daily.     No current facility-administered medications for this visit.      Allergies:   Patient has no known allergies.   Social History:  The patient  reports that he quit smoking about 26 years ago. His smoking use included Cigarettes. He has a 10.00 pack-year smoking history. He has never used smokeless tobacco. He reports that he drinks about 2.4 oz of alcohol per week . He reports that he uses drugs, including Marijuana, about 7 times per week.   Family History:   family history includes Heart attack (age of onset: 76) in his father; Hypertension in his other.    Review of Systems: Review of Systems  Constitutional: Negative.   Respiratory: Negative.   Cardiovascular: Positive for chest pain.  Gastrointestinal: Negative.   Musculoskeletal: Negative.   Neurological: Negative.   Psychiatric/Behavioral: Negative.   All other systems reviewed and are  negative.    PHYSICAL EXAM: VS:  BP 120/62 (BP Location: Left Arm, Patient Position: Sitting, Cuff Size: Normal)   Pulse 82   Ht 6' (1.829 m)   Wt 229 lb (103.9 kg)   BMI 31.06 kg/m  , BMI Body mass index is 31.06 kg/m. GEN: Well nourished, well developed, in no acute distress HEENT: normal Neck: no JVD, carotid bruits, or masses Cardiac: RRR; no murmurs, rubs, or gallops,no edema  Respiratory:  clear to auscultation bilaterally, normal work of breathing GI: soft, nontender, nondistended, + BS MS: no deformity or atrophy Skin: warm and dry, no rash Neuro:  Strength and sensation are intact Psych: euthymic mood, full affect    Recent Labs: 03/20/2016: ALT 30; BUN 21; Creatinine, Ser 1.09; Hemoglobin 16.1; Platelets 160.0; Potassium 3.7; Sodium 138; TSH 3.66    Lipid Panel Lab Results  Component Value Date  CHOL 141 03/20/2016   HDL 29.50 (L) 03/20/2016   LDLCALC 79 03/20/2016   TRIG 160.0 (H) 03/20/2016      Wt Readings from Last 3 Encounters:  09/04/16 229 lb (103.9 kg)  03/25/16 232 lb 12.8 oz (105.6 kg)  09/17/15 229 lb 6 oz (104 kg)       ASSESSMENT AND PLAN:  Coronary artery disease due to calcified coronary lesion - Plan: EKG 12-Lead Rare episodes of chest pain, stable, relieved with nitroglycerin He does not want further testing at this time Recommended he call if symptoms become more frequent, last longer  Essential hypertension - Plan: EKG 12-Lead Blood pressure is well controlled on today's visit.  We will change the valsartan hctz to losartan HCTZ  stable angina (HCC) stable episodes relieved with nitroglycerin No further workup at this time  Hyperlipidemia  LDL above goal, recommended he watches diet and continue exercise program  Chest pressure Stable, symptoms as above  Anxiety Stable, recommended regular exercise program   Total encounter time more than 25 minutes  Greater than 50% was spent in counseling and coordination of care  with the patient   Disposition:   F/U  12 months   Orders Placed This Encounter  Procedures  . EKG 12-Lead     Signed, Esmond Plants, M.D., Ph.D. 09/04/2016  [place], [place]

## 2016-09-04 ENCOUNTER — Encounter: Payer: Self-pay | Admitting: Cardiovascular Disease

## 2016-09-04 ENCOUNTER — Ambulatory Visit (INDEPENDENT_AMBULATORY_CARE_PROVIDER_SITE_OTHER): Payer: BLUE CROSS/BLUE SHIELD | Admitting: Cardiovascular Disease

## 2016-09-04 VITALS — BP 120/62 | HR 82 | Ht 72.0 in | Wt 229.0 lb

## 2016-09-04 DIAGNOSIS — I25118 Atherosclerotic heart disease of native coronary artery with other forms of angina pectoris: Secondary | ICD-10-CM

## 2016-09-04 DIAGNOSIS — N401 Enlarged prostate with lower urinary tract symptoms: Secondary | ICD-10-CM | POA: Diagnosis not present

## 2016-09-04 DIAGNOSIS — E782 Mixed hyperlipidemia: Secondary | ICD-10-CM | POA: Diagnosis not present

## 2016-09-04 DIAGNOSIS — I208 Other forms of angina pectoris: Secondary | ICD-10-CM | POA: Diagnosis not present

## 2016-09-04 DIAGNOSIS — R55 Syncope and collapse: Secondary | ICD-10-CM

## 2016-09-04 DIAGNOSIS — N138 Other obstructive and reflux uropathy: Secondary | ICD-10-CM | POA: Diagnosis not present

## 2016-09-04 DIAGNOSIS — I2089 Other forms of angina pectoris: Secondary | ICD-10-CM | POA: Insufficient documentation

## 2016-09-04 DIAGNOSIS — Z79899 Other long term (current) drug therapy: Secondary | ICD-10-CM | POA: Diagnosis not present

## 2016-09-04 DIAGNOSIS — R972 Elevated prostate specific antigen [PSA]: Secondary | ICD-10-CM | POA: Diagnosis not present

## 2016-09-04 DIAGNOSIS — I10 Essential (primary) hypertension: Secondary | ICD-10-CM | POA: Diagnosis not present

## 2016-09-04 DIAGNOSIS — E291 Testicular hypofunction: Secondary | ICD-10-CM | POA: Diagnosis not present

## 2016-09-04 MED ORDER — NITROGLYCERIN 0.4 MG SL SUBL: 0.4000 mg | SUBLINGUAL_TABLET | SUBLINGUAL | 6 refills | Status: DC | PRN

## 2016-09-04 MED ORDER — LOSARTAN POTASSIUM-HCTZ 100-25 MG PO TABS: 1.0000 | ORAL_TABLET | Freq: Every day | ORAL | 11 refills | Status: DC

## 2016-09-04 NOTE — Patient Instructions (Addendum)
Medication Instructions:   Stop the valsartan Start losartan one a day  Labwork:  No new labs needed  Testing/Procedures:  No further testing at this time   Follow-Up: It was a pleasure seeing you in the office today. Please call us if you have new issues that need to be addressed before your next appt.  [phone]  Your physician wants you to follow-up in: 12 months.    If you need a refill on your cardiac medications before your next appointment, please call your pharmacy.

## 2016-09-09 DIAGNOSIS — F1220 Cannabis dependence, uncomplicated: Secondary | ICD-10-CM | POA: Diagnosis not present

## 2016-09-09 DIAGNOSIS — G4700 Insomnia, unspecified: Secondary | ICD-10-CM | POA: Diagnosis not present

## 2016-09-09 DIAGNOSIS — F3181 Bipolar II disorder: Secondary | ICD-10-CM | POA: Diagnosis not present

## 2016-09-23 ENCOUNTER — Other Ambulatory Visit (INDEPENDENT_AMBULATORY_CARE_PROVIDER_SITE_OTHER): Payer: BLUE CROSS/BLUE SHIELD

## 2016-09-23 ENCOUNTER — Encounter: Payer: Self-pay | Admitting: Internal Medicine

## 2016-09-23 DIAGNOSIS — R739 Hyperglycemia, unspecified: Secondary | ICD-10-CM | POA: Diagnosis not present

## 2016-09-23 DIAGNOSIS — E785 Hyperlipidemia, unspecified: Secondary | ICD-10-CM

## 2016-09-23 DIAGNOSIS — I10 Essential (primary) hypertension: Secondary | ICD-10-CM

## 2016-09-23 LAB — LIPID PANEL
Cholesterol: 118 mg/dL (ref 0–200)
HDL: 26.4 mg/dL — AB (ref >39.00)
NonHDL: 91.66
TRIGLYCERIDES: 208 mg/dL — AB (ref 0.0–149.0)
Total CHOL/HDL Ratio: 4
VLDL: 41.6 mg/dL — ABNORMAL HIGH (ref 0.0–40.0)

## 2016-09-23 LAB — BASIC METABOLIC PANEL
BUN: 22 mg/dL (ref 6–23)
CALCIUM: 9.4 mg/dL (ref 8.4–10.5)
CO2: 32 meq/L (ref 19–32)
CREATININE: 1.16 mg/dL (ref 0.40–1.50)
Chloride: 100 mEq/L (ref 96–112)
GFR: 68.23 mL/min (ref >60.00)
Glucose, Bld: 182 mg/dL — ABNORMAL HIGH (ref 70–99)
Potassium: 3.8 mEq/L (ref 3.5–5.1)
Sodium: 139 mEq/L (ref 135–145)

## 2016-09-23 LAB — HEPATIC FUNCTION PANEL
ALK PHOS: 38 U/L — AB (ref 39–117)
ALT: 30 U/L (ref 0–53)
AST: 24 U/L (ref 0–37)
Albumin: 4.5 g/dL (ref 3.5–5.2)
BILIRUBIN TOTAL: 0.5 mg/dL (ref 0.2–1.2)
Bilirubin, Direct: 0.1 mg/dL (ref 0.0–0.3)
Total Protein: 6.9 g/dL (ref 6.0–8.3)

## 2016-09-23 LAB — LDL CHOLESTEROL, DIRECT: Direct LDL: 73 mg/dL

## 2016-09-23 LAB — HEMOGLOBIN A1C: HEMOGLOBIN A1C: 6.6 % — AB (ref 4.6–6.5)

## 2016-09-24 ENCOUNTER — Ambulatory Visit: Payer: BLUE CROSS/BLUE SHIELD | Admitting: Internal Medicine

## 2016-09-25 ENCOUNTER — Encounter: Payer: Self-pay | Admitting: Internal Medicine

## 2016-09-25 ENCOUNTER — Ambulatory Visit (INDEPENDENT_AMBULATORY_CARE_PROVIDER_SITE_OTHER): Payer: BLUE CROSS/BLUE SHIELD | Admitting: Internal Medicine

## 2016-09-25 DIAGNOSIS — G4730 Sleep apnea, unspecified: Secondary | ICD-10-CM

## 2016-09-25 DIAGNOSIS — I200 Unstable angina: Secondary | ICD-10-CM

## 2016-09-25 DIAGNOSIS — R945 Abnormal results of liver function studies: Secondary | ICD-10-CM

## 2016-09-25 DIAGNOSIS — R739 Hyperglycemia, unspecified: Secondary | ICD-10-CM

## 2016-09-25 DIAGNOSIS — I25118 Atherosclerotic heart disease of native coronary artery with other forms of angina pectoris: Secondary | ICD-10-CM

## 2016-09-25 DIAGNOSIS — E1159 Type 2 diabetes mellitus with other circulatory complications: Secondary | ICD-10-CM | POA: Diagnosis not present

## 2016-09-25 DIAGNOSIS — K2100 Gastro-esophageal reflux disease with esophagitis, without bleeding: Secondary | ICD-10-CM

## 2016-09-25 DIAGNOSIS — D696 Thrombocytopenia, unspecified: Secondary | ICD-10-CM | POA: Diagnosis not present

## 2016-09-25 DIAGNOSIS — K7689 Other specified diseases of liver: Secondary | ICD-10-CM

## 2016-09-25 DIAGNOSIS — I10 Essential (primary) hypertension: Secondary | ICD-10-CM

## 2016-09-25 DIAGNOSIS — E782 Mixed hyperlipidemia: Secondary | ICD-10-CM | POA: Diagnosis not present

## 2016-09-25 DIAGNOSIS — F419 Anxiety disorder, unspecified: Secondary | ICD-10-CM

## 2016-09-25 NOTE — Progress Notes (Signed)
Patient ID: Keith Trotta., male   DOB: 1956/01/13, 61 y.o.   MRN: [id number]   Subjective:    Patient ID: Keith Haque., male    DOB: February 08, 1956, 61 y.o.   MRN: [id number]  HPI  Patient here for a scheduled follow up.  He reports increased stress - family and business stress.  Discussed with him today.  Seeing Dr Nicolasa Ducking.  Doing relatively well on current medication regimen.  Does not feel needs any further intervention at this time.  Sees Dr Rockey Situ.  Last evaluated 09/04/16.  diovan/hctz was changed to losartan/hctz.  Reports no change in cardiac symptoms.  No sob.  Still exercising.  No abdominal pain.  Bowels moving.  Sees Dr Jacqlyn Larsen.  Receiving testosterone injections.     Past Medical History:  Diagnosis Date  . Angina   . Anxiety   . Diastasis of muscle 2011   "abdominal"  . Dysplastic polyp of colon 2008   [facility]  . GERD (gastroesophageal reflux disease)   . Gout    Multiple episodes  . Hyperlipidemia   . Hypertension   . Kidney stones   . Myocardial infarction [facility]) 05/2011   "we think";  06/2011 abnl mv;  06/2011 Cath - subtotal occlusion of Ramus, otw nonobs dzs;  06/2011 PCI/DES of Ramus w/ 2.25x19m Resolute DES   . Sleep apnea    on CPAP  . Syncopal episodes   . Treadmill stress test negative for angina pectoris 2011   Past Surgical History:  Procedure Laterality Date  . CARDIAC CATHETERIZATION  06/2011   "diagnostic"  . COLONOSCOPY    . CORONARY ANGIOPLASTY WITH STENT PLACEMENT  07/09/11   "1"  . HEMORRHOID SURGERY  2000  . [place]  left  . LEFT HEART CATHETERIZATION WITH CORONARY ANGIOGRAM N/A 10/21/2011   Procedure: LEFT HEART CATHETERIZATION WITH CORONARY ANGIOGRAM;  Surgeon: THillary Bow MD;  [facility];  Service: Cardiovascular;  Laterality: N/A;  . LITHOTRIPSY  10/2008  . PERCUTANEOUS CORONARY STENT INTERVENTION (PCI-S) N/A 07/09/2011   Procedure: PERCUTANEOUS CORONARY STENT INTERVENTION (PCI-S);  Surgeon: MSherren Mocha MD;   [facility];  Service: Cardiovascular;  Laterality: N/A;  . UPPER GI ENDOSCOPY    . URETEROSCOPY  10/2009   Family History  Problem Relation Age of Onset  . Heart attack Father 457      LVAD  . Hypertension Other    Social History   Social History  . Marital status: Single    Spouse name: N/A  . Number of children: 4  . Years of education: N/A   Occupational History  . Health insurance broker    Social History Main Topics  . Smoking status: Former Smoker    Packs/day: 0.50    Years: 20.00    Types: Cigarettes    Quit date: 02/17/1990  . Smokeless tobacco: Never Used  . Alcohol use 2.4 oz/week    2 Glasses of wine, 2 Cans of beer per week  . Drug use: Yes    Frequency: 7.0 times per week    Types: Marijuana     Comment: 07/09/11 "lets say a cigarette a day"  . Sexual activity: Yes   Other Topics Concern  . None   Social History Narrative   Married and has 4 children.      Outpatient Encounter Prescriptions as of 09/25/2016  Medication Sig  . allopurinol (ZYLOPRIM) 100 MG tablet TAKE ONE TABLET BY  MOUTH TWICE DAILY  . amLODipine (NORVASC) 10 MG tablet Take 1 tablet (10 mg total) by mouth daily.  Marland Kitchen atorvastatin (LIPITOR) 20 MG tablet Take 1 tablet (20 mg total) by mouth daily.  . cetirizine (ZYRTEC) 10 MG tablet Take 10 mg by mouth daily.  Marland Kitchen CINNAMON PO Take 1,000 mg by mouth 2 (two) times daily.   . clopidogrel (PLAVIX) 75 MG tablet Take 1 tablet (75 mg total) by mouth daily.  . fluticasone (FLONASE) 50 MCG/ACT nasal spray Place 2 sprays into both nostrils daily.  . hydrochlorothiazide (HYDRODIURIL) 25 MG tablet TAKE ONE TABLET AT BEDTIME  . hydrOXYzine (ATARAX/VISTARIL) 50 MG tablet Take 50 mg by mouth 2 (two) times daily.  Marland Kitchen lamoTRIgine (LAMICTAL) 200 MG tablet Take 200 mg by mouth daily.  Marland Kitchen losartan-hydrochlorothiazide (HYZAAR) 100-25 MG tablet Take 1 tablet by mouth daily.  . magnesium oxide (MAG-OX) 400 MG tablet Take 400 mg by mouth daily.  .  nitroGLYCERIN (NITROSTAT) 0.4 MG SL tablet Place 1 tablet (0.4 mg total) under the tongue every 5 (five) minutes as needed for chest pain.  . pantoprazole (PROTONIX) 40 MG tablet TAKE ONE TABLET BY MOUTH EVERY DAY  . polyethylene glycol powder (GLYCOLAX/MIRALAX) powder TAKE 17 GRAMS BY MOUTH EVERY DAY  . potassium citrate (UROCIT-K) 10 MEQ (1080 MG) SR tablet TAKE ONE TABLET BY MOUTH TWICE DAILY  . Psyllium (METAMUCIL PO) Take by mouth.  . tamsulosin (FLOMAX) 0.4 MG CAPS Take 0.4 mg by mouth daily.  . TRAZODONE HCL PO Take 200 mg by mouth at bedtime.  Marland Kitchen venlafaxine XR (EFFEXOR-XR) 150 MG 24 hr capsule Take 150 mg by mouth daily with breakfast.  . VITAMIN D, CHOLECALCIFEROL, PO Take 2,000 mg by mouth daily.  Marland Kitchen VITAMIN E PO Take 2,000 Units by mouth daily.   No facility-administered encounter medications on file as of 09/25/2016.     Review of Systems  Constitutional: Negative for appetite change and unexpected weight change.  HENT: Negative for congestion and sinus pressure.   Respiratory: Negative for cough, chest tightness and shortness of breath.   Cardiovascular: Negative for chest pain, palpitations and leg swelling.  Gastrointestinal: Negative for abdominal pain, diarrhea, nausea and vomiting.  Genitourinary: Negative for difficulty urinating and dysuria.  Musculoskeletal: Negative for joint swelling and myalgias.  Skin: Negative for color change and rash.  Neurological: Negative for dizziness, light-headedness and headaches.  Psychiatric/Behavioral: Negative for dysphoric mood.       Increased stress as outlined.         Objective:     Blood pressure rechecked by me:  130/76  Physical Exam  Constitutional: He appears well-developed and well-nourished. No distress.  HENT:  Nose: Nose normal.  Mouth/Throat: Oropharynx is clear and moist.  Neck: Neck supple. No thyromegaly present.  Cardiovascular: Normal rate and regular rhythm.   Pulmonary/Chest: Effort normal and breath  sounds normal. No respiratory distress.  Abdominal: Soft. Bowel sounds are normal. There is no tenderness.  Musculoskeletal: He exhibits no edema or tenderness.  Lymphadenopathy:    He has no cervical adenopathy.  Skin: No rash noted. No erythema.  Psychiatric: He has a normal mood and affect. His behavior is normal.    BP 130/64 (BP Location: Left Arm, Patient Position: Sitting, Cuff Size: Normal)   Pulse 83   Temp 98.6 F (37 C) (Oral)   Resp 12   Ht 6' (1.829 m)   Wt 233 lb 3.2 oz (105.8 kg)   SpO2 98%   BMI 31.63  kg/m  Wt Readings from Last 3 Encounters:  09/25/16 233 lb 3.2 oz (105.8 kg)  09/04/16 229 lb (103.9 kg)  03/25/16 232 lb 12.8 oz (105.6 kg)     Lab Results  Component Value Date   WBC 8.7 03/20/2016   HGB 16.1 03/20/2016   HCT 46.6 03/20/2016   PLT 160.0 03/20/2016   GLUCOSE 182 (H) 09/23/2016   CHOL 118 09/23/2016   TRIG 208.0 (H) 09/23/2016   HDL 26.40 (L) 09/23/2016   LDLDIRECT 73.0 09/23/2016   LDLCALC 79 03/20/2016   ALT 30 09/23/2016   AST 24 09/23/2016   NA 139 09/23/2016   K 3.8 09/23/2016   CL 100 09/23/2016   CREATININE 1.16 09/23/2016   BUN 22 09/23/2016   CO2 32 09/23/2016   TSH 3.66 03/20/2016   INR 1.11 10/19/2011   HGBA1C 6.6 (H) 09/23/2016        Assessment & Plan:   Problem List Items Addressed This Visit    Abnormal liver function    Follow liver panel.  Low carb diet, exercise and weight loss.        Anxiety    Followed by Dr Nicolasa Ducking.  Discussed with him today.  Overall doing relatively well on current medication regimen.  Continue f/u with Dr Nicolasa Ducking.        Relevant Medications   hydrOXYzine (ATARAX/VISTARIL) 50 MG tablet   CAD (coronary artery disease)    Followed by Dr Rockey Situ.  Just evaluated 09/04/16.  Stable.  Continue risk factor modification.        Diabetes mellitus with cardiac complication (HCC)    Recent a1c 6.6.  Discussed with him today.  Discussed diagnosis of diabetes.  Will try diet adjustment to see  if can get better control.  Hold on medication at this time.  Follow met b and a1c.  Discussed diet and weight loss.        Relevant Orders   Hemoglobin Z6X   Basic metabolic panel   Microalbumin / creatinine urine ratio   GERD (gastroesophageal reflux disease)    Controlled on protonix.        Hyperglycemia    Low carb diet and exercise.  Follow met b and a1c.  [facility] lipid diet discussed.        Hyperlipidemia    Low cholesterol diet and exercise.  Follow lipid panel.  On lipitor.        Relevant Orders   Hepatic function panel   Lipid panel   Hypertension    Blood pressure under good control.  Continue same medication regimen.  Dr Rockey Situ wrote rx to change valsartan to losartan.  Follow pressures with change.   Follow metabolic panel.        Sleep apnea    CPAP.       Thrombocytopenia (HCC)    Last platelet count wnl.        Unstable angina ([place])    Followed by cardiology.  Just evaluated 09/04/16.  Stable.  Continue risk factor modification.            Einar Pheasant, MD

## 2016-09-28 ENCOUNTER — Encounter: Payer: Self-pay | Admitting: Internal Medicine

## 2016-09-28 DIAGNOSIS — E1159 Type 2 diabetes mellitus with other circulatory complications: Secondary | ICD-10-CM | POA: Insufficient documentation

## 2016-09-28 NOTE — Assessment & Plan Note (Signed)
Low carb diet and exercise.  Follow met b and a1c.  [facility] lipid diet discussed.

## 2016-09-28 NOTE — Assessment & Plan Note (Addendum)
Blood pressure under good control.  Continue same medication regimen.  Dr Rockey Situ wrote rx to change valsartan to losartan.  Follow pressures with change.   Follow metabolic panel.

## 2016-09-28 NOTE — Assessment & Plan Note (Signed)
Followed by cardiology.  Just evaluated 09/04/16.  Stable.  Continue risk factor modification.

## 2016-09-28 NOTE — Assessment & Plan Note (Signed)
Last platelet count wnl.   

## 2016-09-28 NOTE — Assessment & Plan Note (Signed)
Follow liver panel.  Low carb diet, exercise and weight loss.

## 2016-09-28 NOTE — Assessment & Plan Note (Signed)
Controlled on protonix.   

## 2016-09-28 NOTE — Assessment & Plan Note (Signed)
Followed by Dr Rockey Situ.  Just evaluated 09/04/16.  Stable.  Continue risk factor modification.

## 2016-09-28 NOTE — Assessment & Plan Note (Signed)
Low cholesterol diet and exercise.  Follow lipid panel.  On lipitor.

## 2016-09-28 NOTE — Assessment & Plan Note (Signed)
CPAP.  

## 2016-09-28 NOTE — Assessment & Plan Note (Signed)
Recent a1c 6.6.  Discussed with him today.  Discussed diagnosis of diabetes.  Will try diet adjustment to see if can get better control.  Hold on medication at this time.  Follow met b and a1c.  Discussed diet and weight loss.

## 2016-09-28 NOTE — Assessment & Plan Note (Signed)
Followed by Dr Nicolasa Ducking.  Discussed with him today.  Overall doing relatively well on current medication regimen.  Continue f/u with Dr Nicolasa Ducking.

## 2016-10-04 ENCOUNTER — Other Ambulatory Visit: Payer: Self-pay | Admitting: Internal Medicine

## 2016-10-04 ENCOUNTER — Other Ambulatory Visit: Payer: Self-pay | Admitting: Cardiovascular Disease

## 2016-10-27 ENCOUNTER — Other Ambulatory Visit: Payer: Self-pay | Admitting: Internal Medicine

## 2016-11-07 DIAGNOSIS — N138 Other obstructive and reflux uropathy: Secondary | ICD-10-CM | POA: Diagnosis not present

## 2016-11-07 DIAGNOSIS — Z79899 Other long term (current) drug therapy: Secondary | ICD-10-CM | POA: Diagnosis not present

## 2016-11-07 DIAGNOSIS — E291 Testicular hypofunction: Secondary | ICD-10-CM | POA: Diagnosis not present

## 2016-11-07 DIAGNOSIS — N200 Calculus of kidney: Secondary | ICD-10-CM | POA: Diagnosis not present

## 2016-11-07 DIAGNOSIS — N401 Enlarged prostate with lower urinary tract symptoms: Secondary | ICD-10-CM | POA: Diagnosis not present

## 2016-11-07 DIAGNOSIS — R972 Elevated prostate specific antigen [PSA]: Secondary | ICD-10-CM | POA: Diagnosis not present

## 2016-11-10 ENCOUNTER — Other Ambulatory Visit: Payer: Self-pay

## 2016-11-10 ENCOUNTER — Telehealth: Payer: Self-pay | Admitting: Cardiovascular Disease

## 2016-11-10 DIAGNOSIS — F1220 Cannabis dependence, uncomplicated: Secondary | ICD-10-CM | POA: Diagnosis not present

## 2016-11-10 DIAGNOSIS — G4700 Insomnia, unspecified: Secondary | ICD-10-CM | POA: Diagnosis not present

## 2016-11-10 DIAGNOSIS — F3181 Bipolar II disorder: Secondary | ICD-10-CM | POA: Diagnosis not present

## 2016-11-10 MED ORDER — OLMESARTAN MEDOXOMIL-HCTZ 40-25 MG PO TABS: 1.0000 | ORAL_TABLET | Freq: Every day | ORAL | 5 refills | Status: DC

## 2016-11-10 NOTE — Telephone Encounter (Signed)
PT was taking valsartan but was switched to losartan due to recall Pt would prefer to switch back to valsartan than take losartan If possible to go back to valsartan, pt would like 90 day refill Please call to advise

## 2016-11-10 NOTE — Progress Notes (Unsigned)
I'm rea

## 2016-11-10 NOTE — Telephone Encounter (Signed)
Reviewed Dr. Donivan Scull recommendations w/pt who is agreeable. Olmesartan HCTZ sent to [facility]. Losartan HCTZ discontinued. Pt understands to continue to monitor BP and call if BP is not improved.

## 2016-11-10 NOTE — Telephone Encounter (Signed)
Pt's valsartan - HCTZ was switched to losartan - HCTZ in July due to recall. Dr. Rockey Situ advised pt to monitor BP and call if new medication was not controlling BP. He reports BP 160s/80s and would like to switch back.  S/w Dr. Rockey Situ who instructs pt to stop losartan-HCTZ and start olmesartan HCTZ 40/25mg  qd. Left message on pt's cell VM to contact the office.

## 2016-12-11 ENCOUNTER — Other Ambulatory Visit: Payer: Self-pay | Admitting: Internal Medicine

## 2016-12-27 ENCOUNTER — Other Ambulatory Visit: Payer: Self-pay | Admitting: Cardiovascular Disease

## 2017-01-06 ENCOUNTER — Other Ambulatory Visit: Payer: Self-pay | Admitting: Internal Medicine

## 2017-01-12 ENCOUNTER — Other Ambulatory Visit (INDEPENDENT_AMBULATORY_CARE_PROVIDER_SITE_OTHER): Payer: BLUE CROSS/BLUE SHIELD

## 2017-01-12 DIAGNOSIS — E1159 Type 2 diabetes mellitus with other circulatory complications: Secondary | ICD-10-CM | POA: Diagnosis not present

## 2017-01-12 DIAGNOSIS — E782 Mixed hyperlipidemia: Secondary | ICD-10-CM | POA: Diagnosis not present

## 2017-01-12 LAB — LIPID PANEL
CHOLESTEROL: 118 mg/dL (ref 0–200)
HDL: 29.7 mg/dL — AB (ref >39.00)
LDL Cholesterol: 51 mg/dL (ref 0–99)
NonHDL: 88.23
Total CHOL/HDL Ratio: 4
Triglycerides: 188 mg/dL — ABNORMAL HIGH (ref 0.0–149.0)
VLDL: 37.6 mg/dL (ref 0.0–40.0)

## 2017-01-12 LAB — HEPATIC FUNCTION PANEL
ALBUMIN: 4.5 g/dL (ref 3.5–5.2)
ALT: 28 U/L (ref 0–53)
AST: 19 U/L (ref 0–37)
Alkaline Phosphatase: 38 U/L — ABNORMAL LOW (ref 39–117)
Bilirubin, Direct: 0.1 mg/dL (ref 0.0–0.3)
TOTAL PROTEIN: 6.9 g/dL (ref 6.0–8.3)
Total Bilirubin: 0.4 mg/dL (ref 0.2–1.2)

## 2017-01-12 LAB — BASIC METABOLIC PANEL
BUN: 19 mg/dL (ref 6–23)
CO2: 34 mEq/L — ABNORMAL HIGH (ref 19–32)
Calcium: 9.7 mg/dL (ref 8.4–10.5)
Chloride: 101 mEq/L (ref 96–112)
Creatinine, Ser: 1.14 mg/dL (ref 0.40–1.50)
GFR: 69.54 mL/min (ref >60.00)
Glucose, Bld: 229 mg/dL — ABNORMAL HIGH (ref 70–99)
POTASSIUM: 3.7 meq/L (ref 3.5–5.1)
SODIUM: 139 meq/L (ref 135–145)

## 2017-01-12 LAB — HEMOGLOBIN A1C: HEMOGLOBIN A1C: 6 % (ref 4.6–6.5)

## 2017-01-12 LAB — MICROALBUMIN / CREATININE URINE RATIO
Creatinine,U: 117.8 mg/dL
Microalb Creat Ratio: 0.6 mg/g (ref 0.0–30.0)
Microalb, Ur: <0.7 mg/dL (ref 0.0–1.9)

## 2017-01-13 ENCOUNTER — Encounter: Payer: Self-pay | Admitting: Internal Medicine

## 2017-01-14 ENCOUNTER — Ambulatory Visit: Payer: BLUE CROSS/BLUE SHIELD | Admitting: Internal Medicine

## 2017-01-14 ENCOUNTER — Encounter: Payer: Self-pay | Admitting: Internal Medicine

## 2017-01-14 VITALS — BP 136/78 | HR 95 | Temp 99.0°F | Ht 72.0 in | Wt 229.8 lb

## 2017-01-14 DIAGNOSIS — R945 Abnormal results of liver function studies: Secondary | ICD-10-CM

## 2017-01-14 DIAGNOSIS — R739 Hyperglycemia, unspecified: Secondary | ICD-10-CM | POA: Diagnosis not present

## 2017-01-14 DIAGNOSIS — E782 Mixed hyperlipidemia: Secondary | ICD-10-CM

## 2017-01-14 DIAGNOSIS — M25512 Pain in left shoulder: Secondary | ICD-10-CM

## 2017-01-14 DIAGNOSIS — K7689 Other specified diseases of liver: Secondary | ICD-10-CM | POA: Diagnosis not present

## 2017-01-14 DIAGNOSIS — I10 Essential (primary) hypertension: Secondary | ICD-10-CM | POA: Diagnosis not present

## 2017-01-14 DIAGNOSIS — E1159 Type 2 diabetes mellitus with other circulatory complications: Secondary | ICD-10-CM | POA: Diagnosis not present

## 2017-01-14 DIAGNOSIS — F419 Anxiety disorder, unspecified: Secondary | ICD-10-CM

## 2017-01-14 DIAGNOSIS — G4730 Sleep apnea, unspecified: Secondary | ICD-10-CM

## 2017-01-14 DIAGNOSIS — K2100 Gastro-esophageal reflux disease with esophagitis, without bleeding: Secondary | ICD-10-CM

## 2017-01-14 DIAGNOSIS — I25118 Atherosclerotic heart disease of native coronary artery with other forms of angina pectoris: Secondary | ICD-10-CM

## 2017-01-14 DIAGNOSIS — Z23 Encounter for immunization: Secondary | ICD-10-CM

## 2017-01-14 MED ORDER — POLYETHYLENE GLYCOL 3350 17 GM/SCOOP PO POWD: ORAL | 1 refills | Status: AC

## 2017-01-14 NOTE — Progress Notes (Signed)
Pre visit review using our clinic review tool, if applicable. No additional management support is needed unless otherwise documented below in the visit note. 

## 2017-01-14 NOTE — Progress Notes (Signed)
Patient ID: Keith Housman., male   DOB: Jul 13, 1955, 60 y.o.   MRN: [id number]   Subjective:    Patient ID: Keith Mele., male    DOB: [birth year], 61 y.o.   MRN: [id number]  HPI  Patient here for a scheduled follow up.  He has been under increased stress recently with work and his wife's health issues.  Overall he feels he is doing relatively well. Seeing Dr Nicolasa Ducking.  Feels this medication regimen is working.  Sees cardiology.  Stable.  No chest pain.  No sob.  No acid reflux.  No abdominal pain.  Bowels moving.  Sees Dr Jacqlyn Larsen.  Receiving testosterone injections.  Felt stable.  Recommended 6 month f/u.  He is having problems with his left shoulder/left upper arm.  Noticed popping sound and has had pain.  Appears to have biceps tendon rupture.  Request referral back to ortho.     Past Medical History:  Diagnosis Date  . Angina   . Anxiety   . Diastasis of muscle 2011   "abdominal"  . Dysplastic polyp of colon 2008   [facility]  . GERD (gastroesophageal reflux disease)   . Gout    Multiple episodes  . Hyperlipidemia   . Hypertension   . Kidney stones   . Myocardial infarction [facility]) 05/2011   "we think";  06/2011 abnl mv;  06/2011 Cath - subtotal occlusion of Ramus, otw nonobs dzs;  06/2011 PCI/DES of Ramus w/ 2.25x37m Resolute DES   . Sleep apnea    on CPAP  . Syncopal episodes   . Treadmill stress test negative for angina pectoris 2011   Past Surgical History:  Procedure Laterality Date  . CARDIAC CATHETERIZATION  06/2011   "diagnostic"  . COLONOSCOPY    . CORONARY ANGIOPLASTY WITH STENT PLACEMENT  07/09/11   "1"  . HEMORRHOID SURGERY  2000  . [place]  . LEFT HEART CATHETERIZATION WITH CORONARY ANGIOGRAM N/A 10/21/2011   Procedure: LEFT HEART CATHETERIZATION WITH CORONARY ANGIOGRAM;  Surgeon: THillary Bow MD;  [facility];  Service: Cardiovascular;  Laterality: N/A;  . LITHOTRIPSY  10/2008  . PERCUTANEOUS CORONARY STENT INTERVENTION (PCI-S) N/A  07/09/2011   Procedure: PERCUTANEOUS CORONARY STENT INTERVENTION (PCI-S);  Surgeon: MSherren Mocha MD;  [facility];  Service: Cardiovascular;  Laterality: N/A;  . UPPER GI ENDOSCOPY    . URETEROSCOPY  10/2009   Family History  Problem Relation Age of Onset  . Heart attack Father 495      LVAD  . Hypertension Other    Social History   Socioeconomic History  . Marital status: Single    Spouse name: None  . Number of children: 4  . Years of education: None  . Highest education level: None  Social Needs  . Financial resource strain: None  . Food insecurity - worry: None  . Food insecurity - inability: None  . Transportation needs - medical: None  . Transportation needs - non-medical: None  Occupational History  . 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 Smoking status: Former Smoker    Packs/day: 0.50    Years: 20.00    Pack years: 10.00    Types: Cigarettes    Last attempt to quit: 02/17/1990    Years since quitting: 26.9  . Smokeless tobacco: Never Used  Substance and Sexual Activity  . Alcohol use: Yes    Alcohol/week: 2.4 oz    Types:  2 Glasses of wine, 2 Cans of beer per week  . Drug use: Yes    Frequency: 7.0 times per week    Types: Marijuana    Comment: 07/09/11 "lets say a cigarette a day"  . Sexual activity: Yes  Other Topics Concern  . None  Social History Narrative   Married and has 4 children.      Outpatient Encounter Medications as of 01/14/2017  Medication Sig  . allopurinol (ZYLOPRIM) 100 MG tablet TAKE ONE TABLET BY MOUTH TWICE DAILY  . allopurinol (ZYLOPRIM) 100 MG tablet TAKE 1 TABLET BY MOUTH TWICE DAILY  . amLODipine (NORVASC) 10 MG tablet TAKE ONE TABLET BY MOUTH DAILY  . atorvastatin (LIPITOR) 20 MG tablet Take 1 tablet (20 mg total) by mouth daily.  . cetirizine (ZYRTEC) 10 MG tablet Take 10 mg by mouth daily.  Marland Kitchen CINNAMON PO Take 1,000 mg by mouth 2 (two) times daily.   . clopidogrel (PLAVIX) 75 MG tablet TAKE ONE  TABLET BY MOUTH EVERY DAY  . fluticasone (FLONASE) 50 MCG/ACT nasal spray Place 2 sprays into both nostrils daily.  . fluticasone (FLONASE) 50 MCG/ACT nasal spray USE 2 SPRAYS IN EACH NOSTRIL DAILY  . hydrochlorothiazide (HYDRODIURIL) 25 MG tablet TAKE ONE TABLET BY MOUTH AT BEDTIME  . hydrOXYzine (ATARAX/VISTARIL) 50 MG tablet Take 50 mg by mouth 2 (two) times daily.  Marland Kitchen lamoTRIgine (LAMICTAL) 200 MG tablet Take 200 mg by mouth daily.  . magnesium oxide (MAG-OX) 400 MG tablet Take 400 mg by mouth daily.  . nitroGLYCERIN (NITROSTAT) 0.4 MG SL tablet Place 1 tablet (0.4 mg total) under the tongue every 5 (five) minutes as needed for chest pain.  Marland Kitchen olmesartan-hydrochlorothiazide (BENICAR HCT) 40-25 MG tablet Take 1 tablet by mouth daily.  . pantoprazole (PROTONIX) 40 MG tablet TAKE ONE TABLET BY MOUTH EVERY DAY  . polyethylene glycol powder (GLYCOLAX/MIRALAX) powder TAKE 17 GRAMS BY MOUTH EVERY DAY as needed (as directed)  . potassium citrate (UROCIT-K) 10 MEQ (1080 MG) SR tablet TAKE ONE TABLET BY MOUTH TWICE DAILY  . Psyllium (METAMUCIL PO) Take by mouth.  . tamsulosin (FLOMAX) 0.4 MG CAPS Take 0.4 mg by mouth daily.  . TRAZODONE HCL PO Take 200 mg by mouth at bedtime.  Marland Kitchen venlafaxine XR (EFFEXOR-XR) 150 MG 24 hr capsule Take 150 mg by mouth daily with breakfast.  . VITAMIN D, CHOLECALCIFEROL, PO Take 2,000 mg by mouth daily.  Marland Kitchen VITAMIN E PO Take 2,000 Units by mouth daily.  . [DISCONTINUED] polyethylene glycol powder (GLYCOLAX/MIRALAX) powder TAKE 17 GRAMS BY MOUTH EVERY DAY   No facility-administered encounter medications on file as of 01/14/2017.     Review of Systems  Constitutional: Negative for appetite change and unexpected weight change.  HENT: Negative for congestion and sinus pressure.   Respiratory: Negative for cough, chest tightness and shortness of breath.   Cardiovascular: Negative for chest pain, palpitations and leg swelling.  Gastrointestinal: Negative for abdominal pain,  diarrhea, nausea and vomiting.  Genitourinary: Negative for difficulty urinating and dysuria.  Musculoskeletal: Negative for myalgias.       Left shoulder pain as outlined.    Skin: Negative for color change and rash.  Neurological: Negative for dizziness, light-headedness and headaches.  Psychiatric/Behavioral: Negative for agitation and dysphoric mood.       Increased stress as outlined.         Objective:     Blood pressure rechecked by me:  128/72  Physical Exam  Constitutional: He appears well-developed and  well-nourished. No distress.  HENT:  Nose: Nose normal.  Mouth/Throat: Oropharynx is clear and moist.  Neck: Neck supple. No thyromegaly present.  Cardiovascular: Normal rate and regular rhythm.  Pulmonary/Chest: Effort normal and breath sounds normal. No respiratory distress.  Abdominal: Soft. Bowel sounds are normal. There is no tenderness.  Musculoskeletal: He exhibits no edema or tenderness.  Firm raised area - left upper arm - appears to be c/w biceps tendon rupture.  Increased pian with palpation and with full extension of arm.    Lymphadenopathy:    He has no cervical adenopathy.  Skin: No rash noted. He is not diaphoretic. No erythema.  Psychiatric: He has a normal mood and affect. His behavior is normal.    BP 136/78   Pulse 95   Temp 99 F (37.2 C) (Oral)   Ht 6' (1.829 m)   Wt 229 lb 12.8 oz (104.2 kg)   SpO2 95%   BMI 31.17 kg/m  Wt Readings from Last 3 Encounters:  01/14/17 229 lb 12.8 oz (104.2 kg)  09/25/16 233 lb 3.2 oz (105.8 kg)  09/04/16 229 lb (103.9 kg)     Lab Results  Component Value Date   WBC 8.7 03/20/2016   HGB 16.1 03/20/2016   HCT 46.6 03/20/2016   PLT 160.0 03/20/2016   GLUCOSE 229 (H) 01/12/2017   CHOL 118 01/12/2017   TRIG 188.0 (H) 01/12/2017   HDL 29.70 (L) 01/12/2017   LDLDIRECT 73.0 09/23/2016   LDLCALC 51 01/12/2017   ALT 28 01/12/2017   AST 19 01/12/2017   NA 139 01/12/2017   K 3.7 01/12/2017   CL 101  01/12/2017   CREATININE 1.14 01/12/2017   BUN 19 01/12/2017   CO2 34 (H) 01/12/2017   TSH 3.66 03/20/2016   INR 1.11 10/19/2011   HGBA1C 6.0 01/12/2017   MICROALBUR <0.7 01/12/2017       Assessment & Plan:   Problem List Items Addressed This Visit    Abnormal liver function    Low carb diet and exercise.  Follow liver panel.  Recent check wnl.        Anxiety    Followed by Dr Nicolasa Ducking.  Increased stress recently as outlined.  Overall he feels he is handling things relatively well.  Follow.        CAD (coronary artery disease)    Followed by Dr Rockey Situ.  Stable.  Continue risk factor modification.       Diabetes mellitus with cardiac complication (HCC)    Low carb diet and exercise.  Discussed recent labs.  a1c 6.0.  Follow met b and a1c.  Is watching hid diet.        Relevant Orders   Hemoglobin M4W   Basic metabolic panel   GERD (gastroesophageal reflux disease)    Controlled on current regimen.        Relevant Medications   polyethylene glycol powder (GLYCOLAX/MIRALAX) powder   Hyperglycemia    Low carb diet and exercise.  Follow met b and a1c.        Hyperlipidemia    On lipitor.  Low cholesterol diet and exercise.  Follow lipid panel and liver function tests.        Relevant Orders   Lipid panel   Hepatic function panel   Hypertension    Blood pressure under good control.  Continue same medication regimen.  Follow pressures.  Follow metabolic panel.        Relevant Orders   TSH   CBC  with Differential/Platelet   Sleep apnea    CPAP.        Other Visit Diagnoses    Left shoulder pain, unspecified chronicity    -  Primary   Pain and exam as outlined.  concern regarding biceps tendon rupture.  refer back to ortho.     Relevant Orders   Ambulatory referral to Orthopedic Surgery   Need for immunization against influenza       Relevant Orders   Flu Vaccine QUAD 36+ mos IM (Completed)       Einar Pheasant, MD

## 2017-01-17 ENCOUNTER — Encounter: Payer: Self-pay | Admitting: Internal Medicine

## 2017-01-17 NOTE — Assessment & Plan Note (Signed)
Low carb diet and exercise.  Discussed recent labs.  a1c 6.0.  Follow met b and a1c.  Is watching hid diet.

## 2017-01-17 NOTE — Assessment & Plan Note (Signed)
Blood pressure under good control.  Continue same medication regimen.  Follow pressures.  Follow metabolic panel.   

## 2017-01-17 NOTE — Assessment & Plan Note (Signed)
Low carb diet and exercise.  Follow met b and a1c.   

## 2017-01-17 NOTE — Assessment & Plan Note (Signed)
Controlled on current regimen.   

## 2017-01-17 NOTE — Assessment & Plan Note (Signed)
Low carb diet and exercise.  Follow liver panel.  Recent check wnl.

## 2017-01-17 NOTE — Assessment & Plan Note (Signed)
On lipitor.  Low cholesterol diet and exercise.  Follow lipid panel and liver function tests.   

## 2017-01-17 NOTE — Assessment & Plan Note (Signed)
Followed by Dr Nicolasa Ducking.  Increased stress recently as outlined.  Overall he feels he is handling things relatively well.  Follow.

## 2017-01-17 NOTE — Assessment & Plan Note (Signed)
Followed by Dr Rockey Situ.  Stable.  Continue risk factor modification.

## 2017-01-17 NOTE — Assessment & Plan Note (Signed)
CPAP.  

## 2017-01-23 ENCOUNTER — Other Ambulatory Visit: Payer: Self-pay | Admitting: Cardiovascular Disease

## 2017-02-04 DIAGNOSIS — S46112A Strain of muscle, fascia and tendon of long head of biceps, left arm, initial encounter: Secondary | ICD-10-CM | POA: Diagnosis not present

## 2017-02-04 DIAGNOSIS — E1159 Type 2 diabetes mellitus with other circulatory complications: Secondary | ICD-10-CM | POA: Diagnosis not present

## 2017-02-04 DIAGNOSIS — M25512 Pain in left shoulder: Secondary | ICD-10-CM | POA: Diagnosis not present

## 2017-02-05 DIAGNOSIS — F3181 Bipolar II disorder: Secondary | ICD-10-CM | POA: Diagnosis not present

## 2017-02-05 DIAGNOSIS — F1220 Cannabis dependence, uncomplicated: Secondary | ICD-10-CM | POA: Diagnosis not present

## 2017-02-05 DIAGNOSIS — G4700 Insomnia, unspecified: Secondary | ICD-10-CM | POA: Diagnosis not present

## 2017-03-30 ENCOUNTER — Other Ambulatory Visit: Payer: Self-pay | Admitting: Internal Medicine

## 2017-05-06 DIAGNOSIS — G4733 Obstructive sleep apnea (adult) (pediatric): Secondary | ICD-10-CM | POA: Diagnosis not present

## 2017-05-13 ENCOUNTER — Other Ambulatory Visit: Payer: BLUE CROSS/BLUE SHIELD

## 2017-05-18 ENCOUNTER — Encounter: Payer: Self-pay | Admitting: Internal Medicine

## 2017-05-18 ENCOUNTER — Ambulatory Visit (INDEPENDENT_AMBULATORY_CARE_PROVIDER_SITE_OTHER): Payer: BLUE CROSS/BLUE SHIELD | Admitting: Internal Medicine

## 2017-05-18 VITALS — BP 128/78 | HR 76 | Temp 97.4°F | Resp 16 | Ht 72.0 in | Wt 229.4 lb

## 2017-05-18 DIAGNOSIS — E782 Mixed hyperlipidemia: Secondary | ICD-10-CM | POA: Diagnosis not present

## 2017-05-18 DIAGNOSIS — G4730 Sleep apnea, unspecified: Secondary | ICD-10-CM | POA: Diagnosis not present

## 2017-05-18 DIAGNOSIS — S99929A Unspecified injury of unspecified foot, initial encounter: Secondary | ICD-10-CM

## 2017-05-18 DIAGNOSIS — F419 Anxiety disorder, unspecified: Secondary | ICD-10-CM | POA: Diagnosis not present

## 2017-05-18 DIAGNOSIS — Z0001 Encounter for general adult medical examination with abnormal findings: Secondary | ICD-10-CM | POA: Diagnosis not present

## 2017-05-18 DIAGNOSIS — K2100 Gastro-esophageal reflux disease with esophagitis, without bleeding: Secondary | ICD-10-CM

## 2017-05-18 DIAGNOSIS — D696 Thrombocytopenia, unspecified: Secondary | ICD-10-CM

## 2017-05-18 DIAGNOSIS — M25512 Pain in left shoulder: Secondary | ICD-10-CM

## 2017-05-18 DIAGNOSIS — I25118 Atherosclerotic heart disease of native coronary artery with other forms of angina pectoris: Secondary | ICD-10-CM | POA: Diagnosis not present

## 2017-05-18 DIAGNOSIS — E1159 Type 2 diabetes mellitus with other circulatory complications: Secondary | ICD-10-CM

## 2017-05-18 DIAGNOSIS — I10 Essential (primary) hypertension: Secondary | ICD-10-CM | POA: Diagnosis not present

## 2017-05-18 DIAGNOSIS — R208 Other disturbances of skin sensation: Secondary | ICD-10-CM

## 2017-05-18 DIAGNOSIS — R945 Abnormal results of liver function studies: Secondary | ICD-10-CM

## 2017-05-18 DIAGNOSIS — R200 Anesthesia of skin: Secondary | ICD-10-CM

## 2017-05-18 DIAGNOSIS — Z0000 Encounter for general adult medical examination without abnormal findings: Secondary | ICD-10-CM

## 2017-05-18 LAB — BASIC METABOLIC PANEL
BUN: 24 mg/dL — ABNORMAL HIGH (ref 6–23)
CALCIUM: 9.2 mg/dL (ref 8.4–10.5)
CO2: 32 mEq/L (ref 19–32)
CREATININE: 0.95 mg/dL (ref 0.40–1.50)
Chloride: 103 mEq/L (ref 96–112)
GFR: 85.73 mL/min (ref >60.00)
Glucose, Bld: 159 mg/dL — ABNORMAL HIGH (ref 70–99)
Potassium: 3.9 mEq/L (ref 3.5–5.1)
Sodium: 141 mEq/L (ref 135–145)

## 2017-05-18 LAB — CBC WITH DIFFERENTIAL/PLATELET
BASOS ABS: 0 10*3/uL (ref 0.0–0.1)
Basophils Relative: 0.7 % (ref 0.0–3.0)
EOS ABS: 0.1 10*3/uL (ref 0.0–0.7)
Eosinophils Relative: 2.2 % (ref 0.0–5.0)
HEMATOCRIT: 43.8 % (ref 39.0–52.0)
HEMOGLOBIN: 15.2 g/dL (ref 13.0–17.0)
Lymphocytes Relative: 15.4 % (ref 12.0–46.0)
Lymphs Abs: 1 10*3/uL (ref 0.7–4.0)
MCHC: 34.6 g/dL (ref 30.0–36.0)
MCV: 91.2 fl (ref 78.0–100.0)
Monocytes Absolute: 0.5 10*3/uL (ref 0.1–1.0)
Monocytes Relative: 8.3 % (ref 3.0–12.0)
Neutro Abs: 4.7 10*3/uL (ref 1.4–7.7)
Neutrophils Relative %: 73.4 % (ref 43.0–77.0)
Platelets: 147 10*3/uL — ABNORMAL LOW (ref 150.0–400.0)
RBC: 4.8 Mil/uL (ref 4.22–5.81)
RDW: 12.9 % (ref 11.5–15.5)
WBC: 6.3 10*3/uL (ref 4.0–10.5)

## 2017-05-18 LAB — HEPATIC FUNCTION PANEL
ALBUMIN: 4.3 g/dL (ref 3.5–5.2)
ALT: 32 U/L (ref 0–53)
AST: 32 U/L (ref 0–37)
Alkaline Phosphatase: 43 U/L (ref 39–117)
BILIRUBIN TOTAL: 0.5 mg/dL (ref 0.2–1.2)
Bilirubin, Direct: 0.1 mg/dL (ref 0.0–0.3)
Total Protein: 6.6 g/dL (ref 6.0–8.3)

## 2017-05-18 LAB — VITAMIN B12: Vitamin B-12: 558 pg/mL (ref 211–911)

## 2017-05-18 LAB — LIPID PANEL
CHOLESTEROL: 113 mg/dL (ref 0–200)
HDL: 25.2 mg/dL — ABNORMAL LOW (ref >39.00)
LDL CALC: 60 mg/dL (ref 0–99)
NonHDL: 87.36
Total CHOL/HDL Ratio: 4
Triglycerides: 136 mg/dL (ref 0.0–149.0)
VLDL: 27.2 mg/dL (ref 0.0–40.0)

## 2017-05-18 LAB — TSH: TSH: 1.69 u[IU]/mL (ref 0.35–4.50)

## 2017-05-18 LAB — HEMOGLOBIN A1C: Hgb A1c MFr Bld: 6.5 % (ref 4.6–6.5)

## 2017-05-18 NOTE — Progress Notes (Signed)
Patient ID: Keith Gross., male   DOB: 10-13-1955, 62 y.o.   MRN: [id number]   Subjective:    Patient ID: Keith Gross., male    DOB: 08-Jan-1956, 62 y.o.   MRN: [id number]  HPI  Patient here for his physical exam.  He has been having persistent pain - left shoulder.  Saw Dr Leanor Kail.  Diagnosed with impingement symptomatology with partial rupture of the left distal biceps tendon.  S/p injection.  Still with pain.  Request referral to ortho for another opinion.  Taking increased ibuprofen.  Discussed the need to decrease amount of antiinflammatory medication he is taking.  No chest pain.  No sob.  No acid reflux.  No abdominal pain.  Bowels moving.  Handling stress.  Seeing Dr Nicolasa Ducking.  Does request referral to podiatry for issues with his right great toe nail.  Also reports some numbness in one area of his left foot.  Does not involve the whole foot.  No numbness in his ankle or extending up his leg.  Exercising.     Past Medical History:  Diagnosis Date  . Angina   . Anxiety   . Diastasis of muscle 2011   "abdominal"  . Dysplastic polyp of colon 2008   [facility]  . GERD (gastroesophageal reflux disease)   . Gout    Multiple episodes  . Hyperlipidemia   . Hypertension   . Kidney stones   . Myocardial infarction [facility]) 05/2011   "we think";  06/2011 abnl mv;  06/2011 Cath - subtotal occlusion of Ramus, otw nonobs dzs;  06/2011 PCI/DES of Ramus w/ 2.25x58m Resolute DES   . Sleep apnea    on CPAP  . Syncopal episodes   . Treadmill stress test negative for angina pectoris 2011   Past Surgical History:  Procedure Laterality Date  . CARDIAC CATHETERIZATION  06/2011   "diagnostic"  . COLONOSCOPY    . CORONARY ANGIOPLASTY WITH STENT PLACEMENT  07/09/11   "1"  . HEMORRHOID SURGERY  2000  [place]  . LEFT HEART CATHETERIZATION WITH CORONARY ANGIOGRAM N/A 10/21/2011   Procedure: LEFT HEART CATHETERIZATION WITH CORONARY ANGIOGRAM;  Surgeon: THillary Bow  MD;  [facility];  Service: Cardiovascular;  Laterality: N/A;  . LITHOTRIPSY  10/2008  . PERCUTANEOUS CORONARY STENT INTERVENTION (PCI-S) N/A 07/09/2011   Procedure: PERCUTANEOUS CORONARY STENT INTERVENTION (PCI-S);  Surgeon: MSherren Mocha MD;  [facility];  Service: Cardiovascular;  Laterality: N/A;  . UPPER GI ENDOSCOPY    . URETEROSCOPY  10/2009   Family History  Problem Relation Gross of Onset  . Heart attack Father 460      LVAD  . Hypertension Other    Social History   Socioeconomic History  . Marital status: Single    Spouse name: Not on file  . Number of children: 4  . Years of education: Not on file  . Highest education level: Not on file  Occupational History  . Occupation: HConservation officer, [employer] Social Needs  . Financial resource strain: Not on file  . Food insecurity:    Worry: Not on file    Inability: Not on file  . Transportation needs:    Medical: Not on file    Non-medical: Not on file  Tobacco Use  . Smoking status: Former Smoker    Packs/day: 0.50    Years: 20.00    Pack years: 10.00    Types:  Cigarettes    Last attempt to quit: 02/17/1990    Years since quitting: 27.2  . Smokeless tobacco: Never Used  Substance and Sexual Activity  . Alcohol use: Yes    Alcohol/week: 2.4 oz    Types: 2 Glasses of wine, 2 Cans of beer per week  . Drug use: Yes    Frequency: 7.0 times per week    Types: Marijuana    Comment: 07/09/11 "lets say a cigarette a day"  . Sexual activity: Yes  Lifestyle  . Physical activity:    Days per week: Not on file    Minutes per session: Not on file  . Stress: Not on file  Relationships  . Social connections:    Talks on phone: Not on file    Gets together: Not on file    Attends religious service: Not on file    Active member of club or organization: Not on file    Attends meetings of clubs or organizations: Not on file    Relationship status: Not on file  Other Topics Concern  . Not on file  Social  History Narrative   Married and has 4 children.      Outpatient Encounter Medications as of 05/18/2017  Medication Sig  . allopurinol (ZYLOPRIM) 100 MG tablet TAKE 1 TABLET BY MOUTH TWICE DAILY  . amLODipine (NORVASC) 10 MG tablet TAKE ONE TABLET BY MOUTH DAILY  . atorvastatin (LIPITOR) 20 MG tablet TAKE ONE TABLET BY MOUTH EVERY DAY  . cetirizine (ZYRTEC) 10 MG tablet Take 10 mg by mouth daily.  Marland Kitchen CINNAMON PO Take 1,000 mg by mouth 2 (two) times daily.   . clopidogrel (PLAVIX) 75 MG tablet TAKE ONE TABLET BY MOUTH EVERY DAY  . fluticasone (FLONASE) 50 MCG/ACT nasal spray USE 2 SPRAYS IN EACH NOSTRIL DAILY  . hydrochlorothiazide (HYDRODIURIL) 25 MG tablet TAKE ONE TABLET BY MOUTH AT BEDTIME  . hydrOXYzine (ATARAX/VISTARIL) 50 MG tablet Take 50 mg by mouth 2 (two) times daily.  Marland Kitchen lamoTRIgine (LAMICTAL) 200 MG tablet Take 200 mg by mouth daily.  . magnesium oxide (MAG-OX) 400 MG tablet Take 400 mg by mouth daily.  . nitroGLYCERIN (NITROSTAT) 0.4 MG SL tablet Place 1 tablet (0.4 mg total) under the tongue every 5 (five) minutes as needed for chest pain.  Marland Kitchen olmesartan-hydrochlorothiazide (BENICAR HCT) 40-25 MG tablet Take 1 tablet by mouth daily.  . pantoprazole (PROTONIX) 40 MG tablet TAKE ONE TABLET BY MOUTH EVERY DAY  . polyethylene glycol powder (GLYCOLAX/MIRALAX) powder TAKE 17 GRAMS BY MOUTH EVERY DAY as needed (as directed)  . potassium citrate (UROCIT-K) 10 MEQ (1080 MG) SR tablet TAKE ONE TABLET BY MOUTH TWICE DAILY  . Psyllium (METAMUCIL PO) Take by mouth.  . tamsulosin (FLOMAX) 0.4 MG CAPS Take 0.4 mg by mouth daily.  . TRAZODONE HCL PO Take 200 mg by mouth at bedtime.  Marland Kitchen venlafaxine XR (EFFEXOR-XR) 150 MG 24 hr capsule Take 150 mg by mouth daily with breakfast.  . VITAMIN D, CHOLECALCIFEROL, PO Take 2,000 mg by mouth daily.  Marland Kitchen VITAMIN E PO Take 2,000 Units by mouth daily.  . [DISCONTINUED] allopurinol (ZYLOPRIM) 100 MG tablet TAKE ONE TABLET BY MOUTH TWICE DAILY  . [DISCONTINUED]  fluticasone (FLONASE) 50 MCG/ACT nasal spray Place 2 sprays into both nostrils daily.   No facility-administered encounter medications on file as of 05/18/2017.     Review of Systems  Constitutional: Negative for appetite change and unexpected weight change.  HENT: Negative for congestion and sinus  pressure.   Eyes: Negative for pain and visual disturbance.  Respiratory: Negative for cough, chest tightness and shortness of breath.   Cardiovascular: Negative for chest pain, palpitations and leg swelling.  Gastrointestinal: Negative for abdominal pain, diarrhea, nausea and vomiting.  Genitourinary: Negative for difficulty urinating and dysuria.  Musculoskeletal: Negative for back pain and joint swelling.       Persistent left shoulder pain.    Skin: Negative for color change and rash.  Neurological: Negative for dizziness, light-headedness and headaches.  Hematological: Negative for adenopathy. Does not bruise/bleed easily.  Psychiatric/Behavioral: Negative for agitation and dysphoric mood.       Objective:    Physical Exam  Constitutional: He is oriented to person, place, and time. He appears well-developed and well-nourished. No distress.  HENT:  Head: Normocephalic and atraumatic.  Nose: Nose normal.  Mouth/Throat: Oropharynx is clear and moist. No oropharyngeal exudate.  Eyes: Conjunctivae are normal. Right eye exhibits no discharge. Left eye exhibits no discharge.  Neck: Neck supple. No thyromegaly present.  Cardiovascular: Normal rate and regular rhythm.  Pulmonary/Chest: Breath sounds normal. No respiratory distress. He has no wheezes.  Abdominal: Soft. Bowel sounds are normal. There is no tenderness.  Genitourinary:  Genitourinary Comments: Followed by urology.   Musculoskeletal: He exhibits no edema or tenderness.  Lymphadenopathy:    He has no cervical adenopathy.  Neurological: He is alert and oriented to person, place, and time.  Skin: Skin is warm and dry. No rash  noted. No erythema.  Psychiatric: He has a normal mood and affect. His behavior is normal.    BP 128/78 (BP Location: Left Arm, Patient Position: Sitting, Cuff Size: Normal)   Pulse 76   Temp (!) 97.4 F (36.3 C) (Oral)   Resp 16   Ht 6' (1.829 m)   Wt 229 lb 6.4 oz (104.1 kg)   SpO2 97%   BMI 31.11 kg/m  Wt Readings from Last 3 Encounters:  05/18/17 229 lb 6.4 oz (104.1 kg)  01/14/17 229 lb 12.8 oz (104.2 kg)  09/25/16 233 lb 3.2 oz (105.8 kg)     Lab Results  Component Value Date   WBC 6.3 05/18/2017   HGB 15.2 05/18/2017   HCT 43.8 05/18/2017   PLT 147.0 (L) 05/18/2017   GLUCOSE 159 (H) 05/18/2017   CHOL 113 05/18/2017   TRIG 136.0 05/18/2017   HDL 25.20 (L) 05/18/2017   LDLDIRECT 73.0 09/23/2016   LDLCALC 60 05/18/2017   ALT 32 05/18/2017   AST 32 05/18/2017   NA 141 05/18/2017   K 3.9 05/18/2017   CL 103 05/18/2017   CREATININE 0.95 05/18/2017   BUN 24 (H) 05/18/2017   CO2 32 05/18/2017   TSH 1.69 05/18/2017   INR 1.11 10/19/2011   HGBA1C 6.5 05/18/2017   MICROALBUR <0.7 01/12/2017      .        Assessment & Plan:   Problem List Items Addressed This Visit    Abnormal liver function    Low carb diet and exercise.  Follow liver panel.       Anxiety    Followed by Dr Nicolasa Ducking.  Overall feels he is doing relatively well.  Stable.  Follow.        CAD (coronary artery disease)    Followed by Dr Rockey Situ.  Stable.  Continue risk factor modification.        Diabetes mellitus with cardiac complication (HCC)    Low carb diet and exercise.  Follow met b  and a1c.        GERD (gastroesophageal reflux disease)    Controlled on current regimen.  Follow.       Health care maintenance    Physical today 05/18/17.  Prostate and psa followed by Dr Jacqlyn Larsen.  Colonoscopy 08/2013.        Hyperlipidemia    Low cholesterol diet and exercise.  Follow lipid panel and liver function tests.  On lipitor.       Hypertension    Blood pressure under good control.  Continue  same medication regimen.  Follow pressures.  Follow metabolic panel.        Left shoulder pain    Persistent pain.  S/p injection.  Request referral for another opinion.        Relevant Orders   Ambulatory referral to Orthopedic Surgery   Numbness of left foot    Localized to one area of the foot.  Does not extend up to the ankle or leg.  No pain.  No known injury.  Check B12.  Discussed further testing.  He declines.  Wants to monitor.        Relevant Orders   Vitamin B12 (Completed)   Sleep apnea    CPAP.       Thrombocytopenia ([place])    Recheck cbc to confirm stable.         Other Visit Diagnoses    Routine general medical examination at a health care facility    -  Primary   Injury of nail bed of toe       persistent issues with right great toe nail.  request referral to podiatry.     Relevant Orders   Ambulatory referral to Podiatry       Einar Pheasant, MD

## 2017-05-19 ENCOUNTER — Other Ambulatory Visit: Payer: Self-pay | Admitting: Internal Medicine

## 2017-05-19 DIAGNOSIS — D696 Thrombocytopenia, unspecified: Secondary | ICD-10-CM

## 2017-05-19 NOTE — Progress Notes (Signed)
Order placed for f/u platelet count.  

## 2017-05-20 ENCOUNTER — Encounter: Payer: Self-pay | Admitting: *Deleted

## 2017-05-25 ENCOUNTER — Encounter: Payer: Self-pay | Admitting: Internal Medicine

## 2017-05-25 NOTE — Assessment & Plan Note (Addendum)
Low cholesterol diet and exercise.  Follow lipid panel and liver function tests.  On lipitor.   

## 2017-05-25 NOTE — Assessment & Plan Note (Signed)
Followed by Dr Nicolasa Ducking.  Overall feels he is doing relatively well.  Stable.  Follow.

## 2017-05-25 NOTE — Assessment & Plan Note (Signed)
Persistent pain.  S/p injection.  Request referral for another opinion.

## 2017-05-25 NOTE — Assessment & Plan Note (Signed)
CPAP.  

## 2017-05-25 NOTE — Assessment & Plan Note (Signed)
Followed by Dr Rockey Situ.  Stable.  Continue risk factor modification.

## 2017-05-25 NOTE — Assessment & Plan Note (Signed)
Low carb diet and exercise.  Follow met b and a1c.   

## 2017-05-25 NOTE — Assessment & Plan Note (Signed)
Low carb diet and exercise.  Follow liver panel.   

## 2017-05-25 NOTE — Assessment & Plan Note (Signed)
Localized to one area of the foot.  Does not extend up to the ankle or leg.  No pain.  No known injury.  Check B12.  Discussed further testing.  He declines.  Wants to monitor.

## 2017-05-25 NOTE — Assessment & Plan Note (Signed)
Blood pressure under good control.  Continue same medication regimen.  Follow pressures.  Follow metabolic panel.   

## 2017-05-25 NOTE — Assessment & Plan Note (Signed)
Physical today 05/18/17.  Prostate and psa followed by Dr Jacqlyn Larsen.  Colonoscopy 08/2013.

## 2017-05-25 NOTE — Assessment & Plan Note (Signed)
Controlled on current regimen.  Follow.  

## 2017-05-25 NOTE — Assessment & Plan Note (Signed)
Recheck cbc to confirm stable.  

## 2017-05-28 DIAGNOSIS — G4700 Insomnia, unspecified: Secondary | ICD-10-CM | POA: Diagnosis not present

## 2017-05-28 DIAGNOSIS — F1220 Cannabis dependence, uncomplicated: Secondary | ICD-10-CM | POA: Diagnosis not present

## 2017-05-28 DIAGNOSIS — F3181 Bipolar II disorder: Secondary | ICD-10-CM | POA: Diagnosis not present

## 2017-06-11 ENCOUNTER — Telehealth: Payer: Self-pay | Admitting: *Deleted

## 2017-06-11 NOTE — Telephone Encounter (Addendum)
Pt's wife, Maudie Mercury states pt is to have minor toe procedure with Dr. Milinda Pointer next week, pt is on Plavix, does he need to stop.

## 2017-06-16 ENCOUNTER — Other Ambulatory Visit (INDEPENDENT_AMBULATORY_CARE_PROVIDER_SITE_OTHER): Payer: BLUE CROSS/BLUE SHIELD

## 2017-06-16 DIAGNOSIS — D696 Thrombocytopenia, unspecified: Secondary | ICD-10-CM

## 2017-06-17 ENCOUNTER — Ambulatory Visit: Payer: Self-pay | Admitting: Podiatry

## 2017-06-17 ENCOUNTER — Encounter: Payer: Self-pay | Admitting: Internal Medicine

## 2017-06-17 LAB — PLATELET COUNT: Platelets: 148 10*3/uL (ref 140–400)

## 2017-06-17 NOTE — Telephone Encounter (Signed)
Please call pt and find out if there is a specific monitor he needs (does insurance require a certain type).  If yes, then ok to give rx.  If no, he can have one here.

## 2017-06-18 ENCOUNTER — Other Ambulatory Visit: Payer: Self-pay | Admitting: Cardiovascular Disease

## 2017-06-18 ENCOUNTER — Other Ambulatory Visit: Payer: Self-pay | Admitting: Internal Medicine

## 2017-06-18 ENCOUNTER — Encounter: Payer: Self-pay | Admitting: Internal Medicine

## 2017-06-19 ENCOUNTER — Other Ambulatory Visit: Payer: Self-pay

## 2017-06-19 MED ORDER — ONETOUCH ULTRASOFT LANCETS MISC: 12 refills | Status: AC

## 2017-06-19 MED ORDER — GLUCOSE BLOOD VI STRP: ORAL_STRIP | 12 refills | Status: DC

## 2017-06-19 NOTE — Telephone Encounter (Signed)
Pt picked up machine. Rx sent for strips and lancets

## 2017-06-22 ENCOUNTER — Ambulatory Visit: Payer: BLUE CROSS/BLUE SHIELD | Admitting: Podiatry

## 2017-06-22 ENCOUNTER — Encounter: Payer: Self-pay | Admitting: Podiatry

## 2017-06-22 VITALS — BP 130/69 | HR 88 | Resp 16

## 2017-06-22 DIAGNOSIS — L600 Ingrowing nail: Secondary | ICD-10-CM | POA: Diagnosis not present

## 2017-06-22 MED ORDER — NEOMYCIN-POLYMYXIN-HC 1 % OT SOLN: OTIC | 1 refills | Status: DC

## 2017-06-22 NOTE — Patient Instructions (Signed)

## 2017-06-22 NOTE — Progress Notes (Signed)
G

## 2017-06-23 NOTE — Progress Notes (Signed)
Subjective:  Patient ID: Keith Gross., male    DOB: 04/10/1955,  MRN: [id number] HPI Chief Complaint  Patient presents with  . Toe Pain    Hallux nails bilateral - RIGHT nail thick and discolored, tender occasionally, would like removed. LEFT nail was removed many years ago and the medial border of nail continues to grow, trims back every 3 months but would like to have that piece removed too  . Diabetes    Last a1c was 6.5  . New Patient (Initial Visit)    Est pt - 7    61 y.o. male presents with the above complaint.   Denies fever chills nausea vomiting muscle aches pains calf pain chest pain back pain shortness of breath or headache.  Past Medical History:  Diagnosis Date  . Angina   . Anxiety   . Diastasis of muscle 2011   "abdominal"  . Dysplastic polyp of colon 2008   [facility]  . GERD (gastroesophageal reflux disease)   . Gout    Multiple episodes  . Hyperlipidemia   . Hypertension   . Kidney stones   . Myocardial infarction [facility]) 05/2011   "we think";  06/2011 abnl mv;  06/2011 Cath - subtotal occlusion of Ramus, otw nonobs dzs;  06/2011 PCI/DES of Ramus w/ 2.25x76mm Resolute DES   . Sleep apnea    on CPAP  . Syncopal episodes   . Treadmill stress test negative for angina pectoris 2011   Past Surgical History:  Procedure Laterality Date  . CARDIAC CATHETERIZATION  06/2011   "diagnostic"  . COLONOSCOPY    . CORONARY ANGIOPLASTY WITH STENT PLACEMENT  07/09/11   "1"  . HEMORRHOID SURGERY  2000  . [place]   left  . LEFT HEART CATHETERIZATION WITH CORONARY ANGIOGRAM N/A 10/21/2011   Procedure: LEFT HEART CATHETERIZATION WITH CORONARY ANGIOGRAM;  Surgeon: Hillary Bow, MD;  Location[facility] C[facility] LAB;  Service: Cardiovascular;  Laterality: N/A;  . LITHOTRIPSY  10/2008  . PERCUTANEOUS CORONARY STENT INTERVENTION (PCI-S) N/A 07/09/2011   Procedure: PERCUTANEOUS CORONARY STENT INTERVENTION (PCI-S);  Surgeon: Sherren Mocha, MD;  Location: [facility] CATH LAB;   Service: Cardiovascular;  Laterality: N/A;  . UPPER GI ENDOSCOPY    . URETEROSCOPY  10/2009    Current Outpatient Medications:  .  allopurinol (ZYLOPRIM) 100 MG tablet, TAKE 1 TABLET BY MOUTH TWICE DAILY, Disp: 180 tablet, Rfl: 1 .  amLODipine (NORVASC) 10 MG tablet, TAKE ONE TABLET BY MOUTH DAILY, Disp: 90 tablet, Rfl: 3 .  atorvastatin (LIPITOR) 20 MG tablet, TAKE ONE TABLET BY MOUTH EVERY DAY, Disp: 90 tablet, Rfl: 3 .  cetirizine (ZYRTEC) 10 MG tablet, Take 10 mg by mouth daily., Disp: , Rfl:  .  CINNAMON PO, Take 1,000 mg by mouth 2 (two) times daily. , Disp: , Rfl:  .  clopidogrel (PLAVIX) 75 MG tablet, TAKE ONE TABLET EVERY DAY, Disp: 90 tablet, Rfl: 3 .  fluticasone (FLONASE) 50 MCG/ACT nasal spray, USE 2 SPRAYS IN EACH NOSTRIL DAILY, Disp: 48 g, Rfl: 1 .  glucose blood test strip, Use as instructed to check sugars once daily. Dx E11.9, Disp: 100 each, Rfl: 12 .  hydrochlorothiazide (HYDRODIURIL) 25 MG tablet, TAKE ONE TABLET AT BEDTIME, Disp: 90 tablet, Rfl: 0 .  hydrOXYzine (ATARAX/VISTARIL) 50 MG tablet, Take 50 mg by mouth 2 (two) times daily., Disp: , Rfl:  .  lamoTRIgine (LAMICTAL) 200 MG tablet, Take 200 mg by mouth daily., Disp: , Rfl: 0 .  Lancets (ONETOUCH ULTRASOFT) lancets, Use as instructed to check sugars once daily. Dx E 11.9, Disp: 100 each, Rfl: 12 .  magnesium oxide (MAG-OX) 400 MG tablet, Take 400 mg by mouth daily., Disp: , Rfl:  .  NEOMYCIN-POLYMYXIN-HYDROCORTISONE (CORTISPORIN) 1 % SOLN OTIC solution, Apply 1-2 drops to toe BID after soaking, Disp: 10 mL, Rfl: 1 .  nitroGLYCERIN (NITROSTAT) 0.4 MG SL tablet, Place 1 tablet (0.4 mg total) under the tongue every 5 (five) minutes as needed for chest pain., Disp: 25 tablet, Rfl: 6 .  olmesartan-hydrochlorothiazide (BENICAR HCT) 40-25 MG tablet, TAKE ONE TABLET EVERY DAY, Disp: 30 tablet, Rfl: 5 .  pantoprazole (PROTONIX) 40 MG tablet, TAKE ONE TABLET BY MOUTH EVERY DAY, Disp: 90 tablet, Rfl: 1 .  polyethylene glycol  powder (GLYCOLAX/MIRALAX) powder, TAKE 17 GRAMS BY MOUTH EVERY DAY as needed (as directed), Disp: 527 g, Rfl: 1 .  potassium citrate (UROCIT-K) 10 MEQ (1080 MG) SR tablet, TAKE ONE TABLET BY MOUTH TWICE DAILY, Disp: 180 each, Rfl: 0 .  Psyllium (METAMUCIL PO), Take by mouth., Disp: , Rfl:  .  tamsulosin (FLOMAX) 0.4 MG CAPS, Take 0.4 mg by mouth daily., Disp: , Rfl:  .  TRAZODONE HCL PO, Take 200 mg by mouth at bedtime., Disp: , Rfl:  .  venlafaxine XR (EFFEXOR-XR) 150 MG 24 hr capsule, Take 150 mg by mouth daily with breakfast., Disp: , Rfl:  .  VITAMIN D, CHOLECALCIFEROL, PO, Take 2,000 mg by mouth daily., Disp: , Rfl:  .  VITAMIN E PO, Take 2,000 Units by mouth daily., Disp: , Rfl:   No Known Allergies Review of Systems Objective:   Vitals:   06/22/17 0841  BP: 130/69  Pulse: 88  Resp: 16    General: Well developed, nourished, in no acute distress, alert and oriented x3   Dermatological: Skin is warm, dry and supple bilateral. Nails x 10 are well maintained; remaining integument appears unremarkable at this time. There are no open sores, no preulcerative lesions, no rash or signs of infection present.  Sharp incurvated nail margins hallux right.  With moderate erythema to the tibiofibular border.  There is no purulence no malodor.  Hallux left demonstrates a portion of an ingrown nail remaining after what appears to be a total nail avulsion this nail was painful there is no erythema cellulitis drainage or odor.  This is just a small spicule to the tibial border.  Vascular: Dorsalis Pedis artery and Posterior Tibial artery pedal pulses are 2/4 bilateral with immedate capillary fill time. Pedal hair growth present. No varicosities and no lower extremity edema present bilateral.   Neruologic: Grossly intact via light touch bilateral. Vibratory intact via tuning fork bilateral. Protective threshold with Semmes Wienstein monofilament intact to all pedal sites bilateral. Patellar and Achilles  deep tendon reflexes 2+ bilateral. No Babinski or clonus noted bilateral.   Musculoskeletal: No gross boney pedal deformities bilateral. No pain, crepitus, or limitation noted with foot and ankle range of motion bilateral. Muscular strength 5/5 in all groups tested bilateral.  Gait: Unassisted, Nonantalgic.    Radiographs:  None taken  Assessment & Plan:   Assessment: Ingrown toenails hallux bilateral.  Plan: Chemical matrixectomy was performed performed today after a sterile block was performed.  3 cc of a 50-50 mixture of Marcaine plain and lidocaine plain was injected in a hallux block bilaterally.  Tolerated procedure well.  Chemical matrixectomy's were performed to the total nail after the total nail plate was avulsed.  He tolerated this  procedure well.  He was provided with both oral and written home-going instructions for care and soaking of his toe as well as a prescription for Cortisporin Otic to be applied twice daily after soaking.  On follow-up with him in 1 to 2 weeks to make sure that is healing well.  He will call with questions or concerns.  He will watch for signs and symptoms of infection.     Max T. Lester, [place]

## 2017-07-07 ENCOUNTER — Other Ambulatory Visit: Payer: Self-pay | Admitting: Family Medicine

## 2017-07-07 DIAGNOSIS — N400 Enlarged prostate without lower urinary tract symptoms: Secondary | ICD-10-CM

## 2017-07-07 DIAGNOSIS — E291 Testicular hypofunction: Secondary | ICD-10-CM

## 2017-07-08 ENCOUNTER — Ambulatory Visit: Payer: BLUE CROSS/BLUE SHIELD | Admitting: Podiatry

## 2017-07-09 ENCOUNTER — Other Ambulatory Visit: Payer: BLUE CROSS/BLUE SHIELD

## 2017-07-09 DIAGNOSIS — E291 Testicular hypofunction: Secondary | ICD-10-CM | POA: Diagnosis not present

## 2017-07-09 DIAGNOSIS — N400 Enlarged prostate without lower urinary tract symptoms: Secondary | ICD-10-CM

## 2017-07-10 LAB — TESTOSTERONE: TESTOSTERONE: 619 ng/dL (ref 264–916)

## 2017-07-10 LAB — PSA: PROSTATE SPECIFIC AG, SERUM: 0.4 ng/mL (ref 0.0–4.0)

## 2017-07-14 ENCOUNTER — Encounter: Payer: Self-pay | Admitting: Urology

## 2017-07-14 ENCOUNTER — Other Ambulatory Visit: Payer: Self-pay | Admitting: Internal Medicine

## 2017-07-14 ENCOUNTER — Ambulatory Visit: Payer: BLUE CROSS/BLUE SHIELD | Admitting: Urology

## 2017-07-14 VITALS — BP 123/72 | HR 79 | Ht 72.0 in | Wt 224.1 lb

## 2017-07-14 DIAGNOSIS — E291 Testicular hypofunction: Secondary | ICD-10-CM

## 2017-07-14 LAB — URINALYSIS, COMPLETE
Bilirubin, UA: NEGATIVE
GLUCOSE, UA: NEGATIVE
KETONES UA: NEGATIVE
Leukocytes, UA: NEGATIVE
NITRITE UA: NEGATIVE
PROTEIN UA: NEGATIVE
RBC, UA: NEGATIVE
SPEC GRAV UA: 1.015 (ref 1.005–1.030)
UUROB: 0.2 mg/dL (ref 0.2–1.0)
pH, UA: 7.5 (ref 5.0–7.5)

## 2017-07-14 LAB — MICROSCOPIC EXAMINATION
Bacteria, UA: NONE SEEN
Epithelial Cells (non renal): NONE SEEN /hpf (ref 0–10)
RBC, UA: NONE SEEN /hpf (ref 0–2)
WBC UA: NONE SEEN /HPF (ref 0–5)

## 2017-07-14 NOTE — Progress Notes (Signed)
07/14/2017 1:32 PM   Keith Gross. Sep 28, 1955 [id number]  Referring provider: Einar Gross, [address] [place], [place] [address]  Chief Complaint  Patient presents with  . Other    HPI: Keith Gross is a 62 year old male who presents for transfer of care from [facility].  He has been a long-standing patient of Keith Gross and has been followed for history of stone disease, ED and hypogonadism.  He had been on AndroGel for several years and switched to testosterone injections 6-8 months ago.  He is currently injecting 100 mg every Saturday.  He has good energy level and libido.  He has had no recent episodes of renal colic or passed calculi.  He has had a previous penile implant by Keith Gross.  He has no specific complaints today.  Blood work drawn last week remarkable for a testosterone level of 619 and PSA of 0.4.  A hematocrit in April 2019 was normal.   PMH: Past Medical History:  Diagnosis Date  . Acid reflux   . Angina   . Anxiety   . Arthritis   . Diastasis of muscle 2011   "abdominal"  . Dysplastic polyp of colon 2008   [facility]  . GERD (gastroesophageal reflux disease)   . Gout    Multiple episodes  . Heart disease   . Hyperlipidemia   . Hypertension   . Kidney stones   . Myocardial infarction [facility]) 05/2011   "we think";  06/2011 abnl mv;  06/2011 Cath - subtotal occlusion of Ramus, otw nonobs dzs;  06/2011 PCI/DES of Ramus w/ 2.25x52mm Resolute DES   . Sleep apnea    on CPAP  . Syncopal episodes   . Treadmill stress test negative for angina pectoris 2011    Surgical History: Past Surgical History:  Procedure Laterality Date  . CARDIAC CATHETERIZATION  06/2011   "diagnostic"  . COLONOSCOPY    . CORONARY ANGIOPLASTY WITH STENT PLACEMENT  07/09/11   "1"  . HEMORRHOID SURGERY  2000  . [place]   left  . LEFT HEART CATHETERIZATION WITH CORONARY ANGIOGRAM N/A 10/21/2011   Procedure: LEFT HEART CATHETERIZATION WITH CORONARY  ANGIOGRAM;  Surgeon: Hillary Bow, MD;  Location: [facility] CATH LAB;  Service: Cardiovascular;  Laterality: N/A;  . LITHOTRIPSY  10/2008  . PERCUTANEOUS CORONARY STENT INTERVENTION (PCI-S) N/A 07/09/2011   Procedure: PERCUTANEOUS CORONARY STENT INTERVENTION (PCI-S);  Surgeon: Keith Mocha, MD;  Location: [facility] CATH LAB;  Service: Cardiovascular;  Laterality: N/A;  . UPPER GI ENDOSCOPY    . URETEROSCOPY  10/2009    Home Medications:  Allergies as of 07/14/2017   No Known Allergies     Medication List        Accurate as of 07/14/17  1:32 PM. Always use your most recent med list.          allopurinol 100 MG tablet Commonly known as:  ZYLOPRIM TAKE 1 TABLET BY MOUTH TWICE DAILY   amLODipine 10 MG tablet Commonly known as:  NORVASC TAKE ONE TABLET BY MOUTH DAILY   atorvastatin 20 MG tablet Commonly known as:  LIPITOR TAKE ONE TABLET BY MOUTH EVERY DAY   cetirizine 10 MG tablet Commonly known as:  ZYRTEC Take 10 mg by mouth daily.   CINNAMON PO Take 1,000 mg by mouth 2 (two) times daily.   clopidogrel 75 MG tablet Commonly known as:  PLAVIX TAKE ONE TABLET EVERY DAY   FLOMAX 0.4 MG Caps capsule Generic  drug:  tamsulosin Take 0.4 mg by mouth daily.   fluticasone 50 MCG/ACT nasal spray Commonly known as:  FLONASE USE 2 SPRAYS IN EACH NOSTRIL DAILY AS DIRECTED   glucose blood test strip Use as instructed to check sugars once daily. Dx E11.9   hydrochlorothiazide 25 MG tablet Commonly known as:  HYDRODIURIL TAKE ONE TABLET AT BEDTIME   hydrOXYzine 50 MG tablet Commonly known as:  ATARAX/VISTARIL Take 50 mg by mouth 2 (two) times daily.   lamoTRIgine 200 MG tablet Commonly known as:  LAMICTAL Take 200 mg by mouth daily.   magnesium oxide 400 MG tablet Commonly known as:  MAG-OX Take 400 mg by mouth daily.   METAMUCIL PO Take by mouth.   NEOMYCIN-POLYMYXIN-HYDROCORTISONE 1 % Soln OTIC solution Commonly known as:  CORTISPORIN Apply 1-2 drops to toe BID after  soaking   nitroGLYCERIN 0.4 MG SL tablet Commonly known as:  NITROSTAT Place 1 tablet (0.4 mg total) under the tongue every 5 (five) minutes as needed for chest pain.   olmesartan-hydrochlorothiazide 40-25 MG tablet Commonly known as:  BENICAR HCT TAKE ONE TABLET EVERY DAY   onetouch ultrasoft lancets Use as instructed to check sugars once daily. Dx E 11.9   pantoprazole 40 MG tablet Commonly known as:  PROTONIX TAKE ONE TABLET BY MOUTH EVERY DAY   polyethylene glycol powder powder Commonly known as:  GLYCOLAX/MIRALAX TAKE 17 GRAMS BY MOUTH EVERY DAY as needed (as directed)   potassium citrate 10 MEQ (1080 MG) SR tablet Commonly known as:  UROCIT-K TAKE ONE TABLET BY MOUTH TWICE DAILY   TRAZODONE HCL PO Take 200 mg by mouth at bedtime.   venlafaxine XR 150 MG 24 hr capsule Commonly known as:  EFFEXOR-XR Take 150 mg by mouth daily with breakfast.   VITAMIN D (CHOLECALCIFEROL) PO Take 2,000 mg by mouth daily.   VITAMIN E PO Take 2,000 Units by mouth daily.       Allergies: No Known Allergies  Family History: Family History  Problem Relation Age of Onset  . Heart attack Father 28       LVAD  . Hypertension Other     Social History:  reports that he quit smoking about 27 years ago. His smoking use included cigarettes. He has a 10.00 pack-year smoking history. He has never used smokeless tobacco. He reports that he drinks about 2.4 oz of alcohol per week. He reports that he has current or past drug history. Drug: Marijuana. Frequency: 7.00 times per week.  ROS: UROLOGY Frequent Urination?: No Hard to postpone urination?: No Burning/pain with urination?: No Get up at night to urinate?: Yes Leakage of urine?: No Urine stream starts and stops?: No Trouble starting stream?: No Do you have to strain to urinate?: No Blood in urine?: No Urinary tract infection?: No Sexually transmitted disease?: No Injury to kidneys or bladder?: No Painful intercourse?: No Weak  stream?: No Erection problems?: No Penile pain?: No  Gastrointestinal Nausea?: No Vomiting?: No Indigestion/heartburn?: No Diarrhea?: Yes Constipation?: No  Constitutional Fever: No Night sweats?: Yes Weight loss?: No Fatigue?: No  Skin Skin rash/lesions?: No Itching?: No  Eyes Blurred vision?: No Double vision?: No  Ears/Nose/Throat Sore throat?: No Sinus problems?: No  Hematologic/Lymphatic Swollen glands?: No Easy bruising?: Yes  Cardiovascular Leg swelling?: No Chest pain?: No  Respiratory Cough?: No Shortness of breath?: No  Endocrine Excessive thirst?: No  Musculoskeletal Back pain?: Yes Joint pain?: No  Neurological Headaches?: No Dizziness?: No  Psychologic Depression?: No Anxiety?: Yes  Physical Exam: BP 123/72   Pulse 79   Ht 6' (1.829 m)   Wt 224 lb 1.6 oz (101.7 kg)   BMI 30.39 kg/m   Constitutional:  Alert and oriented, No acute distress. HEENT: [place] AT, moist mucus membranes.  Trachea midline, no masses. Cardiovascular: No clubbing, cyanosis, or edema. Respiratory: Normal respiratory effort, no increased work of breathing. GI: Abdomen is soft, nontender, nondistended, no abdominal masses GU: No CVA tenderness.  Prostate 30 g, smooth without nodules Lymph: No cervical or inguinal lymphadenopathy. Skin: No rashes, bruises or suspicious lesions. Neurologic: Grossly intact, no focal deficits, moving all 4 extremities. Psychiatric: Normal mood and affect.  Laboratory Data: Lab Results  Component Value Date   WBC 6.3 05/18/2017   HGB 15.2 05/18/2017   HCT 43.8 05/18/2017   MCV 91.2 05/18/2017   PLT 148 06/16/2017    Lab Results  Component Value Date   CREATININE 0.95 05/18/2017    No results found for: PSA  Lab Results  Component Value Date   TESTOSTERONE 619 07/09/2017    Lab Results  Component Value Date   HGBA1C 6.5 05/18/2017     Assessment & Plan:   62 year old male doing well on TRT.  He has stable  lower urinary tract symptoms.  DRE is benign.  Continue testosterone injections.  Follow-up annually with interim 69-month testosterone/hematocrit.    Abbie Sons, [place] [address] [place], [place] [address] [phone]

## 2017-07-22 ENCOUNTER — Ambulatory Visit: Payer: BLUE CROSS/BLUE SHIELD | Admitting: Podiatry

## 2017-07-24 ENCOUNTER — Other Ambulatory Visit: Payer: Self-pay | Admitting: Internal Medicine

## 2017-09-03 DIAGNOSIS — F3181 Bipolar II disorder: Secondary | ICD-10-CM | POA: Diagnosis not present

## 2017-09-03 DIAGNOSIS — G4700 Insomnia, unspecified: Secondary | ICD-10-CM | POA: Diagnosis not present

## 2017-09-03 DIAGNOSIS — F1220 Cannabis dependence, uncomplicated: Secondary | ICD-10-CM | POA: Diagnosis not present

## 2017-09-23 ENCOUNTER — Other Ambulatory Visit: Payer: Self-pay | Admitting: Cardiovascular Disease

## 2017-09-23 ENCOUNTER — Other Ambulatory Visit: Payer: Self-pay | Admitting: Internal Medicine

## 2017-09-25 ENCOUNTER — Telehealth: Payer: Self-pay | Admitting: Cardiovascular Disease

## 2017-09-25 NOTE — Telephone Encounter (Signed)
lmov to schedule ov with [facility]

## 2017-09-25 NOTE — Telephone Encounter (Signed)
-----   Message from Janan Ridge, [place] sent at 09/23/2017 10:33 AM EDT ----- Regarding: Patient needs an appointment  Can you please contact patient for a 12 month follow up. I will send in refills for patient until the patient is made.   Thank!

## 2017-09-29 NOTE — Telephone Encounter (Signed)
Patient scheduled 9/10 with Dr. Rockey Situ

## 2017-10-05 DIAGNOSIS — F3181 Bipolar II disorder: Secondary | ICD-10-CM | POA: Diagnosis not present

## 2017-10-05 DIAGNOSIS — F1220 Cannabis dependence, uncomplicated: Secondary | ICD-10-CM | POA: Diagnosis not present

## 2017-10-05 DIAGNOSIS — G4700 Insomnia, unspecified: Secondary | ICD-10-CM | POA: Diagnosis not present

## 2017-10-26 NOTE — Progress Notes (Signed)
Patient ID: Keith Gross., male   DOB: 1955/12/27, 62 y.o.   MRN: [id number] Cardiology Office Note  Date:  10/27/2017   ID:  Keith Gross., DOB 01/21/1956, MRN [id number]  PCP:  Einar Pheasant, MD   Chief Complaint  Patient presents with  . other    OD 12 month f/u no complaints today. Meds reviewed verbally with pt.    HPI:  Keith Gross is a very pleasant 62 year old gentleman syncope in 2010 felt secondary to low blood pressure that improved with medication changes,  chest pain symptoms at the beginning of April 2013,  stress test showing inferior lateral hypoperfusion consistent with large region of scar which was new from 2010,  cardiac catheterization showing severe ramus disease, with DES stent placed in May 2013. catheterization September 2013 for chest pain showing patent stent, distal left circumflex disease noted, moderate in nature. He presents today for follow-up of his coronary artery disease  Denies any significant chest pain Continues to do regular exercise Does cardio and weightlifting Has periodic follow-up with psychiatry,  Previous problems with outbursts of anger Recent medication changes  Blood pressure has been stable Cholesterol at goal  EKG personally reviewed by myself on todays visit Shows normal sinus rhythm rate 78 bpm no significant ST or T wave changes  Other past medical history reviewed Previous surgery for ED with implant down place. Overall has healed well, happy with his decision. He is back at work, denies any significant chest pain concerning for angina. Has good energy Is wearing his CPAP for sleep apnea  Other past medical history He does have a previous history of smoking, quit in 1992. Father had his first heart attack at age 48.  PMH:   has a past medical history of Acid reflux, Angina, Anxiety, Arthritis, Diastasis of muscle (2011), Dysplastic polyp of colon (2008), GERD (gastroesophageal reflux disease), Gout, Heart  disease, Hyperlipidemia, Hypertension, Kidney stones, Myocardial infarction ([place]) (05/2011), Sleep apnea, Syncopal episodes, and Treadmill stress test negative for angina pectoris (2011).  PSH:    Past Surgical History:  Procedure Laterality Date  . CARDIAC CATHETERIZATION  06/2011   "diagnostic"  . COLONOSCOPY    . CORONARY ANGIOPLASTY WITH STENT PLACEMENT  07/09/11   "1"  . HEMORRHOID SURGERY  2000  . [place]   left  . LEFT HEART CATHETERIZATION WITH CORONARY ANGIOGRAM N/A 10/21/2011   Procedure: LEFT HEART CATHETERIZATION WITH CORONARY ANGIOGRAM;  Surgeon: Hillary Bow, MD;  Location: [facility] CATH LAB;  Service: Cardiovascular;  Laterality: N/A;  . LITHOTRIPSY  10/2008  . PERCUTANEOUS CORONARY STENT INTERVENTION (PCI-S) N/A 07/09/2011   Procedure: PERCUTANEOUS CORONARY STENT INTERVENTION (PCI-S);  Surgeon: Sherren Mocha, MD;  Location: [facility] CATH LAB;  Service: Cardiovascular;  Laterality: N/A;  . UPPER GI ENDOSCOPY    . URETEROSCOPY  10/2009    Current Outpatient Medications  Medication Sig Dispense Refill  . allopurinol (ZYLOPRIM) 100 MG tablet TAKE ONE TABLET BY MOUTH TWICE DAILY 180 tablet 1  . amLODipine (NORVASC) 10 MG tablet TAKE ONE TABLET BY MOUTH DAILY 90 tablet 3  . atorvastatin (LIPITOR) 20 MG tablet TAKE ONE TABLET BY MOUTH EVERY DAY 90 tablet 3  . cetirizine (ZYRTEC) 10 MG tablet Take 10 mg by mouth daily.    Marland Kitchen CINNAMON PO Take 1,000 mg by mouth 2 (two) times daily.     . clopidogrel (PLAVIX) 75 MG tablet TAKE ONE TABLET EVERY DAY 90 tablet 0  . fluticasone (FLONASE)  50 MCG/ACT nasal spray USE 2 SPRAYS IN EACH NOSTRIL DAILY AS DIRECTED 48 g 1  . glucose blood test strip Use as instructed to check sugars once daily. Dx E11.9 100 each 12  . hydrochlorothiazide (HYDRODIURIL) 25 MG tablet TAKE ONE TABLET AT BEDTIME 90 tablet 3  . hydrOXYzine (ATARAX/VISTARIL) 50 MG tablet Take 50 mg by mouth 2 (two) times daily.    Marland Kitchen lamoTRIgine (LAMICTAL) 100 MG tablet Takes  2 tablets am and 1 tablet pm daily.  0  . Lancets (ONETOUCH ULTRASOFT) lancets Use as instructed to check sugars once daily. Dx E 11.9 100 each 12  . magnesium oxide (MAG-OX) 400 MG tablet Take 400 mg by mouth daily.    . NEOMYCIN-POLYMYXIN-HYDROCORTISONE (CORTISPORIN) 1 % SOLN OTIC solution Apply 1-2 drops to toe BID after soaking 10 mL 1  . nitroGLYCERIN (NITROSTAT) 0.4 MG SL tablet Place 1 tablet (0.4 mg total) under the tongue every 5 (five) minutes as needed for chest pain. 25 tablet 6  . olmesartan-hydrochlorothiazide (BENICAR HCT) 40-25 MG tablet TAKE ONE TABLET EVERY DAY 30 tablet 5  . pantoprazole (PROTONIX) 40 MG tablet TAKE ONE TABLET BY MOUTH EVERY DAY 90 tablet 1  . polyethylene glycol powder (GLYCOLAX/MIRALAX) powder TAKE 17 GRAMS BY MOUTH EVERY DAY as needed (as directed) 527 g 1  . potassium citrate (UROCIT-K) 10 MEQ (1080 MG) SR tablet TAKE ONE TABLET BY MOUTH TWICE DAILY 180 each 0  . Psyllium (METAMUCIL PO) Take by mouth.    . tamsulosin (FLOMAX) 0.4 MG CAPS Take 0.4 mg by mouth daily.    . TRAZODONE HCL PO Take 200 mg by mouth at bedtime.    Marland Kitchen venlafaxine XR (EFFEXOR-XR) 150 MG 24 hr capsule Take 150 mg by mouth daily with breakfast.    . VITAMIN D, CHOLECALCIFEROL, PO Take 2,000 mg by mouth daily.    Marland Kitchen VITAMIN E PO Take 2,000 Units by mouth daily.     No current facility-administered medications for this visit.      Allergies:   Patient has no known allergies.   Social History:  The patient  reports that he quit smoking about 27 years ago. His smoking use included cigarettes. He has a 10.00 pack-year smoking history. He has never used smokeless tobacco. He reports that he drinks about 4.0 standard drinks of alcohol per week. He reports that he has current or past drug history. Drug: Marijuana. Frequency: 7.00 times per week.   Family History:   family history includes Heart attack (age of onset: 38) in his father; Hypertension in his other.    Review of  Systems: Review of Systems  Constitutional: Negative.   Respiratory: Negative.   Cardiovascular: Negative.   Gastrointestinal: Negative.   Musculoskeletal: Negative.   Neurological: Negative.   Psychiatric/Behavioral: Negative.   All other systems reviewed and are negative.    PHYSICAL EXAM: VS:  BP 124/78 (BP Location: Left Arm, Patient Position: Sitting, Cuff Size: Normal)   Pulse 78   Ht 6' (1.829 m)   Wt 227 lb 8 oz (103.2 kg)   BMI 30.85 kg/m  , BMI Body mass index is 30.85 kg/m. Constitutional:  oriented to person, place, and time. No distress.  HENT:  Head: Normocephalic and atraumatic.  Eyes:  no discharge. No scleral icterus.  Neck: Normal range of motion. Neck supple. No JVD present.  Cardiovascular: Normal rate, regular rhythm, normal heart sounds and intact distal pulses. Exam reveals no gallop and no friction rub. No edema No  murmur heard. Pulmonary/Chest: Effort normal and breath sounds normal. No stridor. No respiratory distress.  no wheezes.  no rales.  no tenderness.  Abdominal: Soft.  no distension.  no tenderness.  Musculoskeletal: Normal range of motion.  no  tenderness or deformity.  Neurological:  normal muscle tone. Coordination normal. No atrophy Skin: Skin is warm and dry. No rash noted. not diaphoretic.  Psychiatric:  normal mood and affect. behavior is normal. Thought content normal.    Recent Labs: 05/18/2017: ALT 32; BUN 24; Creatinine, Ser 0.95; Hemoglobin 15.2; Potassium 3.9; Sodium 141; TSH 1.69 06/16/2017: Platelets 148    Lipid Panel Lab Results  Component Value Date   CHOL 113 05/18/2017   HDL 25.20 (L) 05/18/2017   LDLCALC 60 05/18/2017   TRIG 136.0 05/18/2017      Wt Readings from Last 3 Encounters:  10/27/17 227 lb 8 oz (103.2 kg)  07/14/17 224 lb 1.6 oz (101.7 kg)  05/18/17 229 lb 6.4 oz (104.1 kg)       ASSESSMENT AND PLAN:  Coronary artery disease due to calcified coronary lesion - Plan: EKG 12-Lead Currently with no  symptoms of angina. No further workup at this time. Continue current medication regimen. Stable  Essential hypertension - Plan: EKG 12-Lead Blood pressure is well controlled on today's visit. No changes made to the medications. Stable numbers  stable angina (HCC) stable episodes relieved with nitroglycerin No further workup at this time  Hyperlipidemia Cholesterol is at goal on the current lipid regimen. No changes to the medications were made.  Borderline diabetes Long discussion, hemoglobin A1c 6 up to 6.5 Recommended strict low carbohydrate diet  Chest pressure Denies having any significant issues  Anxiety Stable, recommended regular exercise program Recent medication changes   Total encounter time more than 25 minutes  Greater than 50% was spent in counseling and coordination of care with the patient   Disposition:   F/U  12 months   Orders Placed This Encounter  Procedures  . EKG 12-Lead     Signed, Esmond Plants, M.D., Ph.D. 10/27/2017  [place], [place]

## 2017-10-27 ENCOUNTER — Encounter: Payer: Self-pay | Admitting: Cardiovascular Disease

## 2017-10-27 ENCOUNTER — Ambulatory Visit: Payer: BLUE CROSS/BLUE SHIELD | Admitting: Cardiovascular Disease

## 2017-10-27 VITALS — BP 124/78 | HR 78 | Ht 72.0 in | Wt 227.5 lb

## 2017-10-27 DIAGNOSIS — R55 Syncope and collapse: Secondary | ICD-10-CM | POA: Diagnosis not present

## 2017-10-27 DIAGNOSIS — I25118 Atherosclerotic heart disease of native coronary artery with other forms of angina pectoris: Secondary | ICD-10-CM

## 2017-10-27 DIAGNOSIS — E1159 Type 2 diabetes mellitus with other circulatory complications: Secondary | ICD-10-CM

## 2017-10-27 DIAGNOSIS — I10 Essential (primary) hypertension: Secondary | ICD-10-CM | POA: Diagnosis not present

## 2017-10-27 DIAGNOSIS — E782 Mixed hyperlipidemia: Secondary | ICD-10-CM

## 2017-10-27 MED ORDER — CLOPIDOGREL BISULFATE 75 MG PO TABS: 75.0000 mg | ORAL_TABLET | Freq: Every day | ORAL | 3 refills | Status: DC

## 2017-10-27 MED ORDER — ATORVASTATIN CALCIUM 20 MG PO TABS: 20.0000 mg | ORAL_TABLET | Freq: Every day | ORAL | 3 refills | Status: DC

## 2017-10-27 MED ORDER — NITROGLYCERIN 0.4 MG SL SUBL: 0.4000 mg | SUBLINGUAL_TABLET | SUBLINGUAL | 2 refills | Status: DC | PRN

## 2017-10-27 NOTE — Patient Instructions (Signed)

## 2017-10-29 ENCOUNTER — Other Ambulatory Visit: Payer: Self-pay | Admitting: Internal Medicine

## 2017-11-03 DIAGNOSIS — G4700 Insomnia, unspecified: Secondary | ICD-10-CM | POA: Diagnosis not present

## 2017-11-03 DIAGNOSIS — F1220 Cannabis dependence, uncomplicated: Secondary | ICD-10-CM | POA: Diagnosis not present

## 2017-11-03 DIAGNOSIS — F3181 Bipolar II disorder: Secondary | ICD-10-CM | POA: Diagnosis not present

## 2017-11-10 ENCOUNTER — Ambulatory Visit: Payer: BLUE CROSS/BLUE SHIELD | Admitting: Internal Medicine

## 2017-11-10 DIAGNOSIS — Z0289 Encounter for other administrative examinations: Secondary | ICD-10-CM

## 2017-11-13 ENCOUNTER — Other Ambulatory Visit: Payer: Self-pay | Admitting: Urology

## 2017-11-13 ENCOUNTER — Other Ambulatory Visit: Payer: Self-pay | Admitting: Family Medicine

## 2017-11-13 DIAGNOSIS — E291 Testicular hypofunction: Secondary | ICD-10-CM

## 2017-11-13 MED ORDER — SYRINGE (DISPOSABLE) 3 ML MISC: 1.0000 mL | 0 refills | Status: DC

## 2017-11-13 MED ORDER — "NEEDLE (DISP) 16G X 1"" MISC": 3 refills | Status: DC

## 2017-11-13 NOTE — Telephone Encounter (Signed)
Pt will be out of testosterone and needles this weekend.  [facility], Amy, called to state they sent request over 2 days ago.

## 2017-11-13 NOTE — Telephone Encounter (Signed)
Please e-scribe, needles already sent, thanks

## 2017-11-13 NOTE — Addendum Note (Signed)
Addended by: Donalee Citrin on: 11/13/2017 02:59 PM   Modules accepted: Orders

## 2017-11-16 ENCOUNTER — Encounter: Payer: Self-pay | Admitting: Internal Medicine

## 2017-11-16 ENCOUNTER — Telehealth: Payer: Self-pay | Admitting: Urology

## 2017-11-16 NOTE — Telephone Encounter (Signed)
Can you send the testosterone script to [facility] please? They are calling and asking for it and so is the patient.    Thanks,  Sharyn Lull

## 2017-11-17 MED ORDER — TESTOSTERONE CYPIONATE 200 MG/ML IM SOLN: 100.0000 mg | INTRAMUSCULAR | 0 refills | Status: DC

## 2017-11-17 NOTE — Telephone Encounter (Signed)
Rx was sent  

## 2017-11-17 NOTE — Addendum Note (Signed)
Addended by: Abbie Sons on: 11/17/2017 06:35 PM   Modules accepted: Orders

## 2017-11-19 NOTE — Telephone Encounter (Signed)
LMTCB so we can reschedule

## 2017-11-23 ENCOUNTER — Telehealth: Payer: Self-pay

## 2017-11-23 NOTE — Telephone Encounter (Signed)
Copied from [place] [id number]. Topic: Quick Communication - Office Called Patient >> Nov 23, 2017 12:15 PM Ivar Drape wrote: Reason for CRM:  Patient stated Larena Glassman gave him a call.

## 2017-11-24 NOTE — Telephone Encounter (Signed)
Patient missed appointment on 11/10/17 wants to be re-scheduled

## 2017-11-24 NOTE — Telephone Encounter (Signed)
Lm to call and schedule appt Also told pt to ask for [facility]

## 2017-12-14 ENCOUNTER — Other Ambulatory Visit: Payer: Self-pay | Admitting: Cardiovascular Disease

## 2017-12-14 ENCOUNTER — Other Ambulatory Visit: Payer: Self-pay | Admitting: Internal Medicine

## 2017-12-15 ENCOUNTER — Encounter: Payer: Self-pay | Admitting: Internal Medicine

## 2017-12-15 ENCOUNTER — Ambulatory Visit: Payer: BLUE CROSS/BLUE SHIELD | Admitting: Internal Medicine

## 2017-12-15 VITALS — BP 128/78 | HR 75 | Temp 98.1°F | Resp 18 | Wt 228.2 lb

## 2017-12-15 DIAGNOSIS — I10 Essential (primary) hypertension: Secondary | ICD-10-CM

## 2017-12-15 DIAGNOSIS — R739 Hyperglycemia, unspecified: Secondary | ICD-10-CM | POA: Diagnosis not present

## 2017-12-15 DIAGNOSIS — G4730 Sleep apnea, unspecified: Secondary | ICD-10-CM

## 2017-12-15 DIAGNOSIS — K2100 Gastro-esophageal reflux disease with esophagitis, without bleeding: Secondary | ICD-10-CM

## 2017-12-15 DIAGNOSIS — E1159 Type 2 diabetes mellitus with other circulatory complications: Secondary | ICD-10-CM

## 2017-12-15 DIAGNOSIS — Z23 Encounter for immunization: Secondary | ICD-10-CM

## 2017-12-15 DIAGNOSIS — F419 Anxiety disorder, unspecified: Secondary | ICD-10-CM

## 2017-12-15 DIAGNOSIS — R945 Abnormal results of liver function studies: Secondary | ICD-10-CM

## 2017-12-15 DIAGNOSIS — E782 Mixed hyperlipidemia: Secondary | ICD-10-CM

## 2017-12-15 DIAGNOSIS — R413 Other amnesia: Secondary | ICD-10-CM

## 2017-12-15 DIAGNOSIS — I25118 Atherosclerotic heart disease of native coronary artery with other forms of angina pectoris: Secondary | ICD-10-CM

## 2017-12-15 LAB — LIPID PANEL
Cholesterol: 133 mg/dL (ref 0–200)
HDL: 26.6 mg/dL — ABNORMAL LOW (ref >39.00)
Total CHOL/HDL Ratio: 5
Triglycerides: 488 mg/dL — ABNORMAL HIGH (ref 0.0–149.0)

## 2017-12-15 LAB — BASIC METABOLIC PANEL
BUN: 25 mg/dL — ABNORMAL HIGH (ref 6–23)
CHLORIDE: 100 meq/L (ref 96–112)
CO2: 29 meq/L (ref 19–32)
Calcium: 9.5 mg/dL (ref 8.4–10.5)
Creatinine, Ser: 1.08 mg/dL (ref 0.40–1.50)
GFR: 73.79 mL/min (ref >60.00)
Glucose, Bld: 182 mg/dL — ABNORMAL HIGH (ref 70–99)
POTASSIUM: 3.9 meq/L (ref 3.5–5.1)
SODIUM: 138 meq/L (ref 135–145)

## 2017-12-15 LAB — CBC WITH DIFFERENTIAL/PLATELET
BASOS ABS: 0 10*3/uL (ref 0.0–0.1)
Basophils Relative: 0.5 % (ref 0.0–3.0)
Eosinophils Absolute: 0.1 10*3/uL (ref 0.0–0.7)
Eosinophils Relative: 1.7 % (ref 0.0–5.0)
HCT: 46.2 % (ref 39.0–52.0)
Hemoglobin: 16.1 g/dL (ref 13.0–17.0)
LYMPHS ABS: 1.2 10*3/uL (ref 0.7–4.0)
LYMPHS PCT: 19.6 % (ref 12.0–46.0)
MCHC: 34.8 g/dL (ref 30.0–36.0)
MCV: 92 fl (ref 78.0–100.0)
MONOS PCT: 7.2 % (ref 3.0–12.0)
Monocytes Absolute: 0.4 10*3/uL (ref 0.1–1.0)
NEUTROS PCT: 71 % (ref 43.0–77.0)
Neutro Abs: 4.3 10*3/uL (ref 1.4–7.7)
Platelets: 149 10*3/uL — ABNORMAL LOW (ref 150.0–400.0)
RBC: 5.02 Mil/uL (ref 4.22–5.81)
RDW: 13 % (ref 11.5–15.5)
WBC: 6.1 10*3/uL (ref 4.0–10.5)

## 2017-12-15 LAB — TSH: TSH: 2.97 u[IU]/mL (ref 0.35–4.50)

## 2017-12-15 LAB — HEPATIC FUNCTION PANEL
ALBUMIN: 4.6 g/dL (ref 3.5–5.2)
ALK PHOS: 41 U/L (ref 39–117)
ALT: 31 U/L (ref 0–53)
AST: 25 U/L (ref 0–37)
Bilirubin, Direct: 0.1 mg/dL (ref 0.0–0.3)
Total Bilirubin: 0.4 mg/dL (ref 0.2–1.2)
Total Protein: 6.6 g/dL (ref 6.0–8.3)

## 2017-12-15 LAB — LDL CHOLESTEROL, DIRECT: LDL DIRECT: 73 mg/dL

## 2017-12-15 LAB — VITAMIN B12: VITAMIN B 12: 1418 pg/mL — AB (ref 211–911)

## 2017-12-15 LAB — HEMOGLOBIN A1C: Hgb A1c MFr Bld: 6.3 % (ref 4.6–6.5)

## 2017-12-15 MED ORDER — TAMSULOSIN HCL 0.4 MG PO CAPS: 0.4000 mg | ORAL_CAPSULE | Freq: Every day | ORAL | 1 refills | Status: DC

## 2017-12-15 NOTE — Progress Notes (Signed)
Patient ID: Keith Bellanca., male   DOB: Oct 06, 1955, 62 y.o.   MRN: [id number]   Subjective:    Patient ID: Keith Huy., male    DOB: 1955-09-15, 62 y.o.   MRN: [id number]  HPI  Patient here for a scheduled follow up.  Has known CAD.  Sees Dr Rockey Situ.  Last evaluated 10/27/17.  Stable.  No changes made.  Trying to stay active.  No chest pain.  No sob.  No acid reflux.  No abdominal pain.  Bowels moving.  No urine change.  Sees urology.  Has f/u scheduled with Dr Bernardo Heater.  On flomax.  Seeing Dr Nicolasa Ducking.  Overall he feels he is handling stress relatively well.  Does report problems with concentration and memory.  Discussed with him today. No problems driving.  States does not affect his work.  Discussed further w/up.     Past Medical History:  Diagnosis Date  . Acid reflux   . Angina   . Anxiety   . Arthritis   . Diastasis of muscle 2011   "abdominal"  . Dysplastic polyp of colon 2008   [facility]  . GERD (gastroesophageal reflux disease)   . Gout    Multiple episodes  . Heart disease   . Hyperlipidemia   . Hypertension   . Kidney stones   . Myocardial infarction [facility]) 05/2011   "we think";  06/2011 abnl mv;  06/2011 Cath - subtotal occlusion of Ramus, otw nonobs dzs;  06/2011 PCI/DES of Ramus w/ 2.25x28m Resolute DES   . Sleep apnea    on CPAP  . Syncopal episodes   . Treadmill stress test negative for angina pectoris 2011   Past Surgical History:  Procedure Laterality Date  . CARDIAC CATHETERIZATION  06/2011   "diagnostic"  . COLONOSCOPY    . CORONARY ANGIOPLASTY WITH STENT PLACEMENT  07/09/11   "1"  . HEMORRHOID SURGERY  2000  . [place]  left  . LEFT HEART CATHETERIZATION WITH CORONARY ANGIOGRAM N/A 10/21/2011   Procedure: LEFT HEART CATHETERIZATION WITH CORONARY ANGIOGRAM;  Surgeon: THillary Bow MD;  [facility];  Service: Cardiovascular;  Laterality: N/A;  . LITHOTRIPSY  10/2008  . PERCUTANEOUS CORONARY STENT INTERVENTION (PCI-S) N/A 07/09/2011    Procedure: PERCUTANEOUS CORONARY STENT INTERVENTION (PCI-S);  Surgeon: MSherren Mocha MD;  [facility];  Service: Cardiovascular;  Laterality: N/A;  . UPPER GI ENDOSCOPY    . URETEROSCOPY  10/2009   Family History  Problem Relation Age of Onset  . Heart attack Father 475      LVAD  . Hypertension Other    Social History   Socioeconomic History  . Marital status: Single    Spouse name: Not on file  . Number of children: 4  . Years of education: Not on file  . Highest education level: Not on file  Occupational History  . Occupation: HConservation officer[employer] Social Needs  . Financial resource strain: Not on file  . Food insecurity:    Worry: Not on file    Inability: Not on file  . Transportation needs:    Medical: Not on file    Non-medical: Not on file  Tobacco Use  . Smoking status: Former Smoker    Packs/day: 0.50    Years: 20.00    Pack years: 10.00    Types: Cigarettes    Last attempt to quit: 02/17/1990    Years since quitting: 27.8  .  Smokeless tobacco: Never Used  Substance and Sexual Activity  . Alcohol use: Yes    Alcohol/week: 4.0 standard drinks    Types: 2 Glasses of wine, 2 Cans of beer per week  . Drug use: Yes    Frequency: 7.0 times per week    Types: Marijuana    Comment: 07/09/11 "lets say a cigarette a day"  . Sexual activity: Yes  Lifestyle  . Physical activity:    Days per week: Not on file    Minutes per session: Not on file  . Stress: Not on file  Relationships  . Social connections:    Talks on phone: Not on file    Gets together: Not on file    Attends religious service: Not on file    Active member of club or organization: Not on file    Attends meetings of clubs or organizations: Not on file    Relationship status: Not on file  Other Topics Concern  . Not on file  Social History Narrative   Married and has 4 children.      Outpatient Encounter Medications as of 12/15/2017  Medication Sig  . allopurinol (ZYLOPRIM)  100 MG tablet TAKE ONE TABLET BY MOUTH TWICE DAILY  . amLODipine (NORVASC) 10 MG tablet TAKE ONE TABLET BY MOUTH DAILY  . atorvastatin (LIPITOR) 20 MG tablet Take 1 tablet (20 mg total) by mouth daily.  . cetirizine (ZYRTEC) 10 MG tablet Take 10 mg by mouth daily.  Marland Kitchen CINNAMON PO Take 1,000 mg by mouth 2 (two) times daily.   . clopidogrel (PLAVIX) 75 MG tablet Take 1 tablet (75 mg total) by mouth daily.  . fluticasone (FLONASE) 50 MCG/ACT nasal spray USE 2 SPRAYS IN EACH NOSTRIL DAILY AS DIRECTED  . glucose blood test strip Use as instructed to check sugars once daily. Dx E11.9  . hydrochlorothiazide (HYDRODIURIL) 25 MG tablet TAKE ONE TABLET AT BEDTIME  . hydrOXYzine (ATARAX/VISTARIL) 50 MG tablet Take 50 mg by mouth 2 (two) times daily.  Marland Kitchen lamoTRIgine (LAMICTAL) 100 MG tablet Takes 2 tablets am and 1 tablet pm daily.  . Lancets (ONETOUCH ULTRASOFT) lancets Use as instructed to check sugars once daily. Dx E 11.9  . magnesium oxide (MAG-OX) 400 MG tablet Take 400 mg by mouth daily.  Marland Kitchen NEEDLE, DISP, 16 G 16G X 1" MISC FOR TESTOSTERONE INJECTIONS  . NEOMYCIN-POLYMYXIN-HYDROCORTISONE (CORTISPORIN) 1 % SOLN OTIC solution Apply 1-2 drops to toe BID after soaking  . nitroGLYCERIN (NITROSTAT) 0.4 MG SL tablet Place 1 tablet (0.4 mg total) under the tongue every 5 (five) minutes as needed for chest pain.  Marland Kitchen olmesartan-hydrochlorothiazide (BENICAR HCT) 40-25 MG tablet TAKE ONE TABLET EVERY DAY  . polyethylene glycol powder (GLYCOLAX/MIRALAX) powder TAKE 17 GRAMS BY MOUTH EVERY DAY as needed (as directed)  . potassium citrate (UROCIT-K) 10 MEQ (1080 MG) SR tablet TAKE ONE TABLET BY MOUTH TWICE DAILY  . Psyllium (METAMUCIL PO) Take by mouth.  . Syringe, Disposable, (B-D SYRINGE LUER-LOK 3CC) 3 ML MISC 1 mL by Does not apply route every 14 (fourteen) days.  . tamsulosin (FLOMAX) 0.4 MG CAPS capsule Take 1 capsule (0.4 mg total) by mouth daily.  Marland Kitchen testosterone cypionate (DEPOTESTOSTERONE CYPIONATE) 200 MG/ML  injection Inject 0.5 mLs (100 mg total) into the muscle once a week.  . TRAZODONE HCL PO Take 200 mg by mouth at bedtime.  Marland Kitchen venlafaxine XR (EFFEXOR-XR) 150 MG 24 hr capsule Take 150 mg by mouth daily with breakfast.  . VITAMIN D,  CHOLECALCIFEROL, PO Take 2,000 mg by mouth daily.  Marland Kitchen VITAMIN E PO Take 2,000 Units by mouth daily.  . [DISCONTINUED] pantoprazole (PROTONIX) 40 MG tablet TAKE ONE TABLET BY MOUTH EVERY DAY  . [DISCONTINUED] tamsulosin (FLOMAX) 0.4 MG CAPS Take 0.4 mg by mouth daily.   No facility-administered encounter medications on file as of 12/15/2017.     Review of Systems  Constitutional: Negative for appetite change and unexpected weight change.  HENT: Negative for congestion and sinus pressure.   Respiratory: Negative for cough, chest tightness and shortness of breath.   Cardiovascular: Negative for chest pain, palpitations and leg swelling.  Gastrointestinal: Negative for abdominal pain, diarrhea, nausea and vomiting.  Genitourinary: Negative for difficulty urinating and dysuria.  Musculoskeletal: Negative for joint swelling and myalgias.  Skin: Negative for color change and rash.  Neurological: Negative for dizziness, light-headedness and headaches.       Trouble focusing and with memory.    Psychiatric/Behavioral: Negative for agitation and dysphoric mood.       Objective:    Physical Exam  Constitutional: He appears well-developed and well-nourished. No distress.  HENT:  Nose: Nose normal.  Mouth/Throat: Oropharynx is clear and moist.  Neck: Neck supple. No thyromegaly present.  Cardiovascular: Normal rate and regular rhythm.  Pulmonary/Chest: Effort normal and breath sounds normal. No respiratory distress.  Abdominal: Soft. Bowel sounds are normal. There is no tenderness.  Musculoskeletal: He exhibits no edema or tenderness.  Lymphadenopathy:    He has no cervical adenopathy.  Skin: No rash noted. No erythema.  Psychiatric: He has a normal mood and  affect. His behavior is normal.    BP 128/78   Pulse 75   Temp 98.1 F (36.7 C) (Oral)   Resp 18   Wt 228 lb 3.2 oz (103.5 kg)   SpO2 98%   BMI 30.95 kg/m  Wt Readings from Last 3 Encounters:  12/15/17 228 lb 3.2 oz (103.5 kg)  10/27/17 227 lb 8 oz (103.2 kg)  07/14/17 224 lb 1.6 oz (101.7 kg)     Lab Results  Component Value Date   WBC 6.1 12/15/2017   HGB 16.1 12/15/2017   HCT 46.2 12/15/2017   PLT 149.0 (L) 12/15/2017   GLUCOSE 182 (H) 12/15/2017   CHOL 133 12/15/2017   TRIG (H) 12/15/2017    488.0 Triglyceride is over 400; calculations on Lipids are invalid.   HDL 26.60 (L) 12/15/2017   LDLDIRECT 73.0 12/15/2017   LDLCALC 60 05/18/2017   ALT 31 12/15/2017   AST 25 12/15/2017   NA 138 12/15/2017   K 3.9 12/15/2017   CL 100 12/15/2017   CREATININE 1.08 12/15/2017   BUN 25 (H) 12/15/2017   CO2 29 12/15/2017   TSH 2.97 12/15/2017   INR 1.11 10/19/2011   HGBA1C 6.3 12/15/2017   MICROALBUR <0.7 01/12/2017       Assessment & Plan:   Problem List Items Addressed This Visit    Abnormal liver function    Low carb diet and exercise.  Discussed diet and weight loss.  Follow liver function tests.        Anxiety    Followed by Dr Nicolasa Ducking.  Feels he is doing well.  Follow.        CAD (coronary artery disease)    Followed by Dr Rockey Situ.  Currently doing well.  Continue risk factor modification.        Diabetes mellitus with cardiac complication (HCC)    Low carb diet and exercise.  Follow met b and a1c.        GERD (gastroesophageal reflux disease)    Controlled on protonix.  Follow.        Hyperglycemia - Primary    Low carb diet and exercise.  Follow met b and a1c.        Relevant Orders   Hemoglobin A1c (Completed)   Hyperlipidemia    Low cholesterol diet and exercise.  Follow lipid panel and liver function tests.  On lipitor.        Relevant Orders   Hepatic function panel (Completed)   Lipid panel (Completed)   Hypertension    Blood pressure  on recheck wnl.  Same medication regimen.  Follow.        Relevant Orders   CBC with Differential/Platelet (Completed)   Basic metabolic panel (Completed)   Memory change    Discussed at length with him today.  Appears to have issue with concentration which may be affecting his focus and memory.  Discussed further evaluation.  Check labs.  He plans to discuss with Dr Nicolasa Ducking.  Discussed ADHD.  Discussed memory issues.  Start with labs first and follow.        Relevant Orders   TSH (Completed)   Vitamin B12 (Completed)   Sleep apnea    CPAP.        Other Visit Diagnoses    Need for immunization against influenza       Relevant Orders   Flu Vaccine QUAD 36+ mos IM (Completed)       Einar Pheasant, MD

## 2017-12-20 ENCOUNTER — Encounter: Payer: Self-pay | Admitting: Internal Medicine

## 2017-12-20 NOTE — Assessment & Plan Note (Signed)
Controlled on protonix.  Follow.   

## 2017-12-20 NOTE — Assessment & Plan Note (Signed)
Followed by Dr Rockey Situ.  Currently doing well.  Continue risk factor modification.

## 2017-12-20 NOTE — Assessment & Plan Note (Signed)
Low carb diet and exercise.  Follow met b and a1c.   

## 2017-12-20 NOTE — Assessment & Plan Note (Signed)
Low carb diet and exercise.  Discussed diet and weight loss.  Follow liver function tests.

## 2017-12-20 NOTE — Assessment & Plan Note (Signed)
Discussed at length with him today.  Appears to have issue with concentration which may be affecting his focus and memory.  Discussed further evaluation.  Check labs.  He plans to discuss with Dr Nicolasa Ducking.  Discussed ADHD.  Discussed memory issues.  Start with labs first and follow.

## 2017-12-20 NOTE — Assessment & Plan Note (Signed)
CPAP.  

## 2017-12-20 NOTE — Assessment & Plan Note (Signed)
Low cholesterol diet and exercise.  Follow lipid panel and liver function tests.  On lipitor.   

## 2017-12-20 NOTE — Assessment & Plan Note (Signed)
Followed by Dr Nicolasa Ducking.  Feels he is doing well.  Follow.

## 2017-12-20 NOTE — Assessment & Plan Note (Signed)
Blood pressure on recheck wnl.  Same medication regimen.  Follow.

## 2017-12-21 ENCOUNTER — Other Ambulatory Visit: Payer: Self-pay | Admitting: Cardiovascular Disease

## 2017-12-25 ENCOUNTER — Other Ambulatory Visit: Payer: Self-pay | Admitting: Internal Medicine

## 2018-01-13 ENCOUNTER — Other Ambulatory Visit: Payer: Self-pay

## 2018-01-13 DIAGNOSIS — N400 Enlarged prostate without lower urinary tract symptoms: Secondary | ICD-10-CM

## 2018-01-13 DIAGNOSIS — E291 Testicular hypofunction: Secondary | ICD-10-CM

## 2018-01-18 ENCOUNTER — Other Ambulatory Visit: Payer: BLUE CROSS/BLUE SHIELD

## 2018-01-18 ENCOUNTER — Other Ambulatory Visit: Payer: Self-pay | Admitting: Internal Medicine

## 2018-01-18 DIAGNOSIS — E291 Testicular hypofunction: Secondary | ICD-10-CM

## 2018-01-18 DIAGNOSIS — N400 Enlarged prostate without lower urinary tract symptoms: Secondary | ICD-10-CM | POA: Diagnosis not present

## 2018-01-19 ENCOUNTER — Telehealth: Payer: Self-pay

## 2018-01-19 LAB — TESTOSTERONE: TESTOSTERONE: 609 ng/dL (ref 264–916)

## 2018-01-19 LAB — HEMATOCRIT: HEMATOCRIT: 46.5 % (ref 37.5–51.0)

## 2018-01-19 LAB — PSA: PROSTATE SPECIFIC AG, SERUM: 0.3 ng/mL (ref 0.0–4.0)

## 2018-01-19 NOTE — Telephone Encounter (Signed)
-----   Message from Abbie Sons, MD sent at 01/19/2018 10:23 AM EST ----- Testosterone level looks good at 609; PSA stable at 0.3; hematocrit was normal.  Follow-up as scheduled

## 2018-04-16 ENCOUNTER — Other Ambulatory Visit: Payer: Self-pay | Admitting: Internal Medicine

## 2018-04-19 ENCOUNTER — Other Ambulatory Visit: Payer: Self-pay | Admitting: Family Medicine

## 2018-04-20 MED ORDER — TESTOSTERONE CYPIONATE 200 MG/ML IM SOLN: 100.0000 mg | INTRAMUSCULAR | 0 refills | Status: DC

## 2018-06-17 ENCOUNTER — Other Ambulatory Visit: Payer: Self-pay | Admitting: Cardiovascular Disease

## 2018-06-17 ENCOUNTER — Other Ambulatory Visit: Payer: Self-pay | Admitting: Internal Medicine

## 2018-06-18 ENCOUNTER — Telehealth: Payer: Self-pay | Admitting: Internal Medicine

## 2018-06-18 DIAGNOSIS — D696 Thrombocytopenia, unspecified: Secondary | ICD-10-CM

## 2018-06-18 DIAGNOSIS — E1159 Type 2 diabetes mellitus with other circulatory complications: Secondary | ICD-10-CM

## 2018-06-18 DIAGNOSIS — R945 Abnormal results of liver function studies: Secondary | ICD-10-CM

## 2018-06-18 DIAGNOSIS — R739 Hyperglycemia, unspecified: Secondary | ICD-10-CM

## 2018-06-18 DIAGNOSIS — E782 Mixed hyperlipidemia: Secondary | ICD-10-CM

## 2018-06-18 DIAGNOSIS — I10 Essential (primary) hypertension: Secondary | ICD-10-CM

## 2018-06-18 NOTE — Telephone Encounter (Signed)
Orders placed for labs

## 2018-06-18 NOTE — Telephone Encounter (Signed)
I talked to Keith Gross. She is scheduling him for fasting labs 1-2 days prior to appt. He will need future orders

## 2018-06-18 NOTE — Telephone Encounter (Signed)
PT called and would like to have labs done before his follow up next week. Could Dr. Nicki Reaper put in labs and then let me know so I can schedule lab appt.

## 2018-06-20 ENCOUNTER — Other Ambulatory Visit: Payer: Self-pay | Admitting: Cardiovascular Disease

## 2018-06-20 ENCOUNTER — Other Ambulatory Visit: Payer: Self-pay | Admitting: Internal Medicine

## 2018-06-21 ENCOUNTER — Encounter: Payer: Self-pay | Admitting: Internal Medicine

## 2018-06-21 ENCOUNTER — Other Ambulatory Visit (INDEPENDENT_AMBULATORY_CARE_PROVIDER_SITE_OTHER): Payer: BLUE CROSS/BLUE SHIELD

## 2018-06-21 ENCOUNTER — Other Ambulatory Visit: Payer: Self-pay

## 2018-06-21 DIAGNOSIS — E782 Mixed hyperlipidemia: Secondary | ICD-10-CM

## 2018-06-21 DIAGNOSIS — R739 Hyperglycemia, unspecified: Secondary | ICD-10-CM | POA: Diagnosis not present

## 2018-06-21 DIAGNOSIS — E1159 Type 2 diabetes mellitus with other circulatory complications: Secondary | ICD-10-CM | POA: Diagnosis not present

## 2018-06-21 DIAGNOSIS — R945 Abnormal results of liver function studies: Secondary | ICD-10-CM | POA: Diagnosis not present

## 2018-06-21 DIAGNOSIS — D696 Thrombocytopenia, unspecified: Secondary | ICD-10-CM

## 2018-06-21 LAB — HEPATIC FUNCTION PANEL
ALT: 30 U/L (ref 0–53)
AST: 40 U/L — ABNORMAL HIGH (ref 0–37)
Albumin: 4.7 g/dL (ref 3.5–5.2)
Alkaline Phosphatase: 42 U/L (ref 39–117)
Bilirubin, Direct: 0.2 mg/dL (ref 0.0–0.3)
Total Bilirubin: 1 mg/dL (ref 0.2–1.2)
Total Protein: 6.8 g/dL (ref 6.0–8.3)

## 2018-06-21 LAB — MICROALBUMIN / CREATININE URINE RATIO
Creatinine,U: 133.5 mg/dL
Microalb Creat Ratio: 0.7 mg/g (ref 0.0–30.0)
Microalb, Ur: 0.9 mg/dL (ref 0.0–1.9)

## 2018-06-21 LAB — CBC WITH DIFFERENTIAL/PLATELET
Basophils Absolute: 0.1 10*3/uL (ref 0.0–0.1)
Basophils Relative: 0.8 % (ref 0.0–3.0)
Eosinophils Absolute: 0.1 10*3/uL (ref 0.0–0.7)
Eosinophils Relative: 1.4 % (ref 0.0–5.0)
HCT: 46.8 % (ref 39.0–52.0)
Hemoglobin: 16.1 g/dL (ref 13.0–17.0)
Lymphocytes Relative: 17.3 % (ref 12.0–46.0)
Lymphs Abs: 1.3 10*3/uL (ref 0.7–4.0)
MCHC: 34.3 g/dL (ref 30.0–36.0)
MCV: 92.3 fl (ref 78.0–100.0)
Monocytes Absolute: 0.5 10*3/uL (ref 0.1–1.0)
Monocytes Relative: 7 % (ref 3.0–12.0)
Neutro Abs: 5.5 10*3/uL (ref 1.4–7.7)
Neutrophils Relative %: 73.5 % (ref 43.0–77.0)
Platelets: 162 10*3/uL (ref 150.0–400.0)
RBC: 5.08 Mil/uL (ref 4.22–5.81)
RDW: 13.9 % (ref 11.5–15.5)
WBC: 7.4 10*3/uL (ref 4.0–10.5)

## 2018-06-21 LAB — LIPID PANEL
Cholesterol: 140 mg/dL (ref 0–200)
HDL: 32 mg/dL — ABNORMAL LOW (ref >39.00)
LDL Cholesterol: 83 mg/dL (ref 0–99)
NonHDL: 107.95
Total CHOL/HDL Ratio: 4
Triglycerides: 126 mg/dL (ref 0.0–149.0)
VLDL: 25.2 mg/dL (ref 0.0–40.0)

## 2018-06-21 LAB — BASIC METABOLIC PANEL
BUN: 27 mg/dL — ABNORMAL HIGH (ref 6–23)
CO2: 29 mEq/L (ref 19–32)
Calcium: 9.2 mg/dL (ref 8.4–10.5)
Chloride: 102 mEq/L (ref 96–112)
Creatinine, Ser: 1.06 mg/dL (ref 0.40–1.50)
GFR: 70.82 mL/min (ref >60.00)
Glucose, Bld: 165 mg/dL — ABNORMAL HIGH (ref 70–99)
Potassium: 3.9 mEq/L (ref 3.5–5.1)
Sodium: 139 mEq/L (ref 135–145)

## 2018-06-21 LAB — HEMOGLOBIN A1C: Hgb A1c MFr Bld: 6.3 % (ref 4.6–6.5)

## 2018-06-22 ENCOUNTER — Encounter: Payer: Self-pay | Admitting: Internal Medicine

## 2018-06-22 ENCOUNTER — Ambulatory Visit (INDEPENDENT_AMBULATORY_CARE_PROVIDER_SITE_OTHER): Payer: BLUE CROSS/BLUE SHIELD | Admitting: Internal Medicine

## 2018-06-22 DIAGNOSIS — I25118 Atherosclerotic heart disease of native coronary artery with other forms of angina pectoris: Secondary | ICD-10-CM

## 2018-06-22 DIAGNOSIS — F419 Anxiety disorder, unspecified: Secondary | ICD-10-CM

## 2018-06-22 DIAGNOSIS — Z86010 Personal history of colonic polyps: Secondary | ICD-10-CM

## 2018-06-22 DIAGNOSIS — K2100 Gastro-esophageal reflux disease with esophagitis, without bleeding: Secondary | ICD-10-CM

## 2018-06-22 DIAGNOSIS — K5900 Constipation, unspecified: Secondary | ICD-10-CM

## 2018-06-22 DIAGNOSIS — I10 Essential (primary) hypertension: Secondary | ICD-10-CM

## 2018-06-22 DIAGNOSIS — D696 Thrombocytopenia, unspecified: Secondary | ICD-10-CM

## 2018-06-22 DIAGNOSIS — E782 Mixed hyperlipidemia: Secondary | ICD-10-CM

## 2018-06-22 DIAGNOSIS — R945 Abnormal results of liver function studies: Secondary | ICD-10-CM

## 2018-06-22 DIAGNOSIS — M25512 Pain in left shoulder: Secondary | ICD-10-CM

## 2018-06-22 DIAGNOSIS — Z1211 Encounter for screening for malignant neoplasm of colon: Secondary | ICD-10-CM | POA: Diagnosis not present

## 2018-06-22 DIAGNOSIS — E1159 Type 2 diabetes mellitus with other circulatory complications: Secondary | ICD-10-CM

## 2018-06-22 NOTE — Progress Notes (Signed)
Patient ID: Keith Shough., male   DOB: 27-Nov-1955, 63 y.o.   MRN: [id number] Virtual Visit via Video Note  This visit type was conducted due to national recommendations for restrictions regarding the COVID-19 pandemic (e.g. social distancing).  This format is felt to be most appropriate for this patient at this time.  All issues noted in this document were discussed and addressed.  No physical exam was performed (except for noted visual exam findings with Video Visits).   I connected with Keith Gross by a video enabled telemedicine application and verified that I am speaking with the correct person using two identifiers. Location patient: home Location provider: work  Persons participating in the virtual visit: patient, provider  I discussed the limitations, risks, security and privacy concerns of performing an evaluation and management service by video and the availability of in person appointments.  The patient expressed understanding and agreed to proceed.   Reason for visit: scheduled follow up.    HPI: He reports he is doing relatively well.  Last saw Dr Rockey Situ 10/2017 for f/u CAD.  Stable.  Is followed by urology.  On flomax.  Recent labs revealed testosterone 609 and PSA .3.  Symptoms stable.  He is also followed by Dr Nicolasa Ducking for depression and anxiety.  Has done well on his current medication regimen.  Just evaluated by her last month.  No changes made.  He does report that he was cutting a tree limb approximately 3 weeks ago and it fell on his back/left shoulder.  He is doing better.  Back pain has resolved.  Good rom in left shoulder.  Still some discomfort if he is holding weights and reaching up or out.  Taking increased antiinflammatories (1600-1800mg  per day).  Also taking tylenol.  Plans to go to physical therapy when COVID restrictions are lifted. States he has had similar injury previously and therapy worked well for him.  No chest pain.  No cough or chest congestion.  No  sob.  No acid reflux reported.  Does report bowels move slower.  Takes miralax dialy.  Also uses Hardin Negus MOM prn.  Has bowel movement most days.  Some increased gas.  Due a colonoscopy.  No abdominal pain or cramping.  No blood in the stool. Discussed labs.  Triglycerides and HDL improved.      ROS: See pertinent positives and negatives per HPI.  Past Medical History:  Diagnosis Date  . Acid reflux   . Angina   . Anxiety   . Arthritis   . Diastasis of muscle 2011   "abdominal"  . Dysplastic polyp of colon 2008   [facility]  . GERD (gastroesophageal reflux disease)   . Gout    Multiple episodes  . Heart disease   . Hyperlipidemia   . Hypertension   . Kidney stones   . Myocardial infarction [facility]) 05/2011   "we think";  06/2011 abnl mv;  06/2011 Cath - subtotal occlusion of Ramus, otw nonobs dzs;  06/2011 PCI/DES of Ramus w/ 2.25x29mm Resolute DES   . Sleep apnea    on CPAP  . Syncopal episodes   . Treadmill stress test negative for angina pectoris 2011    Past Surgical History:  Procedure Laterality Date  . CARDIAC CATHETERIZATION  06/2011   "diagnostic"  . COLONOSCOPY    . CORONARY ANGIOPLASTY WITH STENT PLACEMENT  07/09/11   "1"  . HEMORRHOID SURGERY  2000  . [place]   left  . LEFT  HEART CATHETERIZATION WITH CORONARY ANGIOGRAM N/A 10/21/2011   Procedure: LEFT HEART CATHETERIZATION WITH CORONARY ANGIOGRAM;  Surgeon: Hillary Bow, MD;  Location: [facility] CATH LAB;  Service: Cardiovascular;  Laterality: N/A;  . LITHOTRIPSY  10/2008  . PERCUTANEOUS CORONARY STENT INTERVENTION (PCI-S) N/A 07/09/2011   Procedure: PERCUTANEOUS CORONARY STENT INTERVENTION (PCI-S);  Surgeon: Sherren Mocha, MD;  Location: [facility] CATH LAB;  Service: Cardiovascular;  Laterality: N/A;  . UPPER GI ENDOSCOPY    . URETEROSCOPY  10/2009    Family History  Problem Relation Age of Onset  . Heart attack Father 2       LVAD  . Hypertension Other     SOCIAL HX: reviewed.    Current Outpatient  Medications:  .  allopurinol (ZYLOPRIM) 100 MG tablet, TAKE ONE TABLET TWICE DAILY, Disp: 180 tablet, Rfl: 1 .  amLODipine (NORVASC) 10 MG tablet, TAKE ONE TABLET EVERY DAY, Disp: 90 tablet, Rfl: 1 .  atorvastatin (LIPITOR) 20 MG tablet, Take 1 tablet (20 mg total) by mouth daily., Disp: 90 tablet, Rfl: 3 .  cetirizine (ZYRTEC) 10 MG tablet, Take 10 mg by mouth daily., Disp: , Rfl:  .  CINNAMON PO, Take 1,000 mg by mouth 2 (two) times daily. , Disp: , Rfl:  .  clopidogrel (PLAVIX) 75 MG tablet, Take 1 tablet (75 mg total) by mouth daily., Disp: 90 tablet, Rfl: 3 .  fluticasone (FLONASE) 50 MCG/ACT nasal spray, USE 2 SPRAYS IN EACH NOSTRIL DAILY AS DIRECTED, Disp: 48 g, Rfl: 1 .  glucose blood test strip, Use as instructed to check sugars once daily. Dx E11.9, Disp: 100 each, Rfl: 12 .  hydrochlorothiazide (HYDRODIURIL) 25 MG tablet, TAKE ONE TABLET AT BEDTIME, Disp: 90 tablet, Rfl: 3 .  hydrOXYzine (ATARAX/VISTARIL) 50 MG tablet, Take 50 mg by mouth 2 (two) times daily., Disp: , Rfl:  .  lamoTRIgine (LAMICTAL) 100 MG tablet, Takes 2 tablets am and 1 tablet pm daily., Disp: , Rfl: 0 .  Lancets (ONETOUCH ULTRASOFT) lancets, Use as instructed to check sugars once daily. Dx E 11.9, Disp: 100 each, Rfl: 12 .  magnesium oxide (MAG-OX) 400 MG tablet, Take 400 mg by mouth daily., Disp: , Rfl:  .  NEEDLE, DISP, 16 G 16G X 1" MISC, FOR TESTOSTERONE INJECTIONS, Disp: 25 each, Rfl: 3 .  NEOMYCIN-POLYMYXIN-HYDROCORTISONE (CORTISPORIN) 1 % SOLN OTIC solution, Apply 1-2 drops to toe BID after soaking, Disp: 10 mL, Rfl: 1 .  nitroGLYCERIN (NITROSTAT) 0.4 MG SL tablet, Place 1 tablet (0.4 mg total) under the tongue every 5 (five) minutes as needed for chest pain., Disp: 25 tablet, Rfl: 2 .  olmesartan-hydrochlorothiazide (BENICAR HCT) 40-25 MG tablet, TAKE ONE TABLET EVERY DAY, Disp: 90 tablet, Rfl: 0 .  pantoprazole (PROTONIX) 40 MG tablet, TAKE ONE TABLET EVERY DAY, Disp: 90 tablet, Rfl: 1 .  polyethylene glycol  powder (GLYCOLAX/MIRALAX) powder, TAKE 17 GRAMS BY MOUTH EVERY DAY as needed (as directed), Disp: 527 g, Rfl: 1 .  potassium citrate (UROCIT-K) 10 MEQ (1080 MG) SR tablet, TAKE 1 TABLET BY MOUTH TWICE DAILY, Disp: 180 each, Rfl: 0 .  Psyllium (METAMUCIL PO), Take by mouth., Disp: , Rfl:  .  Syringe, Disposable, (B-D SYRINGE LUER-LOK 3CC) 3 ML MISC, 1 mL by Does not apply route every 14 (fourteen) days., Disp: 25 each, Rfl: 0 .  tamsulosin (FLOMAX) 0.4 MG CAPS capsule, TAKE 1 CAPSULE EVERY DAY, Disp: 90 capsule, Rfl: 1 .  testosterone cypionate (DEPOTESTOSTERONE CYPIONATE) 200 MG/ML injection, Inject 0.5 mLs (100  mg total) into the muscle once a week., Disp: 10 mL, Rfl: 0 .  TRAZODONE HCL PO, Take 200 mg by mouth at bedtime., Disp: , Rfl:  .  venlafaxine XR (EFFEXOR-XR) 150 MG 24 hr capsule, Take 150 mg by mouth daily with breakfast., Disp: , Rfl:  .  VITAMIN D, CHOLECALCIFEROL, PO, Take 2,000 mg by mouth daily., Disp: , Rfl:  .  VITAMIN E PO, Take 2,000 Units by mouth daily., Disp: , Rfl:   EXAM:  GENERAL: alert, oriented, appears well and in no acute distress  HEENT: atraumatic, conjunttiva clear, no obvious abnormalities on inspection of external nose and ears  NECK: normal movements of the head and neck  LUNGS: on inspection no signs of respiratory distress, breathing rate appears normal, no obvious gross SOB, gasping or wheezing  CV: no obvious cyanosis  MS: good rom in left arm.   PSYCH/NEURO: pleasant and cooperative, no obvious depression or anxiety, speech and thought processing grossly intact  ASSESSMENT AND PLAN:  Discussed the following assessment and plan:  Colon cancer screening - Plan: Ambulatory referral to Gastroenterology  Abnormal liver function  Anxiety  Coronary artery disease of native artery of native heart with stable angina pectoris (HCC)  Constipation, unspecified constipation type - Plan: Ambulatory referral to Gastroenterology  Diabetes mellitus  with cardiac complication ([place]) - Plan: Hemoglobin A1c  Gastroesophageal reflux disease with esophagitis  History of colonic polyps - Plan: Ambulatory referral to Gastroenterology  Mixed hyperlipidemia - Plan: Hepatic function panel, Lipid panel  Essential hypertension - Plan: TSH, Basic metabolic panel  Thrombocytopenia (HCC)  Left shoulder pain, unspecified chronicity - S/p injury.  Good rom.  Planning to f/u with chiropractor.  Desires no further intervention.   Abnormal liver function AST just slightly increased (40) on recent labs 5/4/0.  Low carb diet, exercise and weight loss.  Follow.   Anxiety Followed by Dr Nicolasa Ducking.  Stable on current medications.    CAD (coronary artery disease) Followed by Dr Rockey Situ.  Continue risk factor modification.  Stable.   Constipation Takes miralax regularly.  [place] MOM prn.  Some change for him.  States due f/u colonoscopy.  Request referral back to Dr Santa Lighter.    Diabetes mellitus with cardiac complication (HCC) Discussed low carb diet and exercise.  Recent a1c 6.3.  Follow.    GERD (gastroesophageal reflux disease) Controlled.  Follow.   History of colonic polyps Colonoscopy 08/2013 - one 58mm polyp in the rectum.  A tatoo was seen in the transverse colon.  Post- polypectomy scar found at tatoo site.  No residual polyp tissue.  Internal hemorrhoids.  Due this year for f/u colonoscopy.  Having some bowel change with some increased constipation as outlined.  Refer to GI - Dr Santa Lighter.    Hyperlipidemia On lipitor.  Low cholesterol diet and exercise.  Follow lipid panel and liver function tests.   Lab Results  Component Value Date   CHOL 140 06/21/2018   HDL 32.00 (L) 06/21/2018   LDLCALC 83 06/21/2018   LDLDIRECT 73.0 12/15/2017   TRIG 126.0 06/21/2018   CHOLHDL 4 06/21/2018    Hypertension Blood pressure has been under good control.  Continue same medication regimen.  Follow pressures.  Follow metabolic panel.     Thrombocytopenia Platelet count wnl 06/21/18.      I discussed the assessment and treatment plan with the patient. The patient was provided an opportunity to ask questions and all were answered. The patient agreed with the plan and  demonstrated an understanding of the instructions.   The patient was advised to call back or seek an in-person evaluation if the symptoms worsen or if the condition fails to improve as anticipated.    Einar Pheasant, MD

## 2018-06-26 ENCOUNTER — Encounter: Payer: Self-pay | Admitting: Internal Medicine

## 2018-06-26 NOTE — Assessment & Plan Note (Signed)
Controlled.  Follow.   

## 2018-06-26 NOTE — Assessment & Plan Note (Signed)
Followed by Dr Rockey Situ.  Continue risk factor modification.  Stable.

## 2018-06-26 NOTE — Assessment & Plan Note (Signed)
On lipitor.  Low cholesterol diet and exercise.  Follow lipid panel and liver function tests.   Lab Results  Component Value Date   CHOL 140 06/21/2018   HDL 32.00 (L) 06/21/2018   LDLCALC 83 06/21/2018   LDLDIRECT 73.0 12/15/2017   TRIG 126.0 06/21/2018   CHOLHDL 4 06/21/2018

## 2018-06-26 NOTE — Assessment & Plan Note (Signed)
Discussed low carb diet and exercise.  Recent a1c 6.3.  Follow.

## 2018-06-26 NOTE — Assessment & Plan Note (Signed)
AST just slightly increased (40) on recent labs 5/4/0.  Low carb diet, exercise and weight loss.  Follow.

## 2018-06-26 NOTE — Assessment & Plan Note (Signed)
Takes miralax regularly.  Phillips MOM prn.  Some change for him.  States due f/u colonoscopy.  Request referral back to Dr Santa Lighter.

## 2018-06-26 NOTE — Assessment & Plan Note (Signed)
Colonoscopy 08/2013 - one 34mm polyp in the rectum.  A tatoo was seen in the transverse colon.  Post- polypectomy scar found at tatoo site.  No residual polyp tissue.  Internal hemorrhoids.  Due this year for f/u colonoscopy.  Having some bowel change with some increased constipation as outlined.  Refer to GI - Dr Santa Lighter.

## 2018-06-26 NOTE — Assessment & Plan Note (Signed)
Platelet count wnl 06/21/18.

## 2018-06-26 NOTE — Assessment & Plan Note (Signed)
Followed by Dr Nicolasa Ducking.  Stable on current medications.

## 2018-06-26 NOTE — Assessment & Plan Note (Signed)
Blood pressure has been under good control.  Continue same medication regimen.  Follow pressures.  Follow metabolic panel.   

## 2018-07-05 ENCOUNTER — Encounter: Payer: Self-pay | Admitting: Urology

## 2018-07-14 ENCOUNTER — Ambulatory Visit: Payer: BLUE CROSS/BLUE SHIELD | Admitting: Urology

## 2018-07-18 ENCOUNTER — Encounter: Payer: Self-pay | Admitting: Urology

## 2018-07-22 ENCOUNTER — Other Ambulatory Visit: Payer: Self-pay | Admitting: Internal Medicine

## 2018-08-04 ENCOUNTER — Ambulatory Visit: Payer: BLUE CROSS/BLUE SHIELD | Admitting: Urology

## 2018-08-16 ENCOUNTER — Encounter: Payer: Self-pay | Admitting: Urology

## 2018-08-16 ENCOUNTER — Other Ambulatory Visit: Payer: Self-pay | Admitting: Urology

## 2018-08-16 ENCOUNTER — Other Ambulatory Visit: Payer: Self-pay | Admitting: Family Medicine

## 2018-08-16 MED ORDER — TESTOSTERONE CYPIONATE 200 MG/ML IM SOLN: 100.0000 mg | INTRAMUSCULAR | 0 refills | Status: DC

## 2018-08-30 ENCOUNTER — Encounter: Payer: Self-pay | Admitting: Urology

## 2018-08-30 ENCOUNTER — Ambulatory Visit: Payer: BLUE CROSS/BLUE SHIELD | Admitting: Urology

## 2018-08-30 ENCOUNTER — Other Ambulatory Visit: Payer: Self-pay

## 2018-08-30 VITALS — BP 119/69 | HR 71 | Ht 72.0 in | Wt 220.0 lb

## 2018-08-30 DIAGNOSIS — N401 Enlarged prostate with lower urinary tract symptoms: Secondary | ICD-10-CM

## 2018-08-30 DIAGNOSIS — E291 Testicular hypofunction: Secondary | ICD-10-CM

## 2018-08-30 DIAGNOSIS — Z87442 Personal history of urinary calculi: Secondary | ICD-10-CM | POA: Diagnosis not present

## 2018-08-30 NOTE — Progress Notes (Signed)
08/30/2018 8:39 AM   Keith Gross 1955-04-07 [id number]  Referring provider: Einar Pheasant, [address] [place],  [place] [address]  Chief Complaint  Patient presents with  . Hypogonadism   Urologic history: 1.  Hypogonadism  -On TRT 100 mg testosterone cypionate weekly  2.  BPH with lower urinary tract symptoms  -Tamsulosin 0.4 mg daily  3.  History of stone disease  -Prior shockwave lithotripsy/ureteroscopy  4.  Erectile dysfunction  -Prior penile implant Dr. Gareth Eagle at [facility]   HPI: 63 year old male presents for annual follow-up.  Since his last visit he has no complaints.  He remains on tamsulosin.  IPSS completed today was 1/0. Denies dysuria, gross hematuria or flank/abdominal/pelvic/scrotal pain.  Hematocrit performed in Dr. Bary Leriche office in May was normal.   PMH: Past Medical History:  Diagnosis Date  . Acid reflux   . Angina   . Anxiety   . Arthritis   . Diastasis of muscle 2011   "abdominal"  . Dysplastic polyp of colon 2008   [facility]  . GERD (gastroesophageal reflux disease)   . Gout    Multiple episodes  . Heart disease   . Hyperlipidemia   . Hypertension   . Kidney stones   . Myocardial infarction [facility]) 05/2011   "we think";  06/2011 abnl mv;  06/2011 Cath - subtotal occlusion of Ramus, otw nonobs dzs;  06/2011 PCI/DES of Ramus w/ 2.25x60mm Resolute DES   . Sleep apnea    on CPAP  . Syncopal episodes   . Treadmill stress test negative for angina pectoris 2011    Surgical History: Past Surgical History:  Procedure Laterality Date  . CARDIAC CATHETERIZATION  06/2011   "diagnostic"  . COLONOSCOPY    . CORONARY ANGIOPLASTY WITH STENT PLACEMENT  07/09/11   "1"  . HEMORRHOID SURGERY  2000  . [place]   left  . LEFT HEART CATHETERIZATION WITH CORONARY ANGIOGRAM N/A 10/21/2011   Procedure: LEFT HEART CATHETERIZATION WITH CORONARY ANGIOGRAM;  Surgeon: Hillary Bow, MD;  Location: [facility] CATH LAB;   Service: Cardiovascular;  Laterality: N/A;  . LITHOTRIPSY  10/2008  . PERCUTANEOUS CORONARY STENT INTERVENTION (PCI-S) N/A 07/09/2011   Procedure: PERCUTANEOUS CORONARY STENT INTERVENTION (PCI-S);  Surgeon: Sherren Mocha, MD;  Location: [facility] CATH LAB;  Service: Cardiovascular;  Laterality: N/A;  . UPPER GI ENDOSCOPY    . URETEROSCOPY  10/2009    Home Medications:  Allergies as of 08/30/2018   No Known Allergies     Medication List       Accurate as of August 30, 2018  8:39 AM. If you have any questions, ask your nurse or doctor.        STOP taking these medications   allopurinol 100 MG tablet Commonly known as: ZYLOPRIM Stopped by: Abbie Sons, MD     TAKE these medications   amLODipine 10 MG tablet Commonly known as: NORVASC TAKE ONE TABLET EVERY DAY   atorvastatin 20 MG tablet Commonly known as: LIPITOR Take 1 tablet (20 mg total) by mouth daily.   cetirizine 10 MG tablet Commonly known as: ZYRTEC Take 10 mg by mouth daily.   CINNAMON PO Take 1,000 mg by mouth 2 (two) times daily.   clopidogrel 75 MG tablet Commonly known as: PLAVIX Take 1 tablet (75 mg total) by mouth daily.   fluticasone 50 MCG/ACT nasal spray Commonly known as: FLONASE USE 2 SPRAYS IN EACH NOSTRIL DAILY AS DIRECTED   glucose  blood test strip Use as instructed to check sugars once daily. Dx E11.9   hydrochlorothiazide 25 MG tablet Commonly known as: HYDRODIURIL TAKE ONE TABLET AT BEDTIME   hydrOXYzine 50 MG capsule Commonly known as: VISTARIL   hydrOXYzine 50 MG tablet Commonly known as: ATARAX/VISTARIL Take 50 mg by mouth 2 (two) times daily.   lamoTRIgine 100 MG tablet Commonly known as: LAMICTAL Takes 2 tablets am and 1 tablet pm daily.   magnesium oxide 400 MG tablet Commonly known as: MAG-OX Take 400 mg by mouth daily.   METAMUCIL PO Take by mouth.   NEEDLE (DISP) 16 G 16G X 1" Misc FOR TESTOSTERONE INJECTIONS   NEOMYCIN-POLYMYXIN-HYDROCORTISONE 1 % Soln OTIC  solution Commonly known as: CORTISPORIN Apply 1-2 drops to toe BID after soaking   nitroGLYCERIN 0.4 MG SL tablet Commonly known as: Nitrostat Place 1 tablet (0.4 mg total) under the tongue every 5 (five) minutes as needed for chest pain.   olmesartan-hydrochlorothiazide 40-25 MG tablet Commonly known as: BENICAR HCT TAKE ONE TABLET EVERY DAY   onetouch ultrasoft lancets Use as instructed to check sugars once daily. Dx E 11.9   pantoprazole 40 MG tablet Commonly known as: PROTONIX TAKE ONE TABLET EVERY DAY   polyethylene glycol powder 17 GM/SCOOP powder Commonly known as: GLYCOLAX/MIRALAX TAKE 17 GRAMS BY MOUTH EVERY DAY as needed (as directed)   potassium citrate 10 MEQ (1080 MG) SR tablet Commonly known as: UROCIT-K TAKE 1 TABLET BY MOUTH TWICE DAILY   Syringe (Disposable) 3 ML Misc Commonly known as: B-D SYRINGE LUER-LOK 3CC 1 mL by Does not apply route every 14 (fourteen) days.   tamsulosin 0.4 MG Caps capsule Commonly known as: FLOMAX TAKE 1 CAPSULE EVERY DAY   testosterone cypionate 200 MG/ML injection Commonly known as: DEPOTESTOSTERONE CYPIONATE Inject 0.5 mLs (100 mg total) into the muscle once a week.   traZODone 100 MG tablet Commonly known as: DESYREL What changed: Another medication with the same name was removed. Continue taking this medication, and follow the directions you see here. Changed by: Abbie Sons, MD   venlafaxine XR 150 MG 24 hr capsule Commonly known as: EFFEXOR-XR Take 150 mg by mouth daily with breakfast.   VITAMIN D (CHOLECALCIFEROL) PO Take 2,000 mg by mouth daily.   VITAMIN E PO Take 2,000 Units by mouth daily.       Allergies: No Known Allergies  Family History: Family History  Problem Relation Age of Onset  . Heart attack Father 81       LVAD  . Hypertension Other     Social History:  reports that he quit smoking about 28 years ago. His smoking use included cigarettes. He has a 10.00 pack-year smoking history.  He has never used smokeless tobacco. He reports current alcohol use of about 4.0 standard drinks of alcohol per week. He reports current drug use. Frequency: 7.00 times per week. Drug: Marijuana.  ROS: UROLOGY Frequent Urination?: No Hard to postpone urination?: No Burning/pain with urination?: No Get up at night to urinate?: No Leakage of urine?: No Urine stream starts and stops?: No Trouble starting stream?: No Do you have to strain to urinate?: No Blood in urine?: No Urinary tract infection?: No Sexually transmitted disease?: No Injury to kidneys or bladder?: No Painful intercourse?: No Weak stream?: No Erection problems?: No Penile pain?: No  Gastrointestinal Nausea?: No Vomiting?: No Indigestion/heartburn?: No Diarrhea?: No Constipation?: No  Constitutional Fever: No Night sweats?: No Weight loss?: No Fatigue?: No  Skin Skin  rash/lesions?: No Itching?: No  Eyes Blurred vision?: No Double vision?: No  Ears/Nose/Throat Sore throat?: No Sinus problems?: No  Hematologic/Lymphatic Swollen glands?: No Easy bruising?: No  Cardiovascular Leg swelling?: No Chest pain?: No  Respiratory Cough?: No Shortness of breath?: No  Endocrine Excessive thirst?: No  Musculoskeletal Back pain?: No Joint pain?: No  Neurological Headaches?: No Dizziness?: No  Psychologic Depression?: No Anxiety?: No  Physical Exam: BP 119/69 (BP Location: Left Arm, Patient Position: Sitting, Cuff Size: Normal)   Pulse 71   Ht 6' (1.829 m)   Wt 220 lb (99.8 kg)   BMI 29.84 kg/m   Constitutional:  Alert and oriented, No acute distress. HEENT: [place] AT, moist mucus membranes.  Trachea midline, no masses. Cardiovascular: No clubbing, cyanosis, or edema. Respiratory: Normal respiratory effort, no increased work of breathing. GU: No CVA tenderness.  Prostate 30 g, smooth without nodules Skin: No rashes, bruises or suspicious lesions. Neurologic: Grossly intact, no focal  deficits, moving all 4 extremities. Psychiatric: Normal mood and affect.   Assessment & Plan:   63 year old male with hypogonadism on TRT.  Monitoring blood work drawn today including PSA and testosterone.  Follow-up 1 year with an interim six-month visit for PSA/hematocrit.  Last KUB was over 2 years ago and was ordered.   Abbie Sons, [place] [address] [place], [place] [address] [phone]

## 2018-08-31 LAB — PSA: Prostate Specific Ag, Serum: 0.4 ng/mL (ref 0.0–4.0)

## 2018-08-31 LAB — TESTOSTERONE: Testosterone: 454 ng/dL (ref 264–916)

## 2018-09-01 ENCOUNTER — Telehealth: Payer: Self-pay

## 2018-09-01 NOTE — Telephone Encounter (Signed)
-----   Message from Abbie Sons, MD sent at 08/31/2018  5:00 PM EDT ----- Testosterone level was 454.  PSA stable at 0.4

## 2018-09-01 NOTE — Telephone Encounter (Signed)
mychart notification sent 

## 2018-09-07 ENCOUNTER — Other Ambulatory Visit: Payer: Self-pay | Admitting: Cardiovascular Disease

## 2018-09-14 ENCOUNTER — Other Ambulatory Visit: Payer: Self-pay | Admitting: Internal Medicine

## 2018-09-30 ENCOUNTER — Encounter: Payer: Self-pay | Admitting: Internal Medicine

## 2018-09-30 MED ORDER — AMLODIPINE BESYLATE 10 MG PO TABS: 10.0000 mg | ORAL_TABLET | Freq: Every day | ORAL | 0 refills | Status: DC

## 2018-09-30 MED ORDER — HYDROCHLOROTHIAZIDE 25 MG PO TABS: 25.0000 mg | ORAL_TABLET | Freq: Every day | ORAL | 0 refills | Status: DC

## 2018-09-30 MED ORDER — OLMESARTAN MEDOXOMIL-HCTZ 40-25 MG PO TABS: 1.0000 | ORAL_TABLET | Freq: Every day | ORAL | 0 refills | Status: DC

## 2018-10-01 ENCOUNTER — Other Ambulatory Visit: Payer: Self-pay

## 2018-10-01 MED ORDER — PANTOPRAZOLE SODIUM 40 MG PO TBEC: 40.0000 mg | DELAYED_RELEASE_TABLET | Freq: Every day | ORAL | 1 refills | Status: DC

## 2018-10-19 ENCOUNTER — Encounter: Payer: Self-pay | Admitting: Internal Medicine

## 2018-10-21 ENCOUNTER — Other Ambulatory Visit: Payer: Self-pay

## 2018-10-21 MED ORDER — POTASSIUM CITRATE ER 10 MEQ (1080 MG) PO TBCR: 10.0000 meq | EXTENDED_RELEASE_TABLET | Freq: Two times a day (BID) | ORAL | 1 refills | Status: DC

## 2018-11-13 NOTE — Progress Notes (Signed)
Patient ID: Keith Gross., male   DOB: 07/20/1955, 63 y.o.   MRN: [id number] Cardiology Office Note  Date:  11/15/2018   ID:  Keith Gross., DOB 06-08-1955, MRN [id number]  PCP:  Keith Pheasant, MD   Chief Complaint  Patient presents with  . other    12 mo f/u. Medications reviewed verbally."Doing well."    HPI:  Keith Gross is a very pleasant 63 year old gentleman syncope in 2010 felt secondary to low blood pressure that improved with medication changes,  chest pain symptoms at the beginning of April 2013,  stress test showing inferior lateral hypoperfusion consistent with large region of scar which was new from 2010,  cardiac catheterization showing severe ramus disease, with DES stent placed in May 2013. catheterization September 2013 for chest pain showing patent stent, distal left circumflex disease noted, moderate in nature. He presents today for follow-up of his coronary artery disease  Reports feeling well no complaints Weight is down 10 pounds from prior clinic visit 1 year ago Continues to work out with a Investment banker, operational his diet  Continues to work, Technical sales engineer  In the past reported periodic follow-up with psychiatry for outbursts of anger management  Reports blood pressure stable Reports compliance with his Lipitor  Lab work reviewed with him LDL above 80, previously in the 60s  EKG personally reviewed by myself on todays visit Shows normal sinus rhythm rate 71 bpm no significant ST or T wave changes, right bundle branch block  Other past medical history reviewed Previous surgery for ED with implant down place. Overall has healed well, happy with his decision. He is back at work, denies any significant chest pain concerning for angina. Has good energy Is wearing his CPAP for sleep apnea  He does have a previous history of smoking, quit in 1992. Father had his first heart attack at age 37.  PMH:   has a past medical history of Acid reflux,  Angina, Anxiety, Arthritis, Diastasis of muscle (2011), Dysplastic polyp of colon (2008), GERD (gastroesophageal reflux disease), Gout, Heart disease, Hyperlipidemia, Hypertension, Kidney stones, Myocardial infarction (Keith Gross) (05/2011), Sleep apnea, Syncopal episodes, and Treadmill stress test negative for angina pectoris (2011).  PSH:    Past Surgical History:  Procedure Laterality Date  . CARDIAC CATHETERIZATION  06/2011   "diagnostic"  . COLONOSCOPY    . CORONARY ANGIOPLASTY WITH STENT PLACEMENT  07/09/11   "1"  . HEMORRHOID SURGERY  2000  . [place]   left  . LEFT HEART CATHETERIZATION WITH CORONARY ANGIOGRAM N/A 10/21/2011   Procedure: LEFT HEART CATHETERIZATION WITH CORONARY ANGIOGRAM;  Surgeon: Hillary Bow, MD;  Location: [facility] CATH LAB;  Service: Cardiovascular;  Laterality: N/A;  . LITHOTRIPSY  10/2008  . PERCUTANEOUS CORONARY STENT INTERVENTION (PCI-S) N/A 07/09/2011   Procedure: PERCUTANEOUS CORONARY STENT INTERVENTION (PCI-S);  Surgeon: Sherren Mocha, MD;  Location: [facility] CATH LAB;  Service: Cardiovascular;  Laterality: N/A;  . UPPER GI ENDOSCOPY    . URETEROSCOPY  10/2009    Current Outpatient Medications  Medication Sig Dispense Refill  . amLODipine (NORVASC) 10 MG tablet Take 1 tablet (10 mg total) by mouth daily. 90 tablet 0  . atorvastatin (LIPITOR) 20 MG tablet Take 1 tablet (20 mg total) by mouth daily. 90 tablet 3  . cetirizine (ZYRTEC) 10 MG tablet Take 10 mg by mouth daily.    Marland Kitchen CINNAMON PO Take 1,000 mg by mouth 2 (two) times daily.     . clopidogrel (  PLAVIX) 75 MG tablet Take 1 tablet (75 mg total) by mouth daily. 90 tablet 3  . fluticasone (FLONASE) 50 MCG/ACT nasal spray USE 2 SPRAYS IN EACH NOSTRIL DAILY AS DIRECTED 48 g 1  . glucose blood test strip Use as instructed to check sugars once daily. Dx E11.9 100 each 12  . hydrochlorothiazide (HYDRODIURIL) 25 MG tablet Take 1 tablet (25 mg total) by mouth at bedtime. 90 tablet 0  . hydrOXYzine  (ATARAX/VISTARIL) 50 MG tablet Take 50 mg by mouth 2 (two) times daily.    . hydrOXYzine (VISTARIL) 50 MG capsule     . lamoTRIgine (LAMICTAL) 100 MG tablet Takes 2 tablets am and 1 tablet pm daily.  0  . Lancets (ONETOUCH ULTRASOFT) lancets Use as instructed to check sugars once daily. Dx E 11.9 100 each 12  . magnesium oxide (MAG-OX) 400 MG tablet Take 400 mg by mouth daily.    Marland Kitchen NEEDLE, DISP, 16 G 16G X 1" MISC FOR TESTOSTERONE INJECTIONS 25 each 3  . NEOMYCIN-POLYMYXIN-HYDROCORTISONE (CORTISPORIN) 1 % SOLN OTIC solution Apply 1-2 drops to toe BID after soaking 10 mL 1  . nitroGLYCERIN (NITROSTAT) 0.4 MG SL tablet Place 1 tablet (0.4 mg total) under the tongue every 5 (five) minutes as needed for chest pain. 25 tablet 2  . olmesartan-hydrochlorothiazide (BENICAR HCT) 40-25 MG tablet Take 1 tablet by mouth daily. 90 tablet 0  . pantoprazole (PROTONIX) 40 MG tablet Take 1 tablet (40 mg total) by mouth daily. 90 tablet 1  . polyethylene glycol powder (GLYCOLAX/MIRALAX) powder TAKE 17 GRAMS BY MOUTH EVERY DAY as needed (as directed) 527 g 1  . potassium citrate (UROCIT-K) 10 MEQ (1080 MG) SR tablet Take 1 tablet (10 mEq total) by mouth 2 (two) times daily. 180 tablet 1  . Psyllium (METAMUCIL PO) Take by mouth.    . Syringe, Disposable, (B-D SYRINGE LUER-LOK 3CC) 3 ML MISC 1 mL by Does not apply route every 14 (fourteen) days. 25 each 0  . tamsulosin (FLOMAX) 0.4 MG CAPS capsule TAKE 1 CAPSULE BY MOUTH EVERY DAY 90 capsule 1  . testosterone cypionate (DEPOTESTOSTERONE CYPIONATE) 200 MG/ML injection Inject 0.5 mLs (100 mg total) into the muscle once a week. 10 mL 0  . traZODone (DESYREL) 100 MG tablet     . venlafaxine XR (EFFEXOR-XR) 150 MG 24 hr capsule Take 150 mg by mouth daily with breakfast.    . VITAMIN D, CHOLECALCIFEROL, PO Take 2,000 mg by mouth daily.    Marland Kitchen VITAMIN E PO Take 2,000 Units by mouth daily.     No current facility-administered medications for this visit.     Allergies:    Patient has no known allergies.   Social History:  The patient  reports that he quit smoking about 28 years ago. His smoking use included cigarettes. He has a 10.00 pack-year smoking history. He has never used smokeless tobacco. He reports current alcohol use of about 4.0 standard drinks of alcohol per week. He reports current drug use. Frequency: 7.00 times per week. Drug: Marijuana.   Family History:   family history includes Heart attack (age of onset: 38) in his father; Hypertension in an other family member.    Review of Systems: Review of Systems  Constitutional: Negative.   Respiratory: Negative.   Cardiovascular: Negative.   Gastrointestinal: Negative.   Musculoskeletal: Negative.   Neurological: Negative.   Psychiatric/Behavioral: Negative.   All other systems reviewed and are negative.   PHYSICAL EXAM: VS:  BP 134/78 (  BP Location: Left Arm, Patient Position: Sitting, Cuff Size: Normal)   Pulse 70   Ht 6' (1.829 m)   Wt 218 lb (98.9 kg)   BMI 29.57 kg/m  , BMI Body mass index is 29.57 kg/m. Constitutional:  oriented to person, place, and time. No distress.  HENT:  Head: Grossly normal Eyes:  no discharge. No scleral icterus.  Neck: No JVD, no carotid bruits  Cardiovascular: Regular rate and rhythm, no murmurs appreciated Pulmonary/Chest: Clear to auscultation bilaterally, no wheezes or rails Abdominal: Soft.  no distension.  no tenderness.  Musculoskeletal: Normal range of motion Neurological:  normal muscle tone. Coordination normal. No atrophy Skin: Skin warm and dry Psychiatric: normal affect, pleasant   Recent Labs: 12/15/2017: TSH 2.97 06/21/2018: ALT 30; BUN 27; Creatinine, Ser 1.06; Hemoglobin 16.1; Platelets 162.0; Potassium 3.9; Sodium 139    Lipid Panel Lab Results  Component Value Date   CHOL 140 06/21/2018   HDL 32.00 (L) 06/21/2018   LDLCALC 83 06/21/2018   TRIG 126.0 06/21/2018      Wt Readings from Last 3 Encounters:  11/15/18 218 lb  (98.9 kg)  08/30/18 220 lb (99.8 kg)  12/15/17 228 lb 3.2 oz (103.5 kg)       ASSESSMENT AND PLAN:  Coronary artery disease due to calcified coronary lesion - Plan: EKG 12-Lead Doing well, no anginal symptoms, no further testing  Essential hypertension - Plan: EKG 12-Lead Blood pressure well controlled, BUN starting to climb, recommended he hold HCTZ once or twice a week  stable angina ([place]) Has not required nitro, exercising on a regular basis with no restrictions  Hyperlipidemia Discussed with him LDL is mildly elevated above 80 Stressed the importance of compliance with his Lipitor  Borderline diabetes Hemoglobin slowly trending downward with weight loss Weight down 10 pounds in the past year  Chest pressure Denies having any significant issues No further testing at this time  Anxiety Previously followed by psychiatry, does regular exercise Peer symptoms stable   Total encounter time more than 25 minutes  Greater than 50% was spent in counseling and coordination of care with the patient   Disposition:   F/U  12 months   No orders of the defined types were placed in this encounter.    Signed, Esmond Plants, M.D., Ph.D. 11/15/2018  [place], [place]

## 2018-11-15 ENCOUNTER — Ambulatory Visit (INDEPENDENT_AMBULATORY_CARE_PROVIDER_SITE_OTHER): Payer: BLUE CROSS/BLUE SHIELD | Admitting: Cardiovascular Disease

## 2018-11-15 ENCOUNTER — Other Ambulatory Visit: Payer: Self-pay

## 2018-11-15 ENCOUNTER — Encounter: Payer: Self-pay | Admitting: Cardiovascular Disease

## 2018-11-15 VITALS — BP 134/78 | HR 70 | Ht 72.0 in | Wt 218.0 lb

## 2018-11-15 DIAGNOSIS — I25118 Atherosclerotic heart disease of native coronary artery with other forms of angina pectoris: Secondary | ICD-10-CM | POA: Diagnosis not present

## 2018-11-15 DIAGNOSIS — E1159 Type 2 diabetes mellitus with other circulatory complications: Secondary | ICD-10-CM | POA: Diagnosis not present

## 2018-11-15 DIAGNOSIS — I10 Essential (primary) hypertension: Secondary | ICD-10-CM

## 2018-11-15 DIAGNOSIS — E782 Mixed hyperlipidemia: Secondary | ICD-10-CM | POA: Diagnosis not present

## 2018-11-15 DIAGNOSIS — R55 Syncope and collapse: Secondary | ICD-10-CM

## 2018-11-15 NOTE — Patient Instructions (Signed)

## 2018-12-20 ENCOUNTER — Encounter: Payer: Self-pay | Admitting: Internal Medicine

## 2018-12-21 ENCOUNTER — Other Ambulatory Visit: Payer: Self-pay

## 2018-12-21 MED ORDER — TAMSULOSIN HCL 0.4 MG PO CAPS: 0.4000 mg | ORAL_CAPSULE | Freq: Every day | ORAL | 1 refills | Status: DC

## 2018-12-22 ENCOUNTER — Other Ambulatory Visit: Payer: Self-pay | Admitting: Urology

## 2018-12-22 ENCOUNTER — Encounter: Payer: Self-pay | Admitting: Urology

## 2018-12-22 ENCOUNTER — Other Ambulatory Visit: Payer: Self-pay | Admitting: Cardiovascular Disease

## 2018-12-22 ENCOUNTER — Encounter: Payer: Self-pay | Admitting: *Deleted

## 2018-12-22 MED ORDER — TESTOSTERONE CYPIONATE 200 MG/ML IM SOLN: 100.0000 mg | INTRAMUSCULAR | 0 refills | Status: DC

## 2018-12-26 ENCOUNTER — Other Ambulatory Visit: Payer: Self-pay | Admitting: Urology

## 2018-12-27 ENCOUNTER — Encounter: Payer: Self-pay | Admitting: Internal Medicine

## 2018-12-29 ENCOUNTER — Other Ambulatory Visit: Payer: Self-pay | Admitting: Cardiovascular Disease

## 2018-12-29 MED ORDER — HYDROCHLOROTHIAZIDE 25 MG PO TABS: 25.0000 mg | ORAL_TABLET | Freq: Every day | ORAL | 2 refills | Status: DC

## 2018-12-31 DIAGNOSIS — G4700 Insomnia, unspecified: Secondary | ICD-10-CM | POA: Diagnosis not present

## 2018-12-31 DIAGNOSIS — F1220 Cannabis dependence, uncomplicated: Secondary | ICD-10-CM | POA: Diagnosis not present

## 2018-12-31 DIAGNOSIS — F3181 Bipolar II disorder: Secondary | ICD-10-CM | POA: Diagnosis not present

## 2019-01-03 ENCOUNTER — Other Ambulatory Visit (INDEPENDENT_AMBULATORY_CARE_PROVIDER_SITE_OTHER): Payer: BLUE CROSS/BLUE SHIELD

## 2019-01-03 ENCOUNTER — Other Ambulatory Visit: Payer: Self-pay

## 2019-01-03 DIAGNOSIS — E1159 Type 2 diabetes mellitus with other circulatory complications: Secondary | ICD-10-CM

## 2019-01-03 DIAGNOSIS — E782 Mixed hyperlipidemia: Secondary | ICD-10-CM | POA: Diagnosis not present

## 2019-01-03 DIAGNOSIS — I10 Essential (primary) hypertension: Secondary | ICD-10-CM

## 2019-01-03 LAB — HEPATIC FUNCTION PANEL
ALT: 24 U/L (ref 0–53)
AST: 23 U/L (ref 0–37)
Albumin: 4.6 g/dL (ref 3.5–5.2)
Alkaline Phosphatase: 44 U/L (ref 39–117)
Bilirubin, Direct: 0.1 mg/dL (ref 0.0–0.3)
Total Bilirubin: 0.5 mg/dL (ref 0.2–1.2)
Total Protein: 6.8 g/dL (ref 6.0–8.3)

## 2019-01-03 LAB — BASIC METABOLIC PANEL
BUN: 24 mg/dL — ABNORMAL HIGH (ref 6–23)
CO2: 31 mEq/L (ref 19–32)
Calcium: 9.4 mg/dL (ref 8.4–10.5)
Chloride: 99 mEq/L (ref 96–112)
Creatinine, Ser: 1.11 mg/dL (ref 0.40–1.50)
GFR: 67.04 mL/min (ref >60.00)
Glucose, Bld: 157 mg/dL — ABNORMAL HIGH (ref 70–99)
Potassium: 3.9 mEq/L (ref 3.5–5.1)
Sodium: 140 mEq/L (ref 135–145)

## 2019-01-03 LAB — LIPID PANEL
Cholesterol: 144 mg/dL (ref 0–200)
HDL: 29.9 mg/dL — ABNORMAL LOW (ref >39.00)
NonHDL: 114.3
Total CHOL/HDL Ratio: 5
Triglycerides: 224 mg/dL — ABNORMAL HIGH (ref 0.0–149.0)
VLDL: 44.8 mg/dL — ABNORMAL HIGH (ref 0.0–40.0)

## 2019-01-03 LAB — TSH: TSH: 3.06 u[IU]/mL (ref 0.35–4.50)

## 2019-01-03 LAB — HEMOGLOBIN A1C: Hgb A1c MFr Bld: 6.5 % (ref 4.6–6.5)

## 2019-01-03 LAB — LDL CHOLESTEROL, DIRECT: Direct LDL: 88 mg/dL

## 2019-01-05 ENCOUNTER — Encounter: Payer: Self-pay | Admitting: Internal Medicine

## 2019-01-05 ENCOUNTER — Ambulatory Visit (INDEPENDENT_AMBULATORY_CARE_PROVIDER_SITE_OTHER): Payer: BLUE CROSS/BLUE SHIELD | Admitting: Internal Medicine

## 2019-01-05 ENCOUNTER — Other Ambulatory Visit: Payer: Self-pay

## 2019-01-05 VITALS — BP 134/70 | HR 76 | Temp 97.9°F | Resp 16 | Ht 72.0 in | Wt 219.0 lb

## 2019-01-05 DIAGNOSIS — R945 Abnormal results of liver function studies: Secondary | ICD-10-CM | POA: Diagnosis not present

## 2019-01-05 DIAGNOSIS — F419 Anxiety disorder, unspecified: Secondary | ICD-10-CM | POA: Diagnosis not present

## 2019-01-05 DIAGNOSIS — I25118 Atherosclerotic heart disease of native coronary artery with other forms of angina pectoris: Secondary | ICD-10-CM

## 2019-01-05 DIAGNOSIS — E1159 Type 2 diabetes mellitus with other circulatory complications: Secondary | ICD-10-CM

## 2019-01-05 DIAGNOSIS — Z0000 Encounter for general adult medical examination without abnormal findings: Secondary | ICD-10-CM

## 2019-01-05 DIAGNOSIS — Z23 Encounter for immunization: Secondary | ICD-10-CM

## 2019-01-05 DIAGNOSIS — R739 Hyperglycemia, unspecified: Secondary | ICD-10-CM

## 2019-01-05 DIAGNOSIS — I10 Essential (primary) hypertension: Secondary | ICD-10-CM

## 2019-01-05 DIAGNOSIS — E782 Mixed hyperlipidemia: Secondary | ICD-10-CM

## 2019-01-05 DIAGNOSIS — K2100 Gastro-esophageal reflux disease with esophagitis, without bleeding: Secondary | ICD-10-CM

## 2019-01-05 DIAGNOSIS — G4730 Sleep apnea, unspecified: Secondary | ICD-10-CM

## 2019-01-05 NOTE — Progress Notes (Signed)
Patient ID: Keith Weedon., male   DOB: 1956-01-25, 63 y.o.   MRN: [id number]   Subjective:    Patient ID: Keith Carmickle., male    DOB: Jul 11, 1955, 63 y.o.   MRN: [id number]  HPI This visit occurred during the SARS-CoV-2 public health emergency.  Safety protocols were in place, including screening questions prior to the visit, additional usage of staff PPE, and extensive cleaning of exam room while observing appropriate contact time as indicated for disinfecting solutions.  Patient here for his physical exam.  Sees Dr Bernardo Heater - urology.   States he is doing well.  Handling stress. Work is busy, but going well.  Handling stress.  Sees Dr Nicolasa Ducking.  Doing well on current medication regimen.  Not exercising as much.  No chest pain.  No sob.  Plans to start exercising more.  No cough or congestion.  No acid reflux.  No abdominal pain.  Bowels moving.  Had referred him to GI.  Sees Dr Santa Lighter.  They contacted him and delayed his colonoscopy secondary to covid.  He plans to f/u after first of the year.    Past Medical History:  Diagnosis Date  . Acid reflux   . Angina   . Anxiety   . Arthritis   . Diastasis of muscle 2011   "abdominal"  . Dysplastic polyp of colon 2008   [facility]  . GERD (gastroesophageal reflux disease)   . Gout    Multiple episodes  . Heart disease   . Hyperlipidemia   . Hypertension   . Kidney stones   . Myocardial infarction [facility]) 05/2011   "we think";  06/2011 abnl mv;  06/2011 Cath - subtotal occlusion of Ramus, otw nonobs dzs;  06/2011 PCI/DES of Ramus w/ 2.25x62m Resolute DES   . Sleep apnea    on CPAP  . Syncopal episodes   . Treadmill stress test negative for angina pectoris 2011   Past Surgical History:  Procedure Laterality Date  . CARDIAC CATHETERIZATION  06/2011   "diagnostic"  . COLONOSCOPY    . CORONARY ANGIOPLASTY WITH STENT PLACEMENT  07/09/11   "1"  . HEMORRHOID SURGERY  2000  . [place]  left  . LEFT HEART CATHETERIZATION WITH  CORONARY ANGIOGRAM N/A 10/21/2011   Procedure: LEFT HEART CATHETERIZATION WITH CORONARY ANGIOGRAM;  Surgeon: THillary Bow MD;  [facility];  Service: Cardiovascular;  Laterality: N/A;  . LITHOTRIPSY  10/2008  . PERCUTANEOUS CORONARY STENT INTERVENTION (PCI-S) N/A 07/09/2011   Procedure: PERCUTANEOUS CORONARY STENT INTERVENTION (PCI-S);  Surgeon: MSherren Mocha MD;  [facility];  Service: Cardiovascular;  Laterality: N/A;  . UPPER GI ENDOSCOPY    . URETEROSCOPY  10/2009   Family History  Problem Relation Age of Onset  . Heart attack Father 436      LVAD  . Hypertension Other    Social History   Socioeconomic History  . Marital status: Single    Spouse name: Not on file  . Number of children: 4  . Years of education: Not on file  . Highest education level: Not on file  Occupational History  . Occupation: HConservation officer, [employer] Social Needs  . Financial resource strain: Not on file  . Food insecurity    Worry: Not on file    Inability: Not on file  . Transportation needs    Medical: Not on file    Non-medical: Not on file  Tobacco Use  .  Smoking status: Former Smoker    Packs/day: 0.50    Years: 20.00    Pack years: 10.00    Types: Cigarettes    Quit date: 02/17/1990    Years since quitting: 28.9  . Smokeless tobacco: Never Used  Substance and Sexual Activity  . Alcohol use: Yes    Alcohol/week: 4.0 standard drinks    Types: 2 Glasses of wine, 2 Cans of beer per week  . Drug use: Yes    Frequency: 7.0 times per week    Types: Marijuana    Comment: 07/09/11 "lets say a cigarette a day"  . Sexual activity: Yes  Lifestyle  . Physical activity    Days per week: Not on file    Minutes per session: Not on file  . Stress: Not on file  Relationships  . Social Herbalist on phone: Not on file    Gets together: Not on file    Attends religious service: Not on file    Active member of club or organization: Not on file    Attends meetings of  clubs or organizations: Not on file    Relationship status: Not on file  Other Topics Concern  . Not on file  Social History Narrative   Married and has 4 children.      Outpatient Encounter Medications as of 01/05/2019  Medication Sig  . amLODipine (NORVASC) 10 MG tablet Take 1 tablet (10 mg total) by mouth daily.  Marland Kitchen atorvastatin (LIPITOR) 20 MG tablet Take 1 tablet (20 mg total) by mouth daily.  . B-D 3CC LUER-LOK SYR 22GX1" 22G X 1" 3 ML MISC FOR TESTOSTERONE INJECTIONS  . cetirizine (ZYRTEC) 10 MG tablet Take 10 mg by mouth daily.  Marland Kitchen CINNAMON PO Take 1,000 mg by mouth 2 (two) times daily.   . clopidogrel (PLAVIX) 75 MG tablet TAKE ONE TABLET BY MOUTH EVERY DAY  . fluticasone (FLONASE) 50 MCG/ACT nasal spray USE 2 SPRAYS IN EACH NOSTRIL DAILY AS DIRECTED  . glucose blood test strip Use as instructed to check sugars once daily. Dx E11.9  . hydrochlorothiazide (HYDRODIURIL) 25 MG tablet Take 1 tablet (25 mg total) by mouth at bedtime.  . hydrOXYzine (ATARAX/VISTARIL) 50 MG tablet Take 50 mg by mouth 2 (two) times daily.  Marland Kitchen lamoTRIgine (LAMICTAL) 100 MG tablet Takes 2 tablets am and 1 tablet pm daily.  . Lancets (ONETOUCH ULTRASOFT) lancets Use as instructed to check sugars once daily. Dx E 11.9  . magnesium oxide (MAG-OX) 400 MG tablet Take 400 mg by mouth daily.  Marland Kitchen NEEDLE, DISP, 16 G 16G X 1" MISC FOR TESTOSTERONE INJECTIONS  . nitroGLYCERIN (NITROSTAT) 0.4 MG SL tablet Place 1 tablet (0.4 mg total) under the tongue every 5 (five) minutes as needed for chest pain.  Marland Kitchen olmesartan-hydrochlorothiazide (BENICAR HCT) 40-25 MG tablet Take 1 tablet by mouth daily.  . pantoprazole (PROTONIX) 40 MG tablet Take 1 tablet (40 mg total) by mouth daily.  . polyethylene glycol powder (GLYCOLAX/MIRALAX) powder TAKE 17 GRAMS BY MOUTH EVERY DAY as needed (as directed)  . potassium citrate (UROCIT-K) 10 MEQ (1080 MG) SR tablet Take 1 tablet (10 mEq total) by mouth 2 (two) times daily.  . Psyllium  (METAMUCIL PO) Take by mouth.  . Syringe, Disposable, (B-D SYRINGE LUER-LOK 3CC) 3 ML MISC 1 mL by Does not apply route every 14 (fourteen) days.  . tamsulosin (FLOMAX) 0.4 MG CAPS capsule Take 1 capsule (0.4 mg total) by mouth daily.  Marland Kitchen  testosterone cypionate (DEPOTESTOSTERONE CYPIONATE) 200 MG/ML injection Inject 0.5 mLs (100 mg total) into the muscle once a week.  . traZODone (DESYREL) 100 MG tablet   . venlafaxine XR (EFFEXOR-XR) 150 MG 24 hr capsule Take 150 mg by mouth daily with breakfast.  . VITAMIN D, CHOLECALCIFEROL, PO Take 2,000 mg by mouth daily.  Marland Kitchen VITAMIN E PO Take 2,000 Units by mouth daily.  . [DISCONTINUED] hydrOXYzine (VISTARIL) 50 MG capsule   . [DISCONTINUED] NEOMYCIN-POLYMYXIN-HYDROCORTISONE (CORTISPORIN) 1 % SOLN OTIC solution Apply 1-2 drops to toe BID after soaking   No facility-administered encounter medications on file as of 01/05/2019.    Review of Systems  Constitutional: Negative for appetite change and unexpected weight change.  HENT: Negative for congestion and sinus pressure.   Eyes: Negative for pain and visual disturbance.  Respiratory: Negative for cough, chest tightness and shortness of breath.   Cardiovascular: Negative for chest pain, palpitations and leg swelling.  Gastrointestinal: Negative for abdominal pain, diarrhea, nausea and vomiting.  Genitourinary: Negative for difficulty urinating and dysuria.  Musculoskeletal: Negative for joint swelling and myalgias.  Skin: Negative for color change and rash.  Neurological: Negative for dizziness, light-headedness and headaches.  Hematological: Negative for adenopathy. Does not bruise/bleed easily.  Psychiatric/Behavioral: Negative for agitation and dysphoric mood.       Objective:    Physical Exam Constitutional:      General: He is not in acute distress.    Appearance: Normal appearance. He is well-developed.  HENT:     Head: Normocephalic and atraumatic.     Right Ear: External ear normal.      Left Ear: External ear normal.  Eyes:     General: No scleral icterus.       Right eye: No discharge.        Left eye: No discharge.     Conjunctiva/sclera: Conjunctivae normal.  Neck:     Musculoskeletal: Neck supple. No muscular tenderness.     Thyroid: No thyromegaly.  Cardiovascular:     Rate and Rhythm: Normal rate and regular rhythm.  Pulmonary:     Effort: No respiratory distress.     Breath sounds: Normal breath sounds. No wheezing.  Abdominal:     General: Bowel sounds are normal.     Palpations: Abdomen is soft.     Tenderness: There is no abdominal tenderness.  Genitourinary:    Comments: Not performed.  Musculoskeletal:        General: No swelling or tenderness.  Lymphadenopathy:     Cervical: No cervical adenopathy.  Skin:    Findings: No erythema or rash.  Neurological:     Mental Status: He is alert and oriented to person, place, and time.  Psychiatric:        Mood and Affect: Mood normal.        Behavior: Behavior normal.     BP 134/70   Pulse 76   Temp 97.9 F (36.6 C)   Resp 16   Ht 6' (1.829 m)   Wt 219 lb (99.3 kg)   SpO2 97%   BMI 29.70 kg/m  Wt Readings from Last 3 Encounters:  01/05/19 219 lb (99.3 kg)  11/15/18 218 lb (98.9 kg)  08/30/18 220 lb (99.8 kg)     Lab Results  Component Value Date   WBC 7.4 06/21/2018   HGB 16.1 06/21/2018   HCT 46.8 06/21/2018   PLT 162.0 06/21/2018   GLUCOSE 157 (H) 01/03/2019   CHOL 144 01/03/2019   TRIG  224.0 (H) 01/03/2019   HDL 29.90 (L) 01/03/2019   LDLDIRECT 88.0 01/03/2019   LDLCALC 83 06/21/2018   ALT 24 01/03/2019   AST 23 01/03/2019   NA 140 01/03/2019   K 3.9 01/03/2019   CL 99 01/03/2019   CREATININE 1.11 01/03/2019   BUN 24 (H) 01/03/2019   CO2 31 01/03/2019   TSH 3.06 01/03/2019   INR 1.11 10/19/2011   HGBA1C 6.5 01/03/2019   MICROALBUR 0.9 06/21/2018     .       Assessment & Plan:   Problem List Items Addressed This Visit    Abnormal liver function    Low carb diet  and exercise.  Follow liver function tests.        Anxiety    Followed by Dr Nicolasa Ducking.  Appears to be doing well on current medication regimen.        CAD (coronary artery disease)    Followed by Dr Rockey Situ.  Currently asymptomatic.  Continue risk factor modification.       Diabetes mellitus with cardiac complication (HCC)    Low carb diet and exercise.  Follow met b and a1c.       Relevant Orders   Hemoglobin M0L   Basic metabolic panel (future)   DM Microalbumin / creatinine urine ratio   GERD (gastroesophageal reflux disease)    Controlled.       Health care maintenance    Physical today 01/05/19.  Followed by Dr Bernardo Heater for prostate.  Overdue colonoscopy.  Had referred to Dr Alain Marion. States they contacted him and postponed due to covid.  Planning to f/u after first of the year.        RESOLVED: Hyperglycemia    Low carb diet and exercise.  Follow met b and a1c.       Hyperlipidemia    On lipitor.  Low cholesterol diet and exercise.  Follow lipid panel and liver function tests.        Relevant Orders   Hepatic function panel   Lipid panel   Hypertension    Blood pressure under good control.  Continue same medication regimen.  Follow pressures.  Follow metabolic panel.        Relevant Orders   CBC with Differential/Platelet   Sleep apnea    CPAP.        Other Visit Diagnoses    Need for 23-polyvalent pneumococcal polysaccharide vaccine    -  Primary   Relevant Orders   Pneumococcal polysaccharide vaccine 23-valent greater than or equal to 2yo subcutaneous/IM (Completed)   Need for immunization against influenza       Relevant Orders   Flu Vaccine QUAD 36+ mos IM (Completed)       Einar Pheasant, MD

## 2019-01-09 ENCOUNTER — Encounter: Payer: Self-pay | Admitting: Internal Medicine

## 2019-01-09 NOTE — Assessment & Plan Note (Signed)
Followed by Dr Gollan.  Currently asymptomatic.  Continue risk factor modification.  

## 2019-01-09 NOTE — Assessment & Plan Note (Signed)
Physical today 01/05/19.  Followed by Dr Bernardo Heater for prostate.  Overdue colonoscopy.  Had referred to Dr Alain Marion. States they contacted him and postponed due to covid.  Planning to f/u after first of the year.

## 2019-01-09 NOTE — Assessment & Plan Note (Signed)
Blood pressure under good control.  Continue same medication regimen.  Follow pressures.  Follow metabolic panel.   

## 2019-01-09 NOTE — Assessment & Plan Note (Signed)
Controlled.  

## 2019-01-09 NOTE — Assessment & Plan Note (Signed)
Low carb diet and exercise.  Follow liver function tests.

## 2019-01-09 NOTE — Assessment & Plan Note (Signed)
On lipitor.  Low cholesterol diet and exercise.  Follow lipid panel and liver function tests.   

## 2019-01-09 NOTE — Assessment & Plan Note (Signed)
Low carb diet and exercise.  Follow met b and a1c.

## 2019-01-09 NOTE — Assessment & Plan Note (Signed)
Low carb diet and exercise.  Follow met b and a1c.  

## 2019-01-09 NOTE — Assessment & Plan Note (Signed)
Followed by Dr Nicolasa Ducking.  Appears to be doing well on current medication regimen.

## 2019-01-09 NOTE — Assessment & Plan Note (Signed)
CPAP.  

## 2019-01-14 ENCOUNTER — Other Ambulatory Visit: Payer: Self-pay | Admitting: Cardiovascular Disease

## 2019-03-03 ENCOUNTER — Other Ambulatory Visit: Payer: Self-pay

## 2019-03-03 DIAGNOSIS — E291 Testicular hypofunction: Secondary | ICD-10-CM

## 2019-03-04 ENCOUNTER — Other Ambulatory Visit: Payer: Self-pay

## 2019-03-04 ENCOUNTER — Other Ambulatory Visit: Payer: BLUE CROSS/BLUE SHIELD

## 2019-03-04 DIAGNOSIS — E291 Testicular hypofunction: Secondary | ICD-10-CM

## 2019-03-05 LAB — TESTOSTERONE: Testosterone: 236 ng/dL — ABNORMAL LOW (ref 264–916)

## 2019-03-06 ENCOUNTER — Encounter: Payer: Self-pay | Admitting: Urology

## 2019-03-06 ENCOUNTER — Other Ambulatory Visit: Payer: Self-pay | Admitting: Urology

## 2019-03-07 NOTE — Telephone Encounter (Signed)
Psa and hematocrit 03/08/2019

## 2019-03-08 ENCOUNTER — Encounter: Payer: Self-pay | Admitting: Urology

## 2019-03-08 ENCOUNTER — Other Ambulatory Visit: Payer: Self-pay

## 2019-03-10 ENCOUNTER — Encounter: Payer: Self-pay | Admitting: Urology

## 2019-03-25 ENCOUNTER — Other Ambulatory Visit: Payer: Self-pay | Admitting: Cardiovascular Disease

## 2019-04-01 ENCOUNTER — Telehealth: Payer: Self-pay

## 2019-04-01 MED ORDER — OLMESARTAN MEDOXOMIL-HCTZ 40-25 MG PO TABS: 1.0000 | ORAL_TABLET | Freq: Every day | ORAL | 3 refills | Status: DC

## 2019-04-01 NOTE — Telephone Encounter (Signed)
Refill sent for olmesartan-hctz 40/25 mg.

## 2019-04-08 ENCOUNTER — Encounter: Payer: Self-pay | Admitting: Urology

## 2019-04-11 ENCOUNTER — Other Ambulatory Visit: Payer: Self-pay | Admitting: *Deleted

## 2019-04-11 DIAGNOSIS — E291 Testicular hypofunction: Secondary | ICD-10-CM

## 2019-04-11 DIAGNOSIS — N401 Enlarged prostate with lower urinary tract symptoms: Secondary | ICD-10-CM

## 2019-04-12 ENCOUNTER — Other Ambulatory Visit: Payer: Self-pay

## 2019-04-12 ENCOUNTER — Other Ambulatory Visit: Payer: BLUE CROSS/BLUE SHIELD

## 2019-04-12 DIAGNOSIS — F3181 Bipolar II disorder: Secondary | ICD-10-CM | POA: Diagnosis not present

## 2019-04-12 DIAGNOSIS — E291 Testicular hypofunction: Secondary | ICD-10-CM

## 2019-04-12 DIAGNOSIS — N401 Enlarged prostate with lower urinary tract symptoms: Secondary | ICD-10-CM

## 2019-04-12 DIAGNOSIS — F1220 Cannabis dependence, uncomplicated: Secondary | ICD-10-CM | POA: Diagnosis not present

## 2019-04-12 DIAGNOSIS — G4700 Insomnia, unspecified: Secondary | ICD-10-CM | POA: Diagnosis not present

## 2019-04-13 LAB — HEMOGLOBIN AND HEMATOCRIT, BLOOD
Hematocrit: 47.6 % (ref 37.5–51.0)
Hemoglobin: 16.3 g/dL (ref 13.0–17.7)

## 2019-04-13 LAB — PSA: Prostate Specific Ag, Serum: 0.4 ng/mL (ref 0.0–4.0)

## 2019-04-15 ENCOUNTER — Telehealth: Payer: Self-pay | Admitting: *Deleted

## 2019-04-15 NOTE — Telephone Encounter (Signed)
-----   Message from Abbie Sons, MD sent at 04/15/2019  8:13 AM EST ----- PSA and hematocrit look good

## 2019-04-15 NOTE — Telephone Encounter (Signed)
Notified patient as instructed, patient pleased. Discussed follow-up appointments, patient agrees  

## 2019-05-10 DIAGNOSIS — F1220 Cannabis dependence, uncomplicated: Secondary | ICD-10-CM | POA: Diagnosis not present

## 2019-05-10 DIAGNOSIS — G4700 Insomnia, unspecified: Secondary | ICD-10-CM | POA: Diagnosis not present

## 2019-05-10 DIAGNOSIS — F3181 Bipolar II disorder: Secondary | ICD-10-CM | POA: Diagnosis not present

## 2019-05-17 ENCOUNTER — Other Ambulatory Visit: Payer: Self-pay | Admitting: Internal Medicine

## 2019-05-18 ENCOUNTER — Encounter: Payer: Self-pay | Admitting: Urology

## 2019-05-18 ENCOUNTER — Other Ambulatory Visit: Payer: Self-pay | Admitting: *Deleted

## 2019-05-18 NOTE — Telephone Encounter (Signed)
Refill request for Potassium, last seen 01-09-19, last filled 10-21-18.  Please advise. Last Potassium result - 3.9 on 01-03-19

## 2019-05-18 NOTE — Telephone Encounter (Signed)
Pending medication to doctor

## 2019-05-24 ENCOUNTER — Encounter: Payer: Self-pay | Admitting: Urology

## 2019-05-25 ENCOUNTER — Other Ambulatory Visit: Payer: Self-pay | Admitting: *Deleted

## 2019-05-25 MED ORDER — TESTOSTERONE CYPIONATE 200 MG/ML IM SOLN: INTRAMUSCULAR | 1 refills | Status: DC

## 2019-06-01 ENCOUNTER — Encounter: Payer: Self-pay | Admitting: Internal Medicine

## 2019-06-01 ENCOUNTER — Encounter: Payer: Self-pay | Admitting: Urology

## 2019-06-01 NOTE — Telephone Encounter (Signed)
Per review of chart ( medications and messages), his testosterone refill was sent to [facility] on 05/25/19 by Dr Dene Gentry office.  Please notify pt.

## 2019-06-01 NOTE — Telephone Encounter (Signed)
Do you refill testosterone?

## 2019-06-20 ENCOUNTER — Other Ambulatory Visit: Payer: Self-pay | Admitting: Cardiovascular Disease

## 2019-06-20 ENCOUNTER — Other Ambulatory Visit: Payer: Self-pay | Admitting: Internal Medicine

## 2019-06-30 ENCOUNTER — Other Ambulatory Visit: Payer: Self-pay

## 2019-06-30 ENCOUNTER — Other Ambulatory Visit (INDEPENDENT_AMBULATORY_CARE_PROVIDER_SITE_OTHER): Payer: BLUE CROSS/BLUE SHIELD

## 2019-06-30 DIAGNOSIS — E782 Mixed hyperlipidemia: Secondary | ICD-10-CM

## 2019-06-30 DIAGNOSIS — I10 Essential (primary) hypertension: Secondary | ICD-10-CM | POA: Diagnosis not present

## 2019-06-30 DIAGNOSIS — E1159 Type 2 diabetes mellitus with other circulatory complications: Secondary | ICD-10-CM

## 2019-06-30 LAB — MICROALBUMIN / CREATININE URINE RATIO
Creatinine,U: 132.9 mg/dL
Microalb Creat Ratio: 0.5 mg/g (ref 0.0–30.0)
Microalb, Ur: 0.7 mg/dL (ref 0.0–1.9)

## 2019-06-30 LAB — CBC WITH DIFFERENTIAL/PLATELET
Basophils Absolute: 0 10*3/uL (ref 0.0–0.1)
Basophils Relative: 0.7 % (ref 0.0–3.0)
Eosinophils Absolute: 0.1 10*3/uL (ref 0.0–0.7)
Eosinophils Relative: 1.4 % (ref 0.0–5.0)
HCT: 46.3 % (ref 39.0–52.0)
Hemoglobin: 15.7 g/dL (ref 13.0–17.0)
Lymphocytes Relative: 20.1 % (ref 12.0–46.0)
Lymphs Abs: 1.4 10*3/uL (ref 0.7–4.0)
MCHC: 34 g/dL (ref 30.0–36.0)
MCV: 90.6 fl (ref 78.0–100.0)
Monocytes Absolute: 0.6 10*3/uL (ref 0.1–1.0)
Monocytes Relative: 9 % (ref 3.0–12.0)
Neutro Abs: 4.8 10*3/uL (ref 1.4–7.7)
Neutrophils Relative %: 68.8 % (ref 43.0–77.0)
Platelets: 162 10*3/uL (ref 150.0–400.0)
RBC: 5.11 Mil/uL (ref 4.22–5.81)
RDW: 13 % (ref 11.5–15.5)
WBC: 7 10*3/uL (ref 4.0–10.5)

## 2019-06-30 LAB — HEMOGLOBIN A1C: Hgb A1c MFr Bld: 7.1 % — ABNORMAL HIGH (ref 4.6–6.5)

## 2019-06-30 LAB — HEPATIC FUNCTION PANEL
ALT: 26 U/L (ref 0–53)
AST: 23 U/L (ref 0–37)
Albumin: 4.4 g/dL (ref 3.5–5.2)
Alkaline Phosphatase: 52 U/L (ref 39–117)
Bilirubin, Direct: 0.1 mg/dL (ref 0.0–0.3)
Total Bilirubin: 0.3 mg/dL (ref 0.2–1.2)
Total Protein: 6.8 g/dL (ref 6.0–8.3)

## 2019-06-30 LAB — LDL CHOLESTEROL, DIRECT: Direct LDL: 73 mg/dL

## 2019-06-30 LAB — BASIC METABOLIC PANEL
BUN: 25 mg/dL — ABNORMAL HIGH (ref 6–23)
CO2: 28 mEq/L (ref 19–32)
Calcium: 9.4 mg/dL (ref 8.4–10.5)
Chloride: 102 mEq/L (ref 96–112)
Creatinine, Ser: 1.26 mg/dL (ref 0.40–1.50)
GFR: 57.82 mL/min — ABNORMAL LOW (ref >60.00)
Glucose, Bld: 178 mg/dL — ABNORMAL HIGH (ref 70–99)
Potassium: 3.7 mEq/L (ref 3.5–5.1)
Sodium: 136 mEq/L (ref 135–145)

## 2019-06-30 LAB — LIPID PANEL
Cholesterol: 138 mg/dL (ref 0–200)
HDL: 26 mg/dL — ABNORMAL LOW (ref >39.00)
Total CHOL/HDL Ratio: 5
Triglycerides: 454 mg/dL — ABNORMAL HIGH (ref 0.0–149.0)

## 2019-07-05 ENCOUNTER — Telehealth (INDEPENDENT_AMBULATORY_CARE_PROVIDER_SITE_OTHER): Payer: BLUE CROSS/BLUE SHIELD | Admitting: Internal Medicine

## 2019-07-05 DIAGNOSIS — R454 Irritability and anger: Secondary | ICD-10-CM

## 2019-07-05 DIAGNOSIS — D696 Thrombocytopenia, unspecified: Secondary | ICD-10-CM

## 2019-07-05 DIAGNOSIS — I25118 Atherosclerotic heart disease of native coronary artery with other forms of angina pectoris: Secondary | ICD-10-CM

## 2019-07-05 DIAGNOSIS — E782 Mixed hyperlipidemia: Secondary | ICD-10-CM

## 2019-07-05 DIAGNOSIS — G4730 Sleep apnea, unspecified: Secondary | ICD-10-CM | POA: Diagnosis not present

## 2019-07-05 DIAGNOSIS — I10 Essential (primary) hypertension: Secondary | ICD-10-CM | POA: Diagnosis not present

## 2019-07-05 DIAGNOSIS — K2100 Gastro-esophageal reflux disease with esophagitis, without bleeding: Secondary | ICD-10-CM

## 2019-07-05 DIAGNOSIS — E1159 Type 2 diabetes mellitus with other circulatory complications: Secondary | ICD-10-CM

## 2019-07-05 NOTE — Progress Notes (Signed)
Patient ID: Keith Akkerman., male   DOB: 01-Dec-1955, 64 y.o.   MRN: [id number]   Virtual Visit via video Note  This visit type was conducted due to national recommendations for restrictions regarding the COVID-19 pandemic (e.g. social distancing).  This format is felt to be most appropriate for this patient at this time.  All issues noted in this document were discussed and addressed.  No physical exam was performed (except for noted visual exam findings with Video Visits).   I connected with Keith Gross by a video enabled telemedicine application and verified that I am speaking with the correct person using two identifiers. Location patient: home Location provider: work  Persons participating in the virtual visit: patient, provider  The limitations, risks, security and privacy concerns of performing an evaluation and management service by video and the availability of in person appointments have been discussed.   It has also been discussed with the patient that there may be a patient responsible charge related to this service. The patient has expressed understanding and has agreed to proceed.   Reason for visit: scheduled follow up.    HPI: Scheduled follow up - f/u regarding hypertension, diabetes and hypercholesterolemia.  He reports he is doing relatively well.  Stays active.  No chest pain.  No sob reported.  No abdominal pain or bowel or bowel change reported.  Sees Dr Nicolasa Ducking for f/u mood disorder, anxiety and irritability.  Doing well on current medication regimen.  Discussed recent labs.  Increased triglycerides.  Sugar increased - a1c 7.1.  Since finding out about his labs, has already started adjusting his diet.  Decreased bread and cookies.  Discussed low carb diet and exercise.  Followed by Dr Bernardo Heater.  On testosterone.  Some fatigue.  Is sleeping better.  Medication adjusted by Dr Nicolasa Ducking.  Blood pressure doing well. Overall feels he is handling things relatively well.     ROS: See  pertinent positives and negatives per HPI.  Past Medical History:  Diagnosis Date  . Acid reflux   . Angina   . Anxiety   . Arthritis   . Diastasis of muscle 2011   "abdominal"  . Dysplastic polyp of colon 2008   [facility]  . GERD (gastroesophageal reflux disease)   . Gout    Multiple episodes  . Heart disease   . Hyperlipidemia   . Hypertension   . Kidney stones   . Myocardial infarction [facility]) 05/2011   "we think";  06/2011 abnl mv;  06/2011 Cath - subtotal occlusion of Ramus, otw nonobs dzs;  06/2011 PCI/DES of Ramus w/ 2.25x66m Resolute DES   . Sleep apnea    on CPAP  . Syncopal episodes   . Treadmill stress test negative for angina pectoris 2011    Past Surgical History:  Procedure Laterality Date  . CARDIAC CATHETERIZATION  06/2011   "diagnostic"  . COLONOSCOPY    . CORONARY ANGIOPLASTY WITH STENT PLACEMENT  07/09/11   "1"  . H[place]ORRHOID SURGERY  [place]  [place]  . LEFT HEART CATHETERIZATION WITH CORONARY ANGIOGRAM N/A 10/21/2011   Procedure: LEFT HEART CATHETERIZATION WITH CORONARY ANGIOGRAM;  Surgeon: THillary Bow MD;  [facility];  Service: Cardiovascular;  Laterality: N/A;  . LITHOTRIPSY  10/2008  . PERCUTANEOUS CORONARY STENT INTERVENTION (PCI-S) N/A 07/09/2011   Procedure: PERCUTANEOUS CORONARY STENT INTERVENTION (PCI-S);  Surgeon: MSherren Mocha MD;  [facility];  Service: Cardiovascular;  Laterality: N/A;  .  UPPER GI ENDOSCOPY    . URETEROSCOPY  10/2009    Family History  Problem Relation Age of Onset  . Heart attack Father 74       LVAD  . Hypertension Other     SOCIAL HX: reviewed.    Current Outpatient Medications:  .  amLODipine (NORVASC) 10 MG tablet, TAKE ONE TABLET BY MOUTH DAILY, Disp: 90 tablet, Rfl: 1 .  atorvastatin (LIPITOR) 20 MG tablet, TAKE ONE TABLET BY MOUTH EVERY DAY, Disp: 90 tablet, Rfl: 3 .  B-D 3CC LUER-LOK SYR 22GX1" 22G X 1" 3 ML MISC, FOR TESTOSTERONE INJECTIONS, Disp: 25 each, Rfl:  0 .  cetirizine (ZYRTEC) 10 MG tablet, Take 10 mg by mouth daily., Disp: , Rfl:  .  CINNAMON PO, Take 1,000 mg by mouth 2 (two) times daily. , Disp: , Rfl:  .  clopidogrel (PLAVIX) 75 MG tablet, TAKE ONE TABLET BY MOUTH DAILY, Disp: 90 tablet, Rfl: 0 .  fluticasone (FLONASE) 50 MCG/ACT nasal spray, USE 2 SPRAYS IN EACH NOSTRIL DAILY AS DIRECTED, Disp: 48 g, Rfl: 1 .  glucose blood test strip, Use as instructed to check sugars once daily. Dx E11.9, Disp: 100 each, Rfl: 12 .  hydrochlorothiazide (HYDRODIURIL) 25 MG tablet, Take 1 tablet (25 mg total) by mouth at bedtime., Disp: 90 tablet, Rfl: 2 .  hydrOXYzine (ATARAX/VISTARIL) 50 MG tablet, Take 50 mg by mouth 2 (two) times daily., Disp: , Rfl:  .  lamoTRIgine (LAMICTAL) 100 MG tablet, Takes 2 tablets am and 1 tablet pm daily., Disp: , Rfl: 0 .  Lancets (ONETOUCH ULTRASOFT) lancets, Use as instructed to check sugars once daily. Dx E 11.9, Disp: 100 each, Rfl: 12 .  magnesium oxide (MAG-OX) 400 MG tablet, Take 400 mg by mouth daily., Disp: , Rfl:  .  NEEDLE, DISP, 16 G 16G X 1" MISC, FOR TESTOSTERONE INJECTIONS, Disp: 25 each, Rfl: 3 .  nitroGLYCERIN (NITROSTAT) 0.4 MG SL tablet, Place 1 tablet (0.4 mg total) under the tongue every 5 (five) minutes as needed for chest pain., Disp: 25 tablet, Rfl: 2 .  olmesartan-hydrochlorothiazide (BENICAR HCT) 40-25 MG tablet, Take 1 tablet by mouth daily., Disp: 90 tablet, Rfl: 3 .  pantoprazole (PROTONIX) 40 MG tablet, TAKE ONE TABLET BY MOUTH DAILY, Disp: 90 tablet, Rfl: 0 .  polyethylene glycol powder (GLYCOLAX/MIRALAX) powder, TAKE 17 GRAMS BY MOUTH EVERY DAY as needed (as directed), Disp: 527 g, Rfl: 1 .  potassium citrate (UROCIT-K) 10 MEQ (1080 MG) SR tablet, TAKE ONE TABLET BY MOUTH TWICE A DAY, Disp: 180 tablet, Rfl: 0 .  Psyllium (METAMUCIL PO), Take by mouth., Disp: , Rfl:  .  Syringe, Disposable, (B-D SYRINGE LUER-LOK 3CC) 3 ML MISC, 1 mL by Does not apply route every 14 (fourteen) days., Disp: 25  each, Rfl: 0 .  tamsulosin (FLOMAX) 0.4 MG CAPS capsule, Take 1 capsule (0.4 mg total) by mouth daily., Disp: 90 capsule, Rfl: 1 .  testosterone cypionate (DEPOTESTOSTERONE CYPIONATE) 200 MG/ML injection, INJECT 0.5 MLS INTRAMUSCULARLY ONCE A WEEK, Disp: 6 mL, Rfl: 1 .  traZODone (DESYREL) 100 MG tablet, , Disp: , Rfl:  .  venlafaxine XR (EFFEXOR-XR) 150 MG 24 hr capsule, Take 150 mg by mouth daily with breakfast., Disp: , Rfl:  .  VITAMIN D, CHOLECALCIFEROL, PO, Take 2,000 mg by mouth daily., Disp: , Rfl:  .  VITAMIN E PO, Take 2,000 Units by mouth daily., Disp: , Rfl:   EXAM:  VITALS per patient if applicable: 295/28  GENERAL: alert, oriented, appears well and in no acute distress  HEENT: atraumatic, conjunttiva clear, no obvious abnormalities on inspection of external nose and ears  NECK: normal movements of the head and neck  LUNGS: on inspection no signs of respiratory distress, breathing rate appears normal, no obvious gross SOB, gasping or wheezing  CV: no obvious cyanosis  PSYCH/NEURO: pleasant and cooperative, no obvious depression or anxiety, speech and thought processing grossly intact  ASSESSMENT AND PLAN:  Discussed the following assessment and plan:  Thrombocytopenia Platelet count wnl 06/30/19.    Sleep apnea CPAP.   Irritability and anger Doing better.  Followed by Dr Nicolasa Ducking. Doing well on current medication regimen - effexor, lamictal, hydroxyzine and trazodone.  Sleeping better.    Hypertension Blood pressure as outlined.  Continue current medication regimen:  hctz and amlodipine.  Follow pressures.  Follow metabolic panel.   Hyperlipidemia On lipitor.  Low cholesterol diet and exercise.  Follow lipid panel and liver function tests.     GERD (gastroesophageal reflux disease) On protonix.  Controlled.    Diabetes mellitus with cardiac complication (HCC) Low carb diet and exercise.  Follow met b and a1c.    CAD (coronary artery disease) Followed by Dr  Rockey Situ. Currently asymptomatic.  Continue risk factor modification.     Orders Placed This Encounter  Procedures  . Hemoglobin A1c    Standing Status:   Future    Standing Expiration Date:   07/17/2020  . Hepatic function panel    Standing Status:   Future    Standing Expiration Date:   07/17/2020  . Lipid panel    Standing Status:   Future    Standing Expiration Date:   07/17/2020  . Basic metabolic panel    Standing Status:   Future    Standing Expiration Date:   07/17/2020    No orders of the defined types were placed in this encounter.    I discussed the assessment and treatment plan with the patient. The patient was provided an opportunity to ask questions and all were answered. The patient agreed with the plan and demonstrated an understanding of the instructions.   The patient was advised to call back or seek an in-person evaluation if the symptoms worsen or if the condition fails to improve as anticipated.    Einar Pheasant, MD

## 2019-07-09 ENCOUNTER — Ambulatory Visit: Payer: BLUE CROSS/BLUE SHIELD

## 2019-07-12 ENCOUNTER — Ambulatory Visit: Payer: BLUE CROSS/BLUE SHIELD | Attending: Internal Medicine

## 2019-07-12 DIAGNOSIS — Z23 Encounter for immunization: Secondary | ICD-10-CM

## 2019-07-12 NOTE — Progress Notes (Signed)
   [id number]  Name:  Tyr Pearce.    MRN: [id number] DOB: 1955/08/18  07/12/2019  Mr. Savidge was observed post Covid-19 immunization for 15 minutes without incident. He was provided with Vaccine Information Sheet and instruction to access the V-Safe system.   Mr. Matsunaga was instructed to call 911 with any severe reactions post vaccine: Marland Kitchen Difficulty breathing  . Swelling of face and throat  . A fast heartbeat  . A bad rash all over body  . Dizziness and weakness   Immunizations Administered    Name Date Dose VIS Date Route   Pfizer COVID-19 Vaccine 07/12/2019 10:35 AM 0.3 mL 04/13/2018 Intramuscular   Manufacturer: [employer]   Lot: [id number]   [place]: [id number]

## 2019-07-18 ENCOUNTER — Encounter: Payer: Self-pay | Admitting: Internal Medicine

## 2019-07-18 NOTE — Assessment & Plan Note (Signed)
Followed by Dr Gollan.  Currently asymptomatic.  Continue risk factor modification.  

## 2019-07-18 NOTE — Assessment & Plan Note (Signed)
Blood pressure as outlined.  Continue current medication regimen:  hctz and amlodipine.  Follow pressures.  Follow metabolic panel.

## 2019-07-18 NOTE — Assessment & Plan Note (Signed)
Low carb diet and exercise.  Follow met b and a1c.   

## 2019-07-18 NOTE — Assessment & Plan Note (Signed)
CPAP.  

## 2019-07-18 NOTE — Assessment & Plan Note (Signed)
Platelet count wnl 06/30/19.

## 2019-07-18 NOTE — Assessment & Plan Note (Addendum)
Doing better.  Followed by Dr Nicolasa Ducking. Doing well on current medication regimen - effexor, lamictal, hydroxyzine and trazodone.  Sleeping better.

## 2019-07-18 NOTE — Assessment & Plan Note (Signed)
On lipitor.  Low cholesterol diet and exercise.  Follow lipid panel and liver function tests.   

## 2019-07-18 NOTE — Assessment & Plan Note (Signed)
On protonix.  Controlled.   

## 2019-08-02 ENCOUNTER — Ambulatory Visit: Payer: BLUE CROSS/BLUE SHIELD | Attending: Internal Medicine

## 2019-08-02 DIAGNOSIS — Z23 Encounter for immunization: Secondary | ICD-10-CM

## 2019-08-02 NOTE — Progress Notes (Signed)
° °  [id number]ination [facility]  Name:  Keith Gross.    MRN: [id number] DOB: 05-20-1955  08/02/2019  [name] was observed post Covid-19 immunization for 15 minutes without incident. He was provided with Vaccine Information Sheet and instruction to access the V-Safe system.   [name] was instructed to call 911 with any severe reactions post vaccine:  Difficulty breathing   Swelling of face and throat   A fast heartbeat   A bad rash all over body   Dizziness and weakness   Immunizations Administered    Name Date Dose VIS Date Route   Pfizer COVID-19 Vaccine 08/02/2019  8:24 AM 0.3 mL 04/13/2018 Intramuscular   Manufacturer: [place]   Lot: [id number]   [place]: [id number]

## 2019-08-09 ENCOUNTER — Other Ambulatory Visit: Payer: Self-pay | Admitting: Internal Medicine

## 2019-08-11 DIAGNOSIS — F1220 Cannabis dependence, uncomplicated: Secondary | ICD-10-CM | POA: Diagnosis not present

## 2019-08-11 DIAGNOSIS — F3181 Bipolar II disorder: Secondary | ICD-10-CM | POA: Diagnosis not present

## 2019-08-11 DIAGNOSIS — G4700 Insomnia, unspecified: Secondary | ICD-10-CM | POA: Diagnosis not present

## 2019-08-23 ENCOUNTER — Encounter: Payer: Self-pay | Admitting: Internal Medicine

## 2019-08-24 MED ORDER — METFORMIN HCL ER 500 MG PO TB24: 500.0000 mg | ORAL_TABLET | Freq: Every day | ORAL | 1 refills | Status: DC

## 2019-08-24 NOTE — Telephone Encounter (Signed)
rx sent in for metformin XR 500mg q day #30 with one refill.  Will need a non fasting lab scheduled in the next 3-4 weeks.  (will recheck met b).  I have sent pt a my chart message notifying him will need lab, just need to schedule and notify pt of appt date and time.   °

## 2019-09-01 ENCOUNTER — Ambulatory Visit: Payer: BLUE CROSS/BLUE SHIELD | Admitting: Urology

## 2019-09-07 NOTE — Progress Notes (Signed)
09/08/2019 9:47 AM   Keith Gross Jul 19, 1955 [id number]  Referring provider: Einar Gross, [address] [place],  [place] [address] Chief Complaint  Patient presents with   Hypogonadism   Urologic history: 1.  Hypogonadism             -On TRT 100 mg testosterone cypionate weekly  2.  BPH with lower urinary tract symptoms             -Tamsulosin 0.4 mg daily  3.  History of stone disease             -Prior shockwave lithotripsy/ureteroscopy  4.  Erectile dysfunction             -Prior penile implant Keith Gross at [facility]  HPI: Keith Gross. is a 64 y.o. male with hypogonadism is seen today for an annual follow up.  -Good energy level, libido -Testosterone was 236 on 03/04/2019 -No bothersome LUTS -Recent PSA was stable at 0.4 on 04/12/2019.  -Patient has no complaints.   PSA trend: 01/18/2018: 0.3 08/30/2018: 0.4 04/12/2019: 0.4   PMH: Past Medical History:  Diagnosis Date   Acid reflux    Angina    Anxiety    Arthritis    Diastasis of muscle 2011   "abdominal"   Dysplastic polyp of colon 2008   [facility]   GERD (gastroesophageal reflux disease)    Gout    Multiple episodes   Heart disease    Hyperlipidemia    Hypertension    Kidney stones    Myocardial infarction ([place]) 05/2011   "we think";  06/2011 abnl mv;  06/2011 Cath - subtotal occlusion of Ramus, otw nonobs dzs;  06/2011 PCI/DES of Ramus w/ 2.25x50mm Resolute DES    Sleep apnea    on CPAP   Syncopal episodes    Treadmill stress test negative for angina pectoris 2011    Surgical History: Past Surgical History:  Procedure Laterality Date   CARDIAC CATHETERIZATION  06/2011   "diagnostic"   COLONOSCOPY     CORONARY ANGIOPLASTY WITH STENT PLACEMENT  07/09/11   "1"   [place]   left   LEFT HEART CATHETERIZATION WITH CORONARY ANGIOGRAM N/A 10/21/2011   Procedure: LEFT HEART CATHETERIZATION WITH  CORONARY ANGIOGRAM;  Surgeon: Hillary Bow, MD;  [facility];  Service: Cardiovascular;  Laterality: N/A;   LITHOTRIPSY  10/2008   PERCUTANEOUS CORONARY STENT INTERVENTION (PCI-S) N/A 07/09/2011   Procedure: PERCUTANEOUS CORONARY STENT INTERVENTION (PCI-S);  Surgeon: Sherren Mocha, MD;  Location[facility] C[facility] LAB;  Service: Cardiovascular;  Laterality: N/A;   UPPER GI ENDOSCOPY     URETEROSCOPY  10/2009    Home Medications:  Allergies as of 09/08/2019   No Known Allergies     Medication List       Accurate as of September 08, 2019  9:47 AM. If you have any questions, ask your nurse or doctor.        amLODipine 10 MG tablet Commonly known as: NORVASC TAKE ONE TABLET BY MOUTH DAILY   atorvastatin 20 MG tablet Commonly known as: LIPITOR TAKE ONE TABLET BY MOUTH EVERY DAY   B-D 3CC LUER-LOK SYR 22GX1" 22G X 1" 3 ML Misc Generic drug: SYRINGE-NEEDLE (DISP) 3 ML FOR TESTOSTERONE INJECTIONS   cetirizine 10 MG tablet Commonly known as: ZYRTEC Take 10 mg by mouth daily.   CINNAMON PO Take 1,000 mg by mouth 2 (  two) times daily.   clopidogrel 75 MG tablet Commonly known as: PLAVIX TAKE ONE TABLET BY MOUTH DAILY   fluticasone 50 MCG/ACT nasal spray Commonly known as: FLONASE USE 2 SPRAYS IN EACH NOSTRIL DAILY AS DIRECTED   glucose blood test strip Use as instructed to check sugars once daily. Dx E11.9   hydrochlorothiazide 25 MG tablet Commonly known as: HYDRODIURIL Take 1 tablet (25 mg total) by mouth at bedtime.   hydrOXYzine 50 MG tablet Commonly known as: ATARAX/VISTARIL Take 50 mg by mouth 2 (two) times daily.   lamoTRIgine 100 MG tablet Commonly known as: LAMICTAL Takes 2 tablets am and 1 tablet pm daily.   magnesium oxide 400 MG tablet Commonly known as: MAG-OX Take 400 mg by mouth daily.   METAMUCIL PO Take by mouth.   metFORMIN 500 MG 24 hr tablet Commonly known as: Glucophage XR Take 1 tablet (500 mg total) by mouth daily with breakfast.     NEEDLE (DISP) 16 G 16G X 1" Misc FOR TESTOSTERONE INJECTIONS   nitroGLYCERIN 0.4 MG SL tablet Commonly known as: Nitrostat Place 1 tablet (0.4 mg total) under the tongue every 5 (five) minutes as needed for chest pain.   olmesartan-hydrochlorothiazide 40-25 MG tablet Commonly known as: BENICAR HCT Take 1 tablet by mouth daily.   onetouch ultrasoft lancets Use as instructed to check sugars once daily. Dx E 11.9   pantoprazole 40 MG tablet Commonly known as: PROTONIX TAKE ONE TABLET BY MOUTH DAILY   polyethylene glycol powder 17 GM/SCOOP powder Commonly known as: GLYCOLAX/MIRALAX TAKE 17 GRAMS BY MOUTH EVERY DAY as needed (as directed)   potassium citrate 10 MEQ (1080 MG) SR tablet Commonly known as: UROCIT-K TAKE ONE TABLET BY MOUTH TWICE A DAY   Syringe (Disposable) 3 ML Misc Commonly known as: B-D SYRINGE LUER-LOK 3CC 1 mL by Does not apply route every 14 (fourteen) days.   tamsulosin 0.4 MG Caps capsule Commonly known as: FLOMAX Take 1 capsule (0.4 mg total) by mouth daily.   testosterone cypionate 200 MG/ML injection Commonly known as: DEPOTESTOSTERONE CYPIONATE INJECT 0.5 MLS INTRAMUSCULARLY ONCE A WEEK   traZODone 100 MG tablet Commonly known as: DESYREL   venlafaxine XR 150 MG 24 hr capsule Commonly known as: EFFEXOR-XR Take 150 mg by mouth daily with breakfast.   VITAMIN D (CHOLECALCIFEROL) PO Take 2,000 mg by mouth daily.   VITAMIN E PO Take 2,000 Units by mouth daily.       Allergies: No Known Allergies  Family History: Family History  Problem Relation Age of Onset   Heart attack Father 97       LVAD   Hypertension Other     Social History:  reports that he quit smoking about 29 years ago. His smoking use included cigarettes. He has a 10.00 pack-year smoking history. He has never used smokeless tobacco. He reports current alcohol use of about 4.0 standard drinks of alcohol per week. He reports current drug use. Frequency: 7.00 times per  week. Drug: Marijuana.   Physical Exam: BP (!) 143/77    Pulse 99    Ht 6' (1.829 m)    Wt 220 lb (99.8 kg)    BMI 29.84 kg/m   Constitutional:  Alert and oriented, No acute distress. HEENT: [place] AT, moist mucus membranes.  Trachea midline, no masses. Cardiovascular: No clubbing, cyanosis, or edema. Respiratory: Normal respiratory effort, no increased work of breathing. GI: Abdomen is soft, nontender, nondistended, no abdominal masses GU: No CVA tenderness Rectal: Normal  sphincter tone, 30 g prostate, smooth nodules, non-tender. Skin: No rashes, bruises or suspicious lesions. Neurologic: Grossly intact, no focal deficits, moving all 4 extremities. Psychiatric: Normal mood and affect.   Assessment & Plan:    1. Hypogonadism -Stable on TRT -Testosterone drawn today, recent hematocrit 5/21 was normal -Lab visit with testosterone, PSA 6 months -1 year office visit  2. BPH w/ LUTS -Stable LUTS on tamsulosin prn  3.  History nephrolithiasis -Currently asymptomatic   [facility] [address] [place], [place] [address] [phone]  I, Selena Batten, am acting as a scribe for Dr. Nicki Reaper C. Damiya Sandefur,  I have reviewed the above documentation for accuracy and completeness, and I agree with the above.   Abbie Sons, MD

## 2019-09-08 ENCOUNTER — Other Ambulatory Visit: Payer: Self-pay

## 2019-09-08 ENCOUNTER — Encounter: Payer: Self-pay | Admitting: Urology

## 2019-09-08 ENCOUNTER — Ambulatory Visit: Payer: BLUE CROSS/BLUE SHIELD | Admitting: Urology

## 2019-09-08 ENCOUNTER — Ambulatory Visit (INDEPENDENT_AMBULATORY_CARE_PROVIDER_SITE_OTHER): Payer: BLUE CROSS/BLUE SHIELD | Admitting: Urology

## 2019-09-08 VITALS — BP 143/77 | HR 99 | Ht 72.0 in | Wt 220.0 lb

## 2019-09-08 DIAGNOSIS — E291 Testicular hypofunction: Secondary | ICD-10-CM

## 2019-09-09 ENCOUNTER — Encounter: Payer: Self-pay | Admitting: Urology

## 2019-09-09 LAB — TESTOSTERONE: Testosterone: 356 ng/dL (ref 264–916)

## 2019-09-11 ENCOUNTER — Encounter: Payer: Self-pay | Admitting: Urology

## 2019-09-12 ENCOUNTER — Telehealth: Payer: Self-pay | Admitting: *Deleted

## 2019-09-12 NOTE — Telephone Encounter (Signed)
-----   Message from Abbie Sons, MD sent at 09/09/2019  8:35 PM EDT ----- Regarding: Estradiol level Will you see if [facility] can run a estradiol level on the blood patient had drawn this week.  If not have him come in for a lab visit.  Thanks

## 2019-09-12 NOTE — Telephone Encounter (Signed)
Called and added estradiol level

## 2019-09-13 ENCOUNTER — Telehealth: Payer: Self-pay | Admitting: Urology

## 2019-09-13 LAB — SPECIMEN STATUS REPORT

## 2019-09-13 LAB — ESTRADIOL: Estradiol: 13.1 pg/mL (ref 7.6–42.6)

## 2019-09-13 MED ORDER — TESTOSTERONE CYPIONATE 200 MG/ML IM SOLN: 140.0000 mg | INTRAMUSCULAR | 1 refills | Status: DC

## 2019-09-13 NOTE — Telephone Encounter (Signed)
Notified patient as instructed, patient pleased. Discussed follow-up appointments, patient agrees  

## 2019-09-13 NOTE — Telephone Encounter (Signed)
Estradiol level was low at 13.1.  Can increase testosterone to 0.7 cc (140 mg) weekly.  Recheck lab visit/testosterone level 6 weeks

## 2019-09-15 ENCOUNTER — Other Ambulatory Visit: Payer: Self-pay

## 2019-09-15 ENCOUNTER — Other Ambulatory Visit (INDEPENDENT_AMBULATORY_CARE_PROVIDER_SITE_OTHER): Payer: BLUE CROSS/BLUE SHIELD

## 2019-09-15 DIAGNOSIS — E1159 Type 2 diabetes mellitus with other circulatory complications: Secondary | ICD-10-CM | POA: Diagnosis not present

## 2019-09-15 DIAGNOSIS — I10 Essential (primary) hypertension: Secondary | ICD-10-CM | POA: Diagnosis not present

## 2019-09-15 DIAGNOSIS — E782 Mixed hyperlipidemia: Secondary | ICD-10-CM | POA: Diagnosis not present

## 2019-09-15 LAB — LIPID PANEL
Cholesterol: 134 mg/dL (ref 0–200)
HDL: 32.4 mg/dL — ABNORMAL LOW (ref >39.00)
LDL Cholesterol: 64 mg/dL (ref 0–99)
NonHDL: 101.97
Total CHOL/HDL Ratio: 4
Triglycerides: 191 mg/dL — ABNORMAL HIGH (ref 0.0–149.0)
VLDL: 38.2 mg/dL (ref 0.0–40.0)

## 2019-09-15 LAB — BASIC METABOLIC PANEL
BUN: 19 mg/dL (ref 6–23)
CO2: 30 mEq/L (ref 19–32)
Calcium: 9.4 mg/dL (ref 8.4–10.5)
Chloride: 103 mEq/L (ref 96–112)
Creatinine, Ser: 1.1 mg/dL (ref 0.40–1.50)
GFR: 67.59 mL/min (ref >60.00)
Glucose, Bld: 155 mg/dL — ABNORMAL HIGH (ref 70–99)
Potassium: 4 mEq/L (ref 3.5–5.1)
Sodium: 138 mEq/L (ref 135–145)

## 2019-09-15 LAB — HEPATIC FUNCTION PANEL
ALT: 20 U/L (ref 0–53)
AST: 18 U/L (ref 0–37)
Albumin: 4.5 g/dL (ref 3.5–5.2)
Alkaline Phosphatase: 43 U/L (ref 39–117)
Bilirubin, Direct: 0.1 mg/dL (ref 0.0–0.3)
Total Bilirubin: 0.4 mg/dL (ref 0.2–1.2)
Total Protein: 6.8 g/dL (ref 6.0–8.3)

## 2019-09-15 LAB — HEMOGLOBIN A1C: Hgb A1c MFr Bld: 6.3 % (ref 4.6–6.5)

## 2019-09-18 ENCOUNTER — Other Ambulatory Visit: Payer: Self-pay | Admitting: Internal Medicine

## 2019-09-18 ENCOUNTER — Other Ambulatory Visit: Payer: Self-pay | Admitting: Cardiovascular Disease

## 2019-10-01 ENCOUNTER — Other Ambulatory Visit: Payer: Self-pay | Admitting: Internal Medicine

## 2019-10-10 ENCOUNTER — Other Ambulatory Visit: Payer: Self-pay | Admitting: Urology

## 2019-10-10 DIAGNOSIS — E291 Testicular hypofunction: Secondary | ICD-10-CM

## 2019-10-13 ENCOUNTER — Telehealth: Payer: Self-pay | Admitting: *Deleted

## 2019-10-13 NOTE — Telephone Encounter (Signed)
Please place future orders for lab appt.  

## 2019-10-14 NOTE — Telephone Encounter (Signed)
Please call pt and inform him that he just had labs drawn end of July.  Does not need to come in for lab appt on Monday.

## 2019-10-14 NOTE — Telephone Encounter (Signed)
Mychart sent.

## 2019-10-17 ENCOUNTER — Other Ambulatory Visit: Payer: BLUE CROSS/BLUE SHIELD

## 2019-10-17 NOTE — Telephone Encounter (Signed)
Message has not been read & he is still on the lab schedule for 8/31.

## 2019-10-18 ENCOUNTER — Other Ambulatory Visit: Payer: BLUE CROSS/BLUE SHIELD

## 2019-10-18 ENCOUNTER — Other Ambulatory Visit: Payer: Self-pay

## 2019-10-19 ENCOUNTER — Encounter: Payer: Self-pay | Admitting: Internal Medicine

## 2019-10-19 ENCOUNTER — Telehealth (INDEPENDENT_AMBULATORY_CARE_PROVIDER_SITE_OTHER): Payer: BLUE CROSS/BLUE SHIELD | Admitting: Internal Medicine

## 2019-10-19 DIAGNOSIS — K2100 Gastro-esophageal reflux disease with esophagitis, without bleeding: Secondary | ICD-10-CM

## 2019-10-19 DIAGNOSIS — G4730 Sleep apnea, unspecified: Secondary | ICD-10-CM

## 2019-10-19 DIAGNOSIS — E782 Mixed hyperlipidemia: Secondary | ICD-10-CM | POA: Diagnosis not present

## 2019-10-19 DIAGNOSIS — F419 Anxiety disorder, unspecified: Secondary | ICD-10-CM

## 2019-10-19 DIAGNOSIS — I10 Essential (primary) hypertension: Secondary | ICD-10-CM

## 2019-10-19 DIAGNOSIS — I25118 Atherosclerotic heart disease of native coronary artery with other forms of angina pectoris: Secondary | ICD-10-CM

## 2019-10-19 DIAGNOSIS — Z86010 Personal history of colonic polyps: Secondary | ICD-10-CM

## 2019-10-19 DIAGNOSIS — E1159 Type 2 diabetes mellitus with other circulatory complications: Secondary | ICD-10-CM

## 2019-10-19 NOTE — Progress Notes (Signed)
Patient ID: Keith Gross., male   DOB: [birth year], 64 y.o.   MRN: [id number]   Virtual Visit via video Note  This visit type was conducted due to national recommendations for restrictions regarding the COVID-19 pandemic (e.g. social distancing).  This format is felt to be most appropriate for this patient at this time.  All issues noted in this document were discussed and addressed.  No physical exam was performed (except for noted visual exam findings with Video Visits).   I connected with Keith Gross by a video enabled telemedicine application and verified that I am speaking with the correct person using two identifiers. Location patient: home Location provider: work  Persons participating in the virtual visit: patient, provider  The limitations, risks, security and privacy concerns of performing an evaluation and management service by video and the availability of in person appointments have been discussed.  It has also been discussed with the patient that there may be a patient responsible charge related to this service. The patient expressed understanding and agreed to proceed.   Reason for visit: scheduled follow up.   HPI: He reports he is doing well.  Following up on his blood sugar and blood pressure.  Has adjusted his diet.  Not exercising as much now.  Plans to start.  No chest pain or sob reported.  No abdominal pain or bowel change reported.  Handling stress.  Followed by Dr Maryruth Bun.  States blood pressure normally averaging 135/75.  Fasting sugars averaging 120-150.  Discussed covid booster.    ROS: See pertinent positives and negatives per HPI.  Past Medical History:  Diagnosis Date  . Acid reflux   . Angina   . Anxiety   . Arthritis   . Diastasis of muscle 2011   "abdominal"  . Dysplastic polyp of colon 2008   [facility]  . GERD (gastroesophageal reflux disease)   . Gout    Multiple episodes  . Heart disease   . Hyperlipidemia   . Hypertension   . Kidney stones   .  Myocardial infarction [facility]) 05/2011   "we think";  06/2011 abnl mv;  06/2011 Cath - subtotal occlusion of Ramus, otw nonobs dzs;  06/2011 PCI/DES of Ramus w/ 2.25x12mm Resolute DES   . Sleep apnea    on CPAP  . Syncopal episodes   . Treadmill stress test negative for angina pectoris 2011    Past Surgical History:  Procedure Laterality Date  . CARDIAC CATHETERIZATION  06/2011   "diagnostic"  . COLONOSCOPY    . CORONARY ANGIOPLASTY WITH STENT PLACEMENT  07/09/11   "1"  . HEMORRHOID SURGERY  2000  . INGUINAL HERNIA REPAIR  1987   left  . LEFT HEART CATHETERIZATION WITH CORONARY ANGIOGRAM N/A 10/21/2011   Procedure: LEFT HEART CATHETERIZATION WITH CORONARY ANGIOGRAM;  Surgeon: Herby Abraham, MD;  Location[facility] C[facility] LAB;  Service: Cardiovascular;  Laterality: N/A;  . LITHOTRIPSY  10/2008  . PERCUTANEOUS CORONARY STENT INTERVENTION (PCI-S) N/A 07/09/2011   Procedure: PERCUTANEOUS CORONARY STENT INTERVENTION (PCI-S);  Surgeon: Tonny Bollman, MD;  Location: [facility] C[facility] LAB;  Service: Cardiovascular;  Laterality: N/A;  . UPPER GI ENDOSCOPY    . URETEROSCOPY  10/2009    Family History  Problem Relation Age of Onset  . Heart attack Father 36       LVAD  . Hypertension Other     SOCIAL HX: reviewed.    Current Outpatient Medications:  .  amLODipine (NORVASC) 10 MG tablet, TAKE ONE TABLET BY  MOUTH DAILY, Disp: 90 tablet, Rfl: 0 .  atorvastatin (LIPITOR) 20 MG tablet, TAKE ONE TABLET BY MOUTH EVERY DAY, Disp: 90 tablet, Rfl: 3 .  B-D 3CC LUER-LOK SYR 22GX1" 22G X 1" 3 ML MISC, FOR TESTOSTERONE INJECTIONS, Disp: 25 each, Rfl: 0 .  BD HYPODERMIC NEEDLE 16G X 1" MISC, FOR USE WITH TESTOSTERONE INJECTIONS, Disp: 25 each, Rfl: 3 .  cetirizine (ZYRTEC) 10 MG tablet, Take 10 mg by mouth daily., Disp: , Rfl:  .  CINNAMON PO, Take 1,000 mg by mouth 2 (two) times daily. , Disp: , Rfl:  .  clopidogrel (PLAVIX) 75 MG tablet, TAKE ONE TABLET BY MOUTH DAILY, Disp: 90 tablet, Rfl: 0 .  fluticasone (FLONASE) 50  MCG/ACT nasal spray, USE 2 SPRAYS IN EACH NOSTRIL DAILY AS DIRECTED, Disp: 48 g, Rfl: 1 .  glucose blood test strip, Use as instructed to check sugars once daily. Dx E11.9, Disp: 100 each, Rfl: 12 .  hydrochlorothiazide (HYDRODIURIL) 25 MG tablet, TAKE ONE TABLET BY MOUTH EVERY NIGHT AT BEDTIME, Disp: 90 tablet, Rfl: 0 .  hydrOXYzine (ATARAX/VISTARIL) 50 MG tablet, Take 50 mg by mouth 2 (two) times daily., Disp: , Rfl:  .  lamoTRIgine (LAMICTAL) 100 MG tablet, Takes 2 tablets am and 1 tablet pm daily., Disp: , Rfl: 0 .  Lancets (ONETOUCH ULTRASOFT) lancets, Use as instructed to check sugars once daily. Dx E 11.9, Disp: 100 each, Rfl: 12 .  magnesium oxide (MAG-OX) 400 MG tablet, Take 400 mg by mouth daily., Disp: , Rfl:  .  metFORMIN (GLUCOPHAGE XR) 500 MG 24 hr tablet, Take 1 tablet (500 mg total) by mouth daily with breakfast., Disp: 30 tablet, Rfl: 1 .  nitroGLYCERIN (NITROSTAT) 0.4 MG SL tablet, Place 1 tablet (0.4 mg total) under the tongue every 5 (five) minutes as needed for chest pain., Disp: 25 tablet, Rfl: 2 .  olmesartan-hydrochlorothiazide (BENICAR HCT) 40-25 MG tablet, Take 1 tablet by mouth daily., Disp: 90 tablet, Rfl: 3 .  pantoprazole (PROTONIX) 40 MG tablet, TAKE ONE TABLET BY MOUTH DAILY, Disp: 90 tablet, Rfl: 0 .  polyethylene glycol powder (GLYCOLAX/MIRALAX) powder, TAKE 17 GRAMS BY MOUTH EVERY DAY as needed (as directed), Disp: 527 g, Rfl: 1 .  potassium citrate (UROCIT-K) 10 MEQ (1080 MG) SR tablet, TAKE ONE TABLET BY MOUTH TWICE A DAY, Disp: 180 tablet, Rfl: 0 .  Psyllium (METAMUCIL PO), Take by mouth., Disp: , Rfl:  .  Syringe, Disposable, (B-D SYRINGE LUER-LOK 3CC) 3 ML MISC, 1 mL by Does not apply route every 14 (fourteen) days., Disp: 25 each, Rfl: 0 .  tamsulosin (FLOMAX) 0.4 MG CAPS capsule, TAKE ONE CAPSULE BY MOUTH DAILY, Disp: 90 capsule, Rfl: 1 .  testosterone cypionate (DEPOTESTOSTERONE CYPIONATE) 200 MG/ML injection, Inject 0.7 mLs (140 mg total) into the muscle  once a week. INJECT 0.5 MLS INTRAMUSCULARLY ONCE A WEEK, Disp: 6 mL, Rfl: 1 .  traZODone (DESYREL) 100 MG tablet, , Disp: , Rfl:  .  venlafaxine XR (EFFEXOR-XR) 150 MG 24 hr capsule, Take 150 mg by mouth daily with breakfast., Disp: , Rfl:  .  VITAMIN D, CHOLECALCIFEROL, PO, Take 2,000 mg by mouth daily., Disp: , Rfl:  .  VITAMIN E PO, Take 2,000 Units by mouth daily., Disp: , Rfl:   EXAM:  GENERAL: alert, oriented, appears well and in no acute distress  HEENT: atraumatic, conjunttiva clear, no obvious abnormalities on inspection of external nose and ears  NECK: normal movements of the head and neck  LUNGS: on inspection no signs of respiratory distress, breathing rate appears normal, no obvious gross SOB, gasping or wheezing  CV: no obvious cyanosis  PSYCH/NEURO: pleasant and cooperative, no obvious depression or anxiety, speech and thought processing grossly intact  ASSESSMENT AND PLAN:  Discussed the following assessment and plan:  Sleep apnea CPAP.   Hypertension Blood pressures averaging 135/75.  Continue hctz and amlodipine.  Follow pressures.  Follow metabolic panel.   Hyperlipidemia On lipitor.  Low cholesterol diet and exercise.  Follow lipid panel and liver function tests.    History of colonic polyps Last colonoscopy 08/2013.  Due this year for f/u colonoscopy.  Was referred to GI.  Need to f/u regarding f/u colonoscopy.   GERD (gastroesophageal reflux disease) Upper symptoms controlled.  Protonix.   Diabetes mellitus with cardiac complication ([place]) Has adjusted diet.  Not exercising as much. Plans to start.  Continue metformin.  Follow met b and a1c.   CAD (coronary artery disease) Followed by Dr Rockey Situ.  Currently asymptomatic.  Continue risk factor modification.   Anxiety Followed by Dr Nicolasa Ducking.  Appears to be stable.    Orders Placed This Encounter  Procedures  . CBC with Differential/Platelet    Standing Status:   Future    Standing Expiration Date:    10/23/2020  . Hemoglobin A1c    Standing Status:   Future    Standing Expiration Date:   10/23/2020  . Hepatic function panel    Standing Status:   Future    Standing Expiration Date:   10/23/2020  . Lipid panel    Standing Status:   Future    Standing Expiration Date:   10/23/2020  . TSH    Standing Status:   Future    Standing Expiration Date:   10/23/2020  . Basic metabolic panel    Standing Status:   Future    Standing Expiration Date:   10/23/2020     I discussed the assessment and treatment plan with the patient. The patient was provided an opportunity to ask questions and all were answered. The patient agreed with the plan and demonstrated an understanding of the instructions.   The patient was advised to call back or seek an in-person evaluation if the symptoms worsen or if the condition fails to improve as anticipated.   Einar Pheasant, MD

## 2019-10-24 ENCOUNTER — Encounter: Payer: Self-pay | Admitting: Internal Medicine

## 2019-10-24 NOTE — Assessment & Plan Note (Signed)
CPAP.  

## 2019-10-24 NOTE — Assessment & Plan Note (Signed)
Last colonoscopy 08/2013.  Due this year for f/u colonoscopy.  Was referred to GI.  Need to f/u regarding f/u colonoscopy.

## 2019-10-24 NOTE — Assessment & Plan Note (Signed)
Upper symptoms controlled.  Protonix.

## 2019-10-24 NOTE — Assessment & Plan Note (Signed)
Followed by Dr Rockey Situ.  Currently asymptomatic.  Continue risk factor modification.

## 2019-10-24 NOTE — Assessment & Plan Note (Signed)
Followed by Dr Nicolasa Ducking.  Appears to be stable.

## 2019-10-24 NOTE — Assessment & Plan Note (Signed)
Has adjusted diet.  Not exercising as much. Plans to start.  Continue metformin.  Follow met b and a1c.  

## 2019-10-24 NOTE — Assessment & Plan Note (Signed)
On lipitor.  Low cholesterol diet and exercise.  Follow lipid panel and liver function tests.   

## 2019-10-24 NOTE — Assessment & Plan Note (Signed)
Blood pressures averaging 135/75.  Continue hctz and amlodipine.  Follow pressures.  Follow metabolic panel.

## 2019-10-26 ENCOUNTER — Other Ambulatory Visit: Payer: Self-pay | Admitting: Family Medicine

## 2019-10-26 DIAGNOSIS — E291 Testicular hypofunction: Secondary | ICD-10-CM

## 2019-10-27 ENCOUNTER — Other Ambulatory Visit: Payer: Self-pay

## 2019-10-27 ENCOUNTER — Other Ambulatory Visit: Payer: BLUE CROSS/BLUE SHIELD

## 2019-10-27 DIAGNOSIS — E291 Testicular hypofunction: Secondary | ICD-10-CM | POA: Diagnosis not present

## 2019-10-28 ENCOUNTER — Encounter: Payer: Self-pay | Admitting: Internal Medicine

## 2019-10-28 ENCOUNTER — Telehealth: Payer: Self-pay | Admitting: *Deleted

## 2019-10-28 LAB — TESTOSTERONE: Testosterone: 676 ng/dL (ref 264–916)

## 2019-10-28 MED ORDER — METFORMIN HCL ER 500 MG PO TB24: 500.0000 mg | ORAL_TABLET | Freq: Every day | ORAL | 1 refills | Status: DC

## 2019-10-28 NOTE — Telephone Encounter (Signed)
Left message on vm

## 2019-10-28 NOTE — Telephone Encounter (Signed)
-----   Message from Abbie Sons, MD sent at 10/28/2019  9:27 AM EDT ----- Testosterone level better at 676

## 2019-11-02 ENCOUNTER — Other Ambulatory Visit: Payer: Self-pay | Admitting: Internal Medicine

## 2019-11-02 MED ORDER — POTASSIUM CITRATE ER 10 MEQ (1080 MG) PO TBCR: 10.0000 meq | EXTENDED_RELEASE_TABLET | Freq: Two times a day (BID) | ORAL | 0 refills | Status: DC

## 2019-11-05 ENCOUNTER — Other Ambulatory Visit: Payer: Self-pay | Admitting: Internal Medicine

## 2019-11-10 ENCOUNTER — Encounter: Payer: Self-pay | Admitting: Urology

## 2019-11-10 ENCOUNTER — Other Ambulatory Visit: Payer: Self-pay | Admitting: *Deleted

## 2019-11-11 MED ORDER — TESTOSTERONE CYPIONATE 200 MG/ML IM SOLN
140.0000 mg | INTRAMUSCULAR | 1 refills | Status: DC
Start: 2019-11-11 — End: 2020-03-08

## 2019-11-15 NOTE — Progress Notes (Signed)
Patient ID: Keith Dinneen., male   DOB: 16-Sep-1955, 64 y.o.   MRN: [id number] Cardiology Office Note  Date:  11/16/2019   ID:  Keith Lemming., DOB Jan 01, 1956, MRN [id number]  PCP:  Einar Pheasant, MD   Chief Complaint  Patient presents with  . office visit    12 month F/U; Meds verbally reviewed with patient.    HPI:  Keith Gross is a very pleasant 64 year old gentleman syncope in 2010 felt secondary to low blood pressure that improved with medication changes,  chest pain symptoms at the beginning of April 2013,  stress test showing inferior lateral hypoperfusion consistent with large region of scar which was new from 2010,  cardiac catheterization showing severe ramus disease, with DES stent placed in May 2013. catheterization September 2013 for chest pain showing patent stent, distal left circumflex disease noted, moderate in nature. He presents today for follow-up of his coronary artery disease  Doing well on today's visit Weight up 4 pounds from last clinic visit 1 year ago  He was previously working out with Physiological scientist, reports he has been busy at work, less exercise  Reports blood pressure stable, tolerating his medications  Lab work reviewed, total cholesterol 134 LDL 64  EKG personally reviewed by myself on todays visit Shows normal sinus rhythm rate 76 bpm no significant ST or T wave changes, right bundle branch block  Other past medical history reviewed  CPAP for sleep apnea   previous history of smoking, quit in 1992.  Father had his first heart attack at age 61.  PMH:   has a past medical history of Acid reflux, Angina, Anxiety, Arthritis, Diastasis of muscle (2011), Dysplastic polyp of colon (2008), GERD (gastroesophageal reflux disease), Gout, Heart disease, Hyperlipidemia, Hypertension, Kidney stones, Myocardial infarction ([place]) (05/2011), Sleep apnea, Syncopal episodes, and Treadmill stress test negative for angina pectoris (2011).  PSH:    Past  Surgical History:  Procedure Laterality Date  . CARDIAC CATHETERIZATION  06/2011   "diagnostic"  . COLONOSCOPY    . CORONARY ANGIOPLASTY WITH STENT PLACEMENT  07/09/11   "1"  . HEMORRHOID SURGERY  2000  . [place]   left  . LEFT HEART CATHETERIZATION WITH CORONARY ANGIOGRAM N/A 10/21/2011   Procedure: LEFT HEART CATHETERIZATION WITH CORONARY ANGIOGRAM;  Surgeon: Hillary Bow, MD;  Location[facility] CATH LAB;  Service: Cardiovascular;  Laterality: N/A;  . LITHOTRIPSY  10/2008  . PERCUTANEOUS CORONARY STENT INTERVENTION (PCI-S) N/A 07/09/2011   Procedure: PERCUTANEOUS CORONARY STENT INTERVENTION (PCI-S);  Surgeon: Sherren Mocha, MD;  Location[facility] C[facility] LAB;  Service: Cardiovascular;  Laterality: N/A;  . UPPER GI ENDOSCOPY    . URETEROSCOPY  10/2009    Current Outpatient Medications  Medication Sig Dispense Refill  . zinc gluconate 50 MG tablet Take 50 mg by mouth daily.    Marland Kitchen amLODipine (NORVASC) 10 MG tablet Take 1 tablet (10 mg total) by mouth daily. 90 tablet 3  . atorvastatin (LIPITOR) 20 MG tablet Take 1 tablet (20 mg total) by mouth daily. 90 tablet 3  . B-D 3CC LUER-LOK SYR 22GX1" 22G X 1" 3 ML MISC FOR TESTOSTERONE INJECTIONS 25 each 0  . BD HYPODERMIC NEEDLE 16G X 1" MISC FOR USE WITH TESTOSTERONE INJECTIONS 25 each 3  . cetirizine (ZYRTEC) 10 MG tablet Take 10 mg by mouth daily.    Marland Kitchen CINNAMON PO Take 1,000 mg by mouth 2 (two) times daily.     . clopidogrel (PLAVIX) 75 MG  tablet Take 1 tablet (75 mg total) by mouth daily. 90 tablet 3  . fluticasone (FLONASE) 50 MCG/ACT nasal spray USE 2 SPRAYS IN EACH NOSTRIL DAILY AS DIRECTED 48 g 1  . glucose blood test strip Use as instructed to check sugars once daily. Dx E11.9 100 each 12  . hydrochlorothiazide (HYDRODIURIL) 25 MG tablet Take 1 tablet (25 mg total) by mouth at bedtime. 90 tablet 3  . hydrOXYzine (ATARAX/VISTARIL) 50 MG tablet Take 50 mg by mouth 2 (two) times daily.    Marland Kitchen lamoTRIgine (LAMICTAL) 100 MG tablet  Takes 2 tablets am and 1 tablet pm daily.  0  . Lancets (ONETOUCH ULTRASOFT) lancets Use as instructed to check sugars once daily. Dx E 11.9 100 each 12  . magnesium oxide (MAG-OX) 400 MG tablet Take 400 mg by mouth daily.    . metFORMIN (GLUCOPHAGE XR) 500 MG 24 hr tablet Take 1 tablet (500 mg total) by mouth daily with breakfast. 30 tablet 1  . nitroGLYCERIN (NITROSTAT) 0.4 MG SL tablet Place 1 tablet (0.4 mg total) under the tongue every 5 (five) minutes as needed for chest pain. 25 tablet 2  . olmesartan-hydrochlorothiazide (BENICAR HCT) 40-25 MG tablet Take 1 tablet by mouth daily. 90 tablet 3  . pantoprazole (PROTONIX) 40 MG tablet TAKE ONE TABLET BY MOUTH DAILY 90 tablet 0  . polyethylene glycol powder (GLYCOLAX/MIRALAX) powder TAKE 17 GRAMS BY MOUTH EVERY DAY as needed (as directed) 527 g 1  . potassium citrate (UROCIT-K) 10 MEQ (1080 MG) SR tablet TAKE ONE TABLET BY MOUTH TWICE A DAY 180 tablet 0  . Psyllium (METAMUCIL PO) Take by mouth.    . Syringe, Disposable, (B-D SYRINGE LUER-LOK 3CC) 3 ML MISC 1 mL by Does not apply route every 14 (fourteen) days. 25 each 0  . tamsulosin (FLOMAX) 0.4 MG CAPS capsule TAKE ONE CAPSULE BY MOUTH DAILY 90 capsule 1  . testosterone cypionate (DEPOTESTOSTERONE CYPIONATE) 200 MG/ML injection Inject 0.7 mLs (140 mg total) into the muscle once a week. INJECT 0.5 MLS INTRAMUSCULARLY ONCE A WEEK 6 mL 1  . traZODone (DESYREL) 100 MG tablet Take 200 mg by mouth at bedtime.     Marland Kitchen venlafaxine XR (EFFEXOR-XR) 150 MG 24 hr capsule Take 150 mg by mouth daily with breakfast.    . VITAMIN D, CHOLECALCIFEROL, PO Take 2,000 mg by mouth daily.    Marland Kitchen VITAMIN E PO Take 2,000 Units by mouth daily.     No current facility-administered medications for this visit.    Allergies:   Patient has no known allergies.   Social History:  The patient  reports that he quit smoking about 29 years ago. His smoking use included cigarettes. He has a 10.00 pack-year smoking history. He has  never used smokeless tobacco. He reports previous alcohol use of about 4.0 standard drinks of alcohol per week. He reports current drug use. Frequency: 7.00 times per week. Drug: Marijuana.   Family History:   family history includes Heart attack (age of onset: 3) in his father; Hypertension in an other family member.    Review of Systems: Review of Systems  Constitutional: Negative.   Respiratory: Negative.   Cardiovascular: Negative.   Gastrointestinal: Negative.   Musculoskeletal: Negative.   Neurological: Negative.   Psychiatric/Behavioral: Negative.   All other systems reviewed and are negative.   PHYSICAL EXAM: VS:  BP 140/74 (BP Location: Right Arm, Patient Position: Sitting, Cuff Size: Normal)   Pulse 76   Ht 6' (1.829 m)  Wt 222 lb 2 oz (100.8 kg)   SpO2 98%   BMI 30.13 kg/m  , BMI Body mass index is 30.13 kg/m. Constitutional:  oriented to person, place, and time. No distress.  HENT:  Head: Grossly normal Eyes:  no discharge. No scleral icterus.  Neck: No JVD, no carotid bruits  Cardiovascular: Regular rate and rhythm, no murmurs appreciated Pulmonary/Chest: Clear to auscultation bilaterally, no wheezes or rails Abdominal: Soft.  no distension.  no tenderness.  Musculoskeletal: Normal range of motion Neurological:  normal muscle tone. Coordination normal. No atrophy Skin: Skin warm and dry Psychiatric: normal affect, pleasant   Recent Labs: 01/03/2019: TSH 3.06 06/30/2019: Hemoglobin 15.7; Platelets 162.0 09/15/2019: ALT 20; BUN 19; Creatinine, Ser 1.10; Potassium 4.0; Sodium 138    Lipid Panel Lab Results  Component Value Date   CHOL 134 09/15/2019   HDL 32.40 (L) 09/15/2019   LDLCALC 64 09/15/2019   TRIG 191.0 (H) 09/15/2019      Wt Readings from Last 3 Encounters:  11/16/19 222 lb 2 oz (100.8 kg)  10/19/19 222 lb (100.7 kg)  09/08/19 220 lb (99.8 kg)       ASSESSMENT AND PLAN:  Coronary artery disease due to calcified coronary lesion  - Currently with no symptoms of angina. No further workup at this time. Continue current medication regimen.  Essential hypertension - Plan: EKG 12-Lead Blood pressure is well controlled on today's visit. No changes made to the medications. Medications refilled  Hyperlipidemia Cholesterol is at goal on the current lipid regimen. No changes to the medications were made. Stable  Borderline diabetes A1c 6.3, diet and exercise recommended  Anxiety Reports symptoms are stable, recommended continued exercise   Total encounter time more than 25 minutes  Greater than 50% was spent in counseling and coordination of care with the patient     Orders Placed This Encounter  Procedures  . EKG 12-Lead     Signed, Esmond Plants, M.D., Ph.D. 11/16/2019  [place], [place]

## 2019-11-16 ENCOUNTER — Encounter: Payer: Self-pay | Admitting: Cardiovascular Disease

## 2019-11-16 ENCOUNTER — Ambulatory Visit (INDEPENDENT_AMBULATORY_CARE_PROVIDER_SITE_OTHER): Payer: BLUE CROSS/BLUE SHIELD | Admitting: Cardiovascular Disease

## 2019-11-16 ENCOUNTER — Other Ambulatory Visit: Payer: Self-pay

## 2019-11-16 VITALS — BP 140/74 | HR 76 | Ht 72.0 in | Wt 222.1 lb

## 2019-11-16 DIAGNOSIS — I10 Essential (primary) hypertension: Secondary | ICD-10-CM | POA: Diagnosis not present

## 2019-11-16 DIAGNOSIS — E1159 Type 2 diabetes mellitus with other circulatory complications: Secondary | ICD-10-CM | POA: Diagnosis not present

## 2019-11-16 DIAGNOSIS — I25118 Atherosclerotic heart disease of native coronary artery with other forms of angina pectoris: Secondary | ICD-10-CM | POA: Diagnosis not present

## 2019-11-16 DIAGNOSIS — E782 Mixed hyperlipidemia: Secondary | ICD-10-CM

## 2019-11-16 MED ORDER — OLMESARTAN MEDOXOMIL-HCTZ 40-25 MG PO TABS: 1.0000 | ORAL_TABLET | Freq: Every day | ORAL | 3 refills | Status: DC

## 2019-11-16 MED ORDER — ATORVASTATIN CALCIUM 20 MG PO TABS
20.0000 mg | ORAL_TABLET | Freq: Every day | ORAL | 3 refills | Status: DC
Start: 2019-11-16 — End: 2020-12-25

## 2019-11-16 MED ORDER — AMLODIPINE BESYLATE 10 MG PO TABS
10.0000 mg | ORAL_TABLET | Freq: Every day | ORAL | 3 refills | Status: DC
Start: 2019-11-16 — End: 2020-06-22

## 2019-11-16 MED ORDER — CLOPIDOGREL BISULFATE 75 MG PO TABS
75.0000 mg | ORAL_TABLET | Freq: Every day | ORAL | 3 refills | Status: DC
Start: 2019-11-16 — End: 2021-02-06

## 2019-11-16 MED ORDER — HYDROCHLOROTHIAZIDE 25 MG PO TABS
25.0000 mg | ORAL_TABLET | Freq: Every day | ORAL | 3 refills | Status: DC
Start: 2019-11-16 — End: 2020-06-22

## 2019-11-16 NOTE — Patient Instructions (Signed)
Medication Instructions:  No changes  If you need a refill on your cardiac medications before your next appointment, please call your pharmacy.    Lab work: No new labs needed   If you have labs (blood work) drawn today and your tests are completely normal, you will receive your results only by: . MyChart Message (if you have MyChart) OR . A paper copy in the mail If you have any lab test that is abnormal or we need to change your treatment, we will call you to review the results.   Testing/Procedures: No new testing needed   Follow-Up: At [facility], you and your health needs are our priority.  As part of our continuing mission to provide you with exceptional heart care, we have created designated Provider Care Teams.  These Care Teams include your primary Cardiologist (physician) and Advanced Practice Providers (APPs -  Physician Assistants and Nurse Practitioners) who all work together to provide you with the care you need, when you need it.  . You will need a follow up appointment in 12 months  . Providers on your designated Care Team:   . Christopher Berge, NP . Ryan Dunn, PA-C . Jacquelyn Visser, PA-C  Any Other Special Instructions Will Be Listed Below (If Applicable).  COVID-19 Vaccine Information can be found at: [email or web address] For questions related to vaccine distribution or appointments, please email [email or web address] or call [phone].     

## 2019-12-17 ENCOUNTER — Other Ambulatory Visit: Payer: Self-pay | Admitting: Internal Medicine

## 2019-12-28 DIAGNOSIS — F3181 Bipolar II disorder: Secondary | ICD-10-CM | POA: Diagnosis not present

## 2019-12-28 DIAGNOSIS — G4700 Insomnia, unspecified: Secondary | ICD-10-CM | POA: Diagnosis not present

## 2019-12-28 DIAGNOSIS — F1220 Cannabis dependence, uncomplicated: Secondary | ICD-10-CM | POA: Diagnosis not present

## 2020-01-26 ENCOUNTER — Other Ambulatory Visit: Payer: Self-pay | Admitting: Internal Medicine

## 2020-01-31 ENCOUNTER — Other Ambulatory Visit: Payer: Self-pay

## 2020-01-31 ENCOUNTER — Other Ambulatory Visit (INDEPENDENT_AMBULATORY_CARE_PROVIDER_SITE_OTHER): Payer: BLUE CROSS/BLUE SHIELD

## 2020-01-31 DIAGNOSIS — E782 Mixed hyperlipidemia: Secondary | ICD-10-CM

## 2020-01-31 DIAGNOSIS — I10 Essential (primary) hypertension: Secondary | ICD-10-CM

## 2020-01-31 DIAGNOSIS — E1159 Type 2 diabetes mellitus with other circulatory complications: Secondary | ICD-10-CM | POA: Diagnosis not present

## 2020-01-31 LAB — HEPATIC FUNCTION PANEL
ALT: 22 U/L (ref 0–53)
AST: 20 U/L (ref 0–37)
Albumin: 4.4 g/dL (ref 3.5–5.2)
Alkaline Phosphatase: 52 U/L (ref 39–117)
Bilirubin, Direct: 0.1 mg/dL (ref 0.0–0.3)
Total Bilirubin: 0.5 mg/dL (ref 0.2–1.2)
Total Protein: 6.8 g/dL (ref 6.0–8.3)

## 2020-01-31 LAB — BASIC METABOLIC PANEL
BUN: 21 mg/dL (ref 6–23)
CO2: 32 mEq/L (ref 19–32)
Calcium: 9.4 mg/dL (ref 8.4–10.5)
Chloride: 100 mEq/L (ref 96–112)
Creatinine, Ser: 1.31 mg/dL (ref 0.40–1.50)
GFR: 57.94 mL/min — ABNORMAL LOW (ref >60.00)
Glucose, Bld: 176 mg/dL — ABNORMAL HIGH (ref 70–99)
Potassium: 4 mEq/L (ref 3.5–5.1)
Sodium: 140 mEq/L (ref 135–145)

## 2020-01-31 LAB — CBC WITH DIFFERENTIAL/PLATELET
Basophils Absolute: 0 10*3/uL (ref 0.0–0.1)
Basophils Relative: 0.6 % (ref 0.0–3.0)
Eosinophils Absolute: 0.1 10*3/uL (ref 0.0–0.7)
Eosinophils Relative: 1.3 % (ref 0.0–5.0)
HCT: 48.8 % (ref 39.0–52.0)
Hemoglobin: 16.4 g/dL (ref 13.0–17.0)
Lymphocytes Relative: 22.1 % (ref 12.0–46.0)
Lymphs Abs: 1.6 10*3/uL (ref 0.7–4.0)
MCHC: 33.7 g/dL (ref 30.0–36.0)
MCV: 90.2 fl (ref 78.0–100.0)
Monocytes Absolute: 0.7 10*3/uL (ref 0.1–1.0)
Monocytes Relative: 9.4 % (ref 3.0–12.0)
Neutro Abs: 4.9 10*3/uL (ref 1.4–7.7)
Neutrophils Relative %: 66.6 % (ref 43.0–77.0)
Platelets: 166 10*3/uL (ref 150.0–400.0)
RBC: 5.41 Mil/uL (ref 4.22–5.81)
RDW: 13.5 % (ref 11.5–15.5)
WBC: 7.3 10*3/uL (ref 4.0–10.5)

## 2020-01-31 LAB — LIPID PANEL
Cholesterol: 140 mg/dL (ref 0–200)
HDL: 26.9 mg/dL — ABNORMAL LOW (ref >39.00)
NonHDL: 113.3
Total CHOL/HDL Ratio: 5
Triglycerides: 320 mg/dL — ABNORMAL HIGH (ref 0.0–149.0)
VLDL: 64 mg/dL — ABNORMAL HIGH (ref 0.0–40.0)

## 2020-01-31 LAB — HEMOGLOBIN A1C: Hgb A1c MFr Bld: 7 % — ABNORMAL HIGH (ref 4.6–6.5)

## 2020-01-31 LAB — LDL CHOLESTEROL, DIRECT: Direct LDL: 84 mg/dL

## 2020-01-31 LAB — TSH: TSH: 6.56 u[IU]/mL — ABNORMAL HIGH (ref 0.35–4.50)

## 2020-02-02 ENCOUNTER — Other Ambulatory Visit: Payer: Self-pay

## 2020-02-02 ENCOUNTER — Ambulatory Visit (INDEPENDENT_AMBULATORY_CARE_PROVIDER_SITE_OTHER): Payer: BLUE CROSS/BLUE SHIELD | Admitting: Internal Medicine

## 2020-02-02 VITALS — BP 118/70 | HR 70 | Temp 98.2°F | Resp 16 | Ht 72.0 in | Wt 216.0 lb

## 2020-02-02 DIAGNOSIS — R7989 Other specified abnormal findings of blood chemistry: Secondary | ICD-10-CM | POA: Insufficient documentation

## 2020-02-02 DIAGNOSIS — I2089 Other forms of angina pectoris: Secondary | ICD-10-CM

## 2020-02-02 DIAGNOSIS — Z0000 Encounter for general adult medical examination without abnormal findings: Secondary | ICD-10-CM

## 2020-02-02 DIAGNOSIS — E1159 Type 2 diabetes mellitus with other circulatory complications: Secondary | ICD-10-CM

## 2020-02-02 DIAGNOSIS — K2100 Gastro-esophageal reflux disease with esophagitis, without bleeding: Secondary | ICD-10-CM

## 2020-02-02 DIAGNOSIS — M79645 Pain in left finger(s): Secondary | ICD-10-CM

## 2020-02-02 DIAGNOSIS — Z23 Encounter for immunization: Secondary | ICD-10-CM

## 2020-02-02 DIAGNOSIS — I208 Other forms of angina pectoris: Secondary | ICD-10-CM

## 2020-02-02 DIAGNOSIS — D696 Thrombocytopenia, unspecified: Secondary | ICD-10-CM

## 2020-02-02 DIAGNOSIS — G4730 Sleep apnea, unspecified: Secondary | ICD-10-CM

## 2020-02-02 DIAGNOSIS — I10 Essential (primary) hypertension: Secondary | ICD-10-CM | POA: Diagnosis not present

## 2020-02-02 DIAGNOSIS — R945 Abnormal results of liver function studies: Secondary | ICD-10-CM

## 2020-02-02 DIAGNOSIS — F419 Anxiety disorder, unspecified: Secondary | ICD-10-CM

## 2020-02-02 LAB — HM DIABETES FOOT EXAM

## 2020-02-02 NOTE — Progress Notes (Signed)
Patient ID: Keith Gross., male   DOB: [birth year], 64 y.o.   MRN: [id number]   Subjective:    Patient ID: Keith Gross., male    DOB: 28-Feb-1955, 64 y.o.   MRN: [id number]  HPI This visit occurred during the SARS-CoV-2 public health emergency.  Safety protocols were in place, including screening questions prior to the visit, additional usage of staff PPE, and extensive cleaning of exam room while observing appropriate contact time as indicated for disinfecting solutions.  Patient here for his physical exam.  He reports he is doing relatively well.  Handling stress.  Seeing Dr Nicolasa Ducking.  His current regimen is working well.  Tries to stay active.  Not exercising as much.  No chest pain or sob reported.  No abdominal pain or bowel change reported.  Some joint aches.  Specifically more pain - base of left thumb.  Desires further evaluation - injection.    Past Medical History:  Diagnosis Date  . Acid reflux   . Angina   . Anxiety   . Arthritis   . Diastasis of muscle 2011   "abdominal"  . Dysplastic polyp of colon 2008   [facility]  . GERD (gastroesophageal reflux disease)   . Gout    Multiple episodes  . Heart disease   . Hyperlipidemia   . Hypertension   . Kidney stones   . Myocardial infarction [facility]) 05/2011   "we think";  06/2011 abnl mv;  06/2011 Cath - subtotal occlusion of Ramus, otw nonobs dzs;  06/2011 PCI/DES of Ramus w/ 2.25x48m Resolute DES   . Sleep apnea    on CPAP  . Syncopal episodes   . Treadmill stress test negative for angina pectoris 2011   Past Surgical History:  Procedure Laterality Date  . CARDIAC CATHETERIZATION  06/2011   "diagnostic"  . COLONOSCOPY    . CORONARY ANGIOPLASTY WITH STENT PLACEMENT  07/09/11   "1"  . HEMORRHOID SURGERY  2000  . [place]  left  . LEFT HEART CATHETERIZATION WITH CORONARY ANGIOGRAM N/A 10/21/2011   Procedure: LEFT HEART CATHETERIZATION WITH CORONARY ANGIOGRAM;  Surgeon: THillary Bow MD;  [facility];  Service: Cardiovascular;  Laterality: N/A;  . LITHOTRIPSY  10/2008  . PERCUTANEOUS CORONARY STENT INTERVENTION (PCI-S) N/A 07/09/2011   Procedure: PERCUTANEOUS CORONARY STENT INTERVENTION (PCI-S);  Surgeon: MSherren Mocha MD;  [facility];  Service: Cardiovascular;  Laterality: N/A;  . UPPER GI ENDOSCOPY    . URETEROSCOPY  10/2009   Family History  Problem Relation Age of Onset  . Heart attack Father 440      LVAD  . Hypertension Other    Social History   Socioeconomic History  . Marital status: Married    Spouse name: Not on file  . Number of children: 4  . Years of education: Not on file  . Highest education level: Not on file  Occupational History  . 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 Smoking status: Former Smoker    Packs/day: 0.50    Years: 20.00    Pack years: 10.00    Types: Cigarettes    Quit date: 02/17/1990    Years since quitting: 30.0  . Smokeless tobacco: Never Used  Vaping Use  . Vaping Use: Never used  Substance and Sexual Activity  . Alcohol use: Not Currently    Alcohol/week: 4.0 standard drinks    Types: 2 Glasses of wine, 2 Cans  of beer per week  . Drug use: Yes    Frequency: 7.0 times per week    Types: Marijuana    Comment: 07/09/11 "lets say a cigarette a day"  . Sexual activity: Yes  Other Topics Concern  . Not on file  Social History Narrative   Married and has 4 children.     Social Determinants of Health   Financial Resource Strain: Not on file  Food Insecurity: Not on file  Transportation Needs: Not on file  Physical Activity: Not on file  Stress: Not on file  Social Connections: Not on file    Outpatient Encounter Medications as of 02/02/2020  Medication Sig  . amLODipine (NORVASC) 10 MG tablet Take 1 tablet (10 mg total) by mouth daily.  Marland Kitchen atorvastatin (LIPITOR) 20 MG tablet Take 1 tablet (20 mg total) by mouth daily.  . B-D 3CC LUER-LOK SYR 22GX1" 22G X 1" 3 ML MISC FOR TESTOSTERONE INJECTIONS  .  BD HYPODERMIC NEEDLE 16G X 1" MISC FOR USE WITH TESTOSTERONE INJECTIONS  . cetirizine (ZYRTEC) 10 MG tablet Take 10 mg by mouth daily.  Marland Kitchen CINNAMON PO Take 1,000 mg by mouth 2 (two) times daily.   . clopidogrel (PLAVIX) 75 MG tablet Take 1 tablet (75 mg total) by mouth daily.  . fluticasone (FLONASE) 50 MCG/ACT nasal spray USE 2 SPRAYS IN EACH NOSTRIL DAILY AS DIRECTED  . glucose blood test strip Use as instructed to check sugars once daily. Dx E11.9  . hydrochlorothiazide (HYDRODIURIL) 25 MG tablet Take 1 tablet (25 mg total) by mouth at bedtime.  . hydrOXYzine (ATARAX/VISTARIL) 50 MG tablet Take 50 mg by mouth 2 (two) times daily.  Marland Kitchen lamoTRIgine (LAMICTAL) 100 MG tablet Takes 2 tablets am and 1 tablet pm daily.  . Lancets (ONETOUCH ULTRASOFT) lancets Use as instructed to check sugars once daily. Dx E 11.9  . magnesium oxide (MAG-OX) 400 MG tablet Take 400 mg by mouth daily.  . metFORMIN (GLUCOPHAGE-XR) 500 MG 24 hr tablet TAKE ONE TABLET BY MOUTH DAILY WITH BREAKFAST  . nitroGLYCERIN (NITROSTAT) 0.4 MG SL tablet Place 1 tablet (0.4 mg total) under the tongue every 5 (five) minutes as needed for chest pain.  Marland Kitchen olmesartan-hydrochlorothiazide (BENICAR HCT) 40-25 MG tablet Take 1 tablet by mouth daily.  . pantoprazole (PROTONIX) 40 MG tablet TAKE ONE TABLET BY MOUTH DAILY  . polyethylene glycol powder (GLYCOLAX/MIRALAX) powder TAKE 17 GRAMS BY MOUTH EVERY DAY as needed (as directed)  . potassium citrate (UROCIT-K) 10 MEQ (1080 MG) SR tablet TAKE ONE TABLET BY MOUTH TWICE A DAY  . Syringe, Disposable, (B-D SYRINGE LUER-LOK 3CC) 3 ML MISC 1 mL by Does not apply route every 14 (fourteen) days.  . tamsulosin (FLOMAX) 0.4 MG CAPS capsule TAKE ONE CAPSULE BY MOUTH DAILY  . testosterone cypionate (DEPOTESTOSTERONE CYPIONATE) 200 MG/ML injection Inject 0.7 mLs (140 mg total) into the muscle once a week. INJECT 0.5 MLS INTRAMUSCULARLY ONCE A WEEK  . traZODone (DESYREL) 100 MG tablet Take 200 mg by mouth at  bedtime.   Marland Kitchen venlafaxine XR (EFFEXOR-XR) 150 MG 24 hr capsule Take 150 mg by mouth daily with breakfast.  . VITAMIN D, CHOLECALCIFEROL, PO Take 2,000 mg by mouth daily.  Marland Kitchen VITAMIN E PO Take 2,000 Units by mouth daily.  Marland Kitchen zinc gluconate 50 MG tablet Take 50 mg by mouth daily.  . [DISCONTINUED] Psyllium (METAMUCIL PO) Take by mouth.   No facility-administered encounter medications on file as of 02/02/2020.    Review of  Systems  Constitutional: Negative for appetite change and unexpected weight change.  HENT: Negative for congestion, sinus pressure and sore throat.   Eyes: Negative for pain and visual disturbance.  Respiratory: Negative for cough, chest tightness and shortness of breath.   Cardiovascular: Negative for chest pain, palpitations and leg swelling.  Gastrointestinal: Negative for abdominal pain, diarrhea, nausea and vomiting.  Genitourinary: Negative for difficulty urinating and dysuria.  Musculoskeletal: Negative for joint swelling and myalgias.  Skin: Negative for color change and rash.  Neurological: Negative for dizziness, light-headedness and headaches.  Hematological: Negative for adenopathy. Does not bruise/bleed easily.  Psychiatric/Behavioral: Negative for agitation and dysphoric mood.       Objective:    Physical Exam Vitals reviewed.  Constitutional:      General: He is not in acute distress.    Appearance: Normal appearance. He is well-developed and well-nourished.  HENT:     Head: Normocephalic and atraumatic.     Right Ear: External ear normal.     Left Ear: External ear normal.     Mouth/Throat:     Mouth: Oropharynx is clear and moist.  Eyes:     General: No scleral icterus.       Right eye: No discharge.        Left eye: No discharge.     Conjunctiva/sclera: Conjunctivae normal.  Neck:     Thyroid: No thyromegaly.  Cardiovascular:     Rate and Rhythm: Normal rate and regular rhythm.  Pulmonary:     Effort: No respiratory distress.     Breath  sounds: Normal breath sounds. No wheezing.  Abdominal:     General: Bowel sounds are normal.     Palpations: Abdomen is soft.     Tenderness: There is no abdominal tenderness.  Musculoskeletal:        General: No swelling, tenderness or edema.     Cervical back: Neck supple. No tenderness.  Lymphadenopathy:     Cervical: No cervical adenopathy.  Skin:    Findings: No erythema or rash.  Neurological:     Mental Status: He is alert and oriented to person, place, and time.  Psychiatric:        Mood and Affect: Mood and affect and mood normal.        Behavior: Behavior normal.     BP 118/70   Pulse 70   Temp 98.2 F (36.8 C) (Oral)   Resp 16   Ht 6' (1.829 m)   Wt 216 lb (98 kg)   SpO2 97%   BMI 29.29 kg/m  Wt Readings from Last 3 Encounters:  02/02/20 216 lb (98 kg)  11/16/19 222 lb 2 oz (100.8 kg)  10/19/19 222 lb (100.7 kg)     Lab Results  Component Value Date   WBC 7.3 01/31/2020   HGB 16.4 01/31/2020   HCT 48.8 01/31/2020   PLT 166.0 01/31/2020   GLUCOSE 176 (H) 01/31/2020   CHOL 140 01/31/2020   TRIG 320.0 (H) 01/31/2020   HDL 26.90 (L) 01/31/2020   LDLDIRECT 84.0 01/31/2020   LDLCALC 64 09/15/2019   ALT 22 01/31/2020   AST 20 01/31/2020   NA 140 01/31/2020   K 4.0 01/31/2020   CL 100 01/31/2020   CREATININE 1.31 01/31/2020   BUN 21 01/31/2020   CO2 32 01/31/2020   TSH 6.56 (H) 01/31/2020   INR 1.11 10/19/2011   HGBA1C 7.0 (H) 01/31/2020   MICROALBUR 0.7 06/30/2019    .  Assessment & Plan:   Problem List Items Addressed This Visit    Hypertension    Blood pressure as outlined.  On hctz and amlodipine.  Follow pressures.  Follow metabolic panel.       GERD (gastroesophageal reflux disease)    On protonix.  No upper symptoms reported.  Follow.       Anxiety    Followed by Dr Nicolasa Ducking.  Doing well on current medication regimen.  Follow.       Sleep apnea    Continue cpap.       Thrombocytopenia ([place])    Follow cbc.        Abnormal liver function    Diet, exercise and weight loss.  Follow liver function tests.        Thumb pain    Persistent pain base of left thumb.  Request referral to Dr Jefm Bryant (Rheumatology).  Question if need for injection.       Relevant Orders   Ambulatory referral to Rheumatology   Health care maintenance    Physical today 02/02/20.  Sees urology for prostate checks.  Previously referred for f/u Dr Alain Marion - f/u colonoscopy.  Previously postponed due to covid.  Needs f/u colonoscopy.        Stable angina ([facility])    No chest pain currently.  Followed by cardiology.  On amlodipine, plavix and statin medication.  No chest pain.  Follow.       Diabetes mellitus with cardiac complication [facility])    Discussed recent labs.  a1c increased.  Low carb diet and exercise.  Follow met b and a1c.  Continue metformin.        Elevated TSH    Recheck tsh.  Recent check - TSH slightly elevated.        Relevant Orders   TSH    Other Visit Diagnoses    Need for immunization against influenza    -  Primary   Relevant Orders   Flu Vaccine QUAD 36+ mos IM (Completed)       Einar Pheasant, MD

## 2020-02-13 ENCOUNTER — Encounter: Payer: Self-pay | Admitting: Internal Medicine

## 2020-02-13 NOTE — Assessment & Plan Note (Signed)
Follow cbc.  

## 2020-02-13 NOTE — Assessment & Plan Note (Signed)
Continue cpap.  

## 2020-02-13 NOTE — Assessment & Plan Note (Signed)
Diet, exercise and weight loss. Follow liver function tests.   

## 2020-02-13 NOTE — Assessment & Plan Note (Signed)
On protonix.  No upper symptoms reported.  Follow.

## 2020-02-13 NOTE — Assessment & Plan Note (Signed)
Physical today 02/02/20.  Sees urology for prostate checks.  Previously referred for f/u Dr Alain Marion - f/u colonoscopy.  Previously postponed due to covid.  Needs f/u colonoscopy.

## 2020-02-13 NOTE — Assessment & Plan Note (Signed)
Discussed recent labs.  a1c increased.  Low carb diet and exercise.  Follow met b and a1c.  Continue metformin.

## 2020-02-13 NOTE — Assessment & Plan Note (Signed)
Persistent pain base of left thumb.  Request referral to Dr Gavin Potters (Rheumatology).  Question if need for injection.

## 2020-02-13 NOTE — Assessment & Plan Note (Signed)
No chest pain currently.  Followed by cardiology.  On amlodipine, plavix and statin medication.  No chest pain.  Follow.

## 2020-02-13 NOTE — Assessment & Plan Note (Addendum)
Recheck tsh.  Recent check - TSH slightly elevated.

## 2020-02-13 NOTE — Assessment & Plan Note (Signed)
Blood pressure as outlined.  On hctz and amlodipine.  Follow pressures.  Follow metabolic panel.

## 2020-02-13 NOTE — Assessment & Plan Note (Signed)
Followed by Dr Kapur.  Doing well on current medication regimen.  Follow.  

## 2020-02-15 ENCOUNTER — Encounter: Payer: Self-pay | Admitting: Internal Medicine

## 2020-02-28 DIAGNOSIS — M1812 Unilateral primary osteoarthritis of first carpometacarpal joint, left hand: Secondary | ICD-10-CM | POA: Diagnosis not present

## 2020-02-28 DIAGNOSIS — M8949 Other hypertrophic osteoarthropathy, multiple sites: Secondary | ICD-10-CM | POA: Diagnosis not present

## 2020-02-29 ENCOUNTER — Encounter: Payer: Self-pay | Admitting: Internal Medicine

## 2020-03-08 ENCOUNTER — Other Ambulatory Visit: Payer: Self-pay | Admitting: *Deleted

## 2020-03-09 MED ORDER — TESTOSTERONE CYPIONATE 200 MG/ML IM SOLN: 140.0000 mg | INTRAMUSCULAR | 0 refills | Status: DC

## 2020-03-12 ENCOUNTER — Other Ambulatory Visit: Payer: Self-pay

## 2020-03-12 DIAGNOSIS — E291 Testicular hypofunction: Secondary | ICD-10-CM

## 2020-03-13 ENCOUNTER — Other Ambulatory Visit: Payer: Self-pay

## 2020-03-13 ENCOUNTER — Other Ambulatory Visit: Payer: Self-pay | Admitting: Urology

## 2020-03-13 ENCOUNTER — Other Ambulatory Visit: Payer: BC Managed Care – PPO

## 2020-03-13 DIAGNOSIS — E291 Testicular hypofunction: Secondary | ICD-10-CM | POA: Diagnosis not present

## 2020-03-14 LAB — TESTOSTERONE: Testosterone: 499 ng/dL (ref 264–916)

## 2020-03-14 LAB — PSA: Prostate Specific Ag, Serum: 0.4 ng/mL (ref 0.0–4.0)

## 2020-03-15 ENCOUNTER — Other Ambulatory Visit: Payer: Self-pay | Admitting: *Deleted

## 2020-03-15 ENCOUNTER — Other Ambulatory Visit: Payer: Self-pay | Admitting: Urology

## 2020-03-15 MED ORDER — "BD LUER-LOK SYRINGE 22G X 1"" 3 ML MISC": 0 refills | Status: AC

## 2020-03-15 MED ORDER — BD SYRINGE LUER-LOK 3 ML MISC: 1.0000 mL | 0 refills | Status: DC

## 2020-03-15 MED ORDER — TESTOSTERONE CYPIONATE 200 MG/ML IM SOLN: INTRAMUSCULAR | 0 refills | Status: DC

## 2020-03-20 ENCOUNTER — Encounter: Payer: Self-pay | Admitting: Internal Medicine

## 2020-03-20 ENCOUNTER — Other Ambulatory Visit: Payer: Self-pay | Admitting: Urology

## 2020-03-20 DIAGNOSIS — E291 Testicular hypofunction: Secondary | ICD-10-CM

## 2020-03-20 LAB — HM DIABETES EYE EXAM

## 2020-03-21 ENCOUNTER — Other Ambulatory Visit: Payer: Self-pay

## 2020-03-21 ENCOUNTER — Other Ambulatory Visit (INDEPENDENT_AMBULATORY_CARE_PROVIDER_SITE_OTHER): Payer: BC Managed Care – PPO

## 2020-03-21 DIAGNOSIS — R7989 Other specified abnormal findings of blood chemistry: Secondary | ICD-10-CM | POA: Diagnosis not present

## 2020-03-21 DIAGNOSIS — R946 Abnormal results of thyroid function studies: Secondary | ICD-10-CM | POA: Diagnosis not present

## 2020-03-21 LAB — TSH: TSH: 3.22 u[IU]/mL (ref 0.35–4.50)

## 2020-03-21 MED ORDER — METFORMIN HCL ER 500 MG PO TB24: 500.0000 mg | ORAL_TABLET | Freq: Every day | ORAL | 1 refills | Status: DC

## 2020-03-22 ENCOUNTER — Other Ambulatory Visit: Payer: Self-pay | Admitting: *Deleted

## 2020-03-23 ENCOUNTER — Telehealth: Payer: Self-pay

## 2020-03-23 ENCOUNTER — Other Ambulatory Visit: Payer: Self-pay | Admitting: Urology

## 2020-03-23 ENCOUNTER — Telehealth: Payer: Self-pay | Admitting: Urology

## 2020-03-23 ENCOUNTER — Other Ambulatory Visit: Payer: Self-pay | Admitting: Internal Medicine

## 2020-03-23 ENCOUNTER — Telehealth: Payer: Self-pay | Admitting: *Deleted

## 2020-03-23 MED ORDER — TESTOSTERONE CYPIONATE 200 MG/ML IM SOLN: 140.0000 mg | INTRAMUSCULAR | 0 refills | Status: DC

## 2020-03-23 NOTE — Telephone Encounter (Signed)
I talked with patient wife already. This message was a voice mail. Medication was sent.

## 2020-03-23 NOTE — Telephone Encounter (Signed)
Pt's wife LM on triage line stating that her husband is having "issues" and "symptoms" without his testosterone. She states that the dose was called in wrong and they are in "desperate" need of the RX being sent to [facility]. She states that this is her 10th call to our office.

## 2020-03-23 NOTE — Telephone Encounter (Signed)
Called and talked with Keith Gross to let her know the testosterone was sent to Keith Gross today.

## 2020-03-23 NOTE — Telephone Encounter (Signed)
Pt's wife came in to office trying to get pt's testosterone injection issue resolved.  Janett Billow said we were waiting for [facility] to sign, since wrong meds were sent by [facility] the previous week while [place] was on vacation.

## 2020-03-25 ENCOUNTER — Other Ambulatory Visit: Payer: Self-pay | Admitting: Internal Medicine

## 2020-03-26 ENCOUNTER — Other Ambulatory Visit: Payer: Self-pay

## 2020-03-26 MED ORDER — METFORMIN HCL ER 500 MG PO TB24: 500.0000 mg | ORAL_TABLET | Freq: Every day | ORAL | 1 refills | Status: DC

## 2020-03-27 DIAGNOSIS — F1220 Cannabis dependence, uncomplicated: Secondary | ICD-10-CM | POA: Diagnosis not present

## 2020-03-27 DIAGNOSIS — G4700 Insomnia, unspecified: Secondary | ICD-10-CM | POA: Diagnosis not present

## 2020-03-27 DIAGNOSIS — F3181 Bipolar II disorder: Secondary | ICD-10-CM | POA: Diagnosis not present

## 2020-03-29 ENCOUNTER — Encounter: Payer: Self-pay | Admitting: Internal Medicine

## 2020-03-30 ENCOUNTER — Other Ambulatory Visit: Payer: Self-pay

## 2020-03-30 MED ORDER — METFORMIN HCL ER 500 MG PO TB24: 500.0000 mg | ORAL_TABLET | Freq: Every day | ORAL | 1 refills | Status: DC

## 2020-03-30 MED ORDER — TAMSULOSIN HCL 0.4 MG PO CAPS: 0.4000 mg | ORAL_CAPSULE | Freq: Every day | ORAL | 1 refills | Status: DC

## 2020-05-16 ENCOUNTER — Encounter: Payer: Self-pay | Admitting: Internal Medicine

## 2020-05-16 ENCOUNTER — Other Ambulatory Visit: Payer: Self-pay

## 2020-05-16 MED ORDER — POTASSIUM CITRATE ER 10 MEQ (1080 MG) PO TBCR: 10.0000 meq | EXTENDED_RELEASE_TABLET | Freq: Two times a day (BID) | ORAL | 0 refills | Status: DC

## 2020-05-18 DIAGNOSIS — R55 Syncope and collapse: Secondary | ICD-10-CM

## 2020-05-18 HISTORY — DX: Syncope and collapse: R55

## 2020-05-31 ENCOUNTER — Telehealth: Payer: Self-pay | Admitting: *Deleted

## 2020-05-31 ENCOUNTER — Encounter: Payer: Self-pay | Admitting: Internal Medicine

## 2020-05-31 DIAGNOSIS — R945 Abnormal results of liver function studies: Secondary | ICD-10-CM

## 2020-05-31 DIAGNOSIS — I10 Essential (primary) hypertension: Secondary | ICD-10-CM

## 2020-05-31 DIAGNOSIS — E1159 Type 2 diabetes mellitus with other circulatory complications: Secondary | ICD-10-CM

## 2020-05-31 DIAGNOSIS — E782 Mixed hyperlipidemia: Secondary | ICD-10-CM

## 2020-05-31 NOTE — Telephone Encounter (Signed)
Please place future orders for lab appt.  

## 2020-06-01 NOTE — Telephone Encounter (Signed)
Orders placed for f/u labs.  

## 2020-06-05 ENCOUNTER — Encounter: Payer: Self-pay | Admitting: Internal Medicine

## 2020-06-05 ENCOUNTER — Other Ambulatory Visit (INDEPENDENT_AMBULATORY_CARE_PROVIDER_SITE_OTHER): Payer: BC Managed Care – PPO

## 2020-06-05 ENCOUNTER — Other Ambulatory Visit: Payer: Self-pay

## 2020-06-05 DIAGNOSIS — E782 Mixed hyperlipidemia: Secondary | ICD-10-CM | POA: Diagnosis not present

## 2020-06-05 DIAGNOSIS — E1159 Type 2 diabetes mellitus with other circulatory complications: Secondary | ICD-10-CM | POA: Diagnosis not present

## 2020-06-05 DIAGNOSIS — R945 Abnormal results of liver function studies: Secondary | ICD-10-CM | POA: Diagnosis not present

## 2020-06-05 DIAGNOSIS — I10 Essential (primary) hypertension: Secondary | ICD-10-CM

## 2020-06-05 LAB — LIPID PANEL
Cholesterol: 121 mg/dL (ref 0–200)
HDL: 28.2 mg/dL — ABNORMAL LOW (ref >39.00)
NonHDL: 93.17
Total CHOL/HDL Ratio: 4
Triglycerides: 258 mg/dL — ABNORMAL HIGH (ref 0.0–149.0)
VLDL: 51.6 mg/dL — ABNORMAL HIGH (ref 0.0–40.0)

## 2020-06-05 LAB — HEPATIC FUNCTION PANEL
ALT: 23 U/L (ref 0–53)
AST: 24 U/L (ref 0–37)
Albumin: 4.4 g/dL (ref 3.5–5.2)
Alkaline Phosphatase: 48 U/L (ref 39–117)
Bilirubin, Direct: 0.1 mg/dL (ref 0.0–0.3)
Total Bilirubin: 0.7 mg/dL (ref 0.2–1.2)
Total Protein: 6.9 g/dL (ref 6.0–8.3)

## 2020-06-05 LAB — BASIC METABOLIC PANEL
BUN: 23 mg/dL (ref 6–23)
CO2: 29 mEq/L (ref 19–32)
Calcium: 9.5 mg/dL (ref 8.4–10.5)
Chloride: 102 mEq/L (ref 96–112)
Creatinine, Ser: 1.26 mg/dL (ref 0.40–1.50)
GFR: 60.57 mL/min (ref >60.00)
Glucose, Bld: 140 mg/dL — ABNORMAL HIGH (ref 70–99)
Potassium: 4.1 mEq/L (ref 3.5–5.1)
Sodium: 138 mEq/L (ref 135–145)

## 2020-06-05 LAB — HEMOGLOBIN A1C: Hgb A1c MFr Bld: 6.5 % (ref 4.6–6.5)

## 2020-06-05 LAB — TSH: TSH: 2.79 u[IU]/mL (ref 0.35–4.50)

## 2020-06-05 LAB — LDL CHOLESTEROL, DIRECT: Direct LDL: 87 mg/dL

## 2020-06-07 ENCOUNTER — Encounter: Payer: Self-pay | Admitting: Internal Medicine

## 2020-06-07 ENCOUNTER — Telehealth (INDEPENDENT_AMBULATORY_CARE_PROVIDER_SITE_OTHER): Payer: BC Managed Care – PPO | Admitting: Internal Medicine

## 2020-06-07 VITALS — BP 130/80 | Ht 72.0 in | Wt 215.0 lb

## 2020-06-07 DIAGNOSIS — D696 Thrombocytopenia, unspecified: Secondary | ICD-10-CM

## 2020-06-07 DIAGNOSIS — Z125 Encounter for screening for malignant neoplasm of prostate: Secondary | ICD-10-CM | POA: Diagnosis not present

## 2020-06-07 DIAGNOSIS — G4730 Sleep apnea, unspecified: Secondary | ICD-10-CM

## 2020-06-07 DIAGNOSIS — Z860100 Personal history of colon polyps, unspecified: Secondary | ICD-10-CM

## 2020-06-07 DIAGNOSIS — F419 Anxiety disorder, unspecified: Secondary | ICD-10-CM

## 2020-06-07 DIAGNOSIS — R55 Syncope and collapse: Secondary | ICD-10-CM

## 2020-06-07 DIAGNOSIS — E1159 Type 2 diabetes mellitus with other circulatory complications: Secondary | ICD-10-CM | POA: Diagnosis not present

## 2020-06-07 DIAGNOSIS — K2100 Gastro-esophageal reflux disease with esophagitis, without bleeding: Secondary | ICD-10-CM

## 2020-06-07 DIAGNOSIS — I25118 Atherosclerotic heart disease of native coronary artery with other forms of angina pectoris: Secondary | ICD-10-CM

## 2020-06-07 DIAGNOSIS — I10 Essential (primary) hypertension: Secondary | ICD-10-CM | POA: Diagnosis not present

## 2020-06-07 DIAGNOSIS — E782 Mixed hyperlipidemia: Secondary | ICD-10-CM

## 2020-06-07 NOTE — Assessment & Plan Note (Signed)
He had adjusted his diet.  Is exercising.  Off metformin.  a1c just checked and improved 6.5.  Continue diet and exercise.  Remain off metformin.  Follow met b and a1c.

## 2020-06-07 NOTE — Assessment & Plan Note (Signed)
Continue lipitor.  Low cholesterol diet and exercise.  Follow lipid panel and liver function tests.  Triglycerides improved from last check.   Lab Results  Component Value Date   CHOL 121 06/05/2020   HDL 28.20 (L) 06/05/2020   LDLCALC 64 09/15/2019   LDLDIRECT 87.0 06/05/2020   TRIG 258.0 (H) 06/05/2020   CHOLHDL 4 06/05/2020

## 2020-06-07 NOTE — Progress Notes (Signed)
Patient ID: Keith Gross., male   DOB: 1955-08-07, 65 y.o.   MRN: [id number]   Virtual Visit via video Note  This visit type was conducted due to national recommendations for restrictions regarding the COVID-19 pandemic (e.g. social distancing).  This format is felt to be most appropriate for this patient at this time.  All issues noted in this document were discussed and addressed.  No physical exam was performed (except for noted visual exam findings with Video Visits).   I connected with Keith Gross by a video enabled telemedicine application or telephone and verified that I am speaking with the correct Gross using two identifiers. Location patient: home Location provider: home office Persons participating in the virtual visit: patient, provider  The limitations, risks, security and privacy concerns of performing an evaluation and management service by video and the availability of in Gross appointments have been discussed.  It has also been discussed with the patient that there may be a patient responsible charge related to this service. The patient expressed understanding and agreed to proceed.   Reason for visit: scheduled follow up.   HPI: Follow up regarding his blood sugar, blood pressure and cholesterol.  He reports he has been doing relatively well.  He has adjusted his diet and has been doing better watching what he eats.  Discussed recent labs.  a1c 6.5 (down from 7.0 last check).  Off metformin since 02/2020.  Did not feel good on the medication.  He has been exercising.  Denies any chest pain or chest tightness with increased activity or exertion.  No sob.  He does report that over the last 6 weeks, he has had two episodes where he felt like he was going to pass out.  No actual syncope.  Was not associated with exercise.  Was sitting - relaxing.  Got up - not fast and noticed the "spell".  No room spinning.  States lasted a few minutes and resolved.  Did not check blood sugar.   Drank some pepsi and sat for a while and felt back to normal.  Second episode - similar.  Ate some crackers.  Did not check blood sugar with either episode, but has not had an issue with the blood sugar dropping too low. Eating regular meals.  Good appetite.  No nausea or vomiting.  Bowels stable.      ROS: See pertinent positives and negatives per HPI.  Past Medical History:  Diagnosis Date  . Acid reflux   . Angina   . Anxiety   . Arthritis   . Diastasis of muscle 2011   "abdominal"  . Dysplastic polyp of colon 2008   [facility]  . GERD (gastroesophageal reflux disease)   . Gout    Multiple episodes  . Heart disease   . Hyperlipidemia   . Hypertension   . Kidney stones   . Myocardial infarction [facility]) 05/2011   "we think";  06/2011 abnl mv;  06/2011 Cath - subtotal occlusion of Ramus, otw nonobs dzs;  06/2011 PCI/DES of Ramus w/ 2.25x17mm Resolute DES   . Sleep apnea    on CPAP  . Syncopal episodes   . Treadmill stress test negative for angina pectoris 2011    Past Surgical History:  Procedure Laterality Date  . CARDIAC CATHETERIZATION  06/2011   "diagnostic"  . COLONOSCOPY    . CORONARY ANGIOPLASTY WITH STENT PLACEMENT  07/09/11   "1"  . HEMORRHOID SURGERY  2000  . [place]  left  . LEFT HEART CATHETERIZATION WITH CORONARY ANGIOGRAM N/A 10/21/2011   Procedure: LEFT HEART CATHETERIZATION WITH CORONARY ANGIOGRAM;  Surgeon: Hillary Bow, MD;  Location: [facility] CATH LAB;  Service: Cardiovascular;  Laterality: N/A;  . LITHOTRIPSY  10/2008  . PERCUTANEOUS CORONARY STENT INTERVENTION (PCI-S) N/A 07/09/2011   Procedure: PERCUTANEOUS CORONARY STENT INTERVENTION (PCI-S);  Surgeon: Sherren Mocha, MD;  Location: [facility] CATH LAB;  Service: Cardiovascular;  Laterality: N/A;  . UPPER GI ENDOSCOPY    . URETEROSCOPY  10/2009    Family History  Problem Relation Age of Onset  . Heart attack Father 47       LVAD  . Hypertension Other     SOCIAL HX: reviewed.    Current  Outpatient Medications:  .  amLODipine (NORVASC) 10 MG tablet, Take 1 tablet (10 mg total) by mouth daily., Disp: 90 tablet, Rfl: 3 .  atorvastatin (LIPITOR) 20 MG tablet, Take 1 tablet (20 mg total) by mouth daily., Disp: 90 tablet, Rfl: 3 .  BD HYPODERMIC NEEDLE 16G X 1" MISC, FOR USE WITH TESTOSTERONE INJECTIONS, Disp: 25 each, Rfl: 3 .  CINNAMON PO, Take 1,000 mg by mouth 2 (two) times daily. , Disp: , Rfl:  .  clopidogrel (PLAVIX) 75 MG tablet, Take 1 tablet (75 mg total) by mouth daily., Disp: 90 tablet, Rfl: 3 .  fluticasone (FLONASE) 50 MCG/ACT nasal spray, USE 2 SPRAYS IN EACH NOSTRIL DAILY AS DIRECTED, Disp: 48 g, Rfl: 1 .  glucose blood test strip, Use as instructed to check sugars once daily. Dx E11.9, Disp: 100 each, Rfl: 12 .  hydrochlorothiazide (HYDRODIURIL) 25 MG tablet, Take 1 tablet (25 mg total) by mouth at bedtime., Disp: 90 tablet, Rfl: 3 .  hydrOXYzine (ATARAX/VISTARIL) 50 MG tablet, Take 50 mg by mouth 2 (two) times daily., Disp: , Rfl:  .  olmesartan-hydrochlorothiazide (BENICAR HCT) 40-25 MG tablet, Take 1 tablet by mouth daily., Disp: 90 tablet, Rfl: 3 .  pantoprazole (PROTONIX) 40 MG tablet, TAKE ONE TABLET BY MOUTH DAILY, Disp: 90 tablet, Rfl: 0 .  polyethylene glycol powder (GLYCOLAX/MIRALAX) powder, TAKE 17 GRAMS BY MOUTH EVERY DAY as needed (as directed), Disp: 527 g, Rfl: 1 .  potassium citrate (UROCIT-K) 10 MEQ (1080 MG) SR tablet, Take 1 tablet (10 mEq total) by mouth 2 (two) times daily., Disp: 180 tablet, Rfl: 0 .  Syringe, Disposable, (B-D SYRINGE LUER-LOK 3CC) 3 ML MISC, 1 mL by Does not apply route every 14 (fourteen) days., Disp: 25 each, Rfl: 0 .  SYRINGE-NEEDLE, DISP, 3 ML (B-D 3CC LUER-LOK SYR 22GX1") 22G X 1" 3 ML MISC, FOR TESTOSTERONE INJECTIONS, Disp: 25 each, Rfl: 0 .  tamsulosin (FLOMAX) 0.4 MG CAPS capsule, Take 1 capsule (0.4 mg total) by mouth daily., Disp: 90 capsule, Rfl: 1 .  testosterone cypionate (DEPOTESTOSTERONE CYPIONATE) 200 MG/ML  injection, Inject 0.7 mLs (140 mg total) into the muscle every 7 (seven) days., Disp: 10 mL, Rfl: 0 .  traZODone (DESYREL) 100 MG tablet, Take 200 mg by mouth at bedtime. , Disp: , Rfl:  .  venlafaxine XR (EFFEXOR-XR) 150 MG 24 hr capsule, Take 150 mg by mouth daily with breakfast., Disp: , Rfl:  .  VITAMIN D, CHOLECALCIFEROL, PO, Take 2,000 mg by mouth daily., Disp: , Rfl:  .  VITAMIN E PO, Take 2,000 Units by mouth daily., Disp: , Rfl:  .  zinc gluconate 50 MG tablet, Take 50 mg by mouth daily., Disp: , Rfl:  .  lamoTRIgine (LAMICTAL) 150 MG  tablet, Take 150 mg by mouth daily., Disp: , Rfl:  .  lamoTRIgine (LAMICTAL) 200 MG tablet, Take 200 mg by mouth daily., Disp: , Rfl:  .  Lancets (ONETOUCH ULTRASOFT) lancets, Use as instructed to check sugars once daily. Dx E 11.9, Disp: 100 each, Rfl: 12 .  magnesium oxide (MAG-OX) 400 MG tablet, Take 400 mg by mouth daily., Disp: , Rfl:   EXAM:  GENERAL: alert, oriented, appears well and in no acute distress  HEENT: atraumatic, conjunttiva clear, no obvious abnormalities on inspection of external nose and ears  NECK: normal movements of the head and neck  LUNGS: on inspection no signs of respiratory distress, breathing rate appears normal, no obvious gross SOB, gasping or wheezing  CV: no obvious cyanosis  PSYCH/NEURO: pleasant and cooperative, no obvious depression or anxiety, speech and thought processing grossly intact  ASSESSMENT AND PLAN:  Discussed the following assessment and plan:  Problem List Items Addressed This Visit    Anxiety    Followed by Dr Nicolasa Ducking.  Doing well on current medication regimen.  Follow.       CAD (coronary artery disease)    Has been followed by Dr Rockey Situ.  Describes these recent episodes of feeling like he was going to pass out.  States this feels similar to his symptoms prior to his stent placement 8 years ago.  No chest pain.  Discussed further cardiac w/up and evaluation.  He is agreeable.  Contact Dr  Gollan/cardiology - for further w/up and evaluation.  Informed him if any return or worsening symptoms, he is to be evaluated.  Continue risk factor modificaiton.        Diabetes mellitus with cardiac complication ([place])    He had adjusted his diet.  Is exercising.  Off metformin.  a1c just checked and improved 6.5.  Continue diet and exercise.  Remain off metformin.  Follow met b and a1c.       Relevant Orders   Hemoglobin U2G   Basic metabolic panel   Microalbumin / creatinine urine ratio   GERD (gastroesophageal reflux disease)    No upper symptoms reported.  Continue protonix.       History of colonic polyps    Last colonoscopy 08/2013.  Was due 5 years.  Postponed secondary to covid.  Discussed due.  Will have cardiac evaluation prior.        Hyperlipidemia    Continue lipitor.  Low cholesterol diet and exercise.  Follow lipid panel and liver function tests.  Triglycerides improved from last check.   Lab Results  Component Value Date   CHOL 121 06/05/2020   HDL 28.20 (L) 06/05/2020   LDLCALC 64 09/15/2019   LDLDIRECT 87.0 06/05/2020   TRIG 258.0 (H) 06/05/2020   CHOLHDL 4 06/05/2020        Relevant Orders   Lipid panel   Hepatic function panel   Hypertension    Continue hctz and amlodipine.  Follow pressures.  Follow metabolic panel.       Near syncope    Describes the two episodes as outlined.  States feels similar to symptoms he had prior to stress test and subsequent cath and stent placement - 8 years ago.  No pain.  No active symptoms currently.  Two episodes in the last 6 weeks.  Last 3 weeks ago.  Discussed further cardiac evaluation.  He is in agreement.  Sees Dr Rockey Situ.  Contact - for further w/up and evaluation.       Sleep apnea  Continue cpap.       Thrombocytopenia ([place])    Follow cbc.       Relevant Orders   CBC with Differential/Platelet    Other Visit Diagnoses    Prostate cancer screening    -  Primary   Relevant Orders   PSA       I  discussed the assessment and treatment plan with the patient. The patient was provided an opportunity to ask questions and all were answered. The patient agreed with the plan and demonstrated an understanding of the instructions.   The patient was advised to call back or seek an in-Gross evaluation if the symptoms worsen or if the condition fails to improve as anticipated.    Einar Pheasant, MD

## 2020-06-07 NOTE — Assessment & Plan Note (Signed)
Describes the two episodes as outlined.  States feels similar to symptoms he had prior to stress test and subsequent cath and stent placement - 8 years ago.  No pain.  No active symptoms currently.  Two episodes in the last 6 weeks.  Last 3 weeks ago.  Discussed further cardiac evaluation.  He is in agreement.  Sees Dr Rockey Situ.  Contact - for further w/up and evaluation.

## 2020-06-07 NOTE — Assessment & Plan Note (Signed)
Continue hctz and amlodipine.  Follow pressures.  Follow metabolic panel.  

## 2020-06-07 NOTE — Assessment & Plan Note (Signed)
Last colonoscopy 08/2013.  Was due 5 years.  Postponed secondary to covid.  Discussed due.  Will have cardiac evaluation prior.

## 2020-06-07 NOTE — Assessment & Plan Note (Signed)
Follow cbc.  

## 2020-06-07 NOTE — Assessment & Plan Note (Signed)
No upper symptoms reported.  Continue protonix.  

## 2020-06-07 NOTE — Assessment & Plan Note (Signed)
Followed by Dr Nicolasa Ducking.  Doing well on current medication regimen.  Follow.

## 2020-06-07 NOTE — Assessment & Plan Note (Signed)
Has been followed by Dr Rockey Situ.  Describes these recent episodes of feeling like he was going to pass out.  States this feels similar to his symptoms prior to his stent placement 8 years ago.  No chest pain.  Discussed further cardiac w/up and evaluation.  He is agreeable.  Contact Dr Gollan/cardiology - for further w/up and evaluation.  Informed him if any return or worsening symptoms, he is to be evaluated.  Continue risk factor modificaiton.

## 2020-06-07 NOTE — Assessment & Plan Note (Signed)
Continue cpap.  

## 2020-06-13 DIAGNOSIS — E291 Testicular hypofunction: Secondary | ICD-10-CM | POA: Diagnosis not present

## 2020-06-13 DIAGNOSIS — N401 Enlarged prostate with lower urinary tract symptoms: Secondary | ICD-10-CM | POA: Diagnosis not present

## 2020-06-13 DIAGNOSIS — R3912 Poor urinary stream: Secondary | ICD-10-CM | POA: Diagnosis not present

## 2020-06-15 ENCOUNTER — Telehealth: Payer: Self-pay | Admitting: Cardiovascular Disease

## 2020-06-15 NOTE — Telephone Encounter (Signed)
-----   Message from Minna Merritts, MD sent at 06/14/2020  5:16 PM EDT ----- Regarding: RE: update and f/u I will let our schedulers know, Scheduling can we get him in soon for an appointment thank you Thx TG  ----- Message ----- From: Einar Pheasant, MD Sent: 06/12/2020   5:10 PM EDT To: Minna Merritts, MD Subject: update and f/u                                 I saw Keith Gross for a regular follow up.  He is doing relatively well.  He does describe two episodes of near syncope.  (Two episodes in the last 6 weeks).   States feels similar to symptoms he had prior to stress test and subsequent cath and stent placement - 8 years ago.  No pain.  No active symptoms currently.   Last episode 3 weeks ago.  I wanted you to be aware and he is interested in a follow up.  I can call and get him an appointment.    Hope you are doing well.   Charlene

## 2020-06-15 NOTE — Telephone Encounter (Signed)
Scheduled

## 2020-06-15 NOTE — Telephone Encounter (Signed)
Attempted to schedule.  LMOV to call office.  ° °

## 2020-06-20 NOTE — Progress Notes (Signed)
Patient ID: Keith Gross., male   DOB: 10-14-1955, 65 y.o.   MRN: [id number] Cardiology Office Note  Date:  06/22/2020   ID:  Keith Gross., DOB 07-18-1955, MRN [id number]  PCP:  Einar Pheasant, MD   Chief Complaint  Patient presents with  . Syncope     Patient c/o fatigue & dizziness with feeling like could have a syncopal spell. Medications reviewed by the patient verbally.     HPI:  Mr. Keith Gross is a very pleasant 65 year old gentleman syncope in 2010 felt secondary to low blood pressure that improved with medication changes,  chest pain symptoms at the beginning of April 2013,  stress test showing inferior lateral hypoperfusion consistent with large region of scar which was new from 2010,  cardiac catheterization showing severe ramus disease, with DES stent placed in May 2013. catheterization September 2013 for chest pain showing patent stent, distal left circumflex disease noted, moderate in nature. He presents today for follow-up of his coronary artery disease  In follow-up today reports several months ago he had a episode where he " fell out" Had got up from a chair, acutely lightheaded, went down onto the ground Was diaphoretic, took some time to recover "felt like before prior stent"  Reports has had significant weight loss over the past several years, changed his diet Off metformin We estimate down 12 to 16 pounds over the past several years  Continues to exercise Lab Results  Component Value Date   CHOL 121 06/05/2020   HDL 28.20 (L) 06/05/2020   LDLCALC 64 09/15/2019   TRIG 258.0 (H) 06/05/2020    EKG personally reviewed by myself on todays visit Shows normal sinus rhythm rate 76 bpm no significant ST or T wave changes, right bundle branch block  Other past medical history reviewed  CPAP for sleep apnea   previous history of smoking, quit in 1992.  Father had his first heart attack at age 76.  PMH:   has a past medical history of Acid reflux, Angina,  Anxiety, Arthritis, Diastasis of muscle (2011), Dysplastic polyp of colon (2008), GERD (gastroesophageal reflux disease), Gout, Heart disease, Hyperlipidemia, Hypertension, Kidney stones, Myocardial infarction ([place]) (05/2011), Sleep apnea, Syncopal episodes, and Treadmill stress test negative for angina pectoris (2011).  PSH:    Past Surgical History:  Procedure Laterality Date  . CARDIAC CATHETERIZATION  06/2011   "diagnostic"  . COLONOSCOPY    . CORONARY ANGIOPLASTY WITH STENT PLACEMENT  07/09/11   "1"  . HEMORRHOID SURGERY  2000  . [place]   left  . LEFT HEART CATHETERIZATION WITH CORONARY ANGIOGRAM N/A 10/21/2011   Procedure: LEFT HEART CATHETERIZATION WITH CORONARY ANGIOGRAM;  Surgeon: Hillary Bow, MD;  Location: [facility] CATH LAB;  Service: Cardiovascular;  Laterality: N/A;  . LITHOTRIPSY  10/2008  . PERCUTANEOUS CORONARY STENT INTERVENTION (PCI-S) N/A 07/09/2011   Procedure: PERCUTANEOUS CORONARY STENT INTERVENTION (PCI-S);  Surgeon: Sherren Mocha, MD;  Location: [facility] CATH LAB;  Service: Cardiovascular;  Laterality: N/A;  . UPPER GI ENDOSCOPY    . URETEROSCOPY  10/2009    Current Outpatient Medications  Medication Sig Dispense Refill  . atorvastatin (LIPITOR) 20 MG tablet Take 1 tablet (20 mg total) by mouth daily. 90 tablet 3  . BD HYPODERMIC NEEDLE 16G X 1" MISC FOR USE WITH TESTOSTERONE INJECTIONS 25 each 3  . BD HYPODERMIC NEEDLE 18G X 1" MISC USE WITH TESTOSTERONE INJECTIONS    . CINNAMON PO Take 1,000 mg by mouth 2 (  two) times daily.     . clopidogrel (PLAVIX) 75 MG tablet Take 1 tablet (75 mg total) by mouth daily. 90 tablet 3  . fluticasone (FLONASE) 50 MCG/ACT nasal spray USE 2 SPRAYS IN EACH NOSTRIL DAILY AS DIRECTED 48 g 1  . glucose blood test strip Use as instructed to check sugars once daily. Dx E11.9 100 each 12  . hydrOXYzine (ATARAX/VISTARIL) 50 MG tablet Take 50 mg by mouth 2 (two) times daily.    Marland Kitchen lamoTRIgine (LAMICTAL) 150 MG tablet Take 150 mg  by mouth daily.    Marland Kitchen lamoTRIgine (LAMICTAL) 200 MG tablet Take 200 mg by mouth daily.    . Lancets (ONETOUCH ULTRASOFT) lancets Use as instructed to check sugars once daily. Dx E 11.9 100 each 12  . magnesium oxide (MAG-OX) 400 MG tablet Take 400 mg by mouth daily.    . pantoprazole (PROTONIX) 40 MG tablet TAKE ONE TABLET BY MOUTH DAILY 90 tablet 0  . polyethylene glycol powder (GLYCOLAX/MIRALAX) powder TAKE 17 GRAMS BY MOUTH EVERY DAY as needed (as directed) 527 g 1  . potassium citrate (UROCIT-K) 10 MEQ (1080 MG) SR tablet Take 1 tablet (10 mEq total) by mouth 2 (two) times daily. 180 tablet 0  . Syringe, Disposable, (B-D SYRINGE LUER-LOK 3CC) 3 ML MISC 1 mL by Does not apply route every 14 (fourteen) days. 25 each 0  . SYRINGE-NEEDLE, DISP, 3 ML (B-D 3CC LUER-LOK SYR 22GX1") 22G X 1" 3 ML MISC FOR TESTOSTERONE INJECTIONS 25 each 0  . tamsulosin (FLOMAX) 0.4 MG CAPS capsule Take 1 capsule (0.4 mg total) by mouth daily. 90 capsule 1  . testosterone cypionate (DEPOTESTOSTERONE CYPIONATE) 200 MG/ML injection Inject 0.7 mLs (140 mg total) into the muscle every 7 (seven) days. 10 mL 0  . traZODone (DESYREL) 100 MG tablet Take 200 mg by mouth at bedtime.     Marland Kitchen venlafaxine XR (EFFEXOR-XR) 150 MG 24 hr capsule Take 150 mg by mouth daily with breakfast.    . VITAMIN D, CHOLECALCIFEROL, PO Take 2,000 mg by mouth daily.    Marland Kitchen VITAMIN E PO Take 2,000 Units by mouth daily.    Marland Kitchen zinc gluconate 50 MG tablet Take 50 mg by mouth daily.    . hydrochlorothiazide (HYDRODIURIL) 25 MG tablet Take 1 tablet (25 mg total) by mouth daily as needed. 90 tablet 0  . olmesartan-hydrochlorothiazide (BENICAR HCT) 40-25 MG tablet Take 1 tablet by mouth at bedtime. 90 tablet 0   No current facility-administered medications for this visit.    Allergies:   Patient has no known allergies.   Social History:  The patient  reports that he quit smoking about 30 years ago. His smoking use included cigarettes. He has a 10.00  pack-year smoking history. He has never used smokeless tobacco. He reports previous alcohol use of about 4.0 standard drinks of alcohol per week. He reports current drug use. Frequency: 7.00 times per week. Drug: Marijuana.   Family History:   family history includes Heart attack (age of onset: 80) in his father; Hypertension in an other family member.    Review of Systems: Review of Systems  Constitutional: Negative.   HENT: Negative.   Respiratory: Negative.   Cardiovascular: Negative.   Gastrointestinal: Negative.   Musculoskeletal: Negative.   Neurological: Negative.   Psychiatric/Behavioral: Negative.   All other systems reviewed and are negative.   PHYSICAL EXAM: VS:  BP 122/80 (BP Location: Left Arm, Patient Position: Sitting, Cuff Size: Normal)   Pulse 83  Ht 6' (1.829 m)   Wt 215 lb (97.5 kg)   SpO2 98%   BMI 29.16 kg/m  , BMI Body mass index is 29.16 kg/m. Constitutional:  oriented to person, place, and time. No distress.  HENT:  Head: Grossly normal Eyes:  no discharge. No scleral icterus.  Neck: No JVD, no carotid bruits  Cardiovascular: Regular rate and rhythm, no murmurs appreciated Pulmonary/Chest: Clear to auscultation bilaterally, no wheezes or rails Abdominal: Soft.  no distension.  no tenderness.  Musculoskeletal: Normal range of motion Neurological:  normal muscle tone. Coordination normal. No atrophy Skin: Skin warm and dry Psychiatric: normal affect, pleasant  Recent Labs: 01/31/2020: Hemoglobin 16.4; Platelets 166.0 06/05/2020: ALT 23; BUN 23; Creatinine, Ser 1.26; Potassium 4.1; Sodium 138; TSH 2.79    Lipid Panel Lab Results  Component Value Date   CHOL 121 06/05/2020   HDL 28.20 (L) 06/05/2020   LDLCALC 64 09/15/2019   TRIG 258.0 (H) 06/05/2020      Wt Readings from Last 3 Encounters:  06/22/20 215 lb (97.5 kg)  06/07/20 215 lb (97.5 kg)  02/02/20 216 lb (98 kg)     ASSESSMENT AND PLAN:  Coronary artery disease due to calcified  coronary lesion - Currently with no symptoms of angina. No further workup at this time. Continue current medication regimen.  Near syncope Suspect secondary to orthostasis Recent weight loss, orthostatic on today's office visit with 25 point drop in blood pressure supine to standing 664 systolic down to 403 Recommend he move the olmesartan HCTZ to evening The other HCTZ he typically takes, would make this as needed for systolic pressure over 474  Essential hypertension - Plan: EKG 12-Lead Medication changes as below Likes to take his HCTZ in the evening.  Take the olmesartan HCTZ in the evening Recommend he make the HCTZ in the morning as needed for systolic pressure over 259  Hyperlipidemia Cholesterol is at goal on the current lipid regimen. No changes to the medications were made.  Borderline diabetes A1c well controlled, weight trending downward  Anxiety Well controlled, exercising    Total encounter time more than 25 minutes  Greater than 50% was spent in counseling and coordination of care with the patient     Orders Placed This Encounter  Procedures  . EKG 12-Lead     Signed, Esmond Plants, M.D., Ph.D. 06/22/2020  [facility] [place], [place] [phone]

## 2020-06-21 DIAGNOSIS — G4700 Insomnia, unspecified: Secondary | ICD-10-CM | POA: Diagnosis not present

## 2020-06-21 DIAGNOSIS — F3181 Bipolar II disorder: Secondary | ICD-10-CM | POA: Diagnosis not present

## 2020-06-21 DIAGNOSIS — F1220 Cannabis dependence, uncomplicated: Secondary | ICD-10-CM | POA: Diagnosis not present

## 2020-06-22 ENCOUNTER — Encounter: Payer: Self-pay | Admitting: Cardiovascular Disease

## 2020-06-22 ENCOUNTER — Other Ambulatory Visit: Payer: Self-pay

## 2020-06-22 ENCOUNTER — Ambulatory Visit: Payer: BC Managed Care – PPO | Admitting: Cardiovascular Disease

## 2020-06-22 VITALS — BP 122/80 | HR 83 | Ht 72.0 in | Wt 215.0 lb

## 2020-06-22 DIAGNOSIS — I10 Essential (primary) hypertension: Secondary | ICD-10-CM | POA: Diagnosis not present

## 2020-06-22 DIAGNOSIS — E1159 Type 2 diabetes mellitus with other circulatory complications: Secondary | ICD-10-CM | POA: Diagnosis not present

## 2020-06-22 DIAGNOSIS — E782 Mixed hyperlipidemia: Secondary | ICD-10-CM

## 2020-06-22 DIAGNOSIS — I25118 Atherosclerotic heart disease of native coronary artery with other forms of angina pectoris: Secondary | ICD-10-CM | POA: Diagnosis not present

## 2020-06-22 DIAGNOSIS — E291 Testicular hypofunction: Secondary | ICD-10-CM | POA: Diagnosis not present

## 2020-06-22 MED ORDER — OLMESARTAN MEDOXOMIL-HCTZ 40-25 MG PO TABS: 1.0000 | ORAL_TABLET | Freq: Every day | ORAL | 0 refills | Status: DC

## 2020-06-22 MED ORDER — HYDROCHLOROTHIAZIDE 25 MG PO TABS: 25.0000 mg | ORAL_TABLET | Freq: Every day | ORAL | 0 refills | Status: DC | PRN

## 2020-06-22 NOTE — Patient Instructions (Addendum)
   Medication Instructions:  Take the olmesartan HCTZ in the pm/QHS (before bed)  Other HCTZ with potassium in the Am as needed (prn) for pressure >150  If you need a refill on your cardiac medications before your next appointment, please call your pharmacy.    Lab work: No new labs needed   If you have labs (blood work) drawn today and your tests are completely normal, you will receive your results only by: Marland Kitchen MyChart Message (if you have MyChart) OR . A paper copy in the mail If you have any lab test that is abnormal or we need to change your treatment, we will call you to review the results.   Testing/Procedures: No new testing needed   Follow-Up: At [facility] you and your health needs are our priority.  As part of our continuing mission to provide you with exceptional heart care, we have created designated Provider Care Teams.  These Care Teams include your primary Cardiologist (physician) and Advanced Practice Providers (APPs -  Physician Assistants and Nurse Practitioners) who all work together to provide you with the care you need, when you need it.  . You will need a follow up appointment in 12 months  . Providers on your designated Care Team:   . Murray Hodgkins, NP . Christell Faith, PA-C . Marrianne Mood, PA-C  Any Other Special Instructions Will Be Listed Below (If Applicable).  COVID-19 Vaccine Information can be found at: [email or web address] For questions related to vaccine distribution or appointments, please email [email or web address] or call [phone].

## 2020-07-01 ENCOUNTER — Other Ambulatory Visit: Payer: Self-pay | Admitting: Internal Medicine

## 2020-07-06 ENCOUNTER — Other Ambulatory Visit: Payer: Self-pay | Admitting: *Deleted

## 2020-07-06 NOTE — Progress Notes (Addendum)
[facility] Note  07/11/2020     CHIEF COMPLAINT Patient presents for Retina Evaluation   HISTORY OF PRESENT ILLNESS: Keith Mcnelly. is a 65 y.o. male who presents to the clinic today for:   HPI    Retina Evaluation    In right eye.  Associated Symptoms Blind Spot.  I, the attending physician,  performed the HPI with the patient and updated documentation appropriately.          Comments    Retina eval per Dr. Ellin Mayhew for macula hole OD. Vision in OD is "about gone" x6-8 months.  Seeing wavy lines OD.          Last edited by Bernarda Caffey, MD on 07/11/2020  5:18 PM. (History)    pt is here on the referral of Dr. Ellin Mayhew for concern of macular hole OD, pt states about 6-8 months ago he noticed gradual blurriness of the right eye, pt had not had an eye exam for 2 years, but saw Dr. Ellin Mayhew about 4-5 months ago, pt states he procrastinated in coming here, pt states straight lines are wavy, he states his central vision "comes and goes", pt endorses floaters in the right eye, pt had lasik 25 years ago, no family hx of eye problems, pt denies being diabetic, he states he has a heart stent and takes medication for BP and cholesterol  Referring physician: Dwain Sarna, [place], [place] [id number]  HISTORICAL INFORMATION:   Selected notes from the MEDICAL RECORD NUMBER Referred by Dr. Dwain Sarna LEE: 03/20/2020 BCVA OD: 20/30 OS: 20/20 Ocular Hx- Myopia, Macular Hole OD, DM without retinopathy PMH- DM    CURRENT MEDICATIONS: No current outpatient medications on file. (Ophthalmic Drugs)   No current facility-administered medications for this visit. (Ophthalmic Drugs)   Current Outpatient Medications (Other)  Medication Sig  . atorvastatin (LIPITOR) 20 MG tablet Take 1 tablet (20 mg total) by mouth daily.  Marland Kitchen CINNAMON PO Take 1,000 mg by mouth 2 (two) times daily.   . clopidogrel (PLAVIX) 75 MG tablet Take 1 tablet (75 mg total)  by mouth daily.  . fluticasone (FLONASE) 50 MCG/ACT nasal spray USE 2 SPRAYS IN EACH NOSTRIL DAILY AS DIRECTED  . hydrochlorothiazide (HYDRODIURIL) 25 MG tablet Take 1 tablet (25 mg total) by mouth daily as needed.  . hydrOXYzine (ATARAX/VISTARIL) 50 MG tablet Take 50 mg by mouth 2 (two) times daily.  Marland Kitchen lamoTRIgine (LAMICTAL) 150 MG tablet Take 150 mg by mouth daily.  Marland Kitchen lamoTRIgine (LAMICTAL) 200 MG tablet Take 200 mg by mouth daily.  . magnesium oxide (MAG-OX) 400 MG tablet Take 400 mg by mouth daily.  Marland Kitchen olmesartan-hydrochlorothiazide (BENICAR HCT) 40-25 MG tablet Take 1 tablet by mouth at bedtime.  . pantoprazole (PROTONIX) 40 MG tablet TAKE ONE TABLET BY MOUTH ONE TIME DAILY  . polyethylene glycol powder (GLYCOLAX/MIRALAX) powder TAKE 17 GRAMS BY MOUTH EVERY DAY as needed (as directed)  . potassium citrate (UROCIT-K) 10 MEQ (1080 MG) SR tablet Take 1 tablet (10 mEq total) by mouth 2 (two) times daily.  . tamsulosin (FLOMAX) 0.4 MG CAPS capsule Take 1 capsule (0.4 mg total) by mouth daily.  . traZODone (DESYREL) 100 MG tablet Take 200 mg by mouth at bedtime.   Marland Kitchen venlafaxine XR (EFFEXOR-XR) 150 MG 24 hr capsule Take 150 mg by mouth daily with breakfast.  . VITAMIN D, CHOLECALCIFEROL, PO Take 2,000 mg by mouth daily.  Marland Kitchen VITAMIN E PO  Take 2,000 Units by mouth daily.  Marland Kitchen zinc gluconate 50 MG tablet Take 50 mg by mouth daily.  . BD HYPODERMIC NEEDLE 16G X 1" MISC FOR USE WITH TESTOSTERONE INJECTIONS  . BD HYPODERMIC NEEDLE 18G X 1" MISC USE WITH TESTOSTERONE INJECTIONS  . glucose blood test strip Use as instructed to check sugars once daily. Dx E11.9  . Lancets (ONETOUCH ULTRASOFT) lancets Use as instructed to check sugars once daily. Dx E 11.9  . Syringe, Disposable, (B-D SYRINGE LUER-LOK 3CC) 3 ML MISC 1 mL by Does not apply route every 14 (fourteen) days.  . SYRINGE-NEEDLE, DISP, 3 ML (B-D 3CC LUER-LOK SYR 22GX1") 22G X 1" 3 ML MISC FOR TESTOSTERONE INJECTIONS  . testosterone cypionate  (DEPOTESTOSTERONE CYPIONATE) 200 MG/ML injection Inject 0.7 mLs (140 mg total) into the muscle every 7 (seven) days.   No current facility-administered medications for this visit. (Other)      REVIEW OF SYSTEMS: ROS    Positive for: Gastrointestinal, Genitourinary, Musculoskeletal, Cardiovascular, Eyes, Respiratory, Psychiatric   Negative for: Constitutional, Neurological, Skin, HENT, Endocrine, Allergic/Imm, Heme/Lymph   Last edited by Leonie Douglas, COA on 07/11/2020  8:40 AM. (History)       ALLERGIES No Known Allergies  PAST MEDICAL HISTORY Past Medical History:  Diagnosis Date  . Acid reflux   . Angina   . Anxiety   . Arthritis   . Diastasis of muscle 2011   "abdominal"  . Dysplastic polyp of colon 2008   [facility]  . GERD (gastroesophageal reflux disease)   . Gout    Multiple episodes  . Heart disease   . Hyperlipidemia   . Hypertension   . Kidney stones   . Myocardial infarction [facility]) 05/2011   "we think";  06/2011 abnl mv;  06/2011 Cath - subtotal occlusion of Ramus, otw nonobs dzs;  06/2011 PCI/DES of Ramus w/ 2.25x74mm Resolute DES   . Sleep apnea    on CPAP  . Syncopal episodes   . Treadmill stress test negative for angina pectoris 2011   Past Surgical History:  Procedure Laterality Date  . CARDIAC CATHETERIZATION  06/2011   "diagnostic"  . COLONOSCOPY    . CORONARY ANGIOPLASTY WITH STENT PLACEMENT  07/09/11   "1"  . HEMORRHOID SURGERY  2000  . [place]   left  . LEFT HEART CATHETERIZATION WITH CORONARY ANGIOGRAM N/A 10/21/2011   Procedure: LEFT HEART CATHETERIZATION WITH CORONARY ANGIOGRAM;  Surgeon: Hillary Bow, MD;  Location: [facility] CATH LAB;  Service: Cardiovascular;  Laterality: N/A;  . LITHOTRIPSY  10/2008  . PERCUTANEOUS CORONARY STENT INTERVENTION (PCI-S) N/A 07/09/2011   Procedure: PERCUTANEOUS CORONARY STENT INTERVENTION (PCI-S);  Surgeon: Sherren Mocha, MD;  Location: [facility] CATH LAB;  Service: Cardiovascular;  Laterality: N/A;  .  UPPER GI ENDOSCOPY    . URETEROSCOPY  10/2009    FAMILY HISTORY Family History  Problem Relation Age of Onset  . Heart attack Father 81       LVAD  . Hypertension Other     SOCIAL HISTORY Social History   Tobacco Use  . Smoking status: Former Smoker    Packs/day: 0.50    Years: 20.00    Pack years: 10.00    Types: Cigarettes    Quit date: 02/17/1990    Years since quitting: 30.4  . Smokeless tobacco: Never Used  Vaping Use  . Vaping Use: Never used  Substance Use Topics  . Alcohol use: Not Currently    Alcohol/week: 4.0 standard drinks  Types: 2 Glasses of wine, 2 Cans of beer per week  . Drug use: Yes    Frequency: 7.0 times per week    Types: Marijuana    Comment: 07/09/11 "lets say a cigarette a day"         OPHTHALMIC EXAM:  Base Eye Exam    Visual Acuity (Snellen - Linear)      Right Left   Dist cc 20/50 -2 20/25 -2   Dist ph cc NI NI   Correction: Glasses  OD- letters are distorted       Tonometry (Tonopen, 8:54 AM)      Right Left   Pressure 14 12       Pupils      Dark Light Shape React APD   Right 2 1 Round Brisk None   Left 2 1 Round Brisk None       Visual Fields (Counting fingers)      Left Right    Full Full       Extraocular Movement      Right Left    Full Full       Neuro/Psych    Oriented x3: Yes   Mood/Affect: Normal       Dilation    Both eyes: 1.0% Mydriacyl, 2.5% Phenylephrine @ 8:54 AM        Slit Lamp and Fundus Exam    External Exam      Right Left   External Normal Normal       Slit Lamp Exam      Right Left   Lids/Lashes Dermatochalasis - upper lid Dermatochalasis - upper lid   Conjunctiva/Sclera White and quiet White and quiet   Cornea Mild arcus, trace PEE Mild arcus, trace PEE   Anterior Chamber Deep and quiet Deep and quiet   Iris Round and dilated Round and dilated   Lens 2+ Nuclear sclerosis, 2+ Cortical cataract 2+ Nuclear sclerosis, 2+ Cortical cataract   Vitreous Vitreous syneresis,  Posterior vitreous detachment Vitreous syneresis, Posterior vitreous detachment       Fundus Exam      Right Left   Disc Pink and Sharp, mild tilt, temporal PPA Pink and Sharp, +PPP   C/D Ratio 0.3 0.4   Macula FTMH with mild surrounding CME, no heme Flat, Blunted foveal reflex, +Cystic changes, mild ERM, no heme   Vessels mild attenuation, mild tortuousity mild attenuation, mild tortuousity   Periphery Attached, No heme  Attached           Refraction    Wearing Rx      Sphere Cylinder Axis Add   Right -1.25 +1.00 125 +2.50   Left -1.75 +1.00 155 +2.50       Manifest Refraction      Sphere Cylinder Axis Dist VA   Right -1.25 +1.00 125 NI   Left -2.00 +1.00 155 NI          IMAGING AND PROCEDURES  Imaging and Procedures for 07/11/2020  OCT, Retina - OU - Both Eyes       Right Eye Quality was good. Central Foveal Thickness: 353. Progression has no prior data. Findings include abnormal foveal contour, intraretinal fluid, no SRF, macular hole.   Left Eye Quality was good. Central Foveal Thickness: 334. Progression has no prior data. Findings include abnormal foveal contour, intraretinal fluid, no SRF, epiretinal membrane (Foveal notch with central cystic changes).   Notes *Images captured and stored on drive  Diagnosis / Impression:  OD:  [place] OS: ERM with central cystic changes  Clinical management:  See below  Abbreviations: NFP - Normal foveal profile. CME - cystoid macular edema. PED - pigment epithelial detachment. IRF - intraretinal fluid. SRF - subretinal fluid. EZ - ellipsoid zone. ERM - epiretinal membrane. ORA - outer retinal atrophy. ORT - outer retinal tubulation. SRHM - subretinal hyper-reflective material. IRHM - intraretinal hyper-reflective material        Fluorescein Angiography Optos (Transit OS)       Right Eye   Progression has no prior data. Early phase findings include staining, window defect. Mid/Late phase findings include staining,  window defect (Staining / window defect of central macular hole).   Left Eye   Progression has no prior data. Early phase findings include normal observations (?mild focal telangectasia / MA temporal to fovea). Mid/Late phase findings include normal observations (No leakage, no focal telangectasia / MA temporal to fovea).   Notes **Images stored on drive**  Impression: OD: staining / window defect of central macular hole OS: no leakage or staining; no CME or hyperfluorescence of disc                ASSESSMENT/PLAN:    ICD-10-CM   1. Macular hole of right eye  H35.341   2. Retinal edema  H35.81 OCT, Retina - OU - Both Eyes  3. Essential hypertension  I10   4. Hypertensive retinopathy of both eyes  H35.033 Fluorescein Angiography Optos (Transit OS)  5. Combined forms of age-related cataract of both eyes  H25.813     1,2. Macular hole, OD  - full thickness macular hole with mild surrounding CME  - initially diagnosed and referred here in Feb 2022  - BCVA 20/50 OD - The natural history, anatomy, potential for loss of vision, and treatment options including vitrectomy techniques and the complications of endophthalmitis, retinal detachment, vitreous hemorrhage, cataract progression and permanent vision loss discussed with the patient. - recommend PPV w/ membrane peel and gas under general anesthesia - pt wishes to proceed with surgery on July 19, 2020 - RBA of procedure discussed, questions answered - informed consent obtained and signed - f/u Friday, 6.3.22 -- POV1  3,4. Hypertensive retinopathy OU - discussed importance of tight BP control - monitor  5. Mixed Cataract OU - The symptoms of cataract, surgical options, and treatments and risks were discussed with patient. - discussed diagnosis and progression, specifically discussed likelihood of cataract progression post macular hole repair - monitor  Ophthalmic Meds Ordered this visit:  No orders of the defined types  were placed in this encounter.     Return in about 9 days (around 07/20/2020) for POV.  There are no Patient Instructions on file for this visit.   Explained the diagnoses, plan, and follow up with the patient and they expressed understanding.  Patient expressed understanding of the importance of proper follow up care.   This document serves as a record of services personally performed by Gardiner Sleeper, MD, PhD. It was created on their behalf by Roselee Nova, COMT. The creation of this record is the provider's dictation and/or activities during the visit.  Electronically signed by: Roselee Nova, COMT 07/11/20 5:30 PM   This document serves as a record of services personally performed by Gardiner Sleeper, MD, PhD. It was created on their behalf by San Jetty. Owens Shark, OA an ophthalmic technician. The creation of this record is the provider's dictation and/or activities during the visit.    Electronically signed by: San Jetty.  Owens Shark, [place] 05.25.2022 5:30 PM  Gardiner Sleeper, M.D., Ph.D. Diseases & Surgery of the Retina and Vitreous [facility]  I have reviewed the above documentation for accuracy and completeness, and I agree with the above. Gardiner Sleeper, M.D., Ph.D. 07/11/20 5:30 PM  Abbreviations: M myopia (nearsighted); A astigmatism; H hyperopia (farsighted); P presbyopia; Mrx spectacle prescription;  CTL contact lenses; OD right eye; OS left eye; OU both eyes  XT exotropia; ET esotropia; PEK punctate epithelial keratitis; PEE punctate epithelial erosions; DES dry eye syndrome; MGD meibomian gland dysfunction; ATs artificial tears; PFAT's preservative free artificial tears; [place] nuclear sclerotic cataract; PSC posterior subcapsular cataract; ERM epi-retinal membrane; PVD posterior vitreous detachment; RD retinal detachment; DM diabetes mellitus; DR diabetic retinopathy; NPDR non-proliferative diabetic retinopathy; PDR proliferative diabetic retinopathy; CSME clinically  significant macular edema; DME diabetic macular edema; dbh dot blot hemorrhages; CWS cotton wool spot; POAG primary open angle glaucoma; C/D cup-to-disc ratio; HVF humphrey visual field; GVF goldmann visual field; OCT optical coherence tomography; IOP intraocular pressure; BRVO Branch retinal vein occlusion; CRVO central retinal vein occlusion; CRAO central retinal artery occlusion; BRAO branch retinal artery occlusion; RT retinal tear; SB scleral buckle; PPV pars plana vitrectomy; VH Vitreous hemorrhage; PRP panretinal laser photocoagulation; IVK intravitreal kenalog; VMT vitreomacular traction; MH Macular hole;  NVD neovascularization of the disc; NVE neovascularization elsewhere; AREDS age related eye disease study; ARMD age related macular degeneration; POAG primary open angle glaucoma; EBMD epithelial/anterior basement membrane dystrophy; ACIOL anterior chamber intraocular lens; IOL intraocular lens; PCIOL posterior chamber intraocular lens; Phaco/IOL phacoemulsification with intraocular lens placement; [place] photorefractive keratectomy; LASIK laser assisted in situ keratomileusis; HTN hypertension; DM diabetes mellitus; COPD chronic obstructive pulmonary disease

## 2020-07-09 ENCOUNTER — Other Ambulatory Visit: Payer: Self-pay

## 2020-07-11 ENCOUNTER — Ambulatory Visit (INDEPENDENT_AMBULATORY_CARE_PROVIDER_SITE_OTHER): Payer: BC Managed Care – PPO | Admitting: Ophthalmology

## 2020-07-11 ENCOUNTER — Other Ambulatory Visit: Payer: Self-pay

## 2020-07-11 ENCOUNTER — Encounter (INDEPENDENT_AMBULATORY_CARE_PROVIDER_SITE_OTHER): Payer: Self-pay | Admitting: Ophthalmology

## 2020-07-11 DIAGNOSIS — I10 Essential (primary) hypertension: Secondary | ICD-10-CM | POA: Diagnosis not present

## 2020-07-11 DIAGNOSIS — H35033 Hypertensive retinopathy, bilateral: Secondary | ICD-10-CM

## 2020-07-11 DIAGNOSIS — H35341 Macular cyst, hole, or pseudohole, right eye: Secondary | ICD-10-CM | POA: Diagnosis not present

## 2020-07-11 DIAGNOSIS — H25813 Combined forms of age-related cataract, bilateral: Secondary | ICD-10-CM

## 2020-07-11 DIAGNOSIS — H3581 Retinal edema: Secondary | ICD-10-CM

## 2020-07-11 MED ORDER — TESTOSTERONE CYPIONATE 200 MG/ML IM SOLN: 140.0000 mg | INTRAMUSCULAR | 0 refills | Status: AC

## 2020-07-12 ENCOUNTER — Telehealth: Payer: Self-pay | Admitting: Internal Medicine

## 2020-07-12 NOTE — Telephone Encounter (Signed)
Patient aware of below.

## 2020-07-12 NOTE — Telephone Encounter (Signed)
Pt called returning your call 

## 2020-07-12 NOTE — Telephone Encounter (Signed)
LMTCB

## 2020-07-12 NOTE — Telephone Encounter (Signed)
Please call Keith Gross and inform him that I received notification that he is planning to have eye surgery 07/19/20.  Notify him that he needs to call cardiology and make them aware of upcoming surgery  - given history of heart disease.  He sees Dr Rockey Situ.  They need to be aware and given any pre op recommendations.  Let me know if any problems and let me know if any other issues we need to address prior to his surgery.

## 2020-07-17 NOTE — H&P (Signed)
Keith Docter. is an 65 y.o. male.    Chief Complaint: macular hole, right eye  HPI: Pt with 4+ mo history of decreased central vision OD. On dilated exam, was found to have a full thickness macular hole OD. After a discussion of the risks, benefits, and alternatives to surgery, the patient has elected to proceed with surgery for repair of the full thickness macular hole -- 25g PPV w/ membrane peel and gas, OD, under general anesthesia.   Past Medical History:  Diagnosis Date  . Acid reflux   . Angina   . Anxiety   . Arthritis   . Diastasis of muscle 2011   "abdominal"  . Dysplastic polyp of colon 2008   [facility]  . GERD (gastroesophageal reflux disease)   . Gout    Multiple episodes  . Heart disease   . Hyperlipidemia   . Hypertension   . Kidney stones   . Myocardial infarction [facility]) 05/2011   "we think";  06/2011 abnl mv;  06/2011 Cath - subtotal occlusion of Ramus, otw nonobs dzs;  06/2011 PCI/DES of Ramus w/ 2.25x54mm Resolute DES   . Sleep apnea    on CPAP  . Syncopal episodes   . Treadmill stress test negative for angina pectoris 2011    Past Surgical History:  Procedure Laterality Date  . CARDIAC CATHETERIZATION  06/2011   "diagnostic"  . COLONOSCOPY    . CORONARY ANGIOPLASTY WITH STENT PLACEMENT  07/09/11   "1"  . HEMORRHOID SURGERY  2000  . [place]   left  . LEFT HEART CATHETERIZATION WITH CORONARY ANGIOGRAM N/A 10/21/2011   Procedure: LEFT HEART CATHETERIZATION WITH CORONARY ANGIOGRAM;  Surgeon: Hillary Bow, MD;  Location: [facility] CATH LAB;  Service: Cardiovascular;  Laterality: N/A;  . LITHOTRIPSY  10/2008  . PERCUTANEOUS CORONARY STENT INTERVENTION (PCI-S) N/A 07/09/2011   Procedure: PERCUTANEOUS CORONARY STENT INTERVENTION (PCI-S);  Surgeon: Sherren Mocha, MD;  Location: [facility] CATH LAB;  Service: Cardiovascular;  Laterality: N/A;  . UPPER GI ENDOSCOPY    . URETEROSCOPY  10/2009    Family History  Problem Relation Age of Onset  . Heart attack  Father 11       LVAD  . Hypertension Other    Social History:  reports that he quit smoking about 30 years ago. His smoking use included cigarettes. He has a 10.00 pack-year smoking history. He has never used smokeless tobacco. He reports previous alcohol use of about 4.0 standard drinks of alcohol per week. He reports current drug use. Frequency: 7.00 times per week. Drug: Marijuana.  Allergies: No Known Allergies  No medications prior to admission.    Review of systems otherwise negative  There were no vitals taken for this visit.  Physical exam: Mental status: oriented x3. Eyes: See eye exam associated with this date of surgery Ears, Nose, Throat: within normal limits Neck: Within Normal limits General: within normal limits Chest: Within normal limits Breast: deferred Heart: Within normal limits Abdomen: Within normal limits GU: deferred Extremities: within normal limits Skin: within normal limits  Assessment/Plan 1. Full thickness macular hole, RIGHT EYE Plan: To [facility] for 25g PPV w/ membrane peel and gas, RIGHT EYE, under general anesthesia - case scheduled for Thursday, 6.2.22, [facility] OR 08  Gardiner Sleeper, M.D., Ph.D. Vitreoretinal Surgeon [facility]

## 2020-07-18 ENCOUNTER — Encounter (HOSPITAL_COMMUNITY): Payer: Self-pay | Admitting: Ophthalmology

## 2020-07-18 ENCOUNTER — Other Ambulatory Visit: Payer: Self-pay

## 2020-07-18 NOTE — Progress Notes (Signed)
DUE TO COVID-19 ONLY ONE VISITOR IS ALLOWED TO COME WITH YOU AND STAY IN THE WAITING ROOM ONLY DURING PRE OP AND PROCEDURE DAY OF SURGERY.  PCP - Dr Einar Pheasant Cardiologist - Dr Ida Rogue Urology - Dr Nicki Reaper [facility]  Chest x-ray - n/a EKG - 06/22/20 Stress Test - 03/29/13 ECHO - 11/06/08 Cardiac Cath - 10/21/11  Sleep Study -  Yes CPAP - uses cpap nightly  Fasting Blood Sugar - unknown Checks Blood Sugar 0  times a day  . Do not take metformin on the morning of surgery.    Blood Thinner Instructions:  Last dose of plavix was on 07/18/20.  Called Dr Dairl Ponder office to see if ok to proceed with 07/19/20 surgery.  Per Estill Bamberg at MD's office, ok to proceed with 07/19/20 surgery. Patient informed not to take plavix on DOS.   Anesthesia review: Yes  STOP now taking any Aspirin (unless otherwise instructed by your surgeon), Aleve, Naproxen, Ibuprofen, Motrin, Advil, Goody's, BC's, all herbal medications, fish oil, and all vitamins.   Coronavirus Screening Covid test n/a - Ambulatory Surgery  Do you have any of the following symptoms:  Cough yes/no: No Fever (>100.72F)  yes/no: No Runny nose yes/no: No Sore throat yes/no: No Difficulty breathing/shortness of breath  yes/no: No  Have you traveled in the last 14 days and where? Yes, [place].  Patient verbalized understanding of instructions that were given via phone.

## 2020-07-19 ENCOUNTER — Ambulatory Visit (HOSPITAL_COMMUNITY)
Admission: RE | Admit: 2020-07-19 | Discharge: 2020-07-19 | Disposition: A | Discharge status: Home / Self Care | Payer: BC Managed Care – PPO | Attending: Ophthalmology | Admitting: Ophthalmology

## 2020-07-19 ENCOUNTER — Other Ambulatory Visit: Payer: Self-pay

## 2020-07-19 ENCOUNTER — Encounter (HOSPITAL_COMMUNITY): Payer: Self-pay | Admitting: Ophthalmology

## 2020-07-19 ENCOUNTER — Ambulatory Visit (HOSPITAL_COMMUNITY): Payer: BC Managed Care – PPO | Admitting: Emergency Medicine

## 2020-07-19 ENCOUNTER — Encounter (HOSPITAL_COMMUNITY)
Admission: RE | Disposition: A | Discharge status: Home / Self Care | Payer: Self-pay | Source: Home / Self Care | Attending: Ophthalmology

## 2020-07-19 DIAGNOSIS — E119 Type 2 diabetes mellitus without complications: Secondary | ICD-10-CM | POA: Insufficient documentation

## 2020-07-19 DIAGNOSIS — Z87891 Personal history of nicotine dependence: Secondary | ICD-10-CM | POA: Diagnosis not present

## 2020-07-19 DIAGNOSIS — I252 Old myocardial infarction: Secondary | ICD-10-CM | POA: Insufficient documentation

## 2020-07-19 DIAGNOSIS — Z955 Presence of coronary angioplasty implant and graft: Secondary | ICD-10-CM | POA: Diagnosis not present

## 2020-07-19 DIAGNOSIS — G4730 Sleep apnea, unspecified: Secondary | ICD-10-CM | POA: Insufficient documentation

## 2020-07-19 DIAGNOSIS — I10 Essential (primary) hypertension: Secondary | ICD-10-CM | POA: Diagnosis not present

## 2020-07-19 DIAGNOSIS — I119 Hypertensive heart disease without heart failure: Secondary | ICD-10-CM | POA: Diagnosis not present

## 2020-07-19 DIAGNOSIS — Z8719 Personal history of other diseases of the digestive system: Secondary | ICD-10-CM | POA: Insufficient documentation

## 2020-07-19 DIAGNOSIS — Z8249 Family history of ischemic heart disease and other diseases of the circulatory system: Secondary | ICD-10-CM | POA: Insufficient documentation

## 2020-07-19 DIAGNOSIS — E785 Hyperlipidemia, unspecified: Secondary | ICD-10-CM | POA: Insufficient documentation

## 2020-07-19 DIAGNOSIS — F1290 Cannabis use, unspecified, uncomplicated: Secondary | ICD-10-CM | POA: Diagnosis not present

## 2020-07-19 DIAGNOSIS — F418 Other specified anxiety disorders: Secondary | ICD-10-CM | POA: Diagnosis not present

## 2020-07-19 DIAGNOSIS — H35341 Macular cyst, hole, or pseudohole, right eye: Secondary | ICD-10-CM | POA: Insufficient documentation

## 2020-07-19 HISTORY — PX: MEMBRANE PEEL: SHX5967

## 2020-07-19 HISTORY — DX: Type 2 diabetes mellitus without complications: E11.9

## 2020-07-19 HISTORY — PX: PHOTOCOAGULATION WITH LASER: SHX6027

## 2020-07-19 HISTORY — DX: Personal history of urinary calculi: Z87.442

## 2020-07-19 HISTORY — PX: GAS/FLUID EXCHANGE: SHX5334

## 2020-07-19 HISTORY — PX: 25 GAUGE PARS PLANA VITRECTOMY WITH 20 GAUGE MVR PORT FOR MACULAR HOLE: SHX6096

## 2020-07-19 HISTORY — DX: Depression, unspecified: F32.A

## 2020-07-19 HISTORY — DX: Bipolar disorder, unspecified: F31.9

## 2020-07-19 LAB — COMPREHENSIVE METABOLIC PANEL
ALT: 24 U/L (ref 0–44)
AST: 24 U/L (ref 15–41)
Albumin: 3.9 g/dL (ref 3.5–5.0)
Alkaline Phosphatase: 37 U/L — ABNORMAL LOW (ref 38–126)
Anion gap: 8 (ref 5–15)
BUN: 17 mg/dL (ref 8–23)
CO2: 27 mmol/L (ref 22–32)
Calcium: 9.2 mg/dL (ref 8.9–10.3)
Chloride: 103 mmol/L (ref 98–111)
Creatinine, Ser: 1.01 mg/dL (ref 0.61–1.24)
GFR, Estimated: >60 mL/min (ref >60)
Glucose, Bld: 113 mg/dL — ABNORMAL HIGH (ref 70–99)
Potassium: 3.7 mmol/L (ref 3.5–5.1)
Sodium: 138 mmol/L (ref 135–145)
Total Bilirubin: 0.9 mg/dL (ref 0.3–1.2)
Total Protein: 6.5 g/dL (ref 6.5–8.1)

## 2020-07-19 LAB — GLUCOSE, CAPILLARY
Glucose-Capillary: 123 mg/dL — ABNORMAL HIGH (ref 70–99)
Glucose-Capillary: 133 mg/dL — ABNORMAL HIGH (ref 70–99)

## 2020-07-19 SURGERY — 25 GAUGE PARS PLANA VITRECTOMY WITH 20 GAUGE MVR PORT FOR MACULAR HOLE
Anesthesia: General | Site: Eye | Laterality: Right

## 2020-07-19 MED ORDER — NA CHONDROIT SULF-NA HYALURON 40-30 MG/ML IO SOSY
INTRAOCULAR | Status: AC
  Filled 2020-07-19: qty 1

## 2020-07-19 MED ORDER — ATROPINE SULFATE 1 % OP SOLN
1.0000 [drp] | OPHTHALMIC | Status: AC | PRN
  Administered 2020-07-19 (×3): 1 [drp] via OPHTHALMIC
  Filled 2020-07-19: qty 5

## 2020-07-19 MED ORDER — DEXAMETHASONE SODIUM PHOSPHATE 10 MG/ML IJ SOLN
INTRAMUSCULAR | Status: AC
  Filled 2020-07-19: qty 1

## 2020-07-19 MED ORDER — CEFTAZIDIME 1 G IJ SOLR
INTRAMUSCULAR | Status: AC
  Filled 2020-07-19: qty 1

## 2020-07-19 MED ORDER — STERILE WATER FOR IRRIGATION IR SOLN
Status: DC | PRN
  Administered 2020-07-19: 1000 mL

## 2020-07-19 MED ORDER — STERILE WATER FOR INJECTION IJ SOLN
INTRAMUSCULAR | Status: AC
  Filled 2020-07-19: qty 10

## 2020-07-19 MED ORDER — BUPIVACAINE HCL (PF) 0.75 % IJ SOLN
INTRAMUSCULAR | Status: AC
  Filled 2020-07-19: qty 10

## 2020-07-19 MED ORDER — BSS IO SOLN
INTRAOCULAR | Status: AC
  Filled 2020-07-19: qty 15

## 2020-07-19 MED ORDER — FENTANYL CITRATE (PF) 250 MCG/5ML IJ SOLN
INTRAMUSCULAR | Status: DC | PRN
  Administered 2020-07-19: 25 ug via INTRAVENOUS
  Administered 2020-07-19: 100 ug via INTRAVENOUS
  Administered 2020-07-19: 25 ug via INTRAVENOUS

## 2020-07-19 MED ORDER — BRILLIANT BLUE G 0.025 % IO SOSY
PREFILLED_SYRINGE | INTRAOCULAR | Status: DC | PRN
  Administered 2020-07-19: 0.5 mL via INTRAVITREAL

## 2020-07-19 MED ORDER — PROMETHAZINE HCL 25 MG/ML IJ SOLN: 6.2500 mg | INTRAMUSCULAR | Status: DC | PRN

## 2020-07-19 MED ORDER — SODIUM CHLORIDE 0.9 % IV SOLN: INTRAVENOUS | Status: DC

## 2020-07-19 MED ORDER — POLYMYXIN B SULFATE 500000 UNITS IJ SOLR
INTRAMUSCULAR | Status: AC
  Filled 2020-07-19: qty 500000

## 2020-07-19 MED ORDER — PROPOFOL 10 MG/ML IV BOLUS
INTRAVENOUS | Status: AC
  Filled 2020-07-19: qty 20

## 2020-07-19 MED ORDER — PHENYLEPHRINE HCL 10 % OP SOLN
1.0000 [drp] | OPHTHALMIC | Status: AC | PRN
  Administered 2020-07-19 (×3): 1 [drp] via OPHTHALMIC
  Filled 2020-07-19: qty 5

## 2020-07-19 MED ORDER — GATIFLOXACIN 0.5 % OP SOLN OPTIME - NO CHARGE
OPHTHALMIC | Status: DC | PRN
  Administered 2020-07-19: 1 [drp] via OPHTHALMIC

## 2020-07-19 MED ORDER — PREDNISOLONE ACETATE 1 % OP SUSP
OPHTHALMIC | Status: DC | PRN
  Administered 2020-07-19: 1 [drp] via OPHTHALMIC

## 2020-07-19 MED ORDER — PHENYLEPHRINE 40 MCG/ML (10ML) SYRINGE FOR IV PUSH (FOR BLOOD PRESSURE SUPPORT)
PREFILLED_SYRINGE | INTRAVENOUS | Status: DC | PRN
  Administered 2020-07-19: 80 ug via INTRAVENOUS

## 2020-07-19 MED ORDER — 0.9 % SODIUM CHLORIDE (POUR BTL) OPTIME
TOPICAL | Status: DC | PRN
  Administered 2020-07-19: 1000 mL

## 2020-07-19 MED ORDER — DEXAMETHASONE SODIUM PHOSPHATE 10 MG/ML IJ SOLN
INTRAMUSCULAR | Status: DC | PRN
  Administered 2020-07-19: 5 mg via INTRAVENOUS

## 2020-07-19 MED ORDER — LIDOCAINE 2% (20 MG/ML) 5 ML SYRINGE
INTRAMUSCULAR | Status: DC | PRN
  Administered 2020-07-19: 60 mg via INTRAVENOUS

## 2020-07-19 MED ORDER — DORZOLAMIDE HCL-TIMOLOL MAL 2-0.5 % OP SOLN
OPHTHALMIC | Status: AC
  Filled 2020-07-19: qty 10

## 2020-07-19 MED ORDER — ONDANSETRON HCL 4 MG/2ML IJ SOLN
INTRAMUSCULAR | Status: DC | PRN
  Administered 2020-07-19: 4 mg via INTRAVENOUS

## 2020-07-19 MED ORDER — FENTANYL CITRATE (PF) 100 MCG/2ML IJ SOLN: 25.0000 ug | INTRAMUSCULAR | Status: DC | PRN

## 2020-07-19 MED ORDER — CARBACHOL 0.01 % IO SOLN
INTRAOCULAR | Status: AC
  Filled 2020-07-19: qty 1.5

## 2020-07-19 MED ORDER — FENTANYL CITRATE (PF) 250 MCG/5ML IJ SOLN
INTRAMUSCULAR | Status: AC
  Filled 2020-07-19: qty 5

## 2020-07-19 MED ORDER — GATIFLOXACIN 0.5 % OP SOLN
OPHTHALMIC | Status: AC
  Filled 2020-07-19: qty 2.5

## 2020-07-19 MED ORDER — MIDAZOLAM HCL 2 MG/2ML IJ SOLN
INTRAMUSCULAR | Status: AC
  Filled 2020-07-19: qty 2

## 2020-07-19 MED ORDER — PROPOFOL 10 MG/ML IV BOLUS
INTRAVENOUS | Status: DC | PRN
  Administered 2020-07-19: 50 mg via INTRAVENOUS
  Administered 2020-07-19: 150 mg via INTRAVENOUS

## 2020-07-19 MED ORDER — ACETAZOLAMIDE SODIUM 500 MG IJ SOLR
INTRAMUSCULAR | Status: AC
  Filled 2020-07-19: qty 500

## 2020-07-19 MED ORDER — TROPICAMIDE 1 % OP SOLN
1.0000 [drp] | OPHTHALMIC | Status: AC | PRN
  Administered 2020-07-19 (×3): 1 [drp] via OPHTHALMIC
  Filled 2020-07-19: qty 15

## 2020-07-19 MED ORDER — BACITRACIN-POLYMYXIN B 500-10000 UNIT/GM OP OINT
TOPICAL_OINTMENT | OPHTHALMIC | Status: AC
  Filled 2020-07-19: qty 3.5

## 2020-07-19 MED ORDER — ATROPINE SULFATE 1 % OP SOLN
OPHTHALMIC | Status: DC | PRN
  Administered 2020-07-19: 1 [drp] via OPHTHALMIC

## 2020-07-19 MED ORDER — TRIAMCINOLONE ACETONIDE 40 MG/ML IJ SUSP
INTRAMUSCULAR | Status: DC | PRN
  Administered 2020-07-19: 40 mg

## 2020-07-19 MED ORDER — ORAL CARE MOUTH RINSE: 15.0000 mL | Freq: Once | OROMUCOSAL | Status: AC

## 2020-07-19 MED ORDER — CHLORHEXIDINE GLUCONATE 0.12 % MT SOLN
15.0000 mL | Freq: Once | OROMUCOSAL | Status: AC
  Administered 2020-07-19: 15 mL via OROMUCOSAL
  Filled 2020-07-19: qty 15

## 2020-07-19 MED ORDER — NA CHONDROIT SULF-NA HYALURON 40-30 MG/ML IO SOSY
INTRAOCULAR | Status: DC | PRN
  Administered 2020-07-19: 0.5 mL via INTRAOCULAR

## 2020-07-19 MED ORDER — SODIUM CHLORIDE (PF) 0.9 % IJ SOLN
INTRAMUSCULAR | Status: AC
  Filled 2020-07-19: qty 10

## 2020-07-19 MED ORDER — LIDOCAINE HCL (PF) 2 % IJ SOLN
INTRAMUSCULAR | Status: AC
  Filled 2020-07-19: qty 10

## 2020-07-19 MED ORDER — BRIMONIDINE TARTRATE 0.2 % OP SOLN
OPHTHALMIC | Status: DC | PRN
  Administered 2020-07-19: 1 [drp] via OPHTHALMIC

## 2020-07-19 MED ORDER — PHENYLEPHRINE HCL-NACL 10-0.9 MG/250ML-% IV SOLN
INTRAVENOUS | Status: DC | PRN
  Administered 2020-07-19: 20 ug/min via INTRAVENOUS

## 2020-07-19 MED ORDER — SUGAMMADEX SODIUM 200 MG/2ML IV SOLN
INTRAVENOUS | Status: DC | PRN
  Administered 2020-07-19: 200 mg via INTRAVENOUS

## 2020-07-19 MED ORDER — STERILE WATER FOR INJECTION IJ SOLN
INTRAMUSCULAR | Status: DC | PRN
  Administered 2020-07-19: 20 mL

## 2020-07-19 MED ORDER — ROCURONIUM BROMIDE 10 MG/ML (PF) SYRINGE
PREFILLED_SYRINGE | INTRAVENOUS | Status: DC | PRN
  Administered 2020-07-19: 60 mg via INTRAVENOUS
  Administered 2020-07-19: 20 mg via INTRAVENOUS
  Administered 2020-07-19: 10 mg via INTRAVENOUS
  Administered 2020-07-19: 40 mg via INTRAVENOUS

## 2020-07-19 MED ORDER — DORZOLAMIDE HCL-TIMOLOL MAL 2-0.5 % OP SOLN
OPHTHALMIC | Status: DC | PRN
  Administered 2020-07-19: 1 [drp] via OPHTHALMIC

## 2020-07-19 MED ORDER — PREDNISOLONE ACETATE 1 % OP SUSP
OPHTHALMIC | Status: AC
  Filled 2020-07-19: qty 5

## 2020-07-19 MED ORDER — BSS PLUS IO SOLN
INTRAOCULAR | Status: AC
  Filled 2020-07-19: qty 500

## 2020-07-19 MED ORDER — BALANCED SALT IO SOLN
INTRAOCULAR | Status: DC | PRN
  Administered 2020-07-19: 15 mL via INTRAOCULAR

## 2020-07-19 MED ORDER — MIDAZOLAM HCL 2 MG/2ML IJ SOLN
INTRAMUSCULAR | Status: DC | PRN
  Administered 2020-07-19: 2 mg via INTRAVENOUS

## 2020-07-19 MED ORDER — EPINEPHRINE PF 1 MG/ML IJ SOLN
INTRAOCULAR | Status: DC | PRN
  Administered 2020-07-19: 500 mL

## 2020-07-19 MED ORDER — ACETAMINOPHEN 500 MG PO TABS: 1000.0000 mg | ORAL_TABLET | Freq: Once | ORAL | Status: DC

## 2020-07-19 MED ORDER — EPINEPHRINE PF 1 MG/ML IJ SOLN
INTRAMUSCULAR | Status: AC
  Filled 2020-07-19: qty 1

## 2020-07-19 MED ORDER — PROPARACAINE HCL 0.5 % OP SOLN
1.0000 [drp] | OPHTHALMIC | Status: AC | PRN
  Administered 2020-07-19 (×3): 1 [drp] via OPHTHALMIC
  Filled 2020-07-19: qty 15

## 2020-07-19 MED ORDER — TRIAMCINOLONE ACETONIDE 40 MG/ML IJ SUSP
INTRAMUSCULAR | Status: AC
  Filled 2020-07-19: qty 5

## 2020-07-19 MED ORDER — BRIMONIDINE TARTRATE 0.2 % OP SOLN
OPHTHALMIC | Status: AC
  Filled 2020-07-19: qty 5

## 2020-07-19 MED ORDER — BACITRACIN-POLYMYXIN B 500-10000 UNIT/GM OP OINT
TOPICAL_OINTMENT | OPHTHALMIC | Status: DC | PRN
  Administered 2020-07-19: 1 via OPHTHALMIC

## 2020-07-19 SURGICAL SUPPLY — 52 items
APPLICATOR COTTON TIP 6 STRL (MISCELLANEOUS) ×4 IMPLANT
APPLICATOR COTTON TIP 6IN STRL (MISCELLANEOUS) ×8
BAND WRIST GAS GREEN (MISCELLANEOUS) IMPLANT
BLADE EYE CATARACT 19 1.4 BEAV (BLADE) IMPLANT
BNDG EYE OVAL (GAUZE/BANDAGES/DRESSINGS) ×2 IMPLANT
CABLE BIPOLOR RESECTION CORD (MISCELLANEOUS) ×2 IMPLANT
CANNULA ANT CHAM MAIN (OPHTHALMIC RELATED) IMPLANT
CANNULA FLEX TIP 25G (CANNULA) ×2 IMPLANT
CLSR STERI-STRIP ANTIMIC 1/2X4 (GAUZE/BANDAGES/DRESSINGS) ×2 IMPLANT
DRAPE MICROSCOPE LEICA 46X105 (MISCELLANEOUS) ×2 IMPLANT
DRAPE OPHTHALMIC 77X100 STRL (CUSTOM PROCEDURE TRAY) ×2 IMPLANT
ERASER HMR WETFIELD 23G BP (MISCELLANEOUS) IMPLANT
FILTER BLUE MILLIPORE (MISCELLANEOUS) IMPLANT
FILTER STRAW FLUID ASPIR (MISCELLANEOUS) ×2 IMPLANT
FORCEPS GRIESHABER ILM 25G A (INSTRUMENTS) ×2 IMPLANT
FORCEPS GRIESHABER MAX 25G (MISCELLANEOUS) IMPLANT
GAS AUTO FILL CONSTEL (OPHTHALMIC) ×2
GAS AUTO FILL CONSTELLATION (OPHTHALMIC) ×1 IMPLANT
GAS WRIST BAND GREEN (MISCELLANEOUS)
GLOVE BIO SURGEON STRL SZ7.5 (GLOVE) ×4 IMPLANT
GLOVE BIOGEL M 7.0 STRL (GLOVE) ×2 IMPLANT
GOWN STRL REUS W/ TWL LRG LVL3 (GOWN DISPOSABLE) ×2 IMPLANT
GOWN STRL REUS W/ TWL XL LVL3 (GOWN DISPOSABLE) ×1 IMPLANT
GOWN STRL REUS W/TWL LRG LVL3 (GOWN DISPOSABLE) ×2
GOWN STRL REUS W/TWL XL LVL3 (GOWN DISPOSABLE) ×1
KIT BASIN OR (CUSTOM PROCEDURE TRAY) ×2 IMPLANT
KIT PERFLUORON PROCEDURE 5ML (MISCELLANEOUS) IMPLANT
LENS VITRECTOMY FLAT OCLR DISP (MISCELLANEOUS) ×2 IMPLANT
LOOP FINESSE 25 GA (MISCELLANEOUS) IMPLANT
MICROPICK 25G (MISCELLANEOUS)
NEEDLE 18GX1X1/2 (RX/OR ONLY) (NEEDLE) ×8 IMPLANT
NEEDLE 25GX 5/8IN NON SAFETY (NEEDLE) ×4 IMPLANT
NEEDLE HYPO 30X.5 LL (NEEDLE) IMPLANT
PACK VITRECTOMY CUSTOM (CUSTOM PROCEDURE TRAY) ×2 IMPLANT
PAD ARMBOARD 7.5X6 YLW CONV (MISCELLANEOUS) ×4 IMPLANT
PAK PIK VITRECTOMY CVS 25GA (OPHTHALMIC) ×2 IMPLANT
PENCIL BIPOLAR 25GA STR DISP (OPHTHALMIC RELATED) IMPLANT
PICK MICROPICK 25G (MISCELLANEOUS) IMPLANT
PROBE ENDO DIATHERMY 25G (MISCELLANEOUS) ×2 IMPLANT
PROBE LASER ILLUM FLEX CVD 25G (OPHTHALMIC) ×2 IMPLANT
REPL STRA BRUSH NEEDLE (NEEDLE) IMPLANT
RESERVOIR BACK FLUSH (MISCELLANEOUS) IMPLANT
RETRACTOR IRIS FLEX 25G GRIESH (INSTRUMENTS) IMPLANT
STOPCOCK 4 WAY LG BORE MALE ST (IV SETS) IMPLANT
SUT VICRYL 7 0 TG140 8 (SUTURE) ×2 IMPLANT
SYR 10ML LL (SYRINGE) ×2 IMPLANT
SYR 20ML LL LF (SYRINGE) IMPLANT
SYR 5ML LL (SYRINGE) IMPLANT
SYR TB 1ML LUER SLIP (SYRINGE) ×8 IMPLANT
TOWEL GREEN STERILE FF (TOWEL DISPOSABLE) ×2 IMPLANT
TUBING HIGH PRESS EXTEN 6IN (TUBING) ×2 IMPLANT
WATER STERILE IRR 1000ML POUR (IV SOLUTION) ×2 IMPLANT

## 2020-07-19 NOTE — Anesthesia Procedure Notes (Signed)
Procedure Name: Intubation Date/Time: 07/19/2020 11:49 AM Performed by: [name], CRNA Pre-anesthesia Checklist: Patient identified, Emergency Drugs available, Suction available and Patient being monitored Patient Re-evaluated:Patient Re-evaluated prior to induction Oxygen Delivery Method: Circle system utilized Preoxygenation: Pre-oxygenation with 100% oxygen Induction Type: IV induction Ventilation: Mask ventilation without difficulty Laryngoscope Size: Mac and 4 Grade View: Grade I Tube type: Oral Tube size: 7.5 mm Number of attempts: 1 Airway Equipment and Method: Stylet and Oral airway Placement Confirmation: ETT inserted through vocal cords under direct vision,  positive ETCO2 and breath sounds checked- equal and bilateral Secured at: 21 cm Tube secured with: Tape Dental Injury: Teeth and Oropharynx as per pre-operative assessment

## 2020-07-19 NOTE — Discharge Instructions (Signed)
POSTOPERATIVE INSTRUCTIONS  Your doctor has performed vitreoretinal surgery on you at [facility].  - Keep eye patched and shielded until seen by Dr. Nicko Daher 8 AM tomorrow in clinic - Do not use drops until return - FACE DOWN POSITIONING WHILE AWAKE - Sleep with belly down or on left side, avoid laying flat on back.    - No strenuous bending, stooping or lifting.  - You may not drive until further notice.  - If your doctor used a gas bubble in your eye during the procedure he will advise you on postoperative positioning. If you have a gas bubble you will be wearing a green bracelet that was applied in the operating room. The green bracelet should stay on as long as the gas bubble is in your eye. While the gas bubble is present you should not fly in an airplane. If you require general anesthesia while the gas bubble is present you must notify your anesthesiologist that an intraocular gas bubble is present so he can take the appropriate precautions.  - Tylenol or any other over-the-counter pain reliever can be used according to your doctor. If more pain medicine is required, your doctor will have a prescription for you.  - You may read, go up and down stairs, and watch television.     Macoy Rodwell, M.D., Ph.D.  

## 2020-07-19 NOTE — Anesthesia Preprocedure Evaluation (Addendum)
Anesthesia Evaluation  Patient identified by MRN, date of birth, ID band Patient awake    Reviewed: Allergy & Precautions, NPO status , Patient's Chart, lab work & pertinent test results  Airway Mallampati: II  TM Distance: >3 FB Neck ROM: Full    Dental  (+) Dental Advisory Given   Pulmonary sleep apnea and Continuous Positive Airway Pressure Ventilation , Patient abstained from smoking., former smoker,    breath sounds clear to auscultation       Cardiovascular hypertension, Pt. on medications + CAD and + Cardiac Stents  + dysrhythmias  Rhythm:Regular Rate:Normal     Neuro/Psych negative neurological ROS     GI/Hepatic Neg liver ROS, GERD  ,  Endo/Other  diabetes, Type 2  Renal/GU Renal disease     Musculoskeletal  (+) Arthritis ,   Abdominal   Peds  Hematology   Anesthesia Other Findings   Reproductive/Obstetrics                            Anesthesia Physical Anesthesia Plan  ASA: III  Anesthesia Plan: General   Post-op Pain Management:    Induction: Intravenous  PONV Risk Score and Plan: 2 and Dexamethasone, Ondansetron and Treatment may vary due to age or medical condition  Airway Management Planned: Oral ETT  Additional Equipment: None  Intra-op Plan:   Post-operative Plan: Extubation in OR  Informed Consent: I have reviewed the patients History and Physical, chart, labs and discussed the procedure including the risks, benefits and alternatives for the proposed anesthesia with the patient or authorized representative who has indicated his/her understanding and acceptance.     Dental advisory given  Plan Discussed with: CRNA  Anesthesia Plan Comments:         Anesthesia Quick Evaluation

## 2020-07-19 NOTE — Op Note (Signed)
Date of procedure:  07/19/2020   Surgeon: Bernarda Caffey, MD, PhD   Assistant: Ernest Mallick, Ophthalmic Assistant   Pre-operative Diagnoses: Full thickness macular hole, Right Eye   Post-operative diagnosis: Full thickness macular hole, Right Eye   Anesthesia: General   Procedure:   1. 25 gauge pars plana vitrectomy, Right Eye 2. TissueBlue stain, Right Eye 3. Internal Limiting Membrane peel, Right Eye  4. Endolaser, Right Eye 5. Injection 14% C3F8, Right Eye     Indications for procedure: The patient presented with decreased vision in the right eye and on examination, a full thickness macular hole of the right. After discussing the risks, benefits, and alternatives to surgery, the patient electively decided to undergo surgical repair and informed consent was obtained.  The surgery was an attempt to close the macular hole and potentially improve the vision within the reasonable expectations of the surgeon.    Procedure in Detail:    The patient was met in the pre-operative holding area where their identification data was verified.  It was noted that there was a signed, informed consent in the chart and the Right Eye eye was verbally verified by the patient as the operative eye and was marked with a marking pen. The patient was then taken to the operating room and placed in the supine position. General endotracheal anesthesia was induced.   The eye was then prepped with 5% betadine and draped in the normal fashion for ophthalmic surgery. The microscope was draped and swung into position, and a secondary time-out was performed to identify the correct patient, eye, procedures, and any allergies.   A 25 gauge trocar was inserted in a 30-45 degrees fashion into the inferotemporal quadrant 4 mm posterior to the limbus in this phakic patient.  Correct positioning within the vitreous was verified externally with the light pipe.  The infusion was then connected to the cannula and BSS infusion was  commenced.  Additional ports were placed in the superonasal and superotemporal quadrants. Viscoat was placed on the cornea. The BIOM was used to visualize the posterior segment while the core vitrectomy was completed.  The patient had a visible full thickness macular hole and a posterior vitreous detachment was confirmed using suction over the optic nerve head and lifting anteriorly. The remaining vitreous was removed. Kenalog was used to aid in this process. A thorough peripheral vitrectomy was performed.      TissueBlue was then used to stain the internal limiting membrane. A macular contact lens was placed on the eye. End-grasping ILM forceps were used to create an opening in the ILM and the ILM was peeled fully from the macula taking care to avoid traction on the macular hole.    The wide angle viewing system was brought back into position. Scleral depression was performed and used to shave the vitreous base and to inspect the peripheral retina. On 360 inspection, there were 2 small VR tufts at 12 and 1 oclock. Peripheral 360 prophylactic endolaser was placed just posterior to ora under scleral depression. An air fluid exchange was performed.    The superotemporal port was then removed and sutured with 7-0 vicryl, there was no leakage. 12% C3F8 gas was connected to the infusion line and gas was injected into the posterior segment while venting air through the superonasal trocar using the extrusion cannula. Once a full, 40cc of gas was vented through the eye, the infusion port and venting ports were removed and they were sutured with 7-0 vicryl.  There was  no leakage from the sclerotomy sites.   Subconjunctival injections of kefzol + bacitracin + polymixin b and kenalog were then administered, and antibiotic and steroid drops as well as antibiotic ointment were placed in the eye.  The drapes were removed and the eye was patched and shielded.  A green gas bracelet was placed on the patient's wrist. The  patient was then taken to the post-operative area for recovery having tolerated the procedure well.  He was instructed to perform face down positioning postoperatively and to follow up in clinic the following morning as scheduled.   Estimate blood lost: none Complications: None

## 2020-07-19 NOTE — Transfer of Care (Signed)
Immediate Anesthesia Transfer of Care Note  Patient: Stevon Gough.  Procedure(s) Performed: 25 GAUGE PARS PLANA VITRECTOMY FOR MACULAR HOLE (Right Eye) MEMBRANE PEEL (Right Eye) PHOTOCOAGULATION WITH LASER (Right Eye) GAS/FLUID EXCHANGE (Right Eye)  Patient Location: PACU  Anesthesia Type:General  Level of Consciousness: awake, alert  and oriented  Airway & Oxygen Therapy: Patient Spontanous Breathing and Patient connected to nasal cannula oxygen  Post-op Assessment: Report given to RN and Post -op Vital signs reviewed and stable  Post vital signs: Reviewed and stable  Last Vitals:  Vitals Value Taken Time  BP 131/60 07/19/20 1412  Temp    Pulse 74 07/19/20 1416  Resp 14 07/19/20 1416  SpO2 91 % 07/19/20 1416  Vitals shown include unvalidated device data.  Last Pain:  Vitals:   07/19/20 0946  TempSrc:   PainSc: 0-No pain         Complications: No complications documented.

## 2020-07-19 NOTE — Progress Notes (Signed)
[facility] Note  07/20/2020     CHIEF COMPLAINT Patient presents for Post-op Follow-up   HISTORY OF PRESENT ILLNESS: Keith Gross. is a 65 y.o. male who presents to the clinic today for:   HPI    Post-op Follow-up    In right eye.  Vision is stable.  I, the attending physician,  performed the HPI with the patient and updated documentation appropriately.          Comments    Pt states vision is very blurry today OD.  Denies eye pain.  States he slept OK last night.  Pt reports compliance in face down positioning.       Last edited by Bernarda Caffey, MD on 07/20/2020  1:21 PM. (History)    pt states last night went pretty well, he slept on his side and face down as directed  Referring physician: Dwain Sarna, [place], [place] [id number]  HISTORICAL INFORMATION:   Selected notes from the MEDICAL RECORD NUMBER Referred by Dr. Dwain Sarna LEE: 03/20/2020 BCVA OD: 20/30 OS: 20/20 Ocular Hx- Myopia, Macular Hole OD, DM without retinopathy PMH- DM    CURRENT MEDICATIONS: No current outpatient medications on file. (Ophthalmic Drugs)   No current facility-administered medications for this visit. (Ophthalmic Drugs)   Current Outpatient Medications (Other)  Medication Sig  . atorvastatin (LIPITOR) 20 MG tablet Take 1 tablet (20 mg total) by mouth daily. (Patient taking differently: Take 20 mg by mouth at bedtime.)  . BD HYPODERMIC NEEDLE 16G X 1" MISC FOR USE WITH TESTOSTERONE INJECTIONS  . BD HYPODERMIC NEEDLE 18G X 1" MISC USE WITH TESTOSTERONE INJECTIONS  . CINNAMON PO Take 1,000 mg by mouth 2 (two) times daily.   . clopidogrel (PLAVIX) 75 MG tablet Take 1 tablet (75 mg total) by mouth daily.  . Cyanocobalamin (VITAMIN B-12) 5000 MCG SUBL Place 5,000 mcg under the tongue daily.  . fluticasone (FLONASE) 50 MCG/ACT nasal spray USE 2 SPRAYS IN EACH NOSTRIL DAILY AS DIRECTED (Patient taking differently: Place 2 sprays into both  nostrils at bedtime.)  . glucose blood test strip Use as instructed to check sugars once daily. Dx E11.9  . hydrochlorothiazide (HYDRODIURIL) 25 MG tablet Take 1 tablet (25 mg total) by mouth daily as needed. (Patient not taking: Reported on 07/12/2020)  . hydrOXYzine (VISTARIL) 50 MG capsule Take 50 mg by mouth 2 (two) times daily.  Marland Kitchen lamoTRIgine (LAMICTAL) 150 MG tablet Take 150 mg by mouth at bedtime.  . lamoTRIgine (LAMICTAL) 200 MG tablet Take 200 mg by mouth daily.  . Lancets (ONETOUCH ULTRASOFT) lancets Use as instructed to check sugars once daily. Dx E 11.9  . magnesium oxide (MAG-OX) 400 MG tablet Take 400 mg by mouth daily.  . metFORMIN (GLUCOPHAGE-XR) 500 MG 24 hr tablet Take 500 mg by mouth every morning.  . olmesartan-hydrochlorothiazide (BENICAR HCT) 40-25 MG tablet Take 1 tablet by mouth at bedtime.  . pantoprazole (PROTONIX) 40 MG tablet TAKE ONE TABLET BY MOUTH ONE TIME DAILY (Patient taking differently: Take 40 mg by mouth daily.)  . polyethylene glycol powder (GLYCOLAX/MIRALAX) powder TAKE 17 GRAMS BY MOUTH EVERY DAY as needed (as directed) (Patient taking differently: Take 17 g by mouth daily.)  . potassium citrate (UROCIT-K) 10 MEQ (1080 MG) SR tablet Take 1 tablet (10 mEq total) by mouth 2 (two) times daily. (Patient taking differently: Take 10 mEq by mouth daily.)  . Syringe, Disposable, (B-D SYRINGE LUER-LOK 3CC)  3 ML MISC 1 mL by Does not apply route every 14 (fourteen) days.  . SYRINGE-NEEDLE, DISP, 3 ML (B-D 3CC LUER-LOK SYR 22GX1") 22G X 1" 3 ML MISC FOR TESTOSTERONE INJECTIONS  . tamsulosin (FLOMAX) 0.4 MG CAPS capsule Take 1 capsule (0.4 mg total) by mouth daily.  Marland Kitchen testosterone cypionate (DEPOTESTOSTERONE CYPIONATE) 200 MG/ML injection Inject 0.7 mLs (140 mg total) into the muscle every 7 (seven) days. (Patient taking differently: Inject 140 mg into the muscle every 7 (seven) days. Every Monday)  . traZODone (DESYREL) 100 MG tablet Take 200 mg by mouth at bedtime.   Marland Kitchen  venlafaxine XR (EFFEXOR-XR) 150 MG 24 hr capsule Take 150 mg by mouth daily with breakfast.  . VITAMIN D, CHOLECALCIFEROL, PO Take 2,000 mg by mouth daily.  Marland Kitchen VITAMIN E PO Take 2,000 Units by mouth daily.  Marland Kitchen zinc gluconate 50 MG tablet Take 50 mg by mouth daily.   No current facility-administered medications for this visit. (Other)      REVIEW OF SYSTEMS: ROS    Positive for: Gastrointestinal, Genitourinary, Musculoskeletal, Cardiovascular, Eyes, Respiratory, Psychiatric   Negative for: Constitutional, Neurological, Skin, HENT, Endocrine, Allergic/Imm, Heme/Lymph   Last edited by Doneen Poisson on 07/20/2020  8:22 AM. (History)       ALLERGIES No Known Allergies  PAST MEDICAL HISTORY Past Medical History:  Diagnosis Date  . Acid reflux   . Angina 2013   no current problems  . Anxiety   . Arthritis   . Bipolar disorder ([place])    type 2  . Depression   . Diabetes mellitus without complication ([place])    type 2  . Diastasis of muscle 2011   "abdominal"  . Dysplastic polyp of colon 2008   [facility]  . GERD (gastroesophageal reflux disease)   . Gout    Multiple episodes - no current problems  . Heart disease   . History of kidney stones    surgery to remove stones  . Hyperlipidemia   . Hypertension   . Myocardial infarction [facility]) 05/2011   "we think";  06/2011 abnl mv;  06/2011 Cath - subtotal occlusion of Ramus, otw nonobs dzs;  06/2011 PCI/DES of Ramus w/ 2.25x57m Resolute DES   . Sleep apnea    uses CPAP nightly  . Syncopal episodes 05/2020  . Syncope 05/2020  . Treadmill stress test negative for angina pectoris 2011   Past Surgical History:  Procedure Laterality Date  . 2SharpVITRECTOMY WITH 20 GAUGE MVR PORT FOR MACULAR HOLE Right 07/19/2020   Procedure: 25 GAUGE PARS PLANA VITRECTOMY FOR MACULAR HOLE;  Surgeon: ZBernarda Caffey MD;  [facility]  Service: Ophthalmology;  Laterality: Right;  . CARDIAC CATHETERIZATION  06/2011   "diagnostic"  .  COLONOSCOPY    . CORONARY ANGIOPLASTY WITH STENT PLACEMENT  07/09/11   "1"  . GAS/FLUID EXCHANGE Right 07/19/2020   Procedure: GAS/FLUID EXCHANGE;  Surgeon: ZBernarda Caffey MD;  [facility]  Service: Ophthalmology;  Laterality: Right;  c3 f8  . HEMORRHOID SURGERY  2000  . [place]  [place]  . LEFT HEART CATHETERIZATION WITH CORONARY ANGIOGRAM N/A 10/21/2011   Procedure: LEFT HEART CATHETERIZATION WITH CORONARY ANGIOGRAM;  Surgeon: THillary Bow MD;  [facility];  Service: Cardiovascular;  Laterality: N/A;  . LITHOTRIPSY  10/2008  . MEMBRANE PEEL Right 07/19/2020   Procedure: MEMBRANE PEEL;  Surgeon: ZBernarda Caffey MD;  [facility]  Service: Ophthalmology;  Laterality:  Right;  Marland Kitchen PERCUTANEOUS CORONARY STENT INTERVENTION (PCI-S) N/A 07/09/2011   Procedure: PERCUTANEOUS CORONARY STENT INTERVENTION (PCI-S);  Surgeon: Sherren Mocha, MD;  Location: [facility] C[facility] LAB;  Service: Cardiovascular;  Laterality: N/A;  . PHOTOCOAGULATION WITH LASER Right 07/19/2020   Procedure: PHOTOCOAGULATION WITH LASER;  Surgeon: Bernarda Caffey, MD;  Location: [facility];  Service: Ophthalmology;  Laterality: Right;  . UPPER GI ENDOSCOPY    . URETEROSCOPY  10/2009    FAMILY HISTORY Family History  Problem Relation Age of Onset  . Heart attack Father 38       LVAD  . Hypertension Other     SOCIAL HISTORY Social History   Tobacco Use  . Smoking status: Former Smoker    Packs/day: 0.50    Years: 20.00    Pack years: 10.00    Types: Cigarettes    Quit date: 02/17/1990    Years since quitting: 30.4  . Smokeless tobacco: Never Used  Vaping Use  . Vaping Use: Never used  Substance Use Topics  . Alcohol use: Not Currently    Alcohol/week: 4.0 standard drinks    Types: 2 Glasses of wine, 2 Cans of beer per week  . Drug use: Yes    Frequency: 7.0 times per week    Types: Marijuana    Comment: 07/09/11 "lets say a cigarette a day" Last used         OPHTHALMIC EXAM:  Base Eye Exam     Visual Acuity (Snellen - Linear)      Right Left   Dist cc HM 20/25 -2   Dist ph cc NI NI   Correction: Glasses       Tonometry (Tonopen, 8:21 AM)      Right Left   Pressure 16 def       Pupils      Dark Light Shape React APD   Right 6 6 Round Dilated 0   Left 3 2 Round Brisk 0       Extraocular Movement      Right Left    Full Full       Neuro/Psych    Oriented x3: Yes   Mood/Affect: Normal       Dilation    Right eye: 1.0% Mydriacyl, 2.5% Phenylephrine @ 8:21 AM        Slit Lamp and Fundus Exam    External Exam      Right Left   External Normal Normal       Slit Lamp Exam      Right Left   Lids/Lashes Dermatochalasis - upper lid Dermatochalasis - upper lid   Conjunctiva/Sclera Subconjunctival hemorrhage, pneumatic chemosis temporally White and quiet   Cornea Mild arcus, 1+PEE, endo pigment Mild arcus, trace PEE   Anterior Chamber moderate depth Deep and quiet   Iris Round and dilated Round and dilated   Lens 2+ Nuclear sclerosis, 2+ Cortical cataract 2+ Nuclear sclerosis, 2+ Cortical cataract   Vitreous Hazy view, post vitrectomy, good gas fill ~95% Vitreous syneresis, Posterior vitreous detachment       Fundus Exam      Right Left   Disc Pink and Sharp, mild tilt, temporal PPA    C/D Ratio 0.3 0.4   Macula Flat under gas, Mac hole closing    Vessels mild attenuation, mild tortuousity    Periphery Attached, 360 peripheral laser changes         Refraction    Wearing Rx      Sphere Cylinder Axis  Add   Right -1.25 +1.00 125 +2.50   Left -1.75 +1.00 155 +2.50          IMAGING AND PROCEDURES  Imaging and Procedures for 07/20/2020         ASSESSMENT/PLAN:    ICD-10-CM   1. Macular hole of right eye  H35.341   2. Retinal edema  H35.81   3. Essential hypertension  I10   4. Hypertensive retinopathy of both eyes  H35.033   5. Combined forms of age-related cataract of both eyes  H25.813     1,2. Macular hole, OD  - full thickness macular  hole with mild surrounding CME  - initially diagnosed and referred here in Feb 2022  - BCVA 20/50 OD  - now POD1 s/p PPV/TissueBlue/MP/14% C3F8 OD, 06.02.22             - doing well this morning             - retina attached and good gas bubble in place w/ mac hole closing             - IOP okay at 16  - good gas fill             - start PF 6x/day OD                         zymaxid QID OD                          Atropine BID OD   Brimonidine BID OD                         PSO ung QID OD              - cont face down positioning x3 days then can decrease positioning to 50% of time; avoid laying flat on back              - eye shield when sleeping              - post op drop and positioning instructions reviewed  - tylenol/ibuprofen for pain - f/u next Thursday for POV2  3,4. Hypertensive retinopathy OU - discussed importance of tight BP control - monitor  5. Mixed Cataract OU - The symptoms of cataract, surgical options, and treatments and risks were discussed with patient. - discussed diagnosis and progression, specifically discussed likelihood of cataract progression post macular hole repair - monitor  Ophthalmic Meds Ordered this visit:  No orders of the defined types were placed in this encounter.     Return in about 6 days (around 07/26/2020) for f/u mac hole OD, DFE.  There are no Patient Instructions on file for this visit.   Explained the diagnoses, plan, and follow up with the patient and they expressed understanding.  Patient expressed understanding of the importance of proper follow up care.   This document serves as a record of services personally performed by Gardiner Sleeper, MD, PhD. It was created on their behalf by San Jetty. Owens Shark, OA an ophthalmic technician. The creation of this record is the provider's dictation and/or activities during the visit.    Electronically signed by: San Jetty. Owens Shark, [place] 06.02.2022 1:23 PM  Gardiner Sleeper, M.D., Ph.D. Diseases &  Surgery of the Retina and Vitreous [facility]  I have reviewed the above documentation  for accuracy and completeness, and I agree with the above. Gardiner Sleeper, M.D., Ph.D. 07/20/20 1:23 PM   Abbreviations: M myopia (nearsighted); A astigmatism; H hyperopia (farsighted); P presbyopia; Mrx spectacle prescription;  CTL contact lenses; OD right eye; OS left eye; OU both eyes  XT exotropia; ET esotropia; PEK punctate epithelial keratitis; PEE punctate epithelial erosions; DES dry eye syndrome; MGD meibomian gland dysfunction; ATs artificial tears; PFAT's preservative free artificial tears; [place] nuclear sclerotic cataract; PSC posterior subcapsular cataract; ERM epi-retinal membrane; PVD posterior vitreous detachment; RD retinal detachment; DM diabetes mellitus; DR diabetic retinopathy; NPDR non-proliferative diabetic retinopathy; PDR proliferative diabetic retinopathy; CSME clinically significant macular edema; DME diabetic macular edema; dbh dot blot hemorrhages; CWS cotton wool spot; POAG primary open angle glaucoma; C/D cup-to-disc ratio; HVF humphrey visual field; GVF goldmann visual field; OCT optical coherence tomography; IOP intraocular pressure; BRVO Branch retinal vein occlusion; CRVO central retinal vein occlusion; CRAO central retinal artery occlusion; BRAO branch retinal artery occlusion; RT retinal tear; SB scleral buckle; PPV pars plana vitrectomy; VH Vitreous hemorrhage; PRP panretinal laser photocoagulation; IVK intravitreal kenalog; VMT vitreomacular traction; MH Macular hole;  NVD neovascularization of the disc; NVE neovascularization elsewhere; AREDS age related eye disease study; ARMD age related macular degeneration; POAG primary open angle glaucoma; EBMD epithelial/anterior basement membrane dystrophy; ACIOL anterior chamber intraocular lens; IOL intraocular lens; PCIOL posterior chamber intraocular lens; Phaco/IOL phacoemulsification with intraocular lens placement;  [place] photorefractive keratectomy; LASIK laser assisted in situ keratomileusis; HTN hypertension; DM diabetes mellitus; COPD chronic obstructive pulmonary disease

## 2020-07-19 NOTE — Brief Op Note (Signed)
07/19/2020  2:07 PM  PATIENT:  [name]  65 y.o. male  PRE-OPERATIVE DIAGNOSIS:  right eye, full thickness macular hole  POST-OPERATIVE DIAGNOSIS:  right eye, full thickness macular hole  PROCEDURE:  Procedure(s) with comments: 25 GAUGE PARS PLANA VITRECTOMY FOR MACULAR HOLE (Right) MEMBRANE PEEL (Right) PHOTOCOAGULATION WITH LASER (Right) GAS/FLUID EXCHANGE (Right) - c3 f8  SURGEON:  Surgeon(s) and Role:    * Bernarda Caffey, MD - Primary  ASSISTANTS: Ernest Mallick, Ophthalmic Assistant    ANESTHESIA:   general  EBL:  minimal   BLOOD ADMINISTERED:none  DRAINS: none   LOCAL MEDICATIONS USED:  NONE  SPECIMEN:  No Specimen  DISPOSITION OF SPECIMEN:  N/A  COUNTS:  YES  TOURNIQUET:  * No tourniquets in log *  DICTATION: .Note written in EPIC  PLAN OF CARE: Discharge to home after PACU  PATIENT DISPOSITION:  PACU - hemodynamically stable.   Delay start of Pharmacological VTE agent (>24hrs) due to surgical blood loss or risk of bleeding: not applicable

## 2020-07-19 NOTE — Interval H&P Note (Signed)
History and Physical Interval Note:  07/19/2020 10:35 AM  Keith Gross.  has presented today for surgery, with the diagnosis of right eye, full thickness macular hole.  The various methods of treatment have been discussed with the patient and family. After consideration of risks, benefits and other options for treatment, the patient has consented to  Procedure(s): 25 GAUGE PARS PLANA VITRECTOMY WITH 20 GAUGE MVR PORT FOR MACULAR HOLE (Right) as a surgical intervention.  The patient's history has been reviewed, patient examined, no change in status, stable for surgery.  I have reviewed the patient's chart and labs.  Questions were answered to the patient's satisfaction.     Bernarda Caffey

## 2020-07-20 ENCOUNTER — Encounter (HOSPITAL_COMMUNITY): Payer: Self-pay | Admitting: Ophthalmology

## 2020-07-20 ENCOUNTER — Other Ambulatory Visit: Payer: Self-pay

## 2020-07-20 ENCOUNTER — Ambulatory Visit (INDEPENDENT_AMBULATORY_CARE_PROVIDER_SITE_OTHER): Payer: BC Managed Care – PPO | Admitting: Ophthalmology

## 2020-07-20 DIAGNOSIS — I10 Essential (primary) hypertension: Secondary | ICD-10-CM

## 2020-07-20 DIAGNOSIS — H3581 Retinal edema: Secondary | ICD-10-CM

## 2020-07-20 DIAGNOSIS — H35033 Hypertensive retinopathy, bilateral: Secondary | ICD-10-CM

## 2020-07-20 DIAGNOSIS — H25813 Combined forms of age-related cataract, bilateral: Secondary | ICD-10-CM

## 2020-07-20 DIAGNOSIS — H35341 Macular cyst, hole, or pseudohole, right eye: Secondary | ICD-10-CM

## 2020-07-20 NOTE — Anesthesia Postprocedure Evaluation (Signed)
Anesthesia Post Note  Patient: Keith Gross.  Procedure(s) Performed: 25 GAUGE PARS PLANA VITRECTOMY FOR MACULAR HOLE (Right Eye) MEMBRANE PEEL (Right Eye) PHOTOCOAGULATION WITH LASER (Right Eye) GAS/FLUID EXCHANGE (Right Eye)     Patient location during evaluation: PACU Anesthesia Type: General Level of consciousness: awake and alert Pain management: pain level controlled Vital Signs Assessment: post-procedure vital signs reviewed and stable Respiratory status: spontaneous breathing, nonlabored ventilation, respiratory function stable and patient connected to nasal cannula oxygen Cardiovascular status: blood pressure returned to baseline and stable Postop Assessment: no apparent nausea or vomiting Anesthetic complications: no   No complications documented.  Last Vitals:  Vitals:   07/19/20 1430 07/19/20 1444  BP: 137/65 134/66  Pulse: 83 64  Resp: 17 17  Temp:  37.7 C  SpO2: 94% 93%    Last Pain:  Vitals:   07/19/20 1444  TempSrc:   PainSc: 0-No pain                 Tiajuana Amass

## 2020-07-25 NOTE — Progress Notes (Signed)
[facility] Note  07/26/2020     CHIEF COMPLAINT Patient presents for Post-op Follow-up   HISTORY OF PRESENT ILLNESS: Keith Gross. is a 65 y.o. male who presents to the clinic today for:   HPI     Post-op Follow-up   In right eye.  Vision is stable.  I, the attending physician,  performed the HPI with the patient and updated documentation appropriately.        Comments   Pt states vision is still very blurry OD--states like looking through a coke bottle.  Pt denies eye pain.  Pt has continued to keep OD covered with patch.  Pt reports compliance with face down positioning and use of post op drops.      Last edited by Bernarda Caffey, MD on 07/27/2020 12:16 AM.    pt states vision is still very blurry, he is wearing a patch "24/7", he states he is face down all the time except when eating  Referring physician: Dwain Sarna, [place], [place] [address]  HISTORICAL INFORMATION:   Selected notes from the [facility] Referred by Dr. Dwain Sarna LEE: 03/20/2020 BCVA OD: 20/30 OS: 20/20 Ocular Hx- Myopia, Macular Hole OD, DM without retinopathy PMH- DM    CURRENT MEDICATIONS: Current Outpatient Medications (Ophthalmic Drugs)  Medication Sig   bacitracin-polymyxin b (POLYSPORIN) ophthalmic ointment Place into the right eye at bedtime. Place a 1/2 inch ribbon of ointment into the lower eyelid.   prednisoLONE acetate (PRED FORTE) 1 % ophthalmic suspension Place 1 drop into the right eye 4 (four) times daily.   No current facility-administered medications for this visit. (Ophthalmic Drugs)   Current Outpatient Medications (Other)  Medication Sig   atorvastatin (LIPITOR) 20 MG tablet Take 1 tablet (20 mg total) by mouth daily. (Patient taking differently: Take 20 mg by mouth at bedtime.)   BD HYPODERMIC NEEDLE 16G X 1" MISC FOR USE WITH TESTOSTERONE INJECTIONS   BD HYPODERMIC NEEDLE 18G X 1" MISC USE WITH  TESTOSTERONE INJECTIONS   CINNAMON PO Take 1,000 mg by mouth 2 (two) times daily.    clopidogrel (PLAVIX) 75 MG tablet Take 1 tablet (75 mg total) by mouth daily.   Cyanocobalamin (VITAMIN B-12) 5000 MCG SUBL Place 5,000 mcg under the tongue daily.   fluticasone (FLONASE) 50 MCG/ACT nasal spray USE 2 SPRAYS IN EACH NOSTRIL DAILY AS DIRECTED (Patient taking differently: Place 2 sprays into both nostrils at bedtime.)   glucose blood test strip Use as instructed to check sugars once daily. Dx E11.9   hydrochlorothiazide (HYDRODIURIL) 25 MG tablet Take 1 tablet (25 mg total) by mouth daily as needed. (Patient not taking: Reported on 07/12/2020)   hydrOXYzine (VISTARIL) 50 MG capsule Take 50 mg by mouth 2 (two) times daily.   lamoTRIgine (LAMICTAL) 150 MG tablet Take 150 mg by mouth at bedtime.   lamoTRIgine (LAMICTAL) 200 MG tablet Take 200 mg by mouth daily.   Lancets (ONETOUCH ULTRASOFT) lancets Use as instructed to check sugars once daily. Dx E 11.9   magnesium oxide (MAG-OX) 400 MG tablet Take 400 mg by mouth daily.   metFORMIN (GLUCOPHAGE-XR) 500 MG 24 hr tablet Take 500 mg by mouth every morning.   olmesartan-hydrochlorothiazide (BENICAR HCT) 40-25 MG tablet Take 1 tablet by mouth at bedtime.   pantoprazole (PROTONIX) 40 MG tablet TAKE ONE TABLET BY MOUTH ONE TIME DAILY (Patient taking differently: Take 40 mg by mouth daily.)   polyethylene  glycol powder (GLYCOLAX/MIRALAX) powder TAKE 17 GRAMS BY MOUTH EVERY DAY as needed (as directed) (Patient taking differently: Take 17 g by mouth daily.)   potassium citrate (UROCIT-K) 10 MEQ (1080 MG) SR tablet Take 1 tablet (10 mEq total) by mouth 2 (two) times daily. (Patient taking differently: Take 10 mEq by mouth daily.)   Syringe, Disposable, (B-D SYRINGE LUER-LOK 3CC) 3 ML MISC 1 mL by Does not apply route every 14 (fourteen) days.   SYRINGE-NEEDLE, DISP, 3 ML (B-D 3CC LUER-LOK SYR 22GX1") 22G X 1" 3 ML MISC FOR TESTOSTERONE INJECTIONS   tamsulosin  (FLOMAX) 0.4 MG CAPS capsule Take 1 capsule (0.4 mg total) by mouth daily.   testosterone cypionate (DEPOTESTOSTERONE CYPIONATE) 200 MG/ML injection Inject 0.7 mLs (140 mg total) into the muscle every 7 (seven) days. (Patient taking differently: Inject 140 mg into the muscle every 7 (seven) days. Every Monday)   traZODone (DESYREL) 100 MG tablet Take 200 mg by mouth at bedtime.    venlafaxine XR (EFFEXOR-XR) 150 MG 24 hr capsule Take 150 mg by mouth daily with breakfast.   VITAMIN D, CHOLECALCIFEROL, PO Take 2,000 mg by mouth daily.   VITAMIN E PO Take 2,000 Units by mouth daily.   zinc gluconate 50 MG tablet Take 50 mg by mouth daily.   No current facility-administered medications for this visit. (Other)      REVIEW OF SYSTEMS: ROS   Positive for: Gastrointestinal, Genitourinary, Musculoskeletal, Cardiovascular, Eyes, Respiratory, Psychiatric Negative for: Constitutional, Neurological, Skin, HENT, Endocrine, Allergic/Imm, Heme/Lymph Last edited by Doneen Poisson on 07/26/2020  8:08 AM.       ALLERGIES No Known Allergies  PAST MEDICAL HISTORY Past Medical History:  Diagnosis Date   Acid reflux    Angina 2013   no current problems   Anxiety    Arthritis    Bipolar disorder ([facility])    type 2   Depression    Diabetes mellitus without complication ([facility])    type 2   Diastasis of muscle 2011   "abdominal"   Dysplastic polyp of colon 2008   [facility]   GERD (gastroesophageal reflux disease)    Gout    Multiple episodes - no current problems   Heart disease    History of kidney stones    surgery to remove stones   Hyperlipidemia    Hypertension    Myocardial infarction ([facility]) 05/2011   "we think";  06/2011 abnl mv;  06/2011 Cath - subtotal occlusion of Ramus, otw nonobs dzs;  06/2011 PCI/DES of Ramus w/ 2.25x10m Resolute DES    Sleep apnea    uses CPAP nightly   Syncopal episodes 05/2020   Syncope 05/2020   Treadmill stress test negative for angina pectoris 2011   Past Surgical  History:  Procedure Laterality Date   25 GAUGE PARS PLANA VITRECTOMY WITH 20 GAUGE MVR PORT FOR MACULAR HOLE Right 07/19/2020   Procedure: 25 [place]GE [place]  Surgeon: ZBernarda Caffey MD;  [facility]  Service: Ophthalmology;  Laterality: Right;   CARDIAC CATHETERIZATION  06/2011   "diagnostic"   COLONOSCOPY     CORONARY ANGIOPLASTY WITH STENT PLACEMENT  07/09/11   "1"   GAS/FLUID EXCHANGE Right 07/19/2020   Procedure: GAS/FLUID EXCHANGE;  Surgeon: ZBernarda Caffey MD;  [facility]  Service: Ophthalmology;  Laterality: Right;  c3 f8   HEMORRHOID SURGERY  2000   INGUINAL HERNIA REPAIR  1987   left   LEFT HEART CATHETERIZATION WITH CORONARY ANGIOGRAM  N/A 10/21/2011   Procedure: LEFT HEART CATHETERIZATION WITH CORONARY ANGIOGRAM;  Surgeon: Hillary Bow, MD;  Location: [facility] CATH LAB;  Service: Cardiovascular;  Laterality: N/A;   LITHOTRIPSY  10/2008   MEMBRANE PEEL Right 07/19/2020   Procedure: MEMBRANE PEEL;  Surgeon: Bernarda Caffey, MD;  Location: [facility];  Service: Ophthalmology;  Laterality: Right;   PERCUTANEOUS CORONARY STENT INTERVENTION (PCI-S) N/A 07/09/2011   Procedure: PERCUTANEOUS CORONARY STENT INTERVENTION (PCI-S);  Surgeon: Sherren Mocha, MD;  [facility] LAB;  Service: Cardiovascular;  Laterality: N/A;   PHOTOCOAGULATION WITH LASER Right 07/19/2020   Procedure: PHOTOCOAGULATION WITH LASER;  Surgeon: Bernarda Caffey, MD;  Location: [facility];  Service: Ophthalmology;  Laterality: Right;   UPPER GI ENDOSCOPY     URETEROSCOPY  10/2009    FAMILY HISTORY Family History  Problem Relation Age of Onset   Heart attack Father 37       LVAD   Hypertension Other     SOCIAL HISTORY Social History   Tobacco Use   Smoking status: Former    Packs/day: 0.50    Years: 20.00    Pack years: 10.00    Types: Cigarettes    Quit date: 02/17/1990    Years since quitting: 30.4   Smokeless tobacco: Never  Vaping Use   Vaping Use: Never used  Substance Use Topics    Alcohol use: Not Currently    Alcohol/week: 4.0 standard drinks    Types: 2 Glasses of wine, 2 Cans of beer per week   Drug use: Yes    Frequency: 7.0 times per week    Types: Marijuana    Comment: 07/09/11 "lets say a cigarette a day" Last used         OPHTHALMIC EXAM:  Base Eye Exam     Visual Acuity (Snellen - Linear)       Right Left   Dist [place] CF @ 1' 20/30 -2   Dist ph [place] NI NI         Tonometry (Tonopen, 8:11 AM)       Right Left   Pressure 16 def         Pupils       Dark Light Shape React APD   Right 6 6 Round dilated 0   Left 3 2 Round Minimal 0         Visual Fields       Left Right    Full    Restrictions  Partial outer superior temporal, inferior temporal, superior nasal, inferior nasal deficiencies         Extraocular Movement       Right Left    Full Full         Neuro/Psych     Oriented x3: Yes   Mood/Affect: Normal         Dilation     Right eye: 1.0% Mydriacyl, 2.5% Phenylephrine @ 8:11 AM           Slit Lamp and Fundus Exam     External Exam       Right Left   External Normal Normal         Slit Lamp Exam       Right Left   Lids/Lashes Dermatochalasis - upper lid Dermatochalasis - upper lid   Conjunctiva/Sclera Subconjunctival hemorrhage - improving, pneumatic chemosis temporally - resolved, sutures intact White and quiet   Cornea Mild arcus, 1+PEE, endo pigment Mild arcus, trace PEE   Anterior Chamber Deep and  quiet Deep and quiet   Iris Round and dilated Round and dilated   Lens 2+ Nuclear sclerosis, 2+ Cortical cataract 2+ Nuclear sclerosis, 2+ Cortical cataract   Vitreous Hazy view, post vitrectomy, ~90% gas fill  Vitreous syneresis, Posterior vitreous detachment         Fundus Exam       Right Left   Disc Pink and Sharp, mild tilt, temporal PPA    C/D Ratio 0.3 0.4   Macula Flat under gas, Mac hole closing    Vessels mild attenuation, mild tortuousity    Periphery Attached, 360  peripheral laser changes            Refraction     Wearing Rx       Sphere Cylinder Axis Add   Right -1.25 +1.00 125 +2.50   Left -1.75 +1.00 155 +2.50            IMAGING AND PROCEDURES  Imaging and Procedures for 07/26/2020         ASSESSMENT/PLAN:    ICD-10-CM   1. Macular hole of right eye  H35.341     2. Retinal edema  H35.81 CANCELED: OCT, Retina - OU - Both Eyes    3. Essential hypertension  I10     4. Hypertensive retinopathy of both eyes  H35.033     5. Combined forms of age-related cataract of both eyes  H25.813       1,2. Macular hole, OD  - full thickness macular hole with mild surrounding CME  - initially diagnosed and referred here in Feb 2022  - BCVA 20/50 OD  - now POW1 s/p PPV/TissueBlue/MP/14% C3F8 OD, 06.02.22             - doing well              - retina attached and good gas bubble in place w/ mac hole closing             - IOP okay at 16  - good gas fill             - cont PF 6x/day OD -- decrease to QID                         zymaxid QID OD -- stop Sunday, 06.12.22                         Atropine BID OD -- stop Sunday, 06.12.22   Brimonidine BID OD -- decrease to Qdaily                         PSO ung qhs and prn during day OD              - cont face down positioning 50% of time; avoid laying flat on back              - eye shield when sleeping x1 more week             - post op drop and positioning instructions reviewed  - tylenol/ibuprofen for pain - f/u 3 weeks, DFE, OCT  3,4. Hypertensive retinopathy OU - discussed importance of tight BP control - monitor  5. Mixed Cataract OU - The symptoms of cataract, surgical options, and treatments and risks were discussed with patient. - discussed diagnosis and progression, specifically discussed likelihood of cataract progression post macular hole  repair - monitor  Ophthalmic Meds Ordered this visit:  Meds ordered this encounter  Medications   prednisoLONE acetate (PRED  FORTE) 1 % ophthalmic suspension    Sig: Place 1 drop into the right eye 4 (four) times daily.    Dispense:  15 mL    Refill:  0   bacitracin-polymyxin b (POLYSPORIN) ophthalmic ointment    Sig: Place into the right eye at bedtime. Place a 1/2 inch ribbon of ointment into the lower eyelid.    Dispense:  3.5 g    Refill:  3       Return in about 3 weeks (around 08/16/2020) for f/u mac hole OS, POV, DFE, OCT.  There are no Patient Instructions on file for this visit.   Explained the diagnoses, plan, and follow up with the patient and they expressed understanding.  Patient expressed understanding of the importance of proper follow up care.   This document serves as a record of services personally performed by Gardiner Sleeper, MD, PhD. It was created on their behalf by Leonie Douglas, an ophthalmic technician. The creation of this record is the provider's dictation and/or activities during the visit.    Electronically signed by: Leonie Douglas COA, 07/27/20  12:18 AM  This document serves as a record of services personally performed by Gardiner Sleeper, MD, PhD. It was created on their behalf by San Jetty. Owens Shark, OA an ophthalmic technician. The creation of this record is the provider's dictation and/or activities during the visit.    Electronically signed by: San Jetty. Marguerita Merles 06.09.2022 12:18 AM  Gardiner Sleeper, M.D., Ph.D. Diseases & Surgery of the Retina and [place] 07/26/2020  I have reviewed the above documentation for accuracy and completeness, and I agree with the above. Gardiner Sleeper, M.D., Ph.D. 07/27/20 12:18 AM  Abbreviations: M myopia (nearsighted); A astigmatism; H hyperopia (farsighted); P presbyopia; Mrx spectacle prescription;  CTL contact lenses; OD right eye; OS left eye; OU both eyes  XT exotropia; ET esotropia; PEK punctate epithelial keratitis; PEE punctate epithelial erosions; DES dry eye syndrome; MGD meibomian gland dysfunction;  ATs artificial tears; PFAT's preservative free artificial tears; [place] nuclear sclerotic cataract; PSC posterior subcapsular cataract; ERM epi-retinal membrane; PVD posterior vitreous detachment; RD retinal detachment; DM diabetes mellitus; DR diabetic retinopathy; NPDR non-proliferative diabetic retinopathy; PDR proliferative diabetic retinopathy; CSME clinically significant macular edema; DME diabetic macular edema; dbh dot blot hemorrhages; CWS cotton wool spot; POAG primary open angle glaucoma; C/D cup-to-disc ratio; HVF humphrey visual field; GVF goldmann visual field; OCT optical coherence tomography; IOP intraocular pressure; BRVO Branch retinal vein occlusion; CRVO central retinal vein occlusion; CRAO central retinal artery occlusion; BRAO branch retinal artery occlusion; RT retinal tear; SB scleral buckle; PPV pars plana vitrectomy; VH Vitreous hemorrhage; PRP panretinal laser photocoagulation; IVK intravitreal kenalog; VMT vitreomacular traction; MH Macular hole;  NVD neovascularization of the disc; NVE neovascularization elsewhere; AREDS age related eye disease study; ARMD age related macular degeneration; POAG primary open angle glaucoma; EBMD epithelial/anterior basement membrane dystrophy; ACIOL anterior chamber intraocular lens; IOL intraocular lens; PCIOL posterior chamber intraocular lens; Phaco/IOL phacoemulsification with intraocular lens placement; [place] photorefractive keratectomy; LASIK laser assisted in situ keratomileusis; HTN hypertension; DM diabetes mellitus; COPD chronic obstructive pulmonary disease

## 2020-07-26 ENCOUNTER — Encounter (INDEPENDENT_AMBULATORY_CARE_PROVIDER_SITE_OTHER): Payer: Self-pay | Admitting: Ophthalmology

## 2020-07-26 ENCOUNTER — Other Ambulatory Visit: Payer: Self-pay

## 2020-07-26 ENCOUNTER — Ambulatory Visit (INDEPENDENT_AMBULATORY_CARE_PROVIDER_SITE_OTHER): Payer: BLUE CROSS/BLUE SHIELD | Admitting: Ophthalmology

## 2020-07-26 DIAGNOSIS — H3581 Retinal edema: Secondary | ICD-10-CM

## 2020-07-26 DIAGNOSIS — H35033 Hypertensive retinopathy, bilateral: Secondary | ICD-10-CM

## 2020-07-26 DIAGNOSIS — H35341 Macular cyst, hole, or pseudohole, right eye: Secondary | ICD-10-CM

## 2020-07-26 DIAGNOSIS — H25813 Combined forms of age-related cataract, bilateral: Secondary | ICD-10-CM

## 2020-07-26 DIAGNOSIS — I10 Essential (primary) hypertension: Secondary | ICD-10-CM

## 2020-07-26 MED ORDER — PREDNISOLONE ACETATE 1 % OP SUSP: 1.0000 [drp] | Freq: Four times a day (QID) | OPHTHALMIC | 0 refills | Status: DC

## 2020-07-26 MED ORDER — BACITRACIN-POLYMYXIN B 500-10000 UNIT/GM OP OINT: TOPICAL_OINTMENT | Freq: Every day | OPHTHALMIC | 3 refills | Status: DC

## 2020-08-09 NOTE — Progress Notes (Signed)
[facility] Note  08/13/2020     CHIEF COMPLAINT Patient presents for Retina Follow Up   HISTORY OF PRESENT ILLNESS: Keith Testa. is a 65 y.o. male who presents to the clinic today for:   HPI     Retina Follow Up   Patient presents with  Other.  In right eye.  Duration of 3 weeks.  Since onset it is gradually improving.  I, the attending physician,  performed the HPI with the patient and updated documentation appropriately.        Comments   Pt here for 3 week f/u mac hole repair OD 07/19/20. Pt states vision is much better in OD. No ocular pain or discomfort.       Last edited by Bernarda Caffey, MD on 08/13/2020  8:11 AM.     pt states    Referring physician: Dwain Sarna, [place],  [id number]  HISTORICAL INFORMATION:   Selected notes from the MEDICAL RECORD NUMBER Referred by Dr. Dwain Sarna LEE: 03/20/2020 BCVA OD: 20/30 OS: 20/20 Ocular Hx- Myopia, Macular Hole OD, DM without retinopathy PMH- DM    CURRENT MEDICATIONS: Current Outpatient Medications (Ophthalmic Drugs)  Medication Sig   bacitracin-polymyxin b (POLYSPORIN) ophthalmic ointment Place into the right eye at bedtime. Place a 1/2 inch ribbon of ointment into the lower eyelid.   prednisoLONE acetate (PRED FORTE) 1 % ophthalmic suspension Place 1 drop into the right eye 4 (four) times daily.   No current facility-administered medications for this visit. (Ophthalmic Drugs)   Current Outpatient Medications (Other)  Medication Sig   atorvastatin (LIPITOR) 20 MG tablet Take 1 tablet (20 mg total) by mouth daily. (Patient taking differently: Take 20 mg by mouth at bedtime.)   BD HYPODERMIC NEEDLE 16G X 1" MISC FOR USE WITH TESTOSTERONE INJECTIONS   BD HYPODERMIC NEEDLE 18G X 1" MISC USE WITH TESTOSTERONE INJECTIONS   CINNAMON PO Take 1,000 mg by mouth 2 (two) times daily.    clopidogrel (PLAVIX) 75 MG tablet Take 1 tablet (75 mg total) by mouth  daily.   Cyanocobalamin (VITAMIN B-12) 5000 MCG SUBL Place 5,000 mcg under the tongue daily.   fluticasone (FLONASE) 50 MCG/ACT nasal spray USE 2 SPRAYS IN EACH NOSTRIL DAILY AS DIRECTED (Patient taking differently: Place 2 sprays into both nostrils at bedtime.)   glucose blood test strip Use as instructed to check sugars once daily. Dx E11.9   hydrochlorothiazide (HYDRODIURIL) 25 MG tablet Take 1 tablet (25 mg total) by mouth daily as needed. (Patient not taking: Reported on 07/12/2020)   hydrOXYzine (VISTARIL) 50 MG capsule Take 50 mg by mouth 2 (two) times daily.   lamoTRIgine (LAMICTAL) 150 MG tablet Take 150 mg by mouth at bedtime.   lamoTRIgine (LAMICTAL) 200 MG tablet Take 200 mg by mouth daily.   Lancets (ONETOUCH ULTRASOFT) lancets Use as instructed to check sugars once daily. Dx E 11.9   magnesium oxide (MAG-OX) 400 MG tablet Take 400 mg by mouth daily.   metFORMIN (GLUCOPHAGE-XR) 500 MG 24 hr tablet Take 500 mg by mouth every morning.   olmesartan-hydrochlorothiazide (BENICAR HCT) 40-25 MG tablet Take 1 tablet by mouth at bedtime.   pantoprazole (PROTONIX) 40 MG tablet TAKE ONE TABLET BY MOUTH ONE TIME DAILY (Patient taking differently: Take 40 mg by mouth daily.)   polyethylene glycol powder (GLYCOLAX/MIRALAX) powder TAKE 17 GRAMS BY MOUTH EVERY DAY as needed (as directed) (Patient taking differently: Take 17 g  by mouth daily.)   potassium citrate (UROCIT-K) 10 MEQ (1080 MG) SR tablet Take 1 tablet (10 mEq total) by mouth 2 (two) times daily. (Patient taking differently: Take 10 mEq by mouth daily.)   Syringe, Disposable, (B-D SYRINGE LUER-LOK 3CC) 3 ML MISC 1 mL by Does not apply route every 14 (fourteen) days.   SYRINGE-NEEDLE, DISP, 3 ML (B-D 3CC LUER-LOK SYR 22GX1") 22G X 1" 3 ML MISC FOR TESTOSTERONE INJECTIONS   tamsulosin (FLOMAX) 0.4 MG CAPS capsule Take 1 capsule (0.4 mg total) by mouth daily.   testosterone cypionate (DEPOTESTOSTERONE CYPIONATE) 200 MG/ML injection Inject 0.7  mLs (140 mg total) into the muscle every 7 (seven) days. (Patient taking differently: Inject 140 mg into the muscle every 7 (seven) days. Every Monday)   traZODone (DESYREL) 100 MG tablet Take 200 mg by mouth at bedtime.    venlafaxine XR (EFFEXOR-XR) 150 MG 24 hr capsule Take 150 mg by mouth daily with breakfast.   VITAMIN D, CHOLECALCIFEROL, PO Take 2,000 mg by mouth daily.   VITAMIN E PO Take 2,000 Units by mouth daily.   zinc gluconate 50 MG tablet Take 50 mg by mouth daily.   No current facility-administered medications for this visit. (Other)      REVIEW OF SYSTEMS: ROS   Positive for: Gastrointestinal, Genitourinary, Musculoskeletal, Cardiovascular, Eyes, Respiratory, Psychiatric Negative for: Constitutional, Neurological, Skin, HENT, Endocrine, Allergic/Imm, Heme/Lymph Last edited by Kingsley Spittle, COT on 08/13/2020  7:54 AM.        ALLERGIES No Known Allergies  PAST MEDICAL HISTORY Past Medical History:  Diagnosis Date   Acid reflux    Angina 2013   no current problems   Anxiety    Arthritis    Bipolar disorder ([place])    type 2   Depression    Diabetes mellitus without complication ([place])    type 2   Diastasis of muscle 2011   "abdominal"   Dysplastic polyp of colon 2008   [facility]   GERD (gastroesophageal reflux disease)    Gout    Multiple episodes - no current problems   Heart disease    History of kidney stones    surgery to remove stones   Hyperlipidemia    Hypertension    Myocardial infarction ([place]) 05/2011   "we think";  06/2011 abnl mv;  06/2011 Cath - subtotal occlusion of Ramus, otw nonobs dzs;  06/2011 PCI/DES of Ramus w/ 2.25x61m Resolute DES    Sleep apnea    uses CPAP nightly   Syncopal episodes 05/2020   Syncope 05/2020   Treadmill stress test negative for angina pectoris 2011   Past Surgical History:  Procedure Laterality Date   25 GAUGE PARS PLANA VITRECTOMY WITH 20 GAUGE MVR PORT FOR MACULAR HOLE Right 07/19/2020   Procedure: 25 GAUGE  PARS PLANA VITRECTOMY FOR MACULAR HOLE;  Surgeon: ZBernarda Caffey MD;  [place] [facility]  Service: Ophthalmology;  Laterality: Right;   CARDIAC CATHETERIZATION  06/2011   "diagnostic"   COLONOSCOPY     CORONARY ANGIOPLASTY WITH STENT PLACEMENT  07/09/11   "1"   GAS/FLUID EXCHANGE Right 07/19/2020   Procedure: GAS/FLUID EXCHANGE;  Surgeon: ZBernarda Caffey MD;  [place] [facility]  Service: Ophthalmology;  Laterality: Right;  c3 f8   HEMORRHOID SURGERY  2000   INGUINAL HERNIA REPAIR  1987   left   LEFT HEART CATHETERIZATION WITH CORONARY ANGIOGRAM N/A 10/21/2011   Procedure: LEFT HEART CATHETERIZATION WITH CORONARY ANGIOGRAM;  Surgeon: THillary Bow MD;  Location:  [facility] [place] CATH LAB;  Service: Cardiovascular;  Laterality: N/A;   LITHOTRIPSY  10/2008   MEMBRANE PEEL Right 07/19/2020   Procedure: MEMBRANE PEEL;  Surgeon: Bernarda Caffey, MD;  Location: [place];  Service: Ophthalmology;  Laterality: Right;   PERCUTANEOUS CORONARY STENT INTERVENTION (PCI-S) N/A 07/09/2011   Procedure: PERCUTANEOUS CORONARY STENT INTERVENTION (PCI-S);  Surgeon: Sherren Mocha, MD;  Location[facility] C[facility];  Service: Cardiovascular;  Laterality: N/A;   PHOTOCOAGULATION WITH LASER Right 07/19/2020   Procedure: PHOTOCOAGULATION WITH LASER;  Surgeon: Bernarda Caffey, MD;  Location: [place];  Service: Ophthalmology;  Laterality: Right;   UPPER GI ENDOSCOPY     URETEROSCOPY  10/2009    FAMILY HISTORY Family History  Problem Relation Age of Onset   Heart attack Father 12       LVAD   Hypertension Other     SOCIAL HISTORY Social History   Tobacco Use   Smoking status: Former    Packs/day: 0.50    Years: 20.00    Pack years: 10.00    Types: Cigarettes    Quit date: 02/17/1990    Years since quitting: 30.5   Smokeless tobacco: Never  Vaping Use   Vaping Use: Never used  Substance Use Topics   Alcohol use: Not Currently    Alcohol/week: 4.0 standard drinks    Types: 2 Glasses of wine, 2 Cans of beer per week   Drug use: Yes     Frequency: 7.0 times per week    Types: Marijuana    Comment: 07/09/11 "lets say a cigarette a day" Last used         OPHTHALMIC EXAM:  Base Eye Exam     Visual Acuity (Snellen - Linear)       Right Left   Dist cc 20/50 20/25 -2   Dist ph cc 20/40 +1     Correction: Glasses         Tonometry (Tonopen, 8:04 AM)       Right Left   Pressure 14 10         Pupils       Dark Light Shape React APD   Right 4 4 Round Minimal None   Left 4 3 Round Brisk None         Visual Fields       Left Right   Restrictions  Partial outer superior temporal, inferior temporal, superior nasal, inferior nasal deficiencies         Extraocular Movement       Right Left    Full, Ortho Full, Ortho         Neuro/Psych     Oriented x3: Yes   Mood/Affect: Normal         Dilation     Right eye: 1.0% Mydriacyl, 2.5% Phenylephrine @ 8:04 AM           Slit Lamp and Fundus Exam     External Exam       Right Left   External Normal Normal         Slit Lamp Exam       Right Left   Lids/Lashes Dermatochalasis - upper lid Dermatochalasis - upper lid   Conjunctiva/Sclera White and quiet, focal injection around dissolving sutures White and quiet   Cornea Mild arcus, 1-2+ inferior PEE Mild arcus, trace PEE   Anterior Chamber Deep and quiet Deep and quiet   Iris Round and dilated Round and dilated   Lens 2+ Nuclear sclerosis, 2+ Cortical  cataract, trace Posterior subcapsular cataract 2+ Nuclear sclerosis, 2+ Cortical cataract   Vitreous Hazy view, post vitrectomy, ~45% gas fill  Vitreous syneresis, Posterior vitreous detachment         Fundus Exam       Right Left   Disc Pink and Sharp, mild tilt, temporal PPA    C/D Ratio 0.3 0.4   Macula Flat, mac hole closed    Vessels mild attenuation, mild tortuousity    Periphery Attached, 360 peripheral laser changes            Refraction     Wearing Rx       Sphere Cylinder Axis Add   Right -1.25 +1.00 125  +2.50   Left -1.75 +1.00 155 +2.50            IMAGING AND PROCEDURES  Imaging and Procedures for 08/13/2020  OCT, Retina - OU - Both Eyes       Right Eye Quality was good. Central Foveal Thickness: 305. Progression has improved. Findings include abnormal foveal contour, intraretinal fluid, no SRF, macular hole (Mac hole closed, trace central SRF).   Left Eye Quality was good. Central Foveal Thickness: 338. Progression has been stable. Findings include abnormal foveal contour, intraretinal fluid, no SRF, epiretinal membrane (Foveal notch with central cystic changes).   Notes *Images captured and stored on drive  Diagnosis / Impression:  OD: Mac hole closed, trace central SRF OS: ERM with central cystic changes  Clinical management:  See below  Abbreviations: NFP - Normal foveal profile. CME - cystoid macular edema. PED - pigment epithelial detachment. IRF - intraretinal fluid. SRF - subretinal fluid. EZ - ellipsoid zone. ERM - epiretinal membrane. ORA - outer retinal atrophy. ORT - outer retinal tubulation. SRHM - subretinal hyper-reflective material. IRHM - intraretinal hyper-reflective material            ASSESSMENT/PLAN:    ICD-10-CM   1. Macular hole of right eye  H35.341     2. Retinal edema  H35.81 OCT, Retina - OU - Both Eyes    3. Essential hypertension  I10     4. Hypertensive retinopathy of both eyes  H35.033     5. Combined forms of age-related cataract of both eyes  H25.813        1,2. Macular hole, OD  - full thickness macular hole with mild surrounding CME  - initially diagnosed and referred here in Feb 2022  - BCVA 20/50 OD  - now POW3 s/p PPV/TissueBlue/MP/14% C3F8 OD, 06.02.22             - doing well              - retina attached and good gas bubble in place w/ mac hole closed  - OCT shows mac hole closed and tr central SRF             - IOP okay at 14  - 45% gas fill             - cont PF QID OD -- decrease to TID   Brimonidine  Qdaily OD -- okay to stop                         PSO ung qhs and prn during day OD -- okay to stop             - cont face down positioning 50% of time; avoid laying flat on back              -  post op drop and positioning instructions reviewed  - tylenol/ibuprofen for pain - f/u 3-4 weeks, DFE, OCT  3,4. Hypertensive retinopathy OU - discussed importance of tight BP control - monitor   5. Mixed Cataract OU - The symptoms of cataract, surgical options, and treatments and risks were discussed with patient. - discussed diagnosis and progression, specifically discussed likelihood of cataract progression post macular hole repair - monitor   Ophthalmic Meds Ordered this visit:  No orders of the defined types were placed in this encounter.      Return for f/u 3-4 weeks, mac hole OD, DFE, OCT.  There are no Patient Instructions on file for this visit.   Explained the diagnoses, plan, and follow up with the patient and they expressed understanding.  Patient expressed understanding of the importance of proper follow up care.   This document serves as a record of services personally performed by Gardiner Sleeper, MD, PhD. It was created on their behalf by Leonie Douglas, an ophthalmic technician. The creation of this record is the provider's dictation and/or activities during the visit.    Electronically signed by: Leonie Douglas COA, 08/15/20  10:21 PM  This document serves as a record of services personally performed by Gardiner Sleeper, MD, PhD. It was created on their behalf by San Jetty. Owens Shark, OA an ophthalmic technician. The creation of this record is the provider's dictation and/or activities during the visit.    Electronically signed by: San Jetty. Marguerita Merles 06.27.2022 10:21 PM  Gardiner Sleeper, M.D., Ph.D. Diseases & Surgery of the Retina and [place] 08/13/2020  I have reviewed the above documentation for accuracy and completeness, and I agree  with the above. Gardiner Sleeper, M.D., Ph.D. 08/15/20 10:21 PM   Abbreviations: M myopia (nearsighted); A astigmatism; H hyperopia (farsighted); P presbyopia; Mrx spectacle prescription;  CTL contact lenses; OD right eye; OS left eye; OU both eyes  XT exotropia; ET esotropia; PEK punctate epithelial keratitis; PEE punctate epithelial erosions; DES dry eye syndrome; MGD meibomian gland dysfunction; ATs artificial tears; PFAT's preservative free artificial tears; [place] nuclear sclerotic cataract; PSC posterior subcapsular cataract; ERM epi-retinal membrane; PVD posterior vitreous detachment; RD retinal detachment; DM diabetes mellitus; DR diabetic retinopathy; NPDR non-proliferative diabetic retinopathy; PDR proliferative diabetic retinopathy; CSME clinically significant macular edema; DME diabetic macular edema; dbh dot blot hemorrhages; CWS cotton wool spot; POAG primary open angle glaucoma; C/D cup-to-disc ratio; HVF humphrey visual field; GVF goldmann visual field; OCT optical coherence tomography; IOP intraocular pressure; BRVO Branch retinal vein occlusion; CRVO central retinal vein occlusion; CRAO central retinal artery occlusion; BRAO branch retinal artery occlusion; RT retinal tear; SB scleral buckle; PPV pars plana vitrectomy; VH Vitreous hemorrhage; PRP panretinal laser photocoagulation; IVK intravitreal kenalog; VMT vitreomacular traction; MH Macular hole;  NVD neovascularization of the disc; NVE neovascularization elsewhere; AREDS age related eye disease study; ARMD age related macular degeneration; POAG primary open angle glaucoma; EBMD epithelial/anterior basement membrane dystrophy; ACIOL anterior chamber intraocular lens; IOL intraocular lens; PCIOL posterior chamber intraocular lens; Phaco/IOL phacoemulsification with intraocular lens placement; [place] photorefractive keratectomy; LASIK laser assisted in situ keratomileusis; HTN hypertension; DM diabetes mellitus; COPD chronic obstructive pulmonary  disease

## 2020-08-13 ENCOUNTER — Encounter (INDEPENDENT_AMBULATORY_CARE_PROVIDER_SITE_OTHER): Payer: Self-pay | Admitting: Ophthalmology

## 2020-08-13 ENCOUNTER — Other Ambulatory Visit: Payer: Self-pay

## 2020-08-13 ENCOUNTER — Ambulatory Visit (INDEPENDENT_AMBULATORY_CARE_PROVIDER_SITE_OTHER): Payer: BC Managed Care – PPO | Admitting: Ophthalmology

## 2020-08-13 DIAGNOSIS — I10 Essential (primary) hypertension: Secondary | ICD-10-CM

## 2020-08-13 DIAGNOSIS — H3581 Retinal edema: Secondary | ICD-10-CM | POA: Diagnosis not present

## 2020-08-13 DIAGNOSIS — H35033 Hypertensive retinopathy, bilateral: Secondary | ICD-10-CM

## 2020-08-13 DIAGNOSIS — H25813 Combined forms of age-related cataract, bilateral: Secondary | ICD-10-CM

## 2020-08-13 DIAGNOSIS — H35341 Macular cyst, hole, or pseudohole, right eye: Secondary | ICD-10-CM

## 2020-09-04 ENCOUNTER — Other Ambulatory Visit: Payer: Self-pay

## 2020-09-04 ENCOUNTER — Encounter (INDEPENDENT_AMBULATORY_CARE_PROVIDER_SITE_OTHER): Payer: Self-pay | Admitting: Ophthalmology

## 2020-09-04 ENCOUNTER — Ambulatory Visit (INDEPENDENT_AMBULATORY_CARE_PROVIDER_SITE_OTHER): Payer: BC Managed Care – PPO | Admitting: Ophthalmology

## 2020-09-04 DIAGNOSIS — H3581 Retinal edema: Secondary | ICD-10-CM

## 2020-09-04 DIAGNOSIS — H35341 Macular cyst, hole, or pseudohole, right eye: Secondary | ICD-10-CM | POA: Diagnosis not present

## 2020-09-04 DIAGNOSIS — H25813 Combined forms of age-related cataract, bilateral: Secondary | ICD-10-CM

## 2020-09-04 DIAGNOSIS — H35033 Hypertensive retinopathy, bilateral: Secondary | ICD-10-CM

## 2020-09-04 DIAGNOSIS — I10 Essential (primary) hypertension: Secondary | ICD-10-CM | POA: Diagnosis not present

## 2020-09-04 NOTE — Progress Notes (Signed)
[name] Note  09/04/2020     CHIEF COMPLAINT Patient presents for Retina Follow Up   HISTORY OF PRESENT ILLNESS: Keith Gross. is a 65 y.o. male who presents to the clinic today for:   HPI     Retina Follow Up   Patient presents with  Other.  In right eye.  Duration of 3 months.  Since onset it is gradually improving.  I, the attending physician,  performed the HPI with the patient and updated documentation appropriately.        Comments   Pt here for 3 wk ret f/u mac hole OD. Pt states vision is improving. The size of the bubble has decreased to a small bean size. He states hes 90% improved with vision. No ocular pain or discomfort.       Last edited by Bernarda Caffey, MD on 09/04/2020  5:01 PM.    Patient states bubble getting smaller. "Small bean size now."   Referring physician: Dwain Sarna, [address] [place], [place] [address]  HISTORICAL INFORMATION:   Selected notes from the MEDICAL RECORD NUMBER Referred by Dr. Dwain Sarna LEE: 03/20/2020 BCVA OD: 20/30 OS: 20/20 Ocular Hx- Myopia, Macular Hole OD, DM without retinopathy PMH- DM   CURRENT MEDICATIONS: Current Outpatient Medications (Ophthalmic Drugs)  Medication Sig   bacitracin-polymyxin b (POLYSPORIN) ophthalmic ointment Place into the right eye at bedtime. Place a 1/2 inch ribbon of ointment into the lower eyelid.   prednisoLONE acetate (PRED FORTE) 1 % ophthalmic suspension Place 1 drop into the right eye 4 (four) times daily.   No current facility-administered medications for this visit. (Ophthalmic Drugs)   Current Outpatient Medications (Other)  Medication Sig   atorvastatin (LIPITOR) 20 MG tablet Take 1 tablet (20 mg total) by mouth daily. (Patient taking differently: Take 20 mg by mouth at bedtime.)   BD HYPODERMIC NEEDLE 16G X 1" MISC FOR USE WITH TESTOSTERONE INJECTIONS   BD HYPODERMIC NEEDLE 18G X 1" MISC USE WITH TESTOSTERONE INJECTIONS    CINNAMON PO Take 1,000 mg by mouth 2 (two) times daily.    clopidogrel (PLAVIX) 75 MG tablet Take 1 tablet (75 mg total) by mouth daily.   Cyanocobalamin (VITAMIN B-12) 5000 MCG SUBL Place 5,000 mcg under the tongue daily.   fluticasone (FLONASE) 50 MCG/ACT nasal spray USE 2 SPRAYS IN EACH NOSTRIL DAILY AS DIRECTED (Patient taking differently: Place 2 sprays into both nostrils at bedtime.)   glucose blood test strip Use as instructed to check sugars once daily. Dx E11.9   hydrochlorothiazide (HYDRODIURIL) 25 MG tablet Take 1 tablet (25 mg total) by mouth daily as needed. (Patient not taking: Reported on 07/12/2020)   hydrOXYzine (VISTARIL) 50 MG capsule Take 50 mg by mouth 2 (two) times daily.   lamoTRIgine (LAMICTAL) 150 MG tablet Take 150 mg by mouth at bedtime.   lamoTRIgine (LAMICTAL) 200 MG tablet Take 200 mg by mouth daily.   Lancets (ONETOUCH ULTRASOFT) lancets Use as instructed to check sugars once daily. Dx E 11.9   magnesium oxide (MAG-OX) 400 MG tablet Take 400 mg by mouth daily.   metFORMIN (GLUCOPHAGE-XR) 500 MG 24 hr tablet Take 500 mg by mouth every morning.   olmesartan-hydrochlorothiazide (BENICAR HCT) 40-25 MG tablet Take 1 tablet by mouth at bedtime.   pantoprazole (PROTONIX) 40 MG tablet TAKE ONE TABLET BY MOUTH ONE TIME DAILY (Patient taking differently: Take 40 mg by mouth daily.)   polyethylene glycol powder (  GLYCOLAX/MIRALAX) powder TAKE 17 GRAMS BY MOUTH EVERY DAY as needed (as directed) (Patient taking differently: Take 17 g by mouth daily.)   potassium citrate (UROCIT-K) 10 MEQ (1080 MG) SR tablet Take 1 tablet (10 mEq total) by mouth 2 (two) times daily. (Patient taking differently: Take 10 mEq by mouth daily.)   Syringe, Disposable, (B-D SYRINGE LUER-LOK 3CC) 3 ML MISC 1 mL by Does not apply route every 14 (fourteen) days.   SYRINGE-NEEDLE, DISP, 3 ML (B-D 3CC LUER-LOK SYR 22GX1") 22G X 1" 3 ML MISC FOR TESTOSTERONE INJECTIONS   tamsulosin (FLOMAX) 0.4 MG CAPS capsule  Take 1 capsule (0.4 mg total) by mouth daily.   testosterone cypionate (DEPOTESTOSTERONE CYPIONATE) 200 MG/ML injection Inject 0.7 mLs (140 mg total) into the muscle every 7 (seven) days. (Patient taking differently: Inject 140 mg into the muscle every 7 (seven) days. Every Monday)   traZODone (DESYREL) 100 MG tablet Take 200 mg by mouth at bedtime.    venlafaxine XR (EFFEXOR-XR) 150 MG 24 hr capsule Take 150 mg by mouth daily with breakfast.   VITAMIN D, CHOLECALCIFEROL, PO Take 2,000 mg by mouth daily.   VITAMIN E PO Take 2,000 Units by mouth daily.   zinc gluconate 50 MG tablet Take 50 mg by mouth daily.   No current facility-administered medications for this visit. (Other)      REVIEW OF SYSTEMS: ROS   Positive for: Gastrointestinal, Genitourinary, Musculoskeletal, Cardiovascular, Eyes, Respiratory, Psychiatric Negative for: Constitutional, Neurological, Skin, HENT, Endocrine, Allergic/Imm, Heme/Lymph Last edited by Kingsley Spittle, COT on 09/04/2020  2:14 PM.         ALLERGIES No Known Allergies  PAST MEDICAL HISTORY Past Medical History:  Diagnosis Date   Acid reflux    Angina 2013   no current problems   Anxiety    Arthritis    Bipolar disorder ([place])    type 2   Depression    Diabetes mellitus without complication ([place])    type 2   Diastasis of muscle 2011   "abdominal"   Dysplastic polyp of colon 2008   [facility]   GERD (gastroesophageal reflux disease)    Gout    Multiple episodes - no current problems   Heart disease    History of kidney stones    surgery to remove stones   Hyperlipidemia    Hypertension    Myocardial infarction ([place]) 05/2011   "we think";  06/2011 abnl mv;  06/2011 Cath - subtotal occlusion of Ramus, otw nonobs dzs;  06/2011 PCI/DES of Ramus w/ 2.25x82m Resolute DES    Sleep apnea    uses CPAP nightly   Syncopal episodes 05/2020   Syncope 05/2020   Treadmill stress test negative for angina pectoris 2011   Past Surgical History:   Procedure Laterality Date   25 GAUGE PARS PLANA VITRECTOMY WITH 20 GAUGE MVR PORT FOR MACULAR HOLE Right 07/19/2020   Procedure: 25 GAUGE [place]  Surgeon: ZBernarda Caffey MD;  [place] [facility]  Service: Ophthalmology;  Laterality: Right;   CARDIAC CATHETERIZATION  06/2011   "diagnostic"   COLONOSCOPY     CORONARY ANGIOPLASTY WITH STENT PLACEMENT  07/09/11   "1"   GAS/FLUID EXCHANGE Right 07/19/2020   Procedure: GAS/FLUID EXCHANGE;  Surgeon: ZBernarda Caffey MD;  [place] [facility]  Service: Ophthalmology;  Laterality: Right;  c3 f8   HEMORRHOID SURGERY  2000   INGUINAL HERNIA REPAIR  1987   left   LEFT HEART CATHETERIZATION WITH CORONARY  ANGIOGRAM N/A 10/21/2011   Procedure: LEFT HEART CATHETERIZATION WITH CORONARY ANGIOGRAM;  Surgeon: Hillary Bow, MD;  Location: [facility] CATH LAB;  Service: Cardiovascular;  Laterality: N/A;   LITHOTRIPSY  10/2008   MEMBRANE PEEL Right 07/19/2020   Procedure: MEMBRANE PEEL;  Surgeon: Bernarda Caffey, MD;  Location: [place];  Service: Ophthalmology;  Laterality: Right;   PERCUTANEOUS CORONARY STENT INTERVENTION (PCI-S) N/A 07/09/2011   Procedure: PERCUTANEOUS CORONARY STENT INTERVENTION (PCI-S);  Surgeon: Sherren Mocha, MD;  Location[facility] LAB;  Service: Cardiovascular;  Laterality: N/A;   PHOTOCOAGULATION WITH LASER Right 07/19/2020   Procedure: PHOTOCOAGULATION WITH LASER;  Surgeon: Bernarda Caffey, MD;  Location: [place];  Service: Ophthalmology;  Laterality: Right;   UPPER GI ENDOSCOPY     URETEROSCOPY  10/2009    FAMILY HISTORY Family History  Problem Relation Age of Onset   Heart attack Father 22       LVAD   Hypertension Other     SOCIAL HISTORY Social History   Tobacco Use   Smoking status: Former    Packs/day: 0.50    Years: 20.00    Pack years: 10.00    Types: Cigarettes    Quit date: 02/17/1990    Years since quitting: 30.5   Smokeless tobacco: Never  Vaping Use   Vaping Use: Never used  Substance Use Topics    Alcohol use: Not Currently    Alcohol/week: 4.0 standard drinks    Types: 2 Glasses of wine, 2 Cans of beer per week   Drug use: Yes    Frequency: 7.0 times per week    Types: Marijuana    Comment: 07/09/11 "lets say a cigarette a day" Last used         OPHTHALMIC EXAM:  Base Eye Exam     Visual Acuity (Snellen - Linear)       Right Left   Dist cc 20/80 20/25 -2   Dist ph cc 20/50 +2          Tonometry (Tonopen, 2:26 PM)       Right Left   Pressure 19 12         Pupils       Dark Light Shape React APD   Right 4 3 Round Minimal None   Left 4 3 Round Brisk None         Visual Fields       Left Right    Full    Restrictions  Partial inner inferior temporal, inferior nasal deficiencies         Extraocular Movement       Right Left    Full, Ortho Full, Ortho         Neuro/Psych     Oriented x3: Yes   Mood/Affect: Normal         Dilation     Both eyes: 1.0% Mydriacyl, 2.5% Phenylephrine @ 2:26 PM           Slit Lamp and Fundus Exam     External Exam       Right Left   External Normal Normal         Slit Lamp Exam       Right Left   Lids/Lashes Dermatochalasis - upper lid Dermatochalasis - upper lid   Conjunctiva/Sclera White and quiet White and quiet   Cornea Mild arcus, 1+ inferior PEE Mild arcus, trace PEE   Anterior Chamber Deep and quiet Deep and quiet   Iris Round and dilated  Round and dilated   Lens 2-3+ Nuclear sclerosis with early brunescense, 2-3+ Cortical cataract, trace Posterior subcapsular cataract 2+ Nuclear sclerosis, 2+ Cortical cataract   Vitreous post vitrectomy, ~5% gas fill  Vitreous syneresis, Posterior vitreous detachment         Fundus Exam       Right Left   Disc Pink and Sharp, mild tilt, compact, temporal PPA    C/D Ratio 0.3 0.4   Macula Flat, mac hole closed, no heme    Vessels mild attenuation, mild tortuousity    Periphery Attached, 360 peripheral laser changes             Refraction     Wearing Rx       Sphere Cylinder Axis Add   Right -1.25 +1.00 125 +2.50   Left -1.75 +1.00 155 +2.50            IMAGING AND PROCEDURES  Imaging and Procedures for 09/04/2020  OCT, Retina - OU - Both Eyes       Right Eye Quality was good. Central Foveal Thickness: 298. Progression has been stable. Findings include abnormal foveal contour, no IRF, subretinal fluid (Mac hole closed, trace central SRF).   Left Eye Quality was good. Central Foveal Thickness: 342. Progression has been stable. Findings include abnormal foveal contour, intraretinal fluid, no SRF, epiretinal membrane (Foveal notch with central cystic changes).   Notes *Images captured and stored on drive  Diagnosis / Impression:  OD: Mac hole closed, trace central SRF OS: ERM with central cystic changes  Clinical management:  See below  Abbreviations: NFP - Normal foveal profile. CME - cystoid macular edema. PED - pigment epithelial detachment. IRF - intraretinal fluid. SRF - subretinal fluid. EZ - ellipsoid zone. ERM - epiretinal membrane. ORA - outer retinal atrophy. ORT - outer retinal tubulation. SRHM - subretinal hyper-reflective material. IRHM - intraretinal hyper-reflective material             ASSESSMENT/PLAN:    ICD-10-CM   1. Macular hole of right eye  H35.341     2. Retinal edema  H35.81 OCT, Retina - OU - Both Eyes    3. Essential hypertension  I10     4. Hypertensive retinopathy of both eyes  H35.033     5. Combined forms of age-related cataract of both eyes  H25.813         1,2. Macular hole, OD  - full thickness macular hole with mild surrounding CME  - initially diagnosed and referred here in Feb 2022  - pre op BCVA -- 20/50-2 OD  - now POW6 s/p PPV/TissueBlue/MP/14% C3F8 OD, 06.02.22             - doing well              - retina attached and good gas bubble in place w/ mac hole closed  - OCT shows mac hole closed and tr central SRF             - IOP okay  at 14  - 5% gas fill             - cont PF taper -- 3,2,1 drops daily, decrease weekly  - avoid laying flat on back              - post op drop and positioning instructions reviewed  - tylenol/ibuprofen for pain - f/u 3-4 weeks, DFE, OCT  3,4. Hypertensive retinopathy OU - discussed importance of tight BP control -  monitor   5. Mixed Cataract OU - The symptoms of cataract, surgical options, and treatments and risks were discussed with patient. - discussed diagnosis and progression, specifically discussed likelihood of cataract progression post macular hole repair - mild progression noted OD today - monitor   Ophthalmic Meds Ordered this visit:  No orders of the defined types were placed in this encounter.      Return 3-4 weeks, for DFE, OCT.  There are no Patient Instructions on file for this visit.   Explained the diagnoses, plan, and follow up with the patient and they expressed understanding.  Patient expressed understanding of the importance of proper follow up care.   This document serves as a record of services personally performed by Gardiner Sleeper, MD, PhD. It was created on their behalf by Estill Bakes, COT an ophthalmic technician. The creation of this record is the provider's dictation and/or activities during the visit.    Electronically signed by: Estill Bakes, COT 7.19.22 @ 5:05 PM   This document serves as a record of services personally performed by Gardiner Sleeper, MD, PhD. It was created on their behalf by Roselee Nova, COMT. The creation of this record is the provider's dictation and/or activities during the visit.  Electronically signed by: Roselee Nova, COMT 09/04/20 5:05 PM  Gardiner Sleeper, M.D., Ph.D. Diseases & Surgery of the Retina and Vitreous [facility]  I have reviewed the above documentation for accuracy and completeness, and I agree with the above. Gardiner Sleeper, M.D., Ph.D. 09/04/20 5:05 PM   Abbreviations: M  myopia (nearsighted); A astigmatism; H hyperopia (farsighted); P presbyopia; Mrx spectacle prescription;  CTL contact lenses; OD right eye; OS left eye; OU both eyes  XT exotropia; ET esotropia; PEK punctate epithelial keratitis; PEE punctate epithelial erosions; DES dry eye syndrome; MGD meibomian gland dysfunction; ATs artificial tears; PFAT's preservative free artificial tears; [place] nuclear sclerotic cataract; PSC posterior subcapsular cataract; ERM epi-retinal membrane; PVD posterior vitreous detachment; RD retinal detachment; DM diabetes mellitus; DR diabetic retinopathy; NPDR non-proliferative diabetic retinopathy; PDR proliferative diabetic retinopathy; CSME clinically significant macular edema; DME diabetic macular edema; dbh dot blot hemorrhages; CWS cotton wool spot; POAG primary open angle glaucoma; C/D cup-to-disc ratio; HVF humphrey visual field; GVF goldmann visual field; OCT optical coherence tomography; IOP intraocular pressure; BRVO Branch retinal vein occlusion; CRVO central retinal vein occlusion; CRAO central retinal artery occlusion; BRAO branch retinal artery occlusion; RT retinal tear; SB scleral buckle; PPV pars plana vitrectomy; VH Vitreous hemorrhage; PRP panretinal laser photocoagulation; IVK intravitreal kenalog; VMT vitreomacular traction; MH Macular hole;  NVD neovascularization of the disc; NVE neovascularization elsewhere; AREDS age related eye disease study; ARMD age related macular degeneration; POAG primary open angle glaucoma; EBMD epithelial/anterior basement membrane dystrophy; ACIOL anterior chamber intraocular lens; IOL intraocular lens; PCIOL posterior chamber intraocular lens; Phaco/IOL phacoemulsification with intraocular lens placement; [place] photorefractive keratectomy; LASIK laser assisted in situ keratomileusis; HTN hypertension; DM diabetes mellitus; COPD chronic obstructive pulmonary disease

## 2020-09-07 ENCOUNTER — Ambulatory Visit: Payer: Self-pay | Admitting: Urology

## 2020-09-18 ENCOUNTER — Other Ambulatory Visit: Payer: Self-pay | Admitting: Internal Medicine

## 2020-09-26 ENCOUNTER — Encounter: Payer: Self-pay | Admitting: Internal Medicine

## 2020-09-26 ENCOUNTER — Other Ambulatory Visit: Payer: Self-pay | Admitting: Internal Medicine

## 2020-09-27 ENCOUNTER — Other Ambulatory Visit: Payer: Self-pay | Admitting: Internal Medicine

## 2020-09-28 NOTE — Progress Notes (Signed)
[facility] Note  10/03/2020     CHIEF COMPLAINT Patient presents for Retina Follow Up   HISTORY OF PRESENT ILLNESS: Keith Gross. is a 65 y.o. male who presents to the clinic today for:   HPI     Retina Follow Up   Patient presents with  Other.  In right eye.  Duration of 4 weeks.  Since onset it is stable.  I, the attending physician,  performed the HPI with the patient and updated documentation appropriately.        Comments   Pt here for 4 wk ret f/u for macular hole OD. Pt states vision has improved, happy with result. No changes, no ocular pain.       Last edited by Bernarda Caffey, MD on 10/03/2020 12:29 PM.    Pt states gas bubble is gone, he is off all drops, vision has improved    Referring physician: Dwain Sarna, [place], [place] [id number]  HISTORICAL INFORMATION:   Selected notes from the MEDICAL RECORD NUMBER Referred by Dr. Dwain Sarna LEE: 03/20/2020 BCVA OD: 20/30 OS: 20/20 Ocular Hx- Myopia, Macular Hole OD, DM without retinopathy PMH- DM   CURRENT MEDICATIONS: Current Outpatient Medications (Ophthalmic Drugs)  Medication Sig   bacitracin-polymyxin b (POLYSPORIN) ophthalmic ointment Place into the right eye at bedtime. Place a 1/2 inch ribbon of ointment into the lower eyelid.   prednisoLONE acetate (PRED FORTE) 1 % ophthalmic suspension Place 1 drop into the right eye 4 (four) times daily.   No current facility-administered medications for this visit. (Ophthalmic Drugs)   Current Outpatient Medications (Other)  Medication Sig   atorvastatin (LIPITOR) 20 MG tablet Take 1 tablet (20 mg total) by mouth daily. (Patient taking differently: Take 20 mg by mouth at bedtime.)   BD HYPODERMIC NEEDLE 16G X 1" MISC FOR USE WITH TESTOSTERONE INJECTIONS   BD HYPODERMIC NEEDLE 18G X 1" MISC USE WITH TESTOSTERONE INJECTIONS   CINNAMON PO Take 1,000 mg by mouth 2 (two) times daily.    clopidogrel (PLAVIX) 75  MG tablet Take 1 tablet (75 mg total) by mouth daily.   Cyanocobalamin (VITAMIN B-12) 5000 MCG SUBL Place 5,000 mcg under the tongue daily.   fluticasone (FLONASE) 50 MCG/ACT nasal spray USE 2 SPRAYS IN EACH NOSTRIL DAILY AS DIRECTED (Patient taking differently: Place 2 sprays into both nostrils at bedtime.)   glucose blood test strip Use as instructed to check sugars once daily. Dx E11.9   hydrochlorothiazide (HYDRODIURIL) 25 MG tablet Take 1 tablet (25 mg total) by mouth daily as needed. (Patient not taking: Reported on 07/12/2020)   hydrOXYzine (VISTARIL) 50 MG capsule Take 50 mg by mouth 2 (two) times daily.   lamoTRIgine (LAMICTAL) 150 MG tablet Take 150 mg by mouth at bedtime.   lamoTRIgine (LAMICTAL) 200 MG tablet Take 200 mg by mouth daily.   Lancets (ONETOUCH ULTRASOFT) lancets Use as instructed to check sugars once daily. Dx E 11.9   magnesium oxide (MAG-OX) 400 MG tablet Take 400 mg by mouth daily.   metFORMIN (GLUCOPHAGE-XR) 500 MG 24 hr tablet TAKE ONE TABLET BY MOUTH ONE TIME DAILY WITH BREAKFAST   olmesartan-hydrochlorothiazide (BENICAR HCT) 40-25 MG tablet Take 1 tablet by mouth at bedtime.   pantoprazole (PROTONIX) 40 MG tablet TAKE ONE TABLET BY MOUTH ONE TIME DAILY   polyethylene glycol powder (GLYCOLAX/MIRALAX) powder TAKE 17 GRAMS BY MOUTH EVERY DAY as needed (as directed) (Patient taking differently: Take  17 g by mouth daily.)   potassium citrate (UROCIT-K) 10 MEQ (1080 MG) SR tablet TAKE ONE TABLET BY MOUTH TWICE A DAY   Syringe, Disposable, (B-D SYRINGE LUER-LOK 3CC) 3 ML MISC 1 mL by Does not apply route every 14 (fourteen) days.   SYRINGE-NEEDLE, DISP, 3 ML (B-D 3CC LUER-LOK SYR 22GX1") 22G X 1" 3 ML MISC FOR TESTOSTERONE INJECTIONS   tamsulosin (FLOMAX) 0.4 MG CAPS capsule TAKE ONE CAPSULE BY MOUTH ONE TIME DAILY   testosterone cypionate (DEPOTESTOSTERONE CYPIONATE) 200 MG/ML injection Inject 0.7 mLs (140 mg total) into the muscle every 7 (seven) days. (Patient taking  differently: Inject 140 mg into the muscle every 7 (seven) days. Every Monday)   traZODone (DESYREL) 100 MG tablet Take 200 mg by mouth at bedtime.    venlafaxine XR (EFFEXOR-XR) 150 MG 24 hr capsule Take 150 mg by mouth daily with breakfast.   VITAMIN D, CHOLECALCIFEROL, PO Take 2,000 mg by mouth daily.   VITAMIN E PO Take 2,000 Units by mouth daily.   zinc gluconate 50 MG tablet Take 50 mg by mouth daily.   No current facility-administered medications for this visit. (Other)   REVIEW OF SYSTEMS: ROS   Positive for: Gastrointestinal, Genitourinary, Musculoskeletal, Cardiovascular, Eyes, Respiratory, Psychiatric Negative for: Constitutional, Neurological, Skin, HENT, Endocrine, Allergic/Imm, Heme/Lymph Last edited by Kingsley Spittle, COT on 10/03/2020  9:11 AM.     ALLERGIES No Known Allergies  PAST MEDICAL HISTORY Past Medical History:  Diagnosis Date   Acid reflux    Angina 2013   no current problems   Anxiety    Arthritis    Bipolar disorder ([place])    type 2   Depression    Diabetes mellitus without complication ([place])    type 2   Diastasis of muscle 2011   "abdominal"   Dysplastic polyp of colon 2008   [facility]   GERD (gastroesophageal reflux disease)    Gout    Multiple episodes - no current problems   Heart disease    History of kidney stones    surgery to remove stones   Hyperlipidemia    Hypertension    Myocardial infarction ([place]) 05/2011   "we think";  06/2011 abnl mv;  06/2011 Cath - subtotal occlusion of Ramus, otw nonobs dzs;  06/2011 PCI/DES of Ramus w/ 2.25x42m Resolute DES    Sleep apnea    uses CPAP nightly   Syncopal episodes 05/2020   Syncope 05/2020   Treadmill stress test negative for angina pectoris 2011   Past Surgical History:  Procedure Laterality Date   25 GAUGE PARS PLANA VITRECTOMY WITH 20 GAUGE MVR PORT FOR MACULAR HOLE Right 07/19/2020   Procedure: 25 GAUGE [place]  Surgeon: ZBernarda Caffey MD;  [facility] [facility]   Service: Ophthalmology;  Laterality: Right;   CARDIAC CATHETERIZATION  06/2011   "diagnostic"   COLONOSCOPY     CORONARY ANGIOPLASTY WITH STENT PLACEMENT  07/09/11   "1"   GAS/FLUID EXCHANGE Right 07/19/2020   Procedure: GAS/FLUID EXCHANGE;  Surgeon: ZBernarda Caffey MD;  [place] [facility]  Service: Ophthalmology;  Laterality: Right;  c3 f8   HEMORRHOID SURGERY  2000   INGUINAL HERNIA REPAIR  1987   left   LEFT HEART CATHETERIZATION WITH CORONARY ANGIOGRAM N/A 10/21/2011   Procedure: LEFT HEART CATHETERIZATION WITH CORONARY ANGIOGRAM;  Surgeon: THillary Bow MD;  [facility];  Service: Cardiovascular;  Laterality: N/A;   LITHOTRIPSY  10/2008   MEMBRANE PEEL  Right 07/19/2020   Procedure: MEMBRANE PEEL;  Surgeon: Bernarda Caffey, MD;  Location: [place];  Service: Ophthalmology;  Laterality: Right;   PERCUTANEOUS CORONARY STENT INTERVENTION (PCI-S) N/A 07/09/2011   Procedure: PERCUTANEOUS CORONARY STENT INTERVENTION (PCI-S);  Surgeon: Sherren Mocha, MD;  [facility];  Service: Cardiovascular;  Laterality: N/A;   PHOTOCOAGULATION WITH LASER Right 07/19/2020   Procedure: PHOTOCOAGULATION WITH LASER;  Surgeon: Bernarda Caffey, MD;  Location: [place];  Service: Ophthalmology;  Laterality: Right;   UPPER GI ENDOSCOPY     URETEROSCOPY  10/2009    FAMILY HISTORY Family History  Problem Relation Age of Onset   Heart attack Father 19       LVAD   Hypertension Other     SOCIAL HISTORY Social History   Tobacco Use   Smoking status: Former    Packs/day: 0.50    Years: 20.00    Pack years: 10.00    Types: Cigarettes    Quit date: 02/17/1990    Years since quitting: 30.6   Smokeless tobacco: Never  Vaping Use   Vaping Use: Never used  Substance Use Topics   Alcohol use: Not Currently    Alcohol/week: 4.0 standard drinks    Types: 2 Glasses of wine, 2 Cans of beer per week   Drug use: Yes    Frequency: 7.0 times per week    Types: Marijuana    Comment: 07/09/11 "lets say a cigarette  a day" Last used         OPHTHALMIC EXAM:  Base Eye Exam     Visual Acuity (Snellen - Linear)       Right Left   Dist cc 20/80 20/25 -2   Dist ph cc 20/30 -2     Correction: Glasses         Tonometry (Tonopen, 9:18 AM)       Right Left   Pressure 18 15         Pupils       Dark Light Shape React APD   Right 4 3 Round Brisk None   Left 4 3 Round Brisk None         Visual Fields (Counting fingers)       Left Right    Full    Restrictions  Partial inner inferior temporal, inferior nasal deficiencies         Extraocular Movement       Right Left    Full, Ortho Full, Ortho         Neuro/Psych     Oriented x3: Yes   Mood/Affect: Normal         Dilation     Both eyes: 1.0% Mydriacyl, 2.5% Phenylephrine @ 9:19 AM           Slit Lamp and Fundus Exam     External Exam       Right Left   External Normal Normal         Slit Lamp Exam       Right Left   Lids/Lashes Dermatochalasis - upper lid Dermatochalasis - upper lid   Conjunctiva/Sclera White and quiet White and quiet   Cornea Mild arcus, trace PEE Mild arcus, trace PEE   Anterior Chamber Deep and quiet Deep and quiet   Iris Round and dilated Round and dilated   Lens 3+ Nuclear sclerosis with early brunescense, 2-3+ Cortical cataract, trace Posterior subcapsular cataract 2+ Nuclear sclerosis, 2+ Cortical cataract   Vitreous post vitrectomy, clear, gas  bubble gone Vitreous syneresis, Posterior vitreous detachment         Fundus Exam       Right Left   Disc Pink and Sharp, mild tilt, compact, temporal PPA Pink and Sharp, +PPP   C/D Ratio 0.3 0.4   Macula Flat, mac hole closed, no heme Flat, Blunted foveal reflex, +Cystic changes, mild ERM, no heme   Vessels mild attenuation, mild tortuousity mild attenuation, mild tortuousity   Periphery Attached, 360 peripheral laser changes Attached              Refraction     Wearing Rx       Sphere Cylinder Axis Add   Right  -1.25 +1.00 125 +2.50   Left -1.75 +1.00 155 +2.50            IMAGING AND PROCEDURES  Imaging and Procedures for 10/03/2020  OCT, Retina - OU - Both Eyes       Right Eye Quality was good. Central Foveal Thickness: 305. Progression has been stable. Findings include abnormal foveal contour, no IRF, subretinal fluid (Mac hole closed, punctate ellipsoid disruption, mildly irregular inner retinal surface temporal macula).   Left Eye Quality was good. Central Foveal Thickness: 353. Progression has been stable. Findings include abnormal foveal contour, intraretinal fluid, no SRF, epiretinal membrane (Foveal notch with central cystic changes).   Notes *Images captured and stored on drive  Diagnosis / Impression:  OD: Mac hole closed, punctate ellipsoid disruption, mildly irregular inner retinal surface temporal macula OS: ERM with central cystic changes  Clinical management:  See below  Abbreviations: NFP - Normal foveal profile. CME - cystoid macular edema. PED - pigment epithelial detachment. IRF - intraretinal fluid. SRF - subretinal fluid. EZ - ellipsoid zone. ERM - epiretinal membrane. ORA - outer retinal atrophy. ORT - outer retinal tubulation. SRHM - subretinal hyper-reflective material. IRHM - intraretinal hyper-reflective material            ASSESSMENT/PLAN:    ICD-10-CM   1. Macular hole of right eye  H35.341     2. Retinal edema  H35.81 OCT, Retina - OU - Both Eyes    3. Essential hypertension  I10     4. Hypertensive retinopathy of both eyes  H35.033     5. Combined forms of age-related cataract of both eyes  H25.813      1,2. Macular hole, OD  - full thickness macular hole with mild surrounding CME  - initially diagnosed and referred here in Feb 2022  - pre op BCVA -- 20/50-2 OD  - s/p PPV/TissueBlue/MP/14% C3F8 OD, 06.02.22             - doing well              - retina attached and w/ mac hole closed  - BCVA 20/30!  - OCT shows mac hole closed with  punctate ellipsoid disruption             - IOP okay at 14  - gas bubble gone             - off all drops  - discussed cataract progression -- will benefit from cataract removal OD - f/u 6 months, DFE, OCT  3,4. Hypertensive retinopathy OU - discussed importance of tight BP control - monitor  5. Mixed Cataract OU - The symptoms of cataract, surgical options, and treatments and risks were discussed with patient. - discussed diagnosis and progression, specifically discussed cataract progression post macular hole repair -  further progression noted OD today - pt will benefit from cataract surgery OD as cataract continues to progress - pt scheduled to see Dr. Ellin Mayhew tomorrow and plans to discuss possible referral for cataract  Ophthalmic Meds Ordered this visit:  No orders of the defined types were placed in this encounter.      Return in about 6 months (around 04/05/2021) for f/u macular hole OD, DFE, OCT.  There are no Patient Instructions on file for this visit.   Explained the diagnoses, plan, and follow up with the patient and they expressed understanding.  Patient expressed understanding of the importance of proper follow up care.    This document serves as a record of services personally performed by Gardiner Sleeper, MD, PhD. It was created on their behalf by Roselee Nova, COMT. The creation of this record is the provider's dictation and/or activities during the visit.  Electronically signed by: Roselee Nova, COMT 10/03/20 12:40 PM  Gardiner Sleeper, M.D., Ph.D. Diseases & Surgery of the Retina and Vitreous [facility]  I have reviewed the above documentation for accuracy and completeness, and I agree with the above. Gardiner Sleeper, M.D., Ph.D. 10/03/20 12:40 PM  Abbreviations: M myopia (nearsighted); A astigmatism; H hyperopia (farsighted); P presbyopia; Mrx spectacle prescription;  CTL contact lenses; OD right eye; OS left eye; OU both eyes  XT  exotropia; ET esotropia; PEK punctate epithelial keratitis; PEE punctate epithelial erosions; DES dry eye syndrome; MGD meibomian gland dysfunction; ATs artificial tears; PFAT's preservative free artificial tears; [place] nuclear sclerotic cataract; PSC posterior subcapsular cataract; ERM epi-retinal membrane; PVD posterior vitreous detachment; RD retinal detachment; DM diabetes mellitus; DR diabetic retinopathy; NPDR non-proliferative diabetic retinopathy; PDR proliferative diabetic retinopathy; CSME clinically significant macular edema; DME diabetic macular edema; dbh dot blot hemorrhages; CWS cotton wool spot; POAG primary open angle glaucoma; C/D cup-to-disc ratio; HVF humphrey visual field; GVF goldmann visual field; OCT optical coherence tomography; IOP intraocular pressure; BRVO Branch retinal vein occlusion; CRVO central retinal vein occlusion; CRAO central retinal artery occlusion; BRAO branch retinal artery occlusion; RT retinal tear; SB scleral buckle; PPV pars plana vitrectomy; VH Vitreous hemorrhage; PRP panretinal laser photocoagulation; IVK intravitreal kenalog; VMT vitreomacular traction; MH Macular hole;  NVD neovascularization of the disc; NVE neovascularization elsewhere; AREDS age related eye disease study; ARMD age related macular degeneration; POAG primary open angle glaucoma; EBMD epithelial/anterior basement membrane dystrophy; ACIOL anterior chamber intraocular lens; IOL intraocular lens; PCIOL posterior chamber intraocular lens; Phaco/IOL phacoemulsification with intraocular lens placement; [place] photorefractive keratectomy; LASIK laser assisted in situ keratomileusis; HTN hypertension; DM diabetes mellitus; COPD chronic obstructive pulmonary disease

## 2020-10-03 ENCOUNTER — Encounter (INDEPENDENT_AMBULATORY_CARE_PROVIDER_SITE_OTHER): Payer: Self-pay | Admitting: Ophthalmology

## 2020-10-03 ENCOUNTER — Other Ambulatory Visit: Payer: Self-pay

## 2020-10-03 ENCOUNTER — Ambulatory Visit (INDEPENDENT_AMBULATORY_CARE_PROVIDER_SITE_OTHER): Payer: BC Managed Care – PPO | Admitting: Ophthalmology

## 2020-10-03 DIAGNOSIS — I10 Essential (primary) hypertension: Secondary | ICD-10-CM

## 2020-10-03 DIAGNOSIS — H3581 Retinal edema: Secondary | ICD-10-CM | POA: Diagnosis not present

## 2020-10-03 DIAGNOSIS — H25813 Combined forms of age-related cataract, bilateral: Secondary | ICD-10-CM

## 2020-10-03 DIAGNOSIS — H35033 Hypertensive retinopathy, bilateral: Secondary | ICD-10-CM

## 2020-10-03 DIAGNOSIS — H35341 Macular cyst, hole, or pseudohole, right eye: Secondary | ICD-10-CM | POA: Diagnosis not present

## 2020-10-09 ENCOUNTER — Other Ambulatory Visit: Payer: Self-pay

## 2020-10-09 ENCOUNTER — Other Ambulatory Visit (INDEPENDENT_AMBULATORY_CARE_PROVIDER_SITE_OTHER): Payer: BC Managed Care – PPO

## 2020-10-09 DIAGNOSIS — E1159 Type 2 diabetes mellitus with other circulatory complications: Secondary | ICD-10-CM | POA: Diagnosis not present

## 2020-10-09 DIAGNOSIS — Z125 Encounter for screening for malignant neoplasm of prostate: Secondary | ICD-10-CM

## 2020-10-09 DIAGNOSIS — E782 Mixed hyperlipidemia: Secondary | ICD-10-CM

## 2020-10-09 DIAGNOSIS — D696 Thrombocytopenia, unspecified: Secondary | ICD-10-CM

## 2020-10-09 LAB — CBC WITH DIFFERENTIAL/PLATELET
Basophils Absolute: 0 10*3/uL (ref 0.0–0.1)
Basophils Relative: 0.7 % (ref 0.0–3.0)
Eosinophils Absolute: 0.1 10*3/uL (ref 0.0–0.7)
Eosinophils Relative: 1.7 % (ref 0.0–5.0)
HCT: 49 % (ref 39.0–52.0)
Hemoglobin: 16.6 g/dL (ref 13.0–17.0)
Lymphocytes Relative: 23.1 % (ref 12.0–46.0)
Lymphs Abs: 1.6 10*3/uL (ref 0.7–4.0)
MCHC: 33.8 g/dL (ref 30.0–36.0)
MCV: 92 fl (ref 78.0–100.0)
Monocytes Absolute: 0.6 10*3/uL (ref 0.1–1.0)
Monocytes Relative: 9 % (ref 3.0–12.0)
Neutro Abs: 4.6 10*3/uL (ref 1.4–7.7)
Neutrophils Relative %: 65.5 % (ref 43.0–77.0)
Platelets: 155 10*3/uL (ref 150.0–400.0)
RBC: 5.32 Mil/uL (ref 4.22–5.81)
RDW: 14 % (ref 11.5–15.5)
WBC: 7 10*3/uL (ref 4.0–10.5)

## 2020-10-09 LAB — LIPID PANEL
Cholesterol: 115 mg/dL (ref 0–200)
HDL: 30.3 mg/dL — ABNORMAL LOW (ref >39.00)
LDL Cholesterol: 58 mg/dL (ref 0–99)
NonHDL: 84.7
Total CHOL/HDL Ratio: 4
Triglycerides: 136 mg/dL (ref 0.0–149.0)
VLDL: 27.2 mg/dL (ref 0.0–40.0)

## 2020-10-09 LAB — HEPATIC FUNCTION PANEL
ALT: 19 U/L (ref 0–53)
AST: 22 U/L (ref 0–37)
Albumin: 4.4 g/dL (ref 3.5–5.2)
Alkaline Phosphatase: 41 U/L (ref 39–117)
Bilirubin, Direct: 0.2 mg/dL (ref 0.0–0.3)
Total Bilirubin: 0.8 mg/dL (ref 0.2–1.2)
Total Protein: 6.7 g/dL (ref 6.0–8.3)

## 2020-10-09 LAB — BASIC METABOLIC PANEL
BUN: 18 mg/dL (ref 6–23)
CO2: 31 mEq/L (ref 19–32)
Calcium: 9.5 mg/dL (ref 8.4–10.5)
Chloride: 101 mEq/L (ref 96–112)
Creatinine, Ser: 1.14 mg/dL (ref 0.40–1.50)
GFR: 68.13 mL/min (ref >60.00)
Glucose, Bld: 111 mg/dL — ABNORMAL HIGH (ref 70–99)
Potassium: 4 mEq/L (ref 3.5–5.1)
Sodium: 139 mEq/L (ref 135–145)

## 2020-10-09 LAB — PSA: PSA: 0.33 ng/mL (ref 0.10–4.00)

## 2020-10-09 LAB — HEMOGLOBIN A1C: Hgb A1c MFr Bld: 6.4 % (ref 4.6–6.5)

## 2020-10-09 LAB — MICROALBUMIN / CREATININE URINE RATIO
Creatinine,U: 114.5 mg/dL
Microalb Creat Ratio: 0.6 mg/g (ref 0.0–30.0)
Microalb, Ur: <0.7 mg/dL (ref 0.0–1.9)

## 2020-10-11 ENCOUNTER — Encounter: Payer: Self-pay | Admitting: *Deleted

## 2020-10-11 ENCOUNTER — Ambulatory Visit: Payer: BC Managed Care – PPO | Admitting: Internal Medicine

## 2020-10-23 DIAGNOSIS — G4700 Insomnia, unspecified: Secondary | ICD-10-CM | POA: Diagnosis not present

## 2020-10-23 DIAGNOSIS — F1220 Cannabis dependence, uncomplicated: Secondary | ICD-10-CM | POA: Diagnosis not present

## 2020-10-23 DIAGNOSIS — F3181 Bipolar II disorder: Secondary | ICD-10-CM | POA: Diagnosis not present

## 2020-11-02 ENCOUNTER — Other Ambulatory Visit: Payer: Self-pay | Admitting: Cardiovascular Disease

## 2020-12-25 ENCOUNTER — Other Ambulatory Visit: Payer: Self-pay | Admitting: Internal Medicine

## 2020-12-25 ENCOUNTER — Other Ambulatory Visit: Payer: Self-pay | Admitting: Cardiovascular Disease

## 2021-01-16 ENCOUNTER — Encounter: Payer: Self-pay | Admitting: Internal Medicine

## 2021-01-16 NOTE — Telephone Encounter (Signed)
Please make note on appt schedule to discuss wellbutrin dose.

## 2021-01-24 DIAGNOSIS — F3181 Bipolar II disorder: Secondary | ICD-10-CM | POA: Diagnosis not present

## 2021-01-24 DIAGNOSIS — G4700 Insomnia, unspecified: Secondary | ICD-10-CM | POA: Diagnosis not present

## 2021-01-24 DIAGNOSIS — F1211 Cannabis abuse, in remission: Secondary | ICD-10-CM | POA: Diagnosis not present

## 2021-02-06 ENCOUNTER — Other Ambulatory Visit: Payer: Self-pay | Admitting: Cardiovascular Disease

## 2021-02-08 DIAGNOSIS — R3912 Poor urinary stream: Secondary | ICD-10-CM | POA: Diagnosis not present

## 2021-02-08 DIAGNOSIS — N401 Enlarged prostate with lower urinary tract symptoms: Secondary | ICD-10-CM | POA: Diagnosis not present

## 2021-02-08 DIAGNOSIS — E291 Testicular hypofunction: Secondary | ICD-10-CM | POA: Diagnosis not present

## 2021-02-14 ENCOUNTER — Telehealth: Payer: BC Managed Care – PPO | Admitting: Family

## 2021-02-14 ENCOUNTER — Telehealth: Payer: Self-pay | Admitting: Internal Medicine

## 2021-02-14 NOTE — Telephone Encounter (Signed)
Patient wife called in states patient and her are having symptoms soar throat, coughing, sneezing, appetite change and stuffy and runny nose , sweats. I schedule a video appt for today and  xfer to access nurse

## 2021-02-14 NOTE — Telephone Encounter (Signed)
Patient has virtual appt today

## 2021-02-14 NOTE — Telephone Encounter (Signed)
FYI for you- Triage was disconnected because wife was being triaged as well. Unable to reach patient directly. Patient was scheduled for a VV this morning and cancelled. Wife stated he felt he did not need visit. Wife scheduled another VV while on the phone with me to discuss his symptoms. This appt has been cancelled as well. Patient will call back if he needs anything

## 2021-02-14 NOTE — Telephone Encounter (Signed)
Patient called in stated returning call to Larena Glassman, Wife did cancel Virtual appt because went  urgent but request call back from Eva  @ [phone]

## 2021-02-14 NOTE — Telephone Encounter (Signed)
LMTCB

## 2021-02-14 NOTE — Telephone Encounter (Signed)
LM to return call.

## 2021-02-14 NOTE — Telephone Encounter (Signed)
Noted and agree.  Follow treatment plan from office  if not improving or any worsening within 72 hours and also return to office or open medical facility at ANYTIME if any symptoms persist, change, or worsen or you have any further concerns or questions. Call 911 immediately for emergencies.

## 2021-02-15 ENCOUNTER — Telehealth: Payer: BC Managed Care – PPO | Admitting: Family

## 2021-02-20 DIAGNOSIS — R3912 Poor urinary stream: Secondary | ICD-10-CM | POA: Diagnosis not present

## 2021-02-20 DIAGNOSIS — Z87442 Personal history of urinary calculi: Secondary | ICD-10-CM | POA: Diagnosis not present

## 2021-02-20 DIAGNOSIS — E291 Testicular hypofunction: Secondary | ICD-10-CM | POA: Diagnosis not present

## 2021-02-20 DIAGNOSIS — N401 Enlarged prostate with lower urinary tract symptoms: Secondary | ICD-10-CM | POA: Diagnosis not present

## 2021-03-14 DIAGNOSIS — F1211 Cannabis abuse, in remission: Secondary | ICD-10-CM | POA: Diagnosis not present

## 2021-03-14 DIAGNOSIS — F3181 Bipolar II disorder: Secondary | ICD-10-CM | POA: Diagnosis not present

## 2021-03-14 DIAGNOSIS — G4700 Insomnia, unspecified: Secondary | ICD-10-CM | POA: Diagnosis not present

## 2021-03-21 ENCOUNTER — Other Ambulatory Visit: Payer: Self-pay | Admitting: *Deleted

## 2021-03-21 ENCOUNTER — Other Ambulatory Visit: Payer: Self-pay | Admitting: Urology

## 2021-03-21 ENCOUNTER — Encounter: Payer: Self-pay | Admitting: Internal Medicine

## 2021-03-21 ENCOUNTER — Encounter: Payer: Self-pay | Admitting: Cardiovascular Disease

## 2021-03-21 NOTE — Telephone Encounter (Signed)
Forwarded message to RN to verify correct dose.

## 2021-03-21 NOTE — Telephone Encounter (Signed)
lMOVM to verify dose change.

## 2021-03-22 ENCOUNTER — Telehealth: Payer: Self-pay | Admitting: Internal Medicine

## 2021-03-22 NOTE — Telephone Encounter (Signed)
Patient wife called in need Potassium citrate ER  10 MEQ. It needs to be sent to [address], [place] number [phone]. I did advise it may take up to 48 hrs to 72 hrs . Patient needs 90 day supply

## 2021-03-22 NOTE — Telephone Encounter (Signed)
Contacted pt to return call to discuss dose change Lmovm no answer x2.

## 2021-03-25 ENCOUNTER — Other Ambulatory Visit: Payer: Self-pay

## 2021-03-25 ENCOUNTER — Encounter: Payer: Self-pay | Admitting: Internal Medicine

## 2021-03-25 MED ORDER — POTASSIUM CITRATE ER 10 MEQ (1080 MG) PO TBCR: 10.0000 meq | EXTENDED_RELEASE_TABLET | Freq: Every day | ORAL | 0 refills | Status: DC

## 2021-03-25 NOTE — Telephone Encounter (Signed)
He sent a message to cardiology as well. We refilled last time in August. He is only taking one a day. He has labs scheduled 2/23. Ok to fill for 30 days?

## 2021-03-25 NOTE — Telephone Encounter (Signed)
Ok to refill, but needs to keep appt.  It appears he called and scheduled appt.

## 2021-03-25 NOTE — Telephone Encounter (Signed)
Medication refilled. Advised patient prior to this phone call to set up f/u appt.

## 2021-03-27 ENCOUNTER — Other Ambulatory Visit: Payer: Self-pay | Admitting: Internal Medicine

## 2021-03-31 ENCOUNTER — Other Ambulatory Visit: Payer: Self-pay | Admitting: Internal Medicine

## 2021-04-08 ENCOUNTER — Encounter (INDEPENDENT_AMBULATORY_CARE_PROVIDER_SITE_OTHER): Payer: BC Managed Care – PPO | Admitting: Ophthalmology

## 2021-04-11 ENCOUNTER — Other Ambulatory Visit: Payer: BC Managed Care – PPO

## 2021-04-15 ENCOUNTER — Ambulatory Visit: Payer: BC Managed Care – PPO | Admitting: Internal Medicine

## 2021-04-26 ENCOUNTER — Telehealth: Payer: Self-pay | Admitting: *Deleted

## 2021-04-26 DIAGNOSIS — E782 Mixed hyperlipidemia: Secondary | ICD-10-CM

## 2021-04-26 DIAGNOSIS — D696 Thrombocytopenia, unspecified: Secondary | ICD-10-CM

## 2021-04-26 DIAGNOSIS — E1159 Type 2 diabetes mellitus with other circulatory complications: Secondary | ICD-10-CM

## 2021-04-26 DIAGNOSIS — R945 Abnormal results of liver function studies: Secondary | ICD-10-CM

## 2021-04-26 DIAGNOSIS — I10 Essential (primary) hypertension: Secondary | ICD-10-CM

## 2021-04-26 NOTE — Telephone Encounter (Signed)
Please place future orders for lab appt.  

## 2021-04-27 NOTE — Telephone Encounter (Signed)
Order placed for f/u labs.  

## 2021-05-01 ENCOUNTER — Other Ambulatory Visit: Payer: BC Managed Care – PPO

## 2021-05-02 ENCOUNTER — Other Ambulatory Visit (INDEPENDENT_AMBULATORY_CARE_PROVIDER_SITE_OTHER): Payer: BC Managed Care – PPO

## 2021-05-02 ENCOUNTER — Other Ambulatory Visit: Payer: Self-pay

## 2021-05-02 DIAGNOSIS — E1159 Type 2 diabetes mellitus with other circulatory complications: Secondary | ICD-10-CM | POA: Diagnosis not present

## 2021-05-02 DIAGNOSIS — E782 Mixed hyperlipidemia: Secondary | ICD-10-CM | POA: Diagnosis not present

## 2021-05-03 LAB — HEMOGLOBIN A1C: Hgb A1c MFr Bld: 6.5 % (ref 4.6–6.5)

## 2021-05-03 LAB — LIPID PANEL
Cholesterol: 122 mg/dL (ref 0–200)
HDL: 34.6 mg/dL — ABNORMAL LOW (ref >39.00)
LDL Cholesterol: 57 mg/dL (ref 0–99)
NonHDL: 87.46
Total CHOL/HDL Ratio: 4
Triglycerides: 150 mg/dL — ABNORMAL HIGH (ref 0.0–149.0)
VLDL: 30 mg/dL (ref 0.0–40.0)

## 2021-05-03 LAB — CBC WITH DIFFERENTIAL/PLATELET
Basophils Absolute: 0.1 10*3/uL (ref 0.0–0.1)
Basophils Relative: 1 % (ref 0.0–3.0)
Eosinophils Absolute: 0.1 10*3/uL (ref 0.0–0.7)
Eosinophils Relative: 1.3 % (ref 0.0–5.0)
HCT: 46.4 % (ref 39.0–52.0)
Hemoglobin: 15.7 g/dL (ref 13.0–17.0)
Lymphocytes Relative: 19.9 % (ref 12.0–46.0)
Lymphs Abs: 1.3 10*3/uL (ref 0.7–4.0)
MCHC: 33.9 g/dL (ref 30.0–36.0)
MCV: 91.9 fl (ref 78.0–100.0)
Monocytes Absolute: 0.5 10*3/uL (ref 0.1–1.0)
Monocytes Relative: 8.2 % (ref 3.0–12.0)
Neutro Abs: 4.5 10*3/uL (ref 1.4–7.7)
Neutrophils Relative %: 69.6 % (ref 43.0–77.0)
Platelets: 177 10*3/uL (ref 150.0–400.0)
RBC: 5.05 Mil/uL (ref 4.22–5.81)
RDW: 13.6 % (ref 11.5–15.5)
WBC: 6.5 10*3/uL (ref 4.0–10.5)

## 2021-05-03 LAB — HEPATIC FUNCTION PANEL
ALT: 24 U/L (ref 0–53)
AST: 24 U/L (ref 0–37)
Albumin: 4.5 g/dL (ref 3.5–5.2)
Alkaline Phosphatase: 47 U/L (ref 39–117)
Bilirubin, Direct: 0.1 mg/dL (ref 0.0–0.3)
Total Bilirubin: 0.5 mg/dL (ref 0.2–1.2)
Total Protein: 6.8 g/dL (ref 6.0–8.3)

## 2021-05-03 LAB — BASIC METABOLIC PANEL
BUN: 21 mg/dL (ref 6–23)
CO2: 27 mEq/L (ref 19–32)
Calcium: 9.6 mg/dL (ref 8.4–10.5)
Chloride: 104 mEq/L (ref 96–112)
Creatinine, Ser: 1.23 mg/dL (ref 0.40–1.50)
GFR: 61.95 mL/min (ref >60.00)
Glucose, Bld: 139 mg/dL — ABNORMAL HIGH (ref 70–99)
Potassium: 4.6 mEq/L (ref 3.5–5.1)
Sodium: 139 mEq/L (ref 135–145)

## 2021-05-03 LAB — TSH: TSH: 2 u[IU]/mL (ref 0.35–5.50)

## 2021-05-07 ENCOUNTER — Ambulatory Visit (INDEPENDENT_AMBULATORY_CARE_PROVIDER_SITE_OTHER): Payer: BC Managed Care – PPO | Admitting: Internal Medicine

## 2021-05-07 ENCOUNTER — Other Ambulatory Visit: Payer: Self-pay

## 2021-05-07 DIAGNOSIS — N401 Enlarged prostate with lower urinary tract symptoms: Secondary | ICD-10-CM

## 2021-05-07 DIAGNOSIS — F3181 Bipolar II disorder: Secondary | ICD-10-CM

## 2021-05-07 DIAGNOSIS — Z860100 Personal history of colon polyps, unspecified: Secondary | ICD-10-CM

## 2021-05-07 DIAGNOSIS — I208 Other forms of angina pectoris: Secondary | ICD-10-CM | POA: Diagnosis not present

## 2021-05-07 DIAGNOSIS — I10 Essential (primary) hypertension: Secondary | ICD-10-CM | POA: Diagnosis not present

## 2021-05-07 DIAGNOSIS — I25118 Atherosclerotic heart disease of native coronary artery with other forms of angina pectoris: Secondary | ICD-10-CM

## 2021-05-07 DIAGNOSIS — G4730 Sleep apnea, unspecified: Secondary | ICD-10-CM

## 2021-05-07 DIAGNOSIS — E1159 Type 2 diabetes mellitus with other circulatory complications: Secondary | ICD-10-CM

## 2021-05-07 DIAGNOSIS — K2100 Gastro-esophageal reflux disease with esophagitis, without bleeding: Secondary | ICD-10-CM

## 2021-05-07 DIAGNOSIS — R945 Abnormal results of liver function studies: Secondary | ICD-10-CM

## 2021-05-07 DIAGNOSIS — D696 Thrombocytopenia, unspecified: Secondary | ICD-10-CM

## 2021-05-07 DIAGNOSIS — I2089 Other forms of angina pectoris: Secondary | ICD-10-CM

## 2021-05-07 DIAGNOSIS — E782 Mixed hyperlipidemia: Secondary | ICD-10-CM

## 2021-05-07 DIAGNOSIS — F419 Anxiety disorder, unspecified: Secondary | ICD-10-CM

## 2021-05-07 NOTE — Progress Notes (Signed)
Patient ID: Keith Vi., male   DOB: November 04, 1955, 66 y.o.   MRN: [id number] ? ? ?Subjective:  ? ? Patient ID: Keith Serio., male    DOB: 07/12/1955, 66 y.o.   MRN: [id number] ? ?This visit occurred during the SARS-CoV-2 public health emergency.  Safety protocols were in place, including screening questions prior to the visit, additional usage of staff PPE, and extensive cleaning of exam room while observing appropriate contact time as indicated for disinfecting solutions.  ? ?Patient here for a scheduled follow up.  .  ? ?HPI ?Saw Dr Rockey Situ - 06/2020 - adjusted timing of olmesartan/hctz and instructed hctz in am - prn.  Off metformin.  Feels he is doing relatively well.  Denies any chest pain or sob.  No acid reflux or abdominal pain reported.  Has been seeing Dr Nicolasa Ducking reports he has been stable on his current medication regimen.  He is accompanied by his wife.  History obtained from both of them.  Reports that he will no longer be seeing his psychiatrist.  States his insurance is changing.  He was questioning if I would refill medications and f/u here.  Also informed me that Dr Nicolasa Ducking recently decreased his effexor due to elevated blood pressure.  Is in the process of tapering the dose.  Apparently he plans to contact a therapist.  Discussed given his history of bipolar and current medication regimen, that I would recommend for him to continue seeing a psychiatrist.  Discussed with Keith Gross and his wife regarding this recommendation and reasoning for seeing a psychiatrist.  Agree with seeing a therapist as well.    ? ? ?Past Medical History:  ?Diagnosis Date  ? Acid reflux   ? Angina 2013  ? no current problems  ? Anxiety   ? Arthritis   ? Bipolar disorder ([place])   ? type 2  ? Depression   ? Diabetes mellitus without complication ([place])   ? type 2  ? Diastasis of muscle 2011  ? "abdominal"  ? Dysplastic polyp of colon 2008  ? [facility]  ? GERD (gastroesophageal reflux disease)   ? Gout   ? Multiple episodes - no  current problems  ? Heart disease   ? History of kidney stones   ? surgery to remove stones  ? Hyperlipidemia   ? Hypertension   ? Myocardial infarction [facility]) 05/2011  ? "we think";  06/2011 abnl mv;  06/2011 Cath - subtotal occlusion of Ramus, otw nonobs dzs;  06/2011 PCI/DES of Ramus w/ 2.25x56m Resolute DES   ? Sleep apnea   ? uses CPAP nightly  ? Syncopal episodes 05/2020  ? Syncope 05/2020  ? Treadmill stress test negative for angina pectoris 2011  ? ?Past Surgical History:  ?Procedure Laterality Date  ? 25 GAUGE PARS PLANA VITRECTOMY WITH 20 GAUGE MVR PORT FOR MACULAR HOLE Right 07/19/2020  ? Procedure: 2[place]ardVITRECTOMY FOR MACULAR HOLE;  Surgeon: ZBernarda Caffey MD;  [place]  Service: Ophthalmology;  Laterality: Right;  ? CARDIAC CATHETERIZATION  06/2011  ? "diagnostic"  ? COLONOSCOPY    ? CORONARY ANGIOPLASTY WITH STENT PLACEMENT  07/09/11  ? "1"  ? GAS/FLUID EXCHANGE Right 07/19/2020  ? Procedure: GAS/FLUID EXCHANGE;  Surgeon: ZBernarda Caffey MD;  Location[place]  Service: Ophthalmology;  Laterality: Right;  c3 f8  ? [place] ? [place] ? left  ? LEFT HEART CATHETERIZATION WITH CORONARY ANGIOGRAM N/A  10/21/2011  ? Procedure: LEFT HEART CATHETERIZATION WITH CORONARY ANGIOGRAM;  Surgeon: Hillary Bow, MD;  Location: [facility] CATH LAB;  Service: Cardiovascular;  Laterality: N/A;  ? LITHOTRIPSY  10/2008  ? MEMBRANE PEEL Right 07/19/2020  ? Procedure: MEMBRANE PEEL;  Surgeon: Bernarda Caffey, MD;  Location: [place];  Service: Ophthalmology;  Laterality: Right;  ? PERCUTANEOUS CORONARY STENT INTERVENTION (PCI-S) N/A 07/09/2011  ? Procedure: PERCUTANEOUS CORONARY STENT INTERVENTION (PCI-S);  Surgeon: Sherren Mocha, MD;  Location: [facility] C[facility] LAB;  Service: Cardiovascular;  Laterality: N/A;  ? PHOTOCOAGULATION WITH LASER Right 07/19/2020  ? Procedure: PHOTOCOAGULATION WITH LASER;  Surgeon: Bernarda Caffey, MD;  Location: [place];  Service: Ophthalmology;  Laterality: Right;  ? UPPER GI  ENDOSCOPY    ? URETEROSCOPY  10/2009  ? ?Family History  ?Problem Relation Age of Onset  ? Heart attack Father 71  ?     LVAD  ? Hypertension Other   ? ?Social History  ? ?Socioeconomic History  ? Marital status: Married  ?  Spouse name: Not on file  ? Number of children: 4  ? Years of education: Not on file  ? Highest education level: Not on file  ?Occupational History  ? Occupation: Conservation officer, [employer]  ?Tobacco Use  ? Smoking status: Former  ?  Packs/day: 0.50  ?  Years: 20.00  ?  Pack years: 10.00  ?  Types: Cigarettes  ?  Quit date: 02/17/1990  ?  Years since quitting: 31.2  ? Smokeless tobacco: Never  ?Vaping Use  ? Vaping Use: Never used  ?Substance and Sexual Activity  ? Alcohol use: Not Currently  ?  Alcohol/week: 4.0 standard drinks  ?  Types: 2 Glasses of wine, 2 Cans of beer per week  ? Drug use: Yes  ?  Frequency: 7.0 times per week  ?  Types: Marijuana  ?  Comment: 07/09/11 "lets say a cigarette a day" Last used  ? Sexual activity: Yes  ?Other Topics Concern  ? Not on file  ?Social History Narrative  ? Married and has 4 children.    ? ?Social Determinants of Health  ? ?Financial Resource Strain: Not on file  ?Food Insecurity: Not on file  ?Transportation Needs: Not on file  ?Physical Activity: Not on file  ?Stress: Not on file  ?Social Connections: Not on file  ? ? ? ?Review of Systems  ?Constitutional:  Negative for appetite change and unexpected weight change.  ?HENT:  Negative for congestion and sinus pressure.   ?Respiratory:  Negative for cough, chest tightness and shortness of breath.   ?Cardiovascular:  Negative for chest pain, palpitations and leg swelling.  ?Gastrointestinal:  Negative for abdominal pain, diarrhea, nausea and vomiting.  ?Genitourinary:  Negative for difficulty urinating and dysuria.  ?Musculoskeletal:  Negative for joint swelling and myalgias.  ?Skin:  Negative for color change and rash.  ?Neurological:  Negative for dizziness, light-headedness and headaches.   ?Psychiatric/Behavioral:  Negative for agitation and dysphoric mood.   ? ?   ?Objective:  ?  ? ?BP (!) 142/72   Pulse 88   Temp 97.9 ?F (36.6 ?C)   Resp 16   Ht 6' (1.829 m)   Wt 213 lb 6.4 oz (96.8 kg)   SpO2 99%   BMI 28.94 kg/m?  ?Wt Readings from Last 3 Encounters:  ?05/07/21 213 lb 6.4 oz (96.8 kg)  ?07/19/20 202 lb (91.6 kg)  ?06/22/20 215 lb (97.5 kg)  ? ? ?Physical Exam ?Constitutional:   ?  General: He is not in acute distress. ?   Appearance: Normal appearance. He is well-developed.  ?HENT:  ?   Head: Normocephalic and atraumatic.  ?   Right Ear: External ear normal.  ?   Left Ear: External ear normal.  ?Eyes:  ?   General: No scleral icterus.    ?   Right eye: No discharge.     ?   Left eye: No discharge.  ?Cardiovascular:  ?   Rate and Rhythm: Normal rate and regular rhythm.  ?Pulmonary:  ?   Effort: Pulmonary effort is normal. No respiratory distress.  ?   Breath sounds: Normal breath sounds.  ?Abdominal:  ?   General: Bowel sounds are normal.  ?   Palpations: Abdomen is soft.  ?   Tenderness: There is no abdominal tenderness.  ?Musculoskeletal:     ?   General: No swelling or tenderness.  ?   Cervical back: Neck supple. No tenderness.  ?Lymphadenopathy:  ?   Cervical: No cervical adenopathy.  ?Skin: ?   Findings: No erythema or rash.  ?Neurological:  ?   Mental Status: He is alert.  ?Psychiatric:     ?   Mood and Affect: Mood normal.     ?   Behavior: Behavior normal.  ? ? ? ?Outpatient Encounter Medications as of 05/07/2021  ?Medication Sig  ? atorvastatin (LIPITOR) 20 MG tablet TAKE ONE TABLET BY MOUTH ONE TIME DAILY  ? bacitracin-polymyxin b (POLYSPORIN) ophthalmic ointment Place into the right eye at bedtime. Place a 1/2 inch ribbon of ointment into the lower eyelid.  ? BD HYPODERMIC NEEDLE 16G X 1" MISC FOR USE WITH TESTOSTERONE INJECTIONS  ? BD HYPODERMIC NEEDLE 18G X 1" MISC USE WITH TESTOSTERONE INJECTIONS  ? CINNAMON PO Take 1,000 mg by mouth 2 (two) times daily.   ? clopidogrel  (PLAVIX) 75 MG tablet TAKE ONE TABLET BY MOUTH ONE TIME DAILY  ? Cyanocobalamin (VITAMIN B-12) 5000 MCG SUBL Place 5,000 mcg under the tongue daily.  ? fluticasone (FLONASE) 50 MCG/ACT nasal spray USE 2 SPRAYS IN EACH NOSTRIL DAILY AS

## 2021-05-12 ENCOUNTER — Encounter: Payer: Self-pay | Admitting: Internal Medicine

## 2021-05-12 DIAGNOSIS — F3181 Bipolar II disorder: Secondary | ICD-10-CM | POA: Insufficient documentation

## 2021-05-12 NOTE — Assessment & Plan Note (Signed)
Followed by Dr Gollan.  No chest pain or sob reported.  Continue risk factor modification.  

## 2021-05-12 NOTE — Assessment & Plan Note (Signed)
Seeing Dr Raynelle Bring continue tamsulosin.   ?[facility] (Dr Raynelle Bring) evaluated  - 02/20/21 - continue potassium citrate 71mq daily.  F/u one year - KUB. (off hctz).  Continue tamsulosin.  Follow hgb.   ?

## 2021-05-12 NOTE — Assessment & Plan Note (Signed)
No chest pain.  Followed by cardiology.  On amlodipine, plavix and statin medication.  Follow.   ?

## 2021-05-12 NOTE — Assessment & Plan Note (Signed)
Due f/u colonoscopy.  ?

## 2021-05-12 NOTE — Assessment & Plan Note (Signed)
Low carb diet and exercise. Follow met b and a1c.   ?

## 2021-05-12 NOTE — Assessment & Plan Note (Signed)
Has been followed by Dr Nicolasa Ducking as outlined.  States due to insurance change - no longer going to see.  Was in the process of lowering dose of effexor.  Discussed need for f/up with psychiatry given psych history and current medications.  He is agreeable.  Is in the process of scheduling with a counselor.  ?

## 2021-05-12 NOTE — Assessment & Plan Note (Signed)
Continue hctz and amlodipine.  Follow pressures.  Follow metabolic panel.  

## 2021-05-12 NOTE — Assessment & Plan Note (Signed)
Has been followed by Dr Nicolasa Ducking as outlined.  States due to insurance change - no longer going to see.  Was in the process of lowering dose of effexor.  Discussed need for f/up with psychiatry given psych history and current medications.  He is agreeable.  Is in the process of scheduling with a counselor. Refer to psychiatry for continued medication management.  ?

## 2021-05-12 NOTE — Assessment & Plan Note (Signed)
Follow cbc.  

## 2021-05-12 NOTE — Assessment & Plan Note (Signed)
No upper symptoms reported.  Continue protonix.  

## 2021-05-12 NOTE — Assessment & Plan Note (Signed)
Continue lipitor.  Low cholesterol diet and exercise.  Follow lipid panel and liver function tests.  Triglycerides improved from last check.   ?Lab Results  ?Component Value Date  ? CHOL 122 05/02/2021  ? HDL 34.60 (L) 05/02/2021  ? [place] 57 05/02/2021  ? LDLDIRECT 87.0 06/05/2020  ? TRIG 150.0 (H) 05/02/2021  ? CHOLHDL 4 05/02/2021  ? ?

## 2021-05-12 NOTE — Assessment & Plan Note (Signed)
Continue cpap.  

## 2021-05-12 NOTE — Assessment & Plan Note (Signed)
Diet, exercise and weight loss. Follow liver function tests.   

## 2021-05-15 DIAGNOSIS — H35372 Puckering of macula, left eye: Secondary | ICD-10-CM | POA: Diagnosis not present

## 2021-05-15 DIAGNOSIS — H25013 Cortical age-related cataract, bilateral: Secondary | ICD-10-CM | POA: Diagnosis not present

## 2021-05-15 DIAGNOSIS — H2511 Age-related nuclear cataract, right eye: Secondary | ICD-10-CM | POA: Diagnosis not present

## 2021-05-15 DIAGNOSIS — H18413 Arcus senilis, bilateral: Secondary | ICD-10-CM | POA: Diagnosis not present

## 2021-05-15 DIAGNOSIS — H2513 Age-related nuclear cataract, bilateral: Secondary | ICD-10-CM | POA: Diagnosis not present

## 2021-05-29 ENCOUNTER — Encounter: Payer: Self-pay | Admitting: Internal Medicine

## 2021-05-29 DIAGNOSIS — F39 Unspecified mood [affective] disorder: Secondary | ICD-10-CM

## 2021-06-01 ENCOUNTER — Other Ambulatory Visit: Payer: Self-pay | Admitting: Internal Medicine

## 2021-06-01 ENCOUNTER — Other Ambulatory Visit: Payer: Self-pay | Admitting: Cardiovascular Disease

## 2021-06-03 NOTE — Telephone Encounter (Signed)
Left message for patient to call office.  

## 2021-06-03 NOTE — Telephone Encounter (Signed)
Order placed for psych referral.   

## 2021-06-18 DIAGNOSIS — H43812 Vitreous degeneration, left eye: Secondary | ICD-10-CM | POA: Diagnosis not present

## 2021-06-18 DIAGNOSIS — H35342 Macular cyst, hole, or pseudohole, left eye: Secondary | ICD-10-CM | POA: Diagnosis not present

## 2021-06-18 DIAGNOSIS — H35372 Puckering of macula, left eye: Secondary | ICD-10-CM | POA: Diagnosis not present

## 2021-06-18 DIAGNOSIS — H31091 Other chorioretinal scars, right eye: Secondary | ICD-10-CM | POA: Diagnosis not present

## 2021-06-19 ENCOUNTER — Other Ambulatory Visit: Payer: Self-pay | Admitting: Cardiovascular Disease

## 2021-06-19 ENCOUNTER — Encounter: Payer: Self-pay | Admitting: Internal Medicine

## 2021-06-19 ENCOUNTER — Other Ambulatory Visit: Payer: Self-pay | Admitting: Internal Medicine

## 2021-06-19 NOTE — Telephone Encounter (Signed)
Medications pended and rx approval sent to Dr Nicki Reaper ?

## 2021-06-20 NOTE — Telephone Encounter (Signed)
Please clarify with pt lamictal dose.  Also, need to confirm if psychiatry appt has been scheduled.  (He mentioned he had been contacted by referral coordinator).  I had discussed with him that if he had not established with a psychiatrist and needed a refill that I would refill until psych appt made.  Just need to clarify a few things prior to refill ?

## 2021-06-21 ENCOUNTER — Telehealth: Payer: Self-pay

## 2021-06-21 NOTE — Telephone Encounter (Signed)
I called patient & he stated that he had already spoken with you early today in regards to this & to confirm dosing.  ?

## 2021-06-21 NOTE — Telephone Encounter (Signed)
Called and clarified medication with pt.  Also discussed psychiatry referral.  Message sent to [facility] regarding f/u on referral ans scheduling an appt.  Explained I would refill medication until appt with psychiatry.   ?

## 2021-06-21 NOTE — Telephone Encounter (Signed)
? ?  Lm for pt to cb re :: ? ? ? ?Please clarify with pt lamictal dose.   ? ?Also, need to confirm if psychiatry appt has been scheduled.  (He mentioned he had been contacted by referral coordinator).   ?I had discussed with him that if he had not established with a psychiatrist and needed a refill that I would refill until psych appt made.  ?

## 2021-06-24 MED ORDER — LAMOTRIGINE 200 MG PO TABS: 200.0000 mg | ORAL_TABLET | Freq: Every day | ORAL | 0 refills | Status: DC

## 2021-06-24 MED ORDER — VENLAFAXINE HCL ER 150 MG PO CP24: 150.0000 mg | ORAL_CAPSULE | Freq: Every day | ORAL | 0 refills | Status: DC

## 2021-06-24 MED ORDER — TRAZODONE HCL 100 MG PO TABS: 200.0000 mg | ORAL_TABLET | Freq: Every day | ORAL | 0 refills | Status: DC

## 2021-06-24 MED ORDER — LAMOTRIGINE 150 MG PO TABS: 150.0000 mg | ORAL_TABLET | Freq: Every day | ORAL | 0 refills | Status: DC

## 2021-06-24 MED ORDER — HYDROXYZINE PAMOATE 50 MG PO CAPS
50.0000 mg | ORAL_CAPSULE | Freq: Two times a day (BID) | ORAL | 0 refills | Status: DC
Start: 2021-06-24 — End: 2021-07-22

## 2021-06-25 NOTE — Progress Notes (Addendum)
Patient ID: Keith Gross., male   DOB: Nov 28, 1955, 66 y.o.   MRN: [id number] ?Cardiology Office Note ? ?Date:  06/26/2021  ? ?ID:  Keith Gross., DOB 1956-01-28, MRN [id number] ? ?PCP:  Keith Pheasant, MD  ? ?Chief Complaint  ?Patient presents with  ? 12 month follow up   ?  "Doing well." Medications reviewed by the patient verbally.   ? ? ?HPI:  ?Keith Gross is a very pleasant 66 year old gentleman ?syncope in 2010 felt secondary to low blood pressure that improved with medication changes,   ?chest pain symptoms at the beginning of  April 2013,  ?stress test showing inferior lateral hypoperfusion consistent with large region of scar which was new from 2010,  ?cardiac catheterization showing severe ramus disease, with DES stent placed in May 2013. ?catheterization September 2013 for chest pain showing patent stent, distal left circumflex disease noted, moderate in nature. ?He presents today for follow-up of his coronary artery disease ? ?Last seen in clinic by myself May 2022 ?On that visit reported episode where he " fell out" ?Acutely orthostatic after getting up from a chair, went down onto the ground ?Was diaphoretic, took some time to recover ?He was orthostatic in the office, we decreased his HCTZ ?He had reported significant weight loss through dietary changes, was off metformin ? ?Lab work reviewed ?A1c 6.5 ?BMP normal ?Lab Results  ?Component Value Date  ? CHOL 122 05/02/2021  ? HDL 34.60 (L) 05/02/2021  ? [place] 57 05/02/2021  ? TRIG 150.0 (H) 05/02/2021  ? ? ?Goes to gym, total body ?Weights, no regular aerobic ? ?EKG personally reviewed by myself on todays visit ?Shows normal sinus rhythm rate 76 bpm no significant ST or T wave changes, right bundle branch block ? ?Other past medical history reviewed ? CPAP for  sleep apnea ? ? previous history of smoking, quit in 1992.  ?Father had his first heart attack at age 80. ? ?PMH:   has a past medical history of Acid reflux, Angina (2013), Anxiety,  Arthritis, Bipolar disorder ([place]), Depression, Diabetes mellitus without complication ([place]), Diastasis of muscle (2011), Dysplastic polyp of colon (2008), GERD (gastroesophageal reflux disease), Gout, Heart disease, History of kidney stones, Hyperlipidemia, Hypertension, Myocardial infarction ([place]) (05/2011), Sleep apnea, Syncopal episodes (05/2020), Syncope (05/2020), and Treadmill stress test negative for angina pectoris (2011). ? ?PSH:    ?Past Surgical History:  ?Procedure Laterality Date  ? 25 GAUGE PARS PLANA VITRECTOMY WITH 20 GAUGE MVR PORT FOR MACULAR HOLE Right 07/19/2020  ? Procedure: [place] VITRECTOMY FOR MACULAR HOLE;  Surgeon: Bernarda Caffey, MD;  Location: [place];  Service: Ophthalmology;  Laterality: Right;  ? CARDIAC CATHETERIZATION  06/2011  ? "diagnostic"  ? COLONOSCOPY    ? CORONARY ANGIOPLASTY WITH STENT PLACEMENT  07/09/11  ? "1"  ? GAS/FLUID EXCHANGE Right 07/19/2020  ? Procedure: GAS/FLUID EXCHANGE;  Surgeon: Bernarda Caffey, MD;  Location: [place];  Service: Ophthalmology;  Laterality: Right;  c3 f8  ? [place]  ? [place]  ? left  ? LEFT HEART CATHETERIZATION WITH CORONARY ANGIOGRAM N/A 10/21/2011  ? Procedure: LEFT HEART CATHETERIZATION WITH CORONARY ANGIOGRAM;  Surgeon: Hillary Bow, MD;  Location: [facility] CATH LAB;  Service: Cardiovascular;  Laterality: N/A;  ? LITHOTRIPSY  10/2008  ? MEMBRANE PEEL Right 07/19/2020  ? Procedure: MEMBRANE PEEL;  Surgeon: Bernarda Caffey, MD;  Location: [place];  Service: Ophthalmology;  Laterality: Right;  ? PERCUTANEOUS CORONARY STENT  INTERVENTION (PCI-S) N/A 07/09/2011  ? Procedure: PERCUTANEOUS CORONARY STENT INTERVENTION (PCI-S);  Surgeon: Sherren Mocha, MD;  Location: [facility] CATH LAB;  Service: Cardiovascular;  Laterality: N/A;  ? PHOTOCOAGULATION WITH LASER Right 07/19/2020  ? Procedure: PHOTOCOAGULATION WITH LASER;  Surgeon: Bernarda Caffey, MD;  Location: [place];  Service: Ophthalmology;  Laterality: Right;  ? UPPER GI ENDOSCOPY     ? URETEROSCOPY  10/2009  ? ? ?Current Outpatient Medications  ?Medication Sig Dispense Refill  ? aspirin EC 81 MG tablet Take 1 tablet (81 mg total) by mouth daily. Swallow whole. 90 tablet 3  ? bacitracin-polymyxin b (POLYSPORIN) ophthalmic ointment Place into the right eye at bedtime. Place a 1/2 inch ribbon of ointment into the lower eyelid. 3.5 g 3  ? BD HYPODERMIC NEEDLE 16G X 1" MISC FOR USE WITH TESTOSTERONE INJECTIONS 25 each 3  ? BD HYPODERMIC NEEDLE 18G X 1" MISC USE WITH TESTOSTERONE INJECTIONS    ? CINNAMON PO Take 1,000 mg by mouth 2 (two) times daily.     ? Cyanocobalamin (VITAMIN B-12) 5000 MCG SUBL Place 5,000 mcg under the tongue daily.    ? fluticasone (FLONASE) 50 MCG/ACT nasal spray USE 2 SPRAYS IN EACH NOSTRIL DAILY AS DIRECTED (Patient taking differently: Place 2 sprays into both nostrils at bedtime.) 48 g 1  ? glucose blood test strip Use as instructed to check sugars once daily. Dx E11.9 100 each 12  ? hydrOXYzine (VISTARIL) 50 MG capsule Take 1 capsule (50 mg total) by mouth 2 (two) times daily. 60 capsule 0  ? lamoTRIgine (LAMICTAL) 150 MG tablet Take 1 tablet (150 mg total) by mouth at bedtime. 30 tablet 0  ? lamoTRIgine (LAMICTAL) 200 MG tablet Take 1 tablet (200 mg total) by mouth daily. 30 tablet 0  ? Lancets (ONETOUCH ULTRASOFT) lancets Use as instructed to check sugars once daily. Dx E 11.9 100 each 12  ? magnesium oxide (MAG-OX) 400 MG tablet Take 400 mg by mouth daily.    ? pantoprazole (PROTONIX) 40 MG tablet TAKE ONE TABLET BY MOUTH ONE TIME DAILY 90 tablet 0  ? polyethylene glycol powder (GLYCOLAX/MIRALAX) powder TAKE 17 GRAMS BY MOUTH EVERY DAY as needed (as directed) (Patient taking differently: Take 17 g by mouth daily.) 527 g 1  ? potassium citrate (UROCIT-K) 10 MEQ (1080 MG) SR tablet TAKE ONE TABLET BY MOUTH ONE TIME DAILY 30 tablet 0  ? prednisoLONE acetate (PRED FORTE) 1 % ophthalmic suspension Place 1 drop into the right eye 4 (four) times daily. 15 mL 0  ? Syringe,  Disposable, (B-D SYRINGE LUER-LOK 3CC) 3 ML MISC 1 mL by Does not apply route every 14 (fourteen) days. 25 each 0  ? SYRINGE-NEEDLE, DISP, 3 ML (B-D 3CC LUER-LOK SYR 22GX1") 22G X 1" 3 ML MISC FOR TESTOSTERONE INJECTIONS 25 each 0  ? tamsulosin (FLOMAX) 0.4 MG CAPS capsule TAKE ONE CAPSULE BY MOUTH ONE TIME DAILY 90 capsule 1  ? testosterone cypionate (DEPOTESTOSTERONE CYPIONATE) 200 MG/ML injection Inject 0.7 mLs (140 mg total) into the muscle every 7 (seven) days. (Patient taking differently: Inject 140 mg into the muscle every 7 (seven) days. Every Monday) 10 mL 0  ? traZODone (DESYREL) 100 MG tablet Take 2 tablets (200 mg total) by mouth at bedtime. 30 tablet 0  ? venlafaxine XR (EFFEXOR-XR) 150 MG 24 hr capsule Take 1 capsule (150 mg total) by mouth daily with breakfast. 30 capsule 0  ? VITAMIN D, CHOLECALCIFEROL, PO Take 2,000 mg by mouth daily.    ?  VITAMIN E PO Take 2,000 Units by mouth daily.    ? zinc gluconate 50 MG tablet Take 50 mg by mouth daily.    ? atorvastatin (LIPITOR) 20 MG tablet Take 1 tablet (20 mg total) by mouth daily. 90 tablet 3  ? metFORMIN (GLUCOPHAGE-XR) 500 MG 24 hr tablet TAKE ONE TABLET BY MOUTH ONE TIME DAILY WITH BREAKFAST (Patient not taking: Reported on 06/26/2021) 90 tablet 1  ? olmesartan-hydrochlorothiazide (BENICAR HCT) 40-25 MG tablet Take 1 tablet by mouth at bedtime. 90 tablet 3  ? ?No current facility-administered medications for this visit.  ? ? ?Allergies:   Patient has no known allergies.  ? ?Social History:  The patient  reports that he quit smoking about 31 years ago. His smoking use included cigarettes. He has a 10.00 pack-year smoking history. He has never used smokeless tobacco. He reports that he does not currently use alcohol after a past usage of about 4.0 standard drinks per week. He reports current drug use. Frequency: 7.00 times per week. Drug: Marijuana.  ? ?Family History:   family history includes Heart attack (age of onset: 66) in his father; Hypertension  in an other family member.  ? ? ?Review of Systems: ?Review of Systems  ?Constitutional: Negative.   ?HENT: Negative.    ?Respiratory: Negative.    ?Cardiovascular: Negative.   ?Gastrointestinal: Negative.   ?Mu

## 2021-06-26 ENCOUNTER — Encounter: Payer: Self-pay | Admitting: Internal Medicine

## 2021-06-26 ENCOUNTER — Encounter: Payer: Self-pay | Admitting: Cardiovascular Disease

## 2021-06-26 ENCOUNTER — Ambulatory Visit: Payer: BC Managed Care – PPO | Admitting: Cardiovascular Disease

## 2021-06-26 VITALS — BP 130/68 | HR 75 | Ht 72.0 in | Wt 207.4 lb

## 2021-06-26 DIAGNOSIS — I25118 Atherosclerotic heart disease of native coronary artery with other forms of angina pectoris: Secondary | ICD-10-CM

## 2021-06-26 DIAGNOSIS — I10 Essential (primary) hypertension: Secondary | ICD-10-CM | POA: Diagnosis not present

## 2021-06-26 DIAGNOSIS — E1159 Type 2 diabetes mellitus with other circulatory complications: Secondary | ICD-10-CM

## 2021-06-26 DIAGNOSIS — E782 Mixed hyperlipidemia: Secondary | ICD-10-CM | POA: Diagnosis not present

## 2021-06-26 DIAGNOSIS — F419 Anxiety disorder, unspecified: Secondary | ICD-10-CM

## 2021-06-26 MED ORDER — ASPIRIN EC 81 MG PO TBEC: 81.0000 mg | DELAYED_RELEASE_TABLET | Freq: Every day | ORAL | 3 refills | Status: AC

## 2021-06-26 MED ORDER — ATORVASTATIN CALCIUM 20 MG PO TABS: 20.0000 mg | ORAL_TABLET | Freq: Every day | ORAL | 3 refills | Status: DC

## 2021-06-26 MED ORDER — OLMESARTAN MEDOXOMIL-HCTZ 40-25 MG PO TABS: 1.0000 | ORAL_TABLET | Freq: Every day | ORAL | 3 refills | Status: DC

## 2021-06-26 MED ORDER — TRAZODONE HCL 100 MG PO TABS: 200.0000 mg | ORAL_TABLET | Freq: Every day | ORAL | 0 refills | Status: DC

## 2021-06-26 NOTE — Telephone Encounter (Signed)
Rx sent in - corrected trazodone #60 with no refills.  ?

## 2021-06-26 NOTE — Patient Instructions (Signed)
Medication Instructions:  ?Ok to change from plavix to asa ec 81 mg daily ? ?If you need a refill on your cardiac medications before your next appointment, please call your pharmacy.  ? ?Lab work: ?No new labs needed ? ?Testing/Procedures: ?No new testing needed ? ?Follow-Up: ?At [facility], you and your health needs are our priority.  As part of our continuing mission to provide you with exceptional heart care, we have created designated Provider Care Teams.  These Care Teams include your primary Cardiologist (physician) and Advanced Practice Providers (APPs -  Physician Assistants and Nurse Practitioners) who all work together to provide you with the care you need, when you need it. ? ?You will need a follow up appointment in 12 months ? ?Providers on your designated Care Team:   ?Murray Hodgkins, NP ?Christell Faith, PA-C ?Cadence Kathlen Mody, PA-C ? ?COVID-19 Vaccine Information can be found at: [email or web address].[email or web address] For questions related to vaccine distribution or appointments, please email [email or web address] or call [phone].  ? ?

## 2021-07-01 ENCOUNTER — Other Ambulatory Visit: Payer: Self-pay | Admitting: Internal Medicine

## 2021-07-11 ENCOUNTER — Telehealth: Payer: Self-pay | Admitting: Cardiovascular Disease

## 2021-07-11 NOTE — Telephone Encounter (Signed)
   Pre-operative Risk Assessment    Patient Name: Keith Gross.  DOB: 1955/07/23 MRN: [id number]     Request for Surgical Clearance    Procedure:   CATARACT EXTRACTION OF THE RIGHT AND LEFT EYE  Date of Surgery:  Clearance 07/30/21 AND 08/13/21                             Surgeon:  DR Vevelyn Royals Surgeon's Group or Practice Name:  [facility] Phone number:  [phone] Fax number:  [phone]  Type of Clearance Requested:   - Medical AND PHARMACY IF NEEDED  Type of Anesthesia:  Not Indicated  Additional requests/questions:    Signed, Eli Phillips   07/11/2021, 12:02 PM

## 2021-07-11 NOTE — Telephone Encounter (Signed)
   Patient Name: Keith Gross.  DOB: 10-28-1955 MRN: [id number]  Primary Cardiologist: None  Chart reviewed as part of pre-operative protocol coverage. Cataract extractions are recognized in guidelines as low risk surgeries that do not typically require specific preoperative testing or holding of blood thinner therapy. Therefore, given past medical history and time since last visit, based on ACC/AHA guidelines, [name]. would be at acceptable risk for the planned procedure without further cardiovascular testing.   I will route this recommendation to the requesting party via Epic fax function and remove from pre-op pool.  Please call with questions.  Deberah Pelton, NP 07/11/2021, 1:10 PM

## 2021-07-22 ENCOUNTER — Telehealth: Payer: Self-pay | Admitting: Cardiovascular Disease

## 2021-07-22 ENCOUNTER — Telehealth: Payer: Self-pay | Admitting: Internal Medicine

## 2021-07-22 ENCOUNTER — Other Ambulatory Visit: Payer: Self-pay | Admitting: Internal Medicine

## 2021-07-22 NOTE — Telephone Encounter (Signed)
   Pre-operative Risk Assessment    Patient Name: Keith Gross.  DOB: [birth year] MRN: [id number]     Request for Surgical Clearance    Procedure:   CATARACT EXTRACTION RIGHT EYE AND LEFT EYE  Date of Surgery:  Clearance 07/30/21                               AND 08/06/21  Surgeon:  DR. KARL STONECIPHER Surgeon's Group or Practice Name:  [facility] Phone number:  [phone] Fax number:  [phone]  Type of Clearance Requested:   - Medical    Type of Anesthesia:  tTOPICAL  Additional requests/questions:    Signed, Eli Phillips   07/22/2021, 11:21 AM

## 2021-07-22 NOTE — Telephone Encounter (Signed)
   Patient Name: Keith Gross.  DOB: Feb 06, 1956 MRN: [id number]  Primary Cardiologist: None  Chart reviewed as part of pre-operative protocol coverage. Cataract extractions are recognized in guidelines as low risk surgeries that do not typically require specific preoperative testing or holding of blood thinner therapy. Therefore, given past medical history and time since last visit, based on ACC/AHA guidelines, Keith Gross. would be at acceptable risk for the planned procedure without further cardiovascular testing.   This is a duplicate request, same request was received on 5/25.  I will route this recommendation to the requesting party via Epic fax function and remove from pre-op pool.  Please call with questions.  Little Flock, [place] 07/22/2021, 3:57 PM

## 2021-07-22 NOTE — Telephone Encounter (Signed)
Lft pt vm to call ofc . thanks 

## 2021-07-23 ENCOUNTER — Telehealth: Payer: Self-pay | Admitting: Internal Medicine

## 2021-07-23 NOTE — Telephone Encounter (Signed)
-----   Message from Ashley Jacobs sent at 07/22/2021  1:45 PM EDT ----- Regarding: RE: psychiatry appt Good afternoon!  I called Dr Sherlyn Lees Gustke  ofc vm was full not able to leave msg. I left pt vm to call ofc Yw! ----- Message ----- From: Einar Pheasant, MD Sent: 07/20/2021   1:26 PM EDT To: Ashley Jacobs Subject: RE: psychiatry appt                            Have you heard anything about this referral?   Let me know if I need to do anything.  Thank you.   Dr Nicki Reaper ----- Message ----- From: Esau Grew R Sent: 07/10/2021  10:45 AM EDT To: Einar Pheasant, MD Subject: RE: psychiatry appt                            I left vm for Dr Eleonore Chiquito to call me to f/u on referral.  ----- Message ----- From: Einar Pheasant, MD Sent: 06/21/2021   9:48 PM EDT To: Ashley Jacobs Subject: psychiatry appt                                I had placed an order for a referral to psychiatry.  He reports he has not heard regarding an appt.  Can you help with this?    Thank you.  Dr Nicki Reaper

## 2021-07-30 DIAGNOSIS — H2511 Age-related nuclear cataract, right eye: Secondary | ICD-10-CM | POA: Diagnosis not present

## 2021-07-30 DIAGNOSIS — H269 Unspecified cataract: Secondary | ICD-10-CM | POA: Diagnosis not present

## 2021-07-30 DIAGNOSIS — H2512 Age-related nuclear cataract, left eye: Secondary | ICD-10-CM | POA: Diagnosis not present

## 2021-08-01 ENCOUNTER — Other Ambulatory Visit: Payer: BC Managed Care – PPO

## 2021-08-05 ENCOUNTER — Other Ambulatory Visit: Payer: Self-pay

## 2021-08-05 ENCOUNTER — Other Ambulatory Visit (INDEPENDENT_AMBULATORY_CARE_PROVIDER_SITE_OTHER): Payer: Medicare HMO

## 2021-08-05 ENCOUNTER — Telehealth: Payer: Self-pay

## 2021-08-05 DIAGNOSIS — Z125 Encounter for screening for malignant neoplasm of prostate: Secondary | ICD-10-CM

## 2021-08-05 DIAGNOSIS — I25118 Atherosclerotic heart disease of native coronary artery with other forms of angina pectoris: Secondary | ICD-10-CM | POA: Diagnosis not present

## 2021-08-05 DIAGNOSIS — E1159 Type 2 diabetes mellitus with other circulatory complications: Secondary | ICD-10-CM

## 2021-08-05 DIAGNOSIS — D696 Thrombocytopenia, unspecified: Secondary | ICD-10-CM

## 2021-08-05 DIAGNOSIS — I10 Essential (primary) hypertension: Secondary | ICD-10-CM

## 2021-08-05 LAB — CBC WITH DIFFERENTIAL/PLATELET
Basophils Absolute: 0.1 10*3/uL (ref 0.0–0.1)
Basophils Relative: 0.7 % (ref 0.0–3.0)
Eosinophils Absolute: 0.1 10*3/uL (ref 0.0–0.7)
Eosinophils Relative: 1.6 % (ref 0.0–5.0)
HCT: 47.8 % (ref 39.0–52.0)
Hemoglobin: 16.1 g/dL (ref 13.0–17.0)
Lymphocytes Relative: 19.1 % (ref 12.0–46.0)
Lymphs Abs: 1.6 10*3/uL (ref 0.7–4.0)
MCHC: 33.6 g/dL (ref 30.0–36.0)
MCV: 93.6 fl (ref 78.0–100.0)
Monocytes Absolute: 0.7 10*3/uL (ref 0.1–1.0)
Monocytes Relative: 8.1 % (ref 3.0–12.0)
Neutro Abs: 5.7 10*3/uL (ref 1.4–7.7)
Neutrophils Relative %: 70.5 % (ref 43.0–77.0)
Platelets: 168 10*3/uL (ref 150.0–400.0)
RBC: 5.11 Mil/uL (ref 4.22–5.81)
RDW: 13.6 % (ref 11.5–15.5)
WBC: 8.1 10*3/uL (ref 4.0–10.5)

## 2021-08-05 LAB — BASIC METABOLIC PANEL WITH GFR
BUN: 23 mg/dL (ref 6–23)
CO2: 30 meq/L (ref 19–32)
Calcium: 9.4 mg/dL (ref 8.4–10.5)
Chloride: 102 meq/L (ref 96–112)
Creatinine, Ser: 1.16 mg/dL (ref 0.40–1.50)
GFR: 66.34 mL/min (ref >60.00)
Glucose, Bld: 90 mg/dL (ref 70–99)
Potassium: 4.2 meq/L (ref 3.5–5.1)
Sodium: 139 meq/L (ref 135–145)

## 2021-08-05 LAB — HEPATIC FUNCTION PANEL
ALT: 24 U/L (ref 0–53)
AST: 26 U/L (ref 0–37)
Albumin: 4.4 g/dL (ref 3.5–5.2)
Alkaline Phosphatase: 41 U/L (ref 39–117)
Bilirubin, Direct: 0.1 mg/dL (ref 0.0–0.3)
Total Bilirubin: 0.7 mg/dL (ref 0.2–1.2)
Total Protein: 6.7 g/dL (ref 6.0–8.3)

## 2021-08-05 LAB — LIPID PANEL
Cholesterol: 123 mg/dL (ref 0–200)
HDL: 30.4 mg/dL — ABNORMAL LOW (ref >39.00)
LDL Cholesterol: 68 mg/dL (ref 0–99)
NonHDL: 92.7
Total CHOL/HDL Ratio: 4
Triglycerides: 124 mg/dL (ref 0.0–149.0)
VLDL: 24.8 mg/dL (ref 0.0–40.0)

## 2021-08-05 LAB — PSA: PSA: 0.38 ng/mL (ref 0.10–4.00)

## 2021-08-05 LAB — HEMOGLOBIN A1C: Hgb A1c MFr Bld: 6 % (ref 4.6–6.5)

## 2021-08-05 NOTE — Telephone Encounter (Signed)
Patient's wife, Edin Kon, called to state Dr. Alinda Money requested Dr. Einar Pheasant add a PSA test to patient's blood work that he is having done today.

## 2021-08-05 NOTE — Telephone Encounter (Signed)
Psa ordered

## 2021-08-07 ENCOUNTER — Ambulatory Visit (INDEPENDENT_AMBULATORY_CARE_PROVIDER_SITE_OTHER): Payer: BC Managed Care – PPO | Admitting: Internal Medicine

## 2021-08-07 DIAGNOSIS — H2511 Age-related nuclear cataract, right eye: Secondary | ICD-10-CM | POA: Diagnosis not present

## 2021-08-07 DIAGNOSIS — I10 Essential (primary) hypertension: Secondary | ICD-10-CM

## 2021-08-08 ENCOUNTER — Encounter: Payer: Self-pay | Admitting: Internal Medicine

## 2021-08-08 NOTE — Progress Notes (Signed)
Was not seen. Rescheduled.

## 2021-08-12 ENCOUNTER — Ambulatory Visit (INDEPENDENT_AMBULATORY_CARE_PROVIDER_SITE_OTHER): Payer: Medicare HMO | Admitting: Internal Medicine

## 2021-08-12 ENCOUNTER — Encounter: Payer: Self-pay | Admitting: Internal Medicine

## 2021-08-12 VITALS — BP 128/72 | HR 70 | Temp 98.5°F | Resp 17 | Ht 70.0 in | Wt 209.8 lb

## 2021-08-12 DIAGNOSIS — I208 Other forms of angina pectoris: Secondary | ICD-10-CM | POA: Diagnosis not present

## 2021-08-12 DIAGNOSIS — F3181 Bipolar II disorder: Secondary | ICD-10-CM | POA: Diagnosis not present

## 2021-08-12 DIAGNOSIS — E1159 Type 2 diabetes mellitus with other circulatory complications: Secondary | ICD-10-CM

## 2021-08-12 DIAGNOSIS — Z1211 Encounter for screening for malignant neoplasm of colon: Secondary | ICD-10-CM | POA: Diagnosis not present

## 2021-08-12 DIAGNOSIS — K2100 Gastro-esophageal reflux disease with esophagitis, without bleeding: Secondary | ICD-10-CM

## 2021-08-12 DIAGNOSIS — Z86010 Personal history of colonic polyps: Secondary | ICD-10-CM

## 2021-08-12 DIAGNOSIS — I10 Essential (primary) hypertension: Secondary | ICD-10-CM | POA: Diagnosis not present

## 2021-08-12 DIAGNOSIS — I25118 Atherosclerotic heart disease of native coronary artery with other forms of angina pectoris: Secondary | ICD-10-CM

## 2021-08-12 DIAGNOSIS — G4730 Sleep apnea, unspecified: Secondary | ICD-10-CM

## 2021-08-12 DIAGNOSIS — F419 Anxiety disorder, unspecified: Secondary | ICD-10-CM | POA: Diagnosis not present

## 2021-08-12 DIAGNOSIS — E782 Mixed hyperlipidemia: Secondary | ICD-10-CM

## 2021-08-12 DIAGNOSIS — D696 Thrombocytopenia, unspecified: Secondary | ICD-10-CM

## 2021-08-12 DIAGNOSIS — Z0000 Encounter for general adult medical examination without abnormal findings: Secondary | ICD-10-CM | POA: Diagnosis not present

## 2021-08-12 DIAGNOSIS — N401 Enlarged prostate with lower urinary tract symptoms: Secondary | ICD-10-CM

## 2021-08-12 MED ORDER — POTASSIUM CITRATE ER 10 MEQ (1080 MG) PO TBCR
10.0000 meq | EXTENDED_RELEASE_TABLET | Freq: Every day | ORAL | 1 refills | Status: DC
Start: 2021-08-12 — End: 2022-02-07

## 2021-08-12 NOTE — Assessment & Plan Note (Addendum)
Physical today 08/12/21.  Is followed by urology (Dr Raynelle Bring) for prostate checks.  Overdue colonoscopy.  Discussed schedule with Dr Alain Marion.

## 2021-08-12 NOTE — Progress Notes (Signed)
Patient ID: Cal Gindlesperger., male   DOB: January 25, 1956, 66 y.o.   MRN: [id number]   Subjective:    Patient ID: Jader Desai., male    DOB: [birth year], 66 y.o.   MRN: [id number]   Patient here for his physical.   HPI Reports he feels he is doing better.  Feels his current medication regimen is working well.  In the process of changing to a different psychiatrist.  I am refilling his medication through this transition time.  Feels he is handling stress relatively well.  Better control of emotions.  Tries to stay active.  No chest pain or sob reported.  Had cataract surgery two weeks ago.  Did well with surgery.  Continues f/u with ophthalmology.  No increased cough or congestion.  No abdominal pain or bowel change.  Saw Dr Rockey Situ 06/26/21.  Plavix stopped and now taking $RemoveBe'81mg'guhIqsRtk$  aspirin.  No other changes made.    Past Medical History:  Diagnosis Date   Acid reflux    Angina 2013   no current problems   Anxiety    Arthritis    Bipolar disorder ([place])    type 2   Depression    Diabetes mellitus without complication ([place])    type 2   Diastasis of muscle 2011   "abdominal"   Dysplastic polyp of colon 2008   [facility]   GERD (gastroesophageal reflux disease)    Gout    Multiple episodes - no current problems   Heart disease    History of kidney stones    surgery to remove stones   Hyperlipidemia    Hypertension    Myocardial infarction ([place]) 05/2011   "we think";  06/2011 abnl mv;  06/2011 Cath - subtotal occlusion of Ramus, otw nonobs dzs;  06/2011 PCI/DES of Ramus w/ 2.25x62mm Resolute DES    Sleep apnea    uses CPAP nightly   Syncopal episodes 05/2020   Syncope 05/2020   Treadmill stress test negative for angina pectoris 2011   Past Surgical History:  Procedure Laterality Date   25 GAUGE PARS PLANA VITRECTOMY WITH 20 GAUGE MVR PORT FOR MACULAR HOLE Right 07/19/2020   Procedure: 25 GAUGE [place];  Surgeon: Bernarda Caffey, MD;  Location: [facility];  Service:  Ophthalmology;  Laterality: Right;   CARDIAC CATHETERIZATION  06/2011   "diagnostic"   COLONOSCOPY     CORONARY ANGIOPLASTY WITH STENT PLACEMENT  07/09/11   "1"   GAS/FLUID EXCHANGE Right 07/19/2020   Procedure: GAS/FLUID EXCHANGE;  Surgeon: Bernarda Caffey, MD;  Location: [facility];  Service: Ophthalmology;  Laterality: Right;  c3 f8   HEMORRHOID SURGERY  2000   INGUINAL HERNIA REPAIR  1987   left   LEFT HEART CATHETERIZATION WITH CORONARY ANGIOGRAM N/A 10/21/2011   Procedure: LEFT HEART CATHETERIZATION WITH CORONARY ANGIOGRAM;  Surgeon: Hillary Bow, MD;  Location[facility] LAB;  Service: Cardiovascular;  Laterality: N/A;   LITHOTRIPSY  10/2008   MEMBRANE PEEL Right 07/19/2020   Procedure: MEMBRANE PEEL;  Surgeon: Bernarda Caffey, MD;  Location: [facility];  Service: Ophthalmology;  Laterality: Right;   PERCUTANEOUS CORONARY STENT INTERVENTION (PCI-S) N/A 07/09/2011   Procedure: PERCUTANEOUS CORONARY STENT INTERVENTION (PCI-S);  Surgeon: Sherren Mocha, MD;  Location[facility] C[facility] LAB;  Service: Cardiovascular;  Laterality: N/A;   PHOTOCOAGULATION WITH LASER Right 07/19/2020   Procedure: PHOTOCOAGULATION WITH LASER;  Surgeon: Bernarda Caffey, MD;  Location: [facility];  Service: Ophthalmology;  Laterality: Right;   UPPER  GI ENDOSCOPY     URETEROSCOPY  10/2009   Family History  Problem Relation Age of Onset   Heart attack Father 4       LVAD   Hypertension Other    Social History   Socioeconomic History   Marital status: Married    Spouse name: Not on file   Number of children: 4   Years of education: Not on file   Highest education level: Not on file  Occupational History   Occupation: Conservation officer, nature  Tobacco Use   Smoking status: Former    Packs/day: 0.50    Years: 20.00    Total pack years: 10.00    Types: Cigarettes    Quit date: 02/17/1990    Years since quitting: 31.5   Smokeless tobacco: Never  Vaping Use   Vaping Use: Never used  Substance and Sexual Activity   Alcohol use: Not  Currently    Alcohol/week: 4.0 standard drinks of alcohol    Types: 2 Glasses of wine, 2 Cans of beer per week   Drug use: Yes    Frequency: 7.0 times per week    Types: Marijuana    Comment: 07/09/11 "lets say a cigarette a day" Last used   Sexual activity: Yes  Other Topics Concern   Not on file  Social History Narrative   Married and has 4 children.     Social Determinants of Health   Financial Resource Strain: Not on file  Food Insecurity: Not on file  Transportation Needs: Not on file  Physical Activity: Not on file  Stress: Not on file  Social Connections: Not on file     Review of Systems  Constitutional:  Negative for appetite change and unexpected weight change.  HENT:  Negative for congestion, sinus pressure and sore throat.   Eyes:  Negative for pain and visual disturbance.  Respiratory:  Negative for cough, chest tightness and shortness of breath.   Cardiovascular:  Negative for chest pain, palpitations and leg swelling.  Gastrointestinal:  Negative for abdominal pain, diarrhea, nausea and vomiting.  Genitourinary:  Negative for difficulty urinating and dysuria.  Musculoskeletal:  Negative for joint swelling and myalgias.  Skin:  Negative for color change and rash.  Neurological:  Negative for dizziness, light-headedness and headaches.  Hematological:  Negative for adenopathy. Does not bruise/bleed easily.  Psychiatric/Behavioral:  Negative for agitation and dysphoric mood.        Objective:     BP 128/72   Pulse 70   Temp 98.5 F (36.9 C) (Oral)   Resp 17   Ht $R'5\' 10"'eH$  (1.778 m)   Wt 209 lb 12.8 oz (95.2 kg)   SpO2 96%   BMI 30.10 kg/m  Wt Readings from Last 3 Encounters:  08/12/21 209 lb 12.8 oz (95.2 kg)  06/26/21 207 lb 6 oz (94.1 kg)  05/07/21 213 lb 6.4 oz (96.8 kg)    Physical Exam Constitutional:      General: He is not in acute distress.    Appearance: Normal appearance. He is well-developed.  HENT:     Head: Normocephalic and  atraumatic.     Right Ear: External ear normal.     Left Ear: External ear normal.  Eyes:     General: No scleral icterus.       Right eye: No discharge.        Left eye: No discharge.     Conjunctiva/sclera: Conjunctivae normal.  Neck:     Thyroid: No thyromegaly.  Cardiovascular:     Rate and Rhythm: Normal rate and regular rhythm.  Pulmonary:     Effort: No respiratory distress.     Breath sounds: Normal breath sounds. No wheezing.  Abdominal:     General: Bowel sounds are normal.     Palpations: Abdomen is soft.     Tenderness: There is no abdominal tenderness.  Musculoskeletal:        General: No swelling or tenderness.     Cervical back: Neck supple. No tenderness.  Lymphadenopathy:     Cervical: No cervical adenopathy.  Skin:    Findings: No erythema or rash.  Neurological:     Mental Status: He is alert and oriented to person, place, and time.  Psychiatric:        Mood and Affect: Mood normal.        Behavior: Behavior normal.      Outpatient Encounter Medications as of 08/12/2021  Medication Sig   aspirin EC 81 MG tablet Take 1 tablet (81 mg total) by mouth daily. Swallow whole.   atorvastatin (LIPITOR) 20 MG tablet Take 1 tablet (20 mg total) by mouth daily.   bacitracin-polymyxin b (POLYSPORIN) ophthalmic ointment Place into the right eye at bedtime. Place a 1/2 inch ribbon of ointment into the lower eyelid.   BD HYPODERMIC NEEDLE 16G X 1" MISC FOR USE WITH TESTOSTERONE INJECTIONS   BD HYPODERMIC NEEDLE 18G X 1" MISC USE WITH TESTOSTERONE INJECTIONS   CINNAMON PO Take 1,000 mg by mouth 2 (two) times daily.    Cyanocobalamin (VITAMIN B-12) 5000 MCG SUBL Place 5,000 mcg under the tongue daily.   fluticasone (FLONASE) 50 MCG/ACT nasal spray USE 2 SPRAYS IN EACH NOSTRIL DAILY AS DIRECTED (Patient taking differently: Place 2 sprays into both nostrils at bedtime.)   glucose blood test strip Use as instructed to check sugars once daily. Dx E11.9   hydrOXYzine  (VISTARIL) 50 MG capsule TAKE ONE CAPSULE BY MOUTH TWICE A DAY   lamoTRIgine (LAMICTAL) 150 MG tablet Take 1 tablet (150 mg total) by mouth at bedtime.   lamoTRIgine (LAMICTAL) 200 MG tablet TAKE ONE TABLET BY MOUTH ONE TIME DAILY   Lancets (ONETOUCH ULTRASOFT) lancets Use as instructed to check sugars once daily. Dx E 11.9   magnesium oxide (MAG-OX) 400 MG tablet Take 400 mg by mouth daily.   olmesartan-hydrochlorothiazide (BENICAR HCT) 40-25 MG tablet Take 1 tablet by mouth at bedtime.   pantoprazole (PROTONIX) 40 MG tablet TAKE ONE TABLET BY MOUTH ONE TIME DAILY   polyethylene glycol powder (GLYCOLAX/MIRALAX) powder TAKE 17 GRAMS BY MOUTH EVERY DAY as needed (as directed) (Patient taking differently: Take 17 g by mouth daily.)   prednisoLONE acetate (PRED FORTE) 1 % ophthalmic suspension Place 1 drop into the right eye 4 (four) times daily.   Syringe, Disposable, (B-D SYRINGE LUER-LOK 3CC) 3 ML MISC 1 mL by Does not apply route every 14 (fourteen) days.   SYRINGE-NEEDLE, DISP, 3 ML (B-D 3CC LUER-LOK SYR 22GX1") 22G X 1" 3 ML MISC FOR TESTOSTERONE INJECTIONS   tamsulosin (FLOMAX) 0.4 MG CAPS capsule TAKE ONE CAPSULE BY MOUTH ONE TIME DAILY   testosterone cypionate (DEPOTESTOSTERONE CYPIONATE) 200 MG/ML injection Inject 0.7 mLs (140 mg total) into the muscle every 7 (seven) days. (Patient taking differently: Inject 140 mg into the muscle every 7 (seven) days. Every Monday)   traZODone (DESYREL) 100 MG tablet Take 2 tablets (200 mg total) by mouth at bedtime.   venlafaxine XR (EFFEXOR-XR) 150 MG 24 hr  capsule TAKE ONE CAPSULE BY MOUTH ONE TIME DAILY WITH BREAKFAST   VITAMIN D, CHOLECALCIFEROL, PO Take 2,000 mg by mouth daily.   VITAMIN E PO Take 2,000 Units by mouth daily.   zinc gluconate 50 MG tablet Take 50 mg by mouth daily.   [DISCONTINUED] potassium citrate (UROCIT-K) 10 MEQ (1080 MG) SR tablet TAKE ONE TABLET BY MOUTH ONE TIME DAILY   potassium citrate (UROCIT-K) 10 MEQ (1080 MG) SR tablet  Take 1 tablet (10 mEq total) by mouth daily.   No facility-administered encounter medications on file as of 08/12/2021.     Lab Results  Component Value Date   WBC 8.1 08/05/2021   HGB 16.1 08/05/2021   HCT 47.8 08/05/2021   PLT 168.0 08/05/2021   GLUCOSE 90 08/05/2021   CHOL 123 08/05/2021   TRIG 124.0 08/05/2021   HDL 30.40 (L) 08/05/2021   LDLDIRECT 87.0 06/05/2020   LDLCALC 68 08/05/2021   ALT 24 08/05/2021   AST 26 08/05/2021   NA 139 08/05/2021   K 4.2 08/05/2021   CL 102 08/05/2021   CREATININE 1.16 08/05/2021   BUN 23 08/05/2021   CO2 30 08/05/2021   TSH 2.00 05/02/2021   PSA 0.38 08/05/2021   INR 1.11 10/19/2011   HGBA1C 6.0 08/05/2021   MICROALBUR <0.7 10/09/2020       Assessment & Plan:   Problem List Items Addressed This Visit     Anxiety    Has been followed by Dr Nicolasa Ducking as outlined.  States due to insurance change - no longer going to see. have discussed need for f/up with psychiatry given psych history and current medications.  Waiting for appt to be scheduled with psychiatry.  Doing well on current regimen.       Benign prostatic hyperplasia with lower urinary tract symptoms    Seeing Dr Raynelle Bring continue tamsulosin.   [facility] (Dr Raynelle Bring) evaluated  - 02/20/21 - continue potassium citrate 70meq daily.  F/u one year - KUB.  Continue tamsulosin.  Follow hgb.        Bipolar 2 disorder ([place])    Has been followed by Dr Nicolasa Ducking as outlined.  States due to insurance change - no longer going to see. have discussed need for f/up with psychiatry given psych history and current medications.  Waiting for appt to be scheduled with psychiatry.  Doing well on current regimen.       CAD (coronary artery disease)    Followed by Dr Rockey Situ.  No chest pain or sob reported.  Continue risk factor modification.       Diabetes mellitus with cardiac complication (HCC)    Low carb diet and exercise. Follow met b and a1c.        Relevant Orders    Hemoglobin F1Q   Basic metabolic panel   Microalbumin / creatinine urine ratio   GERD (gastroesophageal reflux disease)    No upper symptoms reported.  Continue protonix.       Health care maintenance    Physical today 08/12/21.  Is followed by urology (Dr Raynelle Bring) for prostate checks.  Overdue colonoscopy.  Discussed schedule with Dr Alain Marion.       History of colonic polyps    Colonoscopy 08/23/13.  One 29mm polyp in the rectum.  A tattoo was seen in the transverse colon.  Post-polypectomy scar found at tatoo site.  No residual polyp tissue.  Internal hemorrhoids. Overdue f/u colonoscopy.  Schedule with Dr Alain Marion.  Hyperlipidemia    Continue lipitor.  Low cholesterol diet and exercise.  Follow lipid panel and liver function tests.  Triglycerides improved from last check.   Lab Results  Component Value Date   CHOL 123 08/05/2021   HDL 30.40 (L) 08/05/2021   LDLCALC 68 08/05/2021   LDLDIRECT 87.0 06/05/2020   TRIG 124.0 08/05/2021   CHOLHDL 4 08/05/2021       Relevant Orders   CBC with Differential/Platelet   Hepatic function panel   Lipid panel   Hypertension    Continue Benicar/hxtz.  Follow pressures.  Follow metabolic panel.       Relevant Orders   Basic metabolic panel   Urinalysis, Routine w reflex microscopic   Sleep apnea    States his machine is 52-30 years old.  Discussed need for new machine.  Will contact company and see what is needed to get a new machine.  Uses regularly.  Benefiting from use.        Stable angina (HCC)    No chest pain.  Followed by cardiology.  On benicar/hctz, aspirin and statin medication.  Follow.        Thrombocytopenia ([place])    Follow cbc. Platelet count wnl recent checks.       Other Visit Diagnoses     Routine general medical examination at a health care facility    -  Primary   Colon cancer screening       Relevant Orders   Ambulatory referral to Gastroenterology        Einar Pheasant, MD

## 2021-08-13 DIAGNOSIS — H269 Unspecified cataract: Secondary | ICD-10-CM | POA: Diagnosis not present

## 2021-08-13 DIAGNOSIS — H2512 Age-related nuclear cataract, left eye: Secondary | ICD-10-CM | POA: Diagnosis not present

## 2021-08-16 ENCOUNTER — Encounter: Payer: Self-pay | Admitting: Internal Medicine

## 2021-08-17 NOTE — Assessment & Plan Note (Addendum)
Continue Benicar/hxtz.  Follow pressures.  Follow metabolic panel.

## 2021-08-17 NOTE — Assessment & Plan Note (Signed)
Has been followed by Dr Nicolasa Ducking as outlined.  States due to insurance change - no longer going to see. have discussed need for f/up with psychiatry given psych history and current medications.  Waiting for appt to be scheduled with psychiatry.  Doing well on current regimen.

## 2021-08-17 NOTE — Assessment & Plan Note (Signed)
Continue lipitor.  Low cholesterol diet and exercise.  Follow lipid panel and liver function tests.  Triglycerides improved from last check.   Lab Results  Component Value Date   CHOL 123 08/05/2021   HDL 30.40 (L) 08/05/2021   LDLCALC 68 08/05/2021   LDLDIRECT 87.0 06/05/2020   TRIG 124.0 08/05/2021   CHOLHDL 4 08/05/2021

## 2021-08-17 NOTE — Assessment & Plan Note (Addendum)
No chest pain.  Followed by cardiology.  On benicar/hctz, aspirin and statin medication.  Follow.

## 2021-08-17 NOTE — Assessment & Plan Note (Signed)
No upper symptoms reported.  Continue protonix.  

## 2021-08-17 NOTE — Assessment & Plan Note (Signed)
Low carb diet and exercise. Follow met b and a1c.

## 2021-08-17 NOTE — Assessment & Plan Note (Addendum)
Seeing Dr Raynelle Bring continue tamsulosin.   [facility] (Dr Raynelle Bring) evaluated  - 02/20/21 - continue potassium citrate 4mq daily.  F/u one year - KUB.  Continue tamsulosin.  Follow hgb.

## 2021-08-17 NOTE — Assessment & Plan Note (Signed)
Followed by Dr Gollan.  No chest pain or sob reported.  Continue risk factor modification.  

## 2021-08-17 NOTE — Assessment & Plan Note (Signed)
States his machine is 74-64 years old.  Discussed need for new machine.  Will contact company and see what is needed to get a new machine.  Uses regularly.  Benefiting from use.

## 2021-08-17 NOTE — Assessment & Plan Note (Signed)
Colonoscopy 08/23/13.  One 44m polyp in the rectum.  A tattoo was seen in the transverse colon.  Post-polypectomy scar found at tatoo site.  No residual polyp tissue.  Internal hemorrhoids. Overdue f/u colonoscopy.  Schedule with Dr TAlain Marion

## 2021-08-17 NOTE — Assessment & Plan Note (Addendum)
Follow cbc. Platelet count wnl recent checks.  

## 2021-08-23 ENCOUNTER — Other Ambulatory Visit: Payer: Self-pay | Admitting: Internal Medicine

## 2021-08-23 DIAGNOSIS — H2512 Age-related nuclear cataract, left eye: Secondary | ICD-10-CM | POA: Diagnosis not present

## 2021-08-29 ENCOUNTER — Telehealth: Payer: Self-pay | Admitting: Internal Medicine

## 2021-08-29 NOTE — Telephone Encounter (Signed)
I had messaged Rasheedah about referral to psychiatry.  She messaged me back as outlined.  Has not heard from Mr Schreur or psychiatry.  He had mentioned to me he was ok to see someone in town.  I would like to refer him to psychiatry here in town if agreeable.  Let me know and I can place order for referral.

## 2021-08-29 NOTE — Telephone Encounter (Signed)
-----   Message from Ashley Jacobs sent at 08/28/2021 10:35 AM EDT ----- Regarding: RE: psychiatry appt Good morning!  Happy Wednesday!  I left Dr Sherlyn Hay a vm on 07/10/2021 I have not received a call from him. I left pt a vm to call as well to let me know if he has heard from him and if he was able to get scheduled. Yw!  ----- Message ----- From: Einar Pheasant, MD Sent: 07/20/2021   1:26 PM EDT To: Ashley Jacobs Subject: RE: psychiatry appt                            Have you heard anything about this referral?   Let me know if I need to do anything.  Thank you.   Dr Nicki Reaper ----- Message ----- From: Esau Grew R Sent: 07/10/2021  10:45 AM EDT To: Einar Pheasant, MD Subject: RE: psychiatry appt                            I left vm for Dr Eleonore Chiquito to call me to f/u on referral.  ----- Message ----- From: Einar Pheasant, MD Sent: 06/21/2021   9:48 PM EDT To: Ashley Jacobs Subject: psychiatry appt                                I had placed an order for a referral to psychiatry.  He reports he has not heard regarding an appt.  Can you help with this?    Thank you.  Dr Nicki Reaper

## 2021-08-30 NOTE — Telephone Encounter (Signed)
Lm for pt to cb.

## 2021-09-02 NOTE — Telephone Encounter (Signed)
Lm for pt to cb.

## 2021-09-09 NOTE — Telephone Encounter (Signed)
Lm for pt to cb x 3 - letter sent

## 2021-09-16 ENCOUNTER — Other Ambulatory Visit: Payer: Self-pay | Admitting: Internal Medicine

## 2021-09-24 ENCOUNTER — Encounter: Payer: Self-pay | Admitting: Internal Medicine

## 2021-09-24 MED ORDER — TRAZODONE HCL 100 MG PO TABS: 200.0000 mg | ORAL_TABLET | Freq: Every day | ORAL | 0 refills | Status: DC

## 2021-09-24 NOTE — Telephone Encounter (Signed)
Rx ok'd for trazodone x 1 month.  Will need to keep appt with psychiatry.

## 2021-09-25 NOTE — Telephone Encounter (Signed)
Left message for patient to call office.  

## 2021-09-25 NOTE — Telephone Encounter (Signed)
Patient called back he says he is not going to do this no pulmonary or new sleep study, he simply wants to CPAP orders one for at home and one for travel he is paying out of pocket , his machine is auto titrated and he wants his new machine to be auto titrated he will

## 2021-09-25 NOTE — Telephone Encounter (Signed)
Please call and notify Keith Gross that if his machine is 65 years old, he needs to have a more updated evaluation of his sleep.  I would like to refer him to pulmonary for evaluation/follow up of sleep apnea.  Probably needs a more recent home sleep test to confirm appropriate settings, etc.  If agreeable, let me know and I can place order for referral.

## 2021-09-26 NOTE — Telephone Encounter (Signed)
Can do a prescription for his cpap.  Just need to clarify specifically what is needed. He states is auto titration - need to confirm settings - is it 5-15 for example.

## 2021-09-26 NOTE — Telephone Encounter (Signed)
Duplicate.  See attached.   

## 2021-10-01 ENCOUNTER — Other Ambulatory Visit: Payer: Self-pay

## 2021-10-01 DIAGNOSIS — G4730 Sleep apnea, unspecified: Secondary | ICD-10-CM

## 2021-10-03 NOTE — Telephone Encounter (Signed)
[facility] called stating they did not receive fax. This is another number to fax to [phone]

## 2021-10-08 NOTE — Telephone Encounter (Signed)
Faxed rx to second fax # - [phone]

## 2021-10-23 ENCOUNTER — Other Ambulatory Visit: Payer: Self-pay | Admitting: Internal Medicine

## 2021-10-24 ENCOUNTER — Ambulatory Visit: Payer: Medicare HMO | Admitting: Psychiatry

## 2021-11-04 DIAGNOSIS — I10 Essential (primary) hypertension: Secondary | ICD-10-CM | POA: Diagnosis not present

## 2021-11-04 DIAGNOSIS — H35372 Puckering of macula, left eye: Secondary | ICD-10-CM | POA: Diagnosis not present

## 2021-11-04 DIAGNOSIS — H18413 Arcus senilis, bilateral: Secondary | ICD-10-CM | POA: Diagnosis not present

## 2021-11-04 DIAGNOSIS — H35352 Cystoid macular degeneration, left eye: Secondary | ICD-10-CM | POA: Diagnosis not present

## 2021-11-05 DIAGNOSIS — H43812 Vitreous degeneration, left eye: Secondary | ICD-10-CM | POA: Diagnosis not present

## 2021-11-05 DIAGNOSIS — H35342 Macular cyst, hole, or pseudohole, left eye: Secondary | ICD-10-CM | POA: Diagnosis not present

## 2021-11-05 DIAGNOSIS — H43392 Other vitreous opacities, left eye: Secondary | ICD-10-CM | POA: Diagnosis not present

## 2021-11-05 DIAGNOSIS — H35372 Puckering of macula, left eye: Secondary | ICD-10-CM | POA: Diagnosis not present

## 2021-11-07 ENCOUNTER — Other Ambulatory Visit (INDEPENDENT_AMBULATORY_CARE_PROVIDER_SITE_OTHER): Payer: Medicare HMO

## 2021-11-07 DIAGNOSIS — I10 Essential (primary) hypertension: Secondary | ICD-10-CM

## 2021-11-07 DIAGNOSIS — E782 Mixed hyperlipidemia: Secondary | ICD-10-CM

## 2021-11-07 DIAGNOSIS — E1159 Type 2 diabetes mellitus with other circulatory complications: Secondary | ICD-10-CM | POA: Diagnosis not present

## 2021-11-07 LAB — CBC WITH DIFFERENTIAL/PLATELET
Basophils Absolute: 0.1 10*3/uL (ref 0.0–0.1)
Basophils Relative: 0.8 % (ref 0.0–3.0)
Eosinophils Absolute: 0.1 10*3/uL (ref 0.0–0.7)
Eosinophils Relative: 1.6 % (ref 0.0–5.0)
HCT: 49.6 % (ref 39.0–52.0)
Hemoglobin: 16.7 g/dL (ref 13.0–17.0)
Lymphocytes Relative: 17.8 % (ref 12.0–46.0)
Lymphs Abs: 1.3 10*3/uL (ref 0.7–4.0)
MCHC: 33.7 g/dL (ref 30.0–36.0)
MCV: 92.6 fl (ref 78.0–100.0)
Monocytes Absolute: 0.5 10*3/uL (ref 0.1–1.0)
Monocytes Relative: 7.6 % (ref 3.0–12.0)
Neutro Abs: 5.1 10*3/uL (ref 1.4–7.7)
Neutrophils Relative %: 72.2 % (ref 43.0–77.0)
Platelets: 175 10*3/uL (ref 150.0–400.0)
RBC: 5.36 Mil/uL (ref 4.22–5.81)
RDW: 14.1 % (ref 11.5–15.5)
WBC: 7 10*3/uL (ref 4.0–10.5)

## 2021-11-07 LAB — URINALYSIS, ROUTINE W REFLEX MICROSCOPIC
Bilirubin Urine: NEGATIVE
Hgb urine dipstick: NEGATIVE
Ketones, ur: NEGATIVE
Leukocytes,Ua: NEGATIVE
Nitrite: NEGATIVE
RBC / HPF: NONE SEEN (ref 0–?)
Specific Gravity, Urine: 1.02 (ref 1.000–1.030)
Total Protein, Urine: NEGATIVE
Urine Glucose: NEGATIVE
Urobilinogen, UA: 0.2 (ref 0.0–1.0)
pH: 6 (ref 5.0–8.0)

## 2021-11-07 LAB — HEPATIC FUNCTION PANEL
ALT: 32 U/L (ref 0–53)
AST: 29 U/L (ref 0–37)
Albumin: 4.4 g/dL (ref 3.5–5.2)
Alkaline Phosphatase: 46 U/L (ref 39–117)
Bilirubin, Direct: 0.1 mg/dL (ref 0.0–0.3)
Total Bilirubin: 0.5 mg/dL (ref 0.2–1.2)
Total Protein: 6.9 g/dL (ref 6.0–8.3)

## 2021-11-07 LAB — LIPID PANEL
Cholesterol: 113 mg/dL (ref 0–200)
HDL: 29 mg/dL — ABNORMAL LOW (ref >39.00)
LDL Cholesterol: 61 mg/dL (ref 0–99)
NonHDL: 83.61
Total CHOL/HDL Ratio: 4
Triglycerides: 111 mg/dL (ref 0.0–149.0)
VLDL: 22.2 mg/dL (ref 0.0–40.0)

## 2021-11-07 LAB — BASIC METABOLIC PANEL
BUN: 22 mg/dL (ref 6–23)
CO2: 30 mEq/L (ref 19–32)
Calcium: 9.3 mg/dL (ref 8.4–10.5)
Chloride: 101 mEq/L (ref 96–112)
Creatinine, Ser: 1.26 mg/dL (ref 0.40–1.50)
GFR: 59.96 mL/min — ABNORMAL LOW (ref >60.00)
Glucose, Bld: 126 mg/dL — ABNORMAL HIGH (ref 70–99)
Potassium: 4.3 mEq/L (ref 3.5–5.1)
Sodium: 138 mEq/L (ref 135–145)

## 2021-11-07 LAB — MICROALBUMIN / CREATININE URINE RATIO
Creatinine,U: 147.5 mg/dL
Microalb Creat Ratio: 1.4 mg/g (ref 0.0–30.0)
Microalb, Ur: 2.1 mg/dL — ABNORMAL HIGH (ref 0.0–1.9)

## 2021-11-11 LAB — HEMOGLOBIN A1C: Hgb A1c MFr Bld: 6.5 % (ref 4.6–6.5)

## 2021-11-12 ENCOUNTER — Encounter: Payer: Self-pay | Admitting: Internal Medicine

## 2021-11-12 ENCOUNTER — Ambulatory Visit (INDEPENDENT_AMBULATORY_CARE_PROVIDER_SITE_OTHER): Payer: Medicare HMO | Admitting: Internal Medicine

## 2021-11-12 DIAGNOSIS — I25118 Atherosclerotic heart disease of native coronary artery with other forms of angina pectoris: Secondary | ICD-10-CM | POA: Diagnosis not present

## 2021-11-12 DIAGNOSIS — I10 Essential (primary) hypertension: Secondary | ICD-10-CM

## 2021-11-12 DIAGNOSIS — Z860100 Personal history of colon polyps, unspecified: Secondary | ICD-10-CM

## 2021-11-12 DIAGNOSIS — F3181 Bipolar II disorder: Secondary | ICD-10-CM

## 2021-11-12 DIAGNOSIS — K2100 Gastro-esophageal reflux disease with esophagitis, without bleeding: Secondary | ICD-10-CM | POA: Diagnosis not present

## 2021-11-12 DIAGNOSIS — E782 Mixed hyperlipidemia: Secondary | ICD-10-CM | POA: Diagnosis not present

## 2021-11-12 DIAGNOSIS — E1159 Type 2 diabetes mellitus with other circulatory complications: Secondary | ICD-10-CM | POA: Diagnosis not present

## 2021-11-12 DIAGNOSIS — G4730 Sleep apnea, unspecified: Secondary | ICD-10-CM | POA: Diagnosis not present

## 2021-11-12 DIAGNOSIS — R69 Illness, unspecified: Secondary | ICD-10-CM | POA: Diagnosis not present

## 2021-11-12 LAB — HM DIABETES FOOT EXAM

## 2021-11-12 MED ORDER — AMLODIPINE BESYLATE 5 MG PO TABS: 5.0000 mg | ORAL_TABLET | Freq: Every day | ORAL | 3 refills | Status: DC

## 2021-11-12 NOTE — Progress Notes (Signed)
Patient ID: Keith Arriaga., male   DOB: [birth year], 66 y.o.   MRN: [id number]   Subjective:    Patient ID: Keith Bowring., male    DOB: 1955-11-06, 66 y.o.   MRN: [id number]   Patient here for  Chief Complaint  Patient presents with   Follow-up    3 month follow up   .   HPI Here to follow up regarding his blood sugar, blood pressure and cholesterol.  Reports he is doing well. Feels good. No chest pain reported.  Breathing stable.  No acid reflux.  No abdominal pain. No bowel change reported.  Medication working well.  Scheduled for colonoscopy in 01/2022.  Scheduled to see psychiatry - 01/2022.    Past Medical History:  Diagnosis Date   Acid reflux    Angina 2013   no current problems   Anxiety    Arthritis    Bipolar disorder ([place])    type 2   Depression    Diabetes mellitus without complication ([place])    type 2   Diastasis of muscle 2011   "abdominal"   Dysplastic polyp of colon 2008   [facility]   GERD (gastroesophageal reflux disease)    Gout    Multiple episodes - no current problems   Heart disease    History of kidney stones    surgery to remove stones   Hyperlipidemia    Hypertension    Myocardial infarction ([place]) 05/2011   "we think";  06/2011 abnl mv;  06/2011 Cath - subtotal occlusion of Ramus, otw nonobs dzs;  06/2011 PCI/DES of Ramus w/ 2.25x71m Resolute DES    Sleep apnea    uses CPAP nightly   Syncopal episodes 05/2020   Syncope 05/2020   Treadmill stress test negative for angina pectoris 2011   Past Surgical History:  Procedure Laterality Date   25 GAUGE PARS PLANA VITRECTOMY WITH 20 GAUGE MVR PORT FOR MACULAR HOLE Right 07/19/2020   Procedure: 25 GAUGE PElmo  Surgeon: ZBernarda Caffey MD;  [place] [facility]  Service: Ophthalmology;  Laterality: Right;   CARDIAC CATHETERIZATION  06/2011   "diagnostic"   COLONOSCOPY     CORONARY ANGIOPLASTY WITH STENT PLACEMENT  07/09/11   "1"   GAS/FLUID EXCHANGE Right 07/19/2020    Procedure: GAS/FLUID EXCHANGE;  Surgeon: ZBernarda Caffey MD;  [place] [facility]  Service: Ophthalmology;  Laterality: Right;  c3 f8   HEMORRHOID SURGERY  2000   INGUINAL HERNIA REPAIR  1987   left   LEFT HEART CATHETERIZATION WITH CORONARY ANGIOGRAM N/A 10/21/2011   Procedure: LEFT HEART CATHETERIZATION WITH CORONARY ANGIOGRAM;  Surgeon: THillary Bow MD;  [facility];  Service: Cardiovascular;  Laterality: N/A;   LITHOTRIPSY  10/2008   MEMBRANE PEEL Right 07/19/2020   Procedure: MEMBRANE PEEL;  Surgeon: ZBernarda Caffey MD;  [place] [facility]  Service: Ophthalmology;  Laterality: Right;   PERCUTANEOUS CORONARY STENT INTERVENTION (PCI-S) N/A 07/09/2011   Procedure: PERCUTANEOUS CORONARY STENT INTERVENTION (PCI-S);  Surgeon: MSherren Mocha MD;  [facility];  Service: Cardiovascular;  Laterality: N/A;   PHOTOCOAGULATION WITH LASER Right 07/19/2020   Procedure: PHOTOCOAGULATION WITH LASER;  Surgeon: ZBernarda Caffey MD;  [place] [facility]  Service: Ophthalmology;  Laterality: Right;   UPPER GI ENDOSCOPY     URETEROSCOPY  10/2009   Family History  Problem Relation Age of Onset   Heart attack Father 461      LVAD   Hypertension  Other    Social History   Socioeconomic History   Marital status: Married    Spouse name: Not on file   Number of children: 4   Years of education: Not on file   Highest education level: Not on file  Occupational History   Occupation: Conservation officer, [employer]  Tobacco Use   Smoking status: Former    Packs/day: 0.50    Years: 20.00    Total pack years: 10.00    Types: Cigarettes    Quit date: 02/17/1990    Years since quitting: 31.7   Smokeless tobacco: Never  Vaping Use   Vaping Use: Never used  Substance and Sexual Activity   Alcohol use: Not Currently    Alcohol/week: 4.0 standard drinks of alcohol    Types: 2 Glasses of wine, 2 Cans of beer per week   Drug use: Yes    Frequency: 7.0 times per week    Types: Marijuana    Comment: 07/09/11  "lets say a cigarette a day" Last used   Sexual activity: Yes  Other Topics Concern   Not on file  Social History Narrative   Married and has 4 children.     Social Determinants of Health   Financial Resource Strain: Not on file  Food Insecurity: Not on file  Transportation Needs: Not on file  Physical Activity: Not on file  Stress: Not on file  Social Connections: Not on file     Review of Systems  Constitutional:  Negative for appetite change and unexpected weight change.  HENT:  Negative for congestion and sinus pressure.   Respiratory:  Negative for cough, chest tightness and shortness of breath.   Cardiovascular:  Negative for chest pain, palpitations and leg swelling.  Gastrointestinal:  Negative for abdominal pain, diarrhea, nausea and vomiting.  Genitourinary:  Negative for difficulty urinating and dysuria.  Musculoskeletal:  Negative for joint swelling and myalgias.  Skin:  Negative for color change and rash.  Neurological:  Negative for dizziness, light-headedness and headaches.  Psychiatric/Behavioral:  Negative for agitation and dysphoric mood.        Objective:     BP 130/70 (BP Location: Left Arm, Patient Position: Sitting, Cuff Size: Normal)   Pulse 66   Temp 98.9 F (37.2 C) (Oral)   Ht 6' (1.829 m)   Wt 211 lb 6.4 oz (95.9 kg)   SpO2 95%   BMI 28.67 kg/m  Wt Readings from Last 3 Encounters:  11/12/21 211 lb 6.4 oz (95.9 kg)  08/12/21 209 lb 12.8 oz (95.2 kg)  06/26/21 207 lb 6 oz (94.1 kg)    Physical Exam Constitutional:      General: He is not in acute distress.    Appearance: Normal appearance. He is well-developed.  HENT:     Head: Normocephalic and atraumatic.     Right Ear: External ear normal.     Left Ear: External ear normal.  Eyes:     General: No scleral icterus.       Right eye: No discharge.        Left eye: No discharge.  Cardiovascular:     Rate and Rhythm: Normal rate and regular rhythm.  Pulmonary:     Effort:  Pulmonary effort is normal. No respiratory distress.     Breath sounds: Normal breath sounds.  Abdominal:     General: Bowel sounds are normal.     Palpations: Abdomen is soft.     Tenderness: There is no abdominal tenderness.  Musculoskeletal:        General: No swelling or tenderness.     Cervical back: Neck supple. No tenderness.  Lymphadenopathy:     Cervical: No cervical adenopathy.  Skin:    Findings: No erythema or rash.  Neurological:     Mental Status: He is alert.  Psychiatric:        Mood and Affect: Mood normal.        Behavior: Behavior normal.      Outpatient Encounter Medications as of 11/12/2021  Medication Sig   amLODipine (NORVASC) 5 MG tablet Take 1 tablet (5 mg total) by mouth daily.   aspirin EC 81 MG tablet Take 1 tablet (81 mg total) by mouth daily. Swallow whole.   atorvastatin (LIPITOR) 20 MG tablet Take 1 tablet (20 mg total) by mouth daily.   bacitracin-polymyxin b (POLYSPORIN) ophthalmic ointment Place into the right eye at bedtime. Place a 1/2 inch ribbon of ointment into the lower eyelid.   BD HYPODERMIC NEEDLE 16G X 1" MISC FOR USE WITH TESTOSTERONE INJECTIONS   BD HYPODERMIC NEEDLE 18G X 1" MISC USE WITH TESTOSTERONE INJECTIONS   CINNAMON PO Take 1,000 mg by mouth 2 (two) times daily.    Cyanocobalamin (VITAMIN B-12) 5000 MCG SUBL Place 5,000 mcg under the tongue daily.   fluticasone (FLONASE) 50 MCG/ACT nasal spray USE 2 SPRAYS IN EACH NOSTRIL DAILY AS DIRECTED (Patient taking differently: Place 2 sprays into both nostrils at bedtime.)   glucose blood test strip Use as instructed to check sugars once daily. Dx E11.9   hydrOXYzine (VISTARIL) 50 MG capsule TAKE ONE CAPSULE BY MOUTH TWICE A DAY   lamoTRIgine (LAMICTAL) 150 MG tablet TAKE ONE TABLET BY MOUTH ONE TIME DAILY AT BEDTIME   lamoTRIgine (LAMICTAL) 200 MG tablet TAKE ONE TABLET BY MOUTH ONE TIME DAILY   Lancets (ONETOUCH ULTRASOFT) lancets Use as instructed to check sugars once daily. Dx E  11.9   magnesium oxide (MAG-OX) 400 MG tablet Take 400 mg by mouth daily.   olmesartan-hydrochlorothiazide (BENICAR HCT) 40-25 MG tablet Take 1 tablet by mouth at bedtime.   pantoprazole (PROTONIX) 40 MG tablet TAKE ONE TABLET BY MOUTH ONE TIME DAILY   polyethylene glycol powder (GLYCOLAX/MIRALAX) powder TAKE 17 GRAMS BY MOUTH EVERY DAY as needed (as directed) (Patient taking differently: Take 17 g by mouth daily.)   potassium citrate (UROCIT-K) 10 MEQ (1080 MG) SR tablet Take 1 tablet (10 mEq total) by mouth daily.   prednisoLONE acetate (PRED FORTE) 1 % ophthalmic suspension Place 1 drop into the right eye 4 (four) times daily.   Syringe, Disposable, (B-D SYRINGE LUER-LOK 3CC) 3 ML MISC 1 mL by Does not apply route every 14 (fourteen) days.   SYRINGE-NEEDLE, DISP, 3 ML (B-D 3CC LUER-LOK SYR 22GX1") 22G X 1" 3 ML MISC FOR TESTOSTERONE INJECTIONS   tamsulosin (FLOMAX) 0.4 MG CAPS capsule TAKE ONE CAPSULE BY MOUTH ONE TIME DAILY   testosterone cypionate (DEPOTESTOSTERONE CYPIONATE) 200 MG/ML injection Inject 0.7 mLs (140 mg total) into the muscle every 7 (seven) days. (Patient taking differently: Inject 140 mg into the muscle every 7 (seven) days. Every Monday)   traZODone (DESYREL) 100 MG tablet TAKE TWO TABLETS BY MOUTH AT BEDTIME   venlafaxine XR (EFFEXOR-XR) 150 MG 24 hr capsule TAKE ONE CAPSULE BY MOUTH ONE TIME DAILY WITH BREAKFAST   VITAMIN D, CHOLECALCIFEROL, PO Take 2,000 mg by mouth daily.   VITAMIN E PO Take 2,000 Units by mouth daily.   zinc gluconate  50 MG tablet Take 50 mg by mouth daily.   No facility-administered encounter medications on file as of 11/12/2021.     Lab Results  Component Value Date   WBC 7.0 11/07/2021   HGB 16.7 11/07/2021   HCT 49.6 11/07/2021   PLT 175.0 11/07/2021   GLUCOSE 126 (H) 11/07/2021   CHOL 113 11/07/2021   TRIG 111.0 11/07/2021   HDL 29.00 (L) 11/07/2021   LDLDIRECT 87.0 06/05/2020   LDLCALC 61 11/07/2021   ALT 32 11/07/2021   AST 29  11/07/2021   NA 138 11/07/2021   K 4.3 11/07/2021   CL 101 11/07/2021   CREATININE 1.26 11/07/2021   BUN 22 11/07/2021   CO2 30 11/07/2021   TSH 2.00 05/02/2021   PSA 0.38 08/05/2021   INR 1.11 10/19/2011   HGBA1C 6.5 11/07/2021   MICROALBUR 2.1 (H) 11/07/2021       Assessment & Plan:   Problem List Items Addressed This Visit     Bipolar 2 disorder ([place])    Had been followed by Dr Nicolasa Ducking as outlined.  States due to insurance change - no longer going to see. have discussed need for f/up with psychiatry given psych history and current medications.  Doing well on current regimen.  Scheduled to see psychiatry in 01/2022.       CAD (coronary artery disease)    Followed by Dr Rockey Situ.  No chest pain or sob reported.  Continue risk factor modification.       Relevant Medications   amLODipine (NORVASC) 5 MG tablet   Diabetes mellitus with cardiac complication (HCC)    Low carb diet and exercise. Follow met b and a1c.        Relevant Medications   amLODipine (NORVASC) 5 MG tablet   GERD (gastroesophageal reflux disease)    No upper symptoms reported.  Continue protonix.       History of colonic polyps    Colonoscopy 08/23/13.  One 41m polyp in the rectum.  A tattoo was seen in the transverse colon.  Post-polypectomy scar found at tatoo site.  No residual polyp tissue.  Internal hemorrhoids. Overdue f/u colonoscopy.  Scheduled with Dr TAlain Marion- in 01/2022.       Hyperlipidemia    Continue lipitor.  Low cholesterol diet and exercise.  Follow lipid panel and liver function tests.  Triglycerides improved from last check.   Lab Results  Component Value Date   CHOL 113 11/07/2021   HDL 29.00 (L) 11/07/2021   LDLCALC 61 11/07/2021   LDLDIRECT 87.0 06/05/2020   TRIG 111.0 11/07/2021   CHOLHDL 4 11/07/2021       Relevant Medications   amLODipine (NORVASC) 5 MG tablet   Hypertension    Continue Benicar/hctz.  Blood pressure elevated above goal.  States averaging 140/70-80.  Restart  amlodipine 585mq day. Follow pressures.  Follow metabolic panel.       Relevant Medications   amLODipine (NORVASC) 5 MG tablet   Sleep apnea    Using cpap.          ChEinar PheasantMD

## 2021-11-12 NOTE — Patient Instructions (Signed)
Start amlodipine '5mg'$  per day

## 2021-11-17 ENCOUNTER — Encounter: Payer: Self-pay | Admitting: Internal Medicine

## 2021-11-17 NOTE — Assessment & Plan Note (Signed)
Had been followed by Dr Nicolasa Ducking as outlined.  States due to insurance change - no longer going to see. have discussed need for f/up with psychiatry given psych history and current medications.  Doing well on current regimen.  Scheduled to see psychiatry in 01/2022.

## 2021-11-17 NOTE — Assessment & Plan Note (Signed)
Colonoscopy 08/23/13.  One 2mm polyp in the rectum.  A tattoo was seen in the transverse colon.  Post-polypectomy scar found at tatoo site.  No residual polyp tissue.  Internal hemorrhoids. Overdue f/u colonoscopy.  Scheduled with Dr Tang - in 01/2022.  

## 2021-11-17 NOTE — Assessment & Plan Note (Signed)
Using cpap

## 2021-11-17 NOTE — Assessment & Plan Note (Signed)
Continue lipitor.  Low cholesterol diet and exercise.  Follow lipid panel and liver function tests.  Triglycerides improved from last check.   Lab Results  Component Value Date   CHOL 113 11/07/2021   HDL 29.00 (L) 11/07/2021   LDLCALC 61 11/07/2021   LDLDIRECT 87.0 06/05/2020   TRIG 111.0 11/07/2021   CHOLHDL 4 11/07/2021   

## 2021-11-17 NOTE — Assessment & Plan Note (Signed)
Followed by Dr Gollan.  No chest pain or sob reported.  Continue risk factor modification.  

## 2021-11-17 NOTE — Assessment & Plan Note (Signed)
No upper symptoms reported.  Continue protonix.  

## 2021-11-17 NOTE — Assessment & Plan Note (Signed)
Continue Benicar/hctz.  Blood pressure elevated above goal.  States averaging 140/70-80.  Restart amlodipine '5mg'$  q day. Follow pressures.  Follow metabolic panel.

## 2021-11-17 NOTE — Assessment & Plan Note (Signed)
Low carb diet and exercise. Follow met b and a1c.

## 2021-12-02 ENCOUNTER — Other Ambulatory Visit: Payer: Self-pay | Admitting: Internal Medicine

## 2021-12-09 ENCOUNTER — Encounter: Payer: Self-pay | Admitting: Internal Medicine

## 2021-12-09 ENCOUNTER — Other Ambulatory Visit: Payer: Self-pay | Admitting: Internal Medicine

## 2021-12-09 MED ORDER — PANTOPRAZOLE SODIUM 40 MG PO TBEC: 40.0000 mg | DELAYED_RELEASE_TABLET | Freq: Every day | ORAL | 0 refills | Status: DC

## 2021-12-17 NOTE — Progress Notes (Unsigned)
Psychiatric Initial Adult Assessment   Patient Identification: Keith Gross. MRN:  [id number] Date of Evaluation:  12/19/2021 Referral Source: Einar Pheasant, MD  Chief Complaint:   Chief Complaint  Patient presents with   Establish Care   Visit Diagnosis:    ICD-10-CM   1. Bipolar II disorder ([address])  F31.81     2. GAD (generalized anxiety disorder)  F41.1     3. Insomnia, unspecified type  G47.00       History of Present Illness:   Keith Lapage. is a 66 y.o. year old male with a history of Bipolar II disorder, CAD, diabetes, GERD, hyperlipidemia, who is referred for bipolar II disorder.   He states that he has been stable on the current medication regimen for the past few years.  He could not that continue to see Dr. Nicolasa Ducking since he is on Medicare.  He states that he was diagnosed with bipolar disorder when he saw Dr. Nicolasa Ducking around 2015. He started to have anxiety, "outburst of emotion" since he had CAD in 2013. He was feeling agitated and excited that time.  He has been doing better since adjustment of the medication.  He states that he is having good life.  He has been married for 33 years.  He works with his wife/they are self-employed at [employer].  He loves the work.  It has been the most stressful time around this year.  He has been working 13 hours. He enjoys hearing life story of customers.  They have a house in [place].  They have 4 children and grandchildren.  He wonders if he can lower the dose of venlafaxine as he is concerned of blood pressure.  After having discussion, he agreed to stay on this medication at this time given his anxiety, which requires him to take hydroxyzine every day.    He denies feeling depressed.  He sleeps seven hours, using CPAP machine. He denies SI, HI. He reports anxiety in the morning, and takes hydroxyzine before going to work.   Bipolar-he was first seen by Dr. Nicolasa Ducking, who diganosed him with bipolar disorder.  He adamantly  denies any history of decreased need for sleep.  Although he denies euphonia, he states that he is outgoing, and he is not afraid of anything.  He denies increased goal directed behavior.  He denies any violence in the past.   Substance-although he reports he experienced everything in the past when he was young, he denied any alcohol use, drug use or cigarette use for many years.   Medication- venlafaxine 150 mg daily, lamotrigine 150 mg in AM, 200 mg in PM, Trazodone 200 mg at night, hydroxyzine 50 mg daily.  He states that he has been stable on the current medication regimen.  He takes hydroxyzine as scheduled, and has never tried to take as needed.    Support:wife Household: wife Marital status:married for 33 years. Second marriage Number of children: 4  Employment: self employed, Human resources officer for 40 years Education:   Last PCP / ongoing medical evaluation:   Wt Readings from Last 3 Encounters:  12/19/21 205 lb 12.8 oz (93.4 kg)  11/12/21 211 lb 6.4 oz (95.9 kg)  08/12/21 209 lb 12.8 oz (95.2 kg)     Associated Signs/Symptoms: Depression Symptoms:   denies (Hypo) Manic Symptoms:   denies decreased need for sleep, euphoria Anxiety Symptoms:   mild anxiety Psychotic Symptoms:   denies AH, VH, paranoia PTSD Symptoms: Negative  Past Psychiatric  History:  Outpatient: Dr. Nicolasa Ducking Psychiatry admission: denies Previous suicide attempt: denies Past trials of medication: venlafaxine, lamotrigine, trazodone History of violence:  denies   Previous Psychotropic Medications: Yes   Substance Abuse History in the last 12 months:  No.  Consequences of Substance Abuse: NA  Past Medical History:  Past Medical History:  Diagnosis Date   Acid reflux    Angina 2013   no current problems   Anxiety    Arthritis    Bipolar disorder ([place])    type 2   Depression    Diabetes mellitus without complication ([place])    type 2   Diastasis of muscle 2011   "abdominal"   Dysplastic polyp of  colon 2008   [facility]   GERD (gastroesophageal reflux disease)    Gout    Multiple episodes - no current problems   Heart disease    History of kidney stones    surgery to remove stones   Hyperlipidemia    Hypertension    Myocardial infarction ([place]) 05/2011   "we think";  06/2011 abnl mv;  06/2011 Cath - subtotal occlusion of Ramus, otw nonobs dzs;  06/2011 PCI/DES of Ramus w/ 2.25x55m Resolute DES    Sleep apnea    uses CPAP nightly   Syncopal episodes 05/2020   Syncope 05/2020   Treadmill stress test negative for angina pectoris 2011    Past Surgical History:  Procedure Laterality Date   25 GAUGE PARS PLANA VITRECTOMY WITH 20 GAUGE MVR PORT FOR MACULAR HOLE Right 07/19/2020   Procedure: 25 GAUGE PParchment  Surgeon: ZBernarda Caffey MD;  [facility]  Service: Ophthalmology;  Laterality: Right;   CARDIAC CATHETERIZATION  06/2011   "diagnostic"   COLONOSCOPY     CORONARY ANGIOPLASTY WITH STENT PLACEMENT  07/09/11   "1"   GAS/FLUID EXCHANGE Right 07/19/2020   Procedure: GAS/FLUID EXCHANGE;  Surgeon: ZBernarda Caffey MD;  [facility]  Service: Ophthalmology;  Laterality: Right;  c3 f8   HEMORRHOID SURGERY  2000   INGUINAL HERNIA REPAIR  1987   left   LEFT HEART CATHETERIZATION WITH CORONARY ANGIOGRAM N/A 10/21/2011   Procedure: LEFT HEART CATHETERIZATION WITH CORONARY ANGIOGRAM;  Surgeon: THillary Bow MD;  [facility];  Service: Cardiovascular;  Laterality: N/A;   LITHOTRIPSY  10/2008   MEMBRANE PEEL Right 07/19/2020   Procedure: MEMBRANE PEEL;  Surgeon: ZBernarda Caffey MD;  [place] [facility]  Service: Ophthalmology;  Laterality: Right;   PERCUTANEOUS CORONARY STENT INTERVENTION (PCI-S) N/A 07/09/2011   Procedure: PERCUTANEOUS CORONARY STENT INTERVENTION (PCI-S);  Surgeon: MSherren Mocha MD;  [facility];  Service: Cardiovascular;  Laterality: N/A;   PHOTOCOAGULATION WITH LASER Right 07/19/2020   Procedure: PHOTOCOAGULATION WITH LASER;   Surgeon: ZBernarda Caffey MD;  [place] [facility]  Service: Ophthalmology;  Laterality: Right;   UPPER GI ENDOSCOPY     URETEROSCOPY  10/2009    Family Psychiatric History: as below  Family History:  Family History  Problem Relation Age of Onset   Heart attack Father 462      LVAD   Suicidality Maternal Uncle    Hypertension Other     Social History:   Social History   Socioeconomic History   Marital status: Married    Spouse name: Not on file   Number of children: 4   Years of education: Not on file   Highest education level: Some college, no degree  Occupational History   Occupation:  Health insurance broker  Tobacco Use   Smoking status: Former    Packs/day: 0.50    Years: 20.00    Total pack years: 10.00    Types: Cigarettes    Quit date: 02/17/1990    Years since quitting: 31.8   Smokeless tobacco: Never  Vaping Use   Vaping Use: Never used  Substance and Sexual Activity   Alcohol use: Not Currently    Alcohol/week: 4.0 standard drinks of alcohol    Types: 2 Glasses of wine, 2 Cans of beer per week   Drug use: Never    Frequency: 7.0 times per week    Comment: 07/09/11 "lets say a cigarette a day" Last used   Sexual activity: Yes    Birth control/protection: None  Other Topics Concern   Not on file  Social History Narrative   Married and has 4 children.     Social Determinants of Health   Financial Resource Strain: Not on file  Food Insecurity: Not on file  Transportation Needs: Not on file  Physical Activity: Not on file  Stress: Not on file  Social Connections: Not on file    Additional Social History: as above  Allergies:   Allergies  Allergen Reactions   Seasonal Ic [Octacosanol]     Metabolic Disorder Labs: Lab Results  Component Value Date   HGBA1C 6.5 11/07/2021   MPG 114 10/21/2011   No results found for: "PROLACTIN" Lab Results  Component Value Date   CHOL 113 11/07/2021   TRIG 111.0 11/07/2021   HDL 29.00 (L) 11/07/2021   CHOLHDL  4 11/07/2021   VLDL 22.2 11/07/2021   LDLCALC 61 11/07/2021   LDLCALC 68 08/05/2021   Lab Results  Component Value Date   TSH 2.00 05/02/2021    Therapeutic Level Labs: No results found for: "LITHIUM" No results found for: "CBMZ" No results found for: "VALPROATE"  Current Medications: Current Outpatient Medications  Medication Sig Dispense Refill   amLODipine (NORVASC) 5 MG tablet Take 1 tablet (5 mg total) by mouth daily. 30 tablet 3   aspirin EC 81 MG tablet Take 1 tablet (81 mg total) by mouth daily. Swallow whole. 90 tablet 3   atorvastatin (LIPITOR) 20 MG tablet Take 1 tablet (20 mg total) by mouth daily. 90 tablet 3   BD HYPODERMIC NEEDLE 16G X 1" MISC FOR USE WITH TESTOSTERONE INJECTIONS 25 each 3   BD HYPODERMIC NEEDLE 18G X 1" MISC USE WITH TESTOSTERONE INJECTIONS     CINNAMON PO Take 1,000 mg by mouth 2 (two) times daily.      Cyanocobalamin (VITAMIN B-12) 5000 MCG SUBL Place 5,000 mcg under the tongue daily.     fluticasone (FLONASE) 50 MCG/ACT nasal spray USE 2 SPRAYS IN EACH NOSTRIL DAILY AS DIRECTED (Patient taking differently: Place 2 sprays into both nostrils at bedtime.) 48 g 1   glucose blood test strip Use as instructed to check sugars once daily. Dx E11.9 100 each 12   Lancets (ONETOUCH ULTRASOFT) lancets Use as instructed to check sugars once daily. Dx E 11.9 100 each 12   magnesium oxide (MAG-OX) 400 MG tablet Take 400 mg by mouth daily.     olmesartan-hydrochlorothiazide (BENICAR HCT) 40-25 MG tablet Take 1 tablet by mouth at bedtime. 90 tablet 3   pantoprazole (PROTONIX) 40 MG tablet Take 1 tablet (40 mg total) by mouth daily. 90 tablet 0   polyethylene glycol powder (GLYCOLAX/MIRALAX) powder TAKE 17 GRAMS BY MOUTH EVERY DAY as needed (as directed) (  Patient taking differently: Take 17 g by mouth daily.) 527 g 1   potassium citrate (UROCIT-K) 10 MEQ (1080 MG) SR tablet Take 1 tablet (10 mEq total) by mouth daily. 90 tablet 1   prednisoLONE acetate (PRED FORTE)  1 % ophthalmic suspension Place 1 drop into the right eye 4 (four) times daily. 15 mL 0   Syringe, Disposable, (B-D SYRINGE LUER-LOK 3CC) 3 ML MISC 1 mL by Does not apply route every 14 (fourteen) days. 25 each 0   SYRINGE-NEEDLE, DISP, 3 ML (B-D 3CC LUER-LOK SYR 22GX1") 22G X 1" 3 ML MISC FOR TESTOSTERONE INJECTIONS 25 each 0   tamsulosin (FLOMAX) 0.4 MG CAPS capsule TAKE ONE CAPSULE BY MOUTH ONE TIME DAILY 90 capsule 1   testosterone cypionate (DEPOTESTOSTERONE CYPIONATE) 200 MG/ML injection Inject 0.7 mLs (140 mg total) into the muscle every 7 (seven) days. (Patient taking differently: Inject 140 mg into the muscle every 7 (seven) days. Every Monday) 10 mL 0   VITAMIN D, CHOLECALCIFEROL, PO Take 2,000 mg by mouth daily.     VITAMIN E PO Take 2,000 Units by mouth daily.     zinc gluconate 50 MG tablet Take 50 mg by mouth daily.     bacitracin-polymyxin b (POLYSPORIN) ophthalmic ointment Place into the right eye at bedtime. Place a 1/2 inch ribbon of ointment into the lower eyelid. (Patient not taking: Reported on 12/19/2021) 3.5 g 3   hydrOXYzine (VISTARIL) 50 MG capsule Take 1 capsule (50 mg total) by mouth daily as needed for anxiety. 90 capsule 0   lamoTRIgine (LAMICTAL) 150 MG tablet Take 1 tablet (150 mg total) by mouth at bedtime. 90 tablet 0   lamoTRIgine (LAMICTAL) 200 MG tablet Take 1 tablet (200 mg total) by mouth daily. 90 tablet 0   traZODone (DESYREL) 100 MG tablet Take 2 tablets (200 mg total) by mouth at bedtime as needed for sleep. 180 tablet 0   venlafaxine XR (EFFEXOR-XR) 150 MG 24 hr capsule Take 1 capsule (150 mg total) by mouth daily. 90 capsule 0   No current facility-administered medications for this visit.    Musculoskeletal: Strength & Muscle Tone: within normal limits Gait & Station: normal Patient leans: N/A  Psychiatric Specialty Exam: Review of Systems  Psychiatric/Behavioral:  Negative for agitation, behavioral problems, confusion, decreased concentration,  dysphoric mood, hallucinations, self-injury, sleep disturbance and suicidal ideas. The patient is nervous/anxious. The patient is not hyperactive.   All other systems reviewed and are negative.   Blood pressure 135/77, pulse 97, temperature 99 F (37.2 C), temperature source Oral, height 6' (1.829 m), weight 205 lb 12.8 oz (93.4 kg).Body mass index is 27.91 kg/m.  General Appearance: Fairly Groomed  Eye Contact:  Good  Speech:  Clear and Coherent  Volume:  Normal  Mood:   good  Affect:  Appropriate, Congruent, and Full Range  Thought Process:  Coherent  Orientation:  Full (Time, Place, and Person)  Thought Content:  Logical  Suicidal Thoughts:  No  Homicidal Thoughts:  No  Memory:  Immediate;   Good  Judgement:  Good  Insight:  Good  Psychomotor Activity:  Normal  Concentration:  Concentration: Good and Attention Span: Good  Recall:  Good  Fund of Knowledge:Good  Language: Good  Akathisia:  No  Handed:  Right  AIMS (if indicated):  not done  Assets:  Communication Skills Desire for Improvement  ADL's:  Intact  Cognition: WNL  Sleep:  Good   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 is a 66 y.o. year old male with a history of Bipolar II disorder, CAD, diabetes, GERD, hyperlipidemia, who is referred for bipolar II disorder.   1. Bipolar II disorder ([place]) 2. GAD (generalized anxiety  disorder) He denies any significant mood symptoms except self-limiting anxiety in relation to work stress over the past few years under the current medication regimen.  He reports good relationship with his wife, and feels satisfied with his current life.  He was advised to hold hydroxyzine if able.  Will plan to lower the dose of venlafaxine in the future if he does not require any hydroxyzine.  Will continue current dose at this time to target depression and anxiety.  Discussed potential risk of hypertension.  Will continue lamotrigine to target bipolar disorder.  Discussed potential risk of Stevens-Johnson syndrome.  Noted that he denies any hypomanic symptoms except irritability and excitement.  He denies family history of bipolar disorder, although he does have family members with suicide attempt on maternal side.  Will continue to assess this.   # Insomnia He reports good benefit from trazodone.  Will continue current dose to target insomnia.   Plan Continue venlafaxine 150 mg daily Continue lamotrigine 150 mg in a.m., 200 mg at night Decrease hydroxyzine 50 mg daily as needed for anxiety (was on 50 mg scheduled) Continue trazodone 200 mg at night as needed for sleep Next appointment: 1/9 at 4 PM for 30 mins. In person  The patient demonstrates the following risk factors for suicide: Chronic risk factors for suicide include: psychiatric disorder of depression . Acute risk factors for suicide include: N/A. Protective factors for this patient include: positive social support, responsibility to others (children, family), coping skills, and hope for the future. Considering these factors, the overall suicide risk at this point appears to be low. Patient is appropriate for outpatient follow up.     Collaboration of Care: Other reviewed notes in Epic  Patient/Guardian was advised Release of Information must be obtained prior to any record release in order to collaborate their care with an outside  provider. Patient/Guardian was advised if they have not already done so to contact the registration department to sign all necessary forms in order for [place] to release information regarding their care.   Consent: Patient/Guardian gives verbal consent for treatment and assignment of benefits for services provided during this visit. Patient/Guardian expressed understanding and agreed to proceed.   Norman Clay, MD 11/2/20235:39 PM

## 2021-12-19 ENCOUNTER — Ambulatory Visit: Payer: Medicare HMO | Admitting: Psychiatry

## 2021-12-19 ENCOUNTER — Encounter: Payer: Self-pay | Admitting: Psychiatry

## 2021-12-19 VITALS — BP 135/77 | HR 97 | Temp 99.0°F | Ht 72.0 in | Wt 205.8 lb

## 2021-12-19 DIAGNOSIS — G4700 Insomnia, unspecified: Secondary | ICD-10-CM

## 2021-12-19 DIAGNOSIS — R69 Illness, unspecified: Secondary | ICD-10-CM | POA: Diagnosis not present

## 2021-12-19 DIAGNOSIS — F411 Generalized anxiety disorder: Secondary | ICD-10-CM

## 2021-12-19 DIAGNOSIS — F3181 Bipolar II disorder: Secondary | ICD-10-CM | POA: Diagnosis not present

## 2021-12-19 MED ORDER — HYDROXYZINE PAMOATE 50 MG PO CAPS: 50.0000 mg | ORAL_CAPSULE | Freq: Every day | ORAL | 0 refills | Status: AC | PRN

## 2021-12-19 MED ORDER — LAMOTRIGINE 150 MG PO TABS: 150.0000 mg | ORAL_TABLET | Freq: Every day | ORAL | 0 refills | Status: AC

## 2021-12-19 MED ORDER — VENLAFAXINE HCL ER 150 MG PO CP24: 150.0000 mg | ORAL_CAPSULE | Freq: Every day | ORAL | 0 refills | Status: AC

## 2021-12-19 MED ORDER — TRAZODONE HCL 100 MG PO TABS: 200.0000 mg | ORAL_TABLET | Freq: Every evening | ORAL | 0 refills | Status: AC | PRN

## 2021-12-19 MED ORDER — LAMOTRIGINE 200 MG PO TABS: 200.0000 mg | ORAL_TABLET | Freq: Every day | ORAL | 0 refills | Status: DC

## 2021-12-19 NOTE — Patient Instructions (Signed)
Continue venlafaxine 150 mg daily Continue lamotrigine 150 mg in a.m., 200 mg at night Decrease hydroxyzine 50 mg daily as needed for anxiety Continue trazodone 200 mg at night as needed for sleep Next appointment: 1/9 at 4 PM

## 2021-12-24 ENCOUNTER — Ambulatory Visit: Payer: Medicare HMO | Admitting: Internal Medicine

## 2021-12-24 NOTE — Progress Notes (Deleted)
Patient ID: Keith Silbernagel., male   DOB: 01/02/1956, 66 y.o.   MRN: [id number]   Subjective:    Patient ID: Keith Stave., male    DOB: 1955/12/02, 66 y.o.   MRN: [id number]    Patient here for No chief complaint on file.  Marland Kitchen   HPI Here to follow up regarding his blood pressure.  Blood pressure elevated last visit.  Was started back on amlodipine.    Past Medical History:  Diagnosis Date   Acid reflux    Angina 2013   no current problems   Anxiety    Arthritis    Bipolar disorder ([place])    type 2   Depression    Diabetes mellitus without complication ([place])    type 2   Diastasis of muscle 2011   "abdominal"   Dysplastic polyp of colon 2008   [facility]   GERD (gastroesophageal reflux disease)    Gout    Multiple episodes - no current problems   Heart disease    History of kidney stones    surgery to remove stones   Hyperlipidemia    Hypertension    Myocardial infarction ([place]) 05/2011   "we think";  06/2011 abnl mv;  06/2011 Cath - subtotal occlusion of Ramus, otw nonobs dzs;  06/2011 PCI/DES of Ramus w/ 2.25x107m Resolute DES    Sleep apnea    uses CPAP nightly   Syncopal episodes 05/2020   Syncope 05/2020   Treadmill stress test negative for angina pectoris 2011   Past Surgical History:  Procedure Laterality Date   25 GAUGE PARS PLANA VITRECTOMY WITH 20 GAUGE MVR PORT FOR MACULAR HOLE Right 07/19/2020   Procedure: 25 GAUGE [place]  Surgeon: ZBernarda Caffey MD;  [place]  Service: Ophthalmology;  Laterality: Right;   CARDIAC CATHETERIZATION  06/2011   "diagnostic"   COLONOSCOPY     CORONARY ANGIOPLASTY WITH STENT PLACEMENT  07/09/11   "1"   GAS/FLUID EXCHANGE Right 07/19/2020   Procedure: GAS/FLUID EXCHANGE;  Surgeon: ZBernarda Caffey MD;  [place]  Service: Ophthalmology;  Laterality: Right;  c3 f8   HEMORRHOID SURGERY  2000   INGUINAL HERNIA REPAIR  1987   left   LEFT HEART CATHETERIZATION WITH CORONARY ANGIOGRAM N/A 10/21/2011    Procedure: LEFT HEART CATHETERIZATION WITH CORONARY ANGIOGRAM;  Surgeon: THillary Bow MD;  [facility]  Service: Cardiovascular;  Laterality: N/A;   LITHOTRIPSY  10/2008   MEMBRANE PEEL Right 07/19/2020   Procedure: MEMBRANE PEEL;  Surgeon: ZBernarda Caffey MD;  [place]  Service: Ophthalmology;  Laterality: Right;   PERCUTANEOUS CORONARY STENT INTERVENTION (PCI-S) N/A 07/09/2011   Procedure: PERCUTANEOUS CORONARY STENT INTERVENTION (PCI-S);  Surgeon: MSherren Mocha MD;  [facility];  Service: Cardiovascular;  Laterality: N/A;   PHOTOCOAGULATION WITH LASER Right 07/19/2020   Procedure: PHOTOCOAGULATION WITH LASER;  Surgeon: ZBernarda Caffey MD;  [place]  Service: Ophthalmology;  Laterality: Right;   UPPER GI ENDOSCOPY     URETEROSCOPY  10/2009   Family History  Problem Relation Age of Onset   Heart attack Father 43      LVAD   Suicidality Maternal Uncle    Hypertension Other    Social History   Socioeconomic History   Marital status: Married    Spouse name: Not on file   Number of children: 4   Years of education: Not on file   Highest education level:  Some college, no degree  Occupational History   Occupation: Conservation officer, nature  Tobacco Use   Smoking status: Former    Packs/day: 0.50    Years: 20.00    Total pack years: 10.00    Types: Cigarettes    Quit date: 02/17/1990    Years since quitting: 31.8   Smokeless tobacco: Never  Vaping Use   Vaping Use: Never used  Substance and Sexual Activity   Alcohol use: Not Currently    Alcohol/week: 4.0 standard drinks of alcohol    Types: 2 Glasses of wine, 2 Cans of beer per week   Drug use: Never    Frequency: 7.0 times per week    Comment: 07/09/11 "lets say a cigarette a day" Last used   Sexual activity: Yes    Birth control/protection: None  Other Topics Concern   Not on file  Social History Narrative   Married and has 4 children.     Social Determinants of Health   Financial  Resource Strain: Not on file  Food Insecurity: Not on file  Transportation Needs: Not on file  Physical Activity: Not on file  Stress: Not on file  Social Connections: Not on file     Review of Systems     Objective:     There were no vitals taken for this visit. Wt Readings from Last 3 Encounters:  11/12/21 211 lb 6.4 oz (95.9 kg)  08/12/21 209 lb 12.8 oz (95.2 kg)  06/26/21 207 lb 6 oz (94.1 kg)    Physical Exam   Outpatient Encounter Medications as of 12/24/2021  Medication Sig   amLODipine (NORVASC) 5 MG tablet Take 1 tablet (5 mg total) by mouth daily.   aspirin EC 81 MG tablet Take 1 tablet (81 mg total) by mouth daily. Swallow whole.   atorvastatin (LIPITOR) 20 MG tablet Take 1 tablet (20 mg total) by mouth daily.   bacitracin-polymyxin b (POLYSPORIN) ophthalmic ointment Place into the right eye at bedtime. Place a 1/2 inch ribbon of ointment into the lower eyelid. (Patient not taking: Reported on 12/19/2021)   BD HYPODERMIC NEEDLE 16G X 1" MISC FOR USE WITH TESTOSTERONE INJECTIONS   BD HYPODERMIC NEEDLE 18G X 1" MISC USE WITH TESTOSTERONE INJECTIONS   CINNAMON PO Take 1,000 mg by mouth 2 (two) times daily.    Cyanocobalamin (VITAMIN B-12) 5000 MCG SUBL Place 5,000 mcg under the tongue daily.   fluticasone (FLONASE) 50 MCG/ACT nasal spray USE 2 SPRAYS IN EACH NOSTRIL DAILY AS DIRECTED (Patient taking differently: Place 2 sprays into both nostrils at bedtime.)   glucose blood test strip Use as instructed to check sugars once daily. Dx E11.9   hydrOXYzine (VISTARIL) 50 MG capsule Take 1 capsule (50 mg total) by mouth daily as needed for anxiety.   lamoTRIgine (LAMICTAL) 150 MG tablet Take 1 tablet (150 mg total) by mouth at bedtime.   lamoTRIgine (LAMICTAL) 200 MG tablet Take 1 tablet (200 mg total) by mouth daily.   Lancets (ONETOUCH ULTRASOFT) lancets Use as instructed to check sugars once daily. Dx E 11.9   magnesium oxide (MAG-OX) 400 MG tablet Take 400 mg by mouth  daily.   olmesartan-hydrochlorothiazide (BENICAR HCT) 40-25 MG tablet Take 1 tablet by mouth at bedtime.   pantoprazole (PROTONIX) 40 MG tablet Take 1 tablet (40 mg total) by mouth daily.   polyethylene glycol powder (GLYCOLAX/MIRALAX) powder TAKE 17 GRAMS BY MOUTH EVERY DAY as needed (as directed) (Patient taking differently: Take 17 g by mouth  daily.)   potassium citrate (UROCIT-K) 10 MEQ (1080 MG) SR tablet Take 1 tablet (10 mEq total) by mouth daily.   prednisoLONE acetate (PRED FORTE) 1 % ophthalmic suspension Place 1 drop into the right eye 4 (four) times daily.   Syringe, Disposable, (B-D SYRINGE LUER-LOK 3CC) 3 ML MISC 1 mL by Does not apply route every 14 (fourteen) days.   SYRINGE-NEEDLE, DISP, 3 ML (B-D 3CC LUER-LOK SYR 22GX1") 22G X 1" 3 ML MISC FOR TESTOSTERONE INJECTIONS   tamsulosin (FLOMAX) 0.4 MG CAPS capsule TAKE ONE CAPSULE BY MOUTH ONE TIME DAILY   testosterone cypionate (DEPOTESTOSTERONE CYPIONATE) 200 MG/ML injection Inject 0.7 mLs (140 mg total) into the muscle every 7 (seven) days. (Patient taking differently: Inject 140 mg into the muscle every 7 (seven) days. Every Monday)   traZODone (DESYREL) 100 MG tablet Take 2 tablets (200 mg total) by mouth at bedtime as needed for sleep.   venlafaxine XR (EFFEXOR-XR) 150 MG 24 hr capsule Take 1 capsule (150 mg total) by mouth daily.   VITAMIN D, CHOLECALCIFEROL, PO Take 2,000 mg by mouth daily.   VITAMIN E PO Take 2,000 Units by mouth daily.   zinc gluconate 50 MG tablet Take 50 mg by mouth daily.   No facility-administered encounter medications on file as of 12/24/2021.     Lab Results  Component Value Date   WBC 7.0 11/07/2021   HGB 16.7 11/07/2021   HCT 49.6 11/07/2021   PLT 175.0 11/07/2021   GLUCOSE 126 (H) 11/07/2021   CHOL 113 11/07/2021   TRIG 111.0 11/07/2021   HDL 29.00 (L) 11/07/2021   LDLDIRECT 87.0 06/05/2020   LDLCALC 61 11/07/2021   ALT 32 11/07/2021   AST 29 11/07/2021   NA 138 11/07/2021   K 4.3  11/07/2021   CL 101 11/07/2021   CREATININE 1.26 11/07/2021   BUN 22 11/07/2021   CO2 30 11/07/2021   TSH 2.00 05/02/2021   PSA 0.38 08/05/2021   INR 1.11 10/19/2011   HGBA1C 6.5 11/07/2021   MICROALBUR 2.1 (H) 11/07/2021       Assessment & Plan:   Problem List Items Addressed This Visit   None    Einar Pheasant, MD

## 2021-12-30 ENCOUNTER — Ambulatory Visit (INDEPENDENT_AMBULATORY_CARE_PROVIDER_SITE_OTHER): Payer: Medicare HMO | Admitting: Internal Medicine

## 2021-12-30 ENCOUNTER — Encounter: Payer: Self-pay | Admitting: Internal Medicine

## 2021-12-30 VITALS — BP 138/82 | HR 84 | Temp 98.0°F | Resp 17 | Ht 72.0 in | Wt 210.0 lb

## 2021-12-30 DIAGNOSIS — F3181 Bipolar II disorder: Secondary | ICD-10-CM | POA: Diagnosis not present

## 2021-12-30 DIAGNOSIS — I10 Essential (primary) hypertension: Secondary | ICD-10-CM | POA: Diagnosis not present

## 2021-12-30 DIAGNOSIS — E782 Mixed hyperlipidemia: Secondary | ICD-10-CM | POA: Diagnosis not present

## 2021-12-30 DIAGNOSIS — Z86010 Personal history of colonic polyps: Secondary | ICD-10-CM | POA: Diagnosis not present

## 2021-12-30 DIAGNOSIS — R945 Abnormal results of liver function studies: Secondary | ICD-10-CM | POA: Diagnosis not present

## 2021-12-30 DIAGNOSIS — E1159 Type 2 diabetes mellitus with other circulatory complications: Secondary | ICD-10-CM | POA: Diagnosis not present

## 2021-12-30 DIAGNOSIS — F419 Anxiety disorder, unspecified: Secondary | ICD-10-CM

## 2021-12-30 DIAGNOSIS — K2100 Gastro-esophageal reflux disease with esophagitis, without bleeding: Secondary | ICD-10-CM

## 2021-12-30 DIAGNOSIS — I25118 Atherosclerotic heart disease of native coronary artery with other forms of angina pectoris: Secondary | ICD-10-CM

## 2021-12-30 DIAGNOSIS — R0989 Other specified symptoms and signs involving the circulatory and respiratory systems: Secondary | ICD-10-CM | POA: Diagnosis not present

## 2021-12-30 DIAGNOSIS — R69 Illness, unspecified: Secondary | ICD-10-CM | POA: Diagnosis not present

## 2021-12-30 MED ORDER — AMLODIPINE BESYLATE 10 MG PO TABS: 10.0000 mg | ORAL_TABLET | Freq: Every day | ORAL | 1 refills | Status: DC

## 2021-12-30 NOTE — Progress Notes (Addendum)
Patient ID: Keith Maull., male   DOB: 24-Oct-1955, 66 y.o.   MRN: [id number]   Subjective:    Patient ID: Keith Clerk., male    DOB: [birth year], 66 y.o.   MRN: [id number]    Patient here for  Chief Complaint  Patient presents with   Hypertension   .   HPI Here to follow up regarding blood pressure.  Has been on benicar/hctz daily.  Restarted amlodipine last visit. Blood pressure still remains elevated above goal - reported average 144/86.  No chest pain or sob reported.  No cough or congestion reported.  No abdominal pain or bowel change reported.  Seeing psychiatry.  Currently doing well on current regimen.    Past Medical History:  Diagnosis Date   Acid reflux    Angina 2013   no current problems   Anxiety    Arthritis    Bipolar disorder ([place])    type 2   Depression    Diabetes mellitus without complication ([place])    type 2   Diastasis of muscle 2011   "abdominal"   Dysplastic polyp of colon 2008   [facility]   GERD (gastroesophageal reflux disease)    Gout    Multiple episodes - no current problems   Heart disease    History of kidney stones    surgery to remove stones   Hyperlipidemia    Hypertension    Myocardial infarction ([place]) 05/2011   "we think";  06/2011 abnl mv;  06/2011 Cath - subtotal occlusion of Ramus, otw nonobs dzs;  06/2011 PCI/DES of Ramus w/ 2.25x27m Resolute DES    Sleep apnea    uses CPAP nightly   Syncopal episodes 05/2020   Syncope 05/2020   Treadmill stress test negative for angina pectoris 2011   Past Surgical History:  Procedure Laterality Date   25 GAUGE PARS PLANA VITRECTOMY WITH 20 GAUGE MVR PORT FOR MACULAR HOLE Right 07/19/2020   Pro[place]  Surgeon: ZBernarda Caffey MD;  [place]  Service: Ophthalmology;  Laterality: Right;   CARDIAC CATHETERIZATION  06/2011   "diagnostic"   COLONOSCOPY     CORONARY ANGIOPLASTY WITH STENT PLACEMENT  07/09/11   "1"   GAS/FLUID EXCHANGE Right  07/19/2020   Procedure: GAS/FLUID EXCHANGE;  Surgeon: ZBernarda Caffey MD;  [place]  Service: Ophthalmology;  Laterality: Right;  c3 f8   HEMORRHOID SURGERY  2000   INGUINAL HERNIA REPAIR  1987   left   LEFT HEART CATHETERIZATION WITH CORONARY ANGIOGRAM N/A 10/21/2011   Procedure: LEFT HEART CATHETERIZATION WITH CORONARY ANGIOGRAM;  Surgeon: THillary Bow MD;  [facility];  Service: Cardiovascular;  Laterality: N/A;   LITHOTRIPSY  10/2008   MEMBRANE PEEL Right 07/19/2020   Procedure: MEMBRANE PEEL;  Surgeon: ZBernarda Caffey MD;  [place]  Service: Ophthalmology;  Laterality: Right;   PERCUTANEOUS CORONARY STENT INTERVENTION (PCI-S) N/A 07/09/2011   Procedure: PERCUTANEOUS CORONARY STENT INTERVENTION (PCI-S);  Surgeon: MSherren Mocha MD;  [facility]  Service: Cardiovascular;  Laterality: N/A;   PHOTOCOAGULATION WITH LASER Right 07/19/2020   Procedure: PHOTOCOAGULATION WITH LASER;  Surgeon: ZBernarda Caffey MD;  [place]  Service: Ophthalmology;  Laterality: Right;   UPPER GI ENDOSCOPY     URETEROSCOPY  10/2009   Family History  Problem Relation Age of Onset   Heart attack Father 472      LVAD   Suicidality Maternal Uncle  Hypertension Other    Social History   Socioeconomic History   Marital status: Married    Spouse name: Not on file   Number of children: 4   Years of education: Not on file   Highest education level: Some college, no degree  Occupational History   Occupation: Conservation officer, nature  Tobacco Use   Smoking status: Former    Packs/day: 0.50    Years: 20.00    Total pack years: 10.00    Types: Cigarettes    Quit date: 02/17/1990    Years since quitting: 31.9   Smokeless tobacco: Never  Vaping Use   Vaping Use: Never used  Substance and Sexual Activity   Alcohol use: Not Currently    Alcohol/week: 4.0 standard drinks of alcohol    Types: 2 Glasses of wine, 2 Cans of beer per week   Drug use: Never    Frequency: 7.0  times per week    Comment: 07/09/11 "lets say a cigarette a day" Last used   Sexual activity: Yes    Birth control/protection: None  Other Topics Concern   Not on file  Social History Narrative   Married and has 4 children.     Social Determinants of Health   Financial Resource Strain: Not on file  Food Insecurity: Not on file  Transportation Needs: Not on file  Physical Activity: Not on file  Stress: Not on file  Social Connections: Not on file     Review of Systems  Constitutional:  Negative for appetite change and unexpected weight change.  HENT:  Negative for congestion and sinus pressure.   Respiratory:  Negative for cough, chest tightness and shortness of breath.   Cardiovascular:  Negative for chest pain, palpitations and leg swelling.  Gastrointestinal:  Negative for abdominal pain, diarrhea, nausea and vomiting.  Genitourinary:  Negative for difficulty urinating and dysuria.  Musculoskeletal:  Negative for joint swelling and myalgias.  Skin:  Negative for color change and rash.  Neurological:  Negative for dizziness and headaches.  Psychiatric/Behavioral:  Negative for agitation and dysphoric mood.        Objective:     BP 138/82 (BP Location: Left Arm, Patient Position: Sitting, Cuff Size: Large)   Pulse 84   Temp 98 F (36.7 C) (Temporal)   Resp 17   Ht 6' (1.829 m)   Wt 210 lb (95.3 kg)   SpO2 96%   BMI 28.48 kg/m  Wt Readings from Last 3 Encounters:  12/30/21 210 lb (95.3 kg)  11/12/21 211 lb 6.4 oz (95.9 kg)  08/12/21 209 lb 12.8 oz (95.2 kg)    Physical Exam Vitals reviewed.  Constitutional:      General: He is not in acute distress.    Appearance: Normal appearance. He is well-developed.  HENT:     Head: Normocephalic and atraumatic.     Right Ear: External ear normal.     Left Ear: External ear normal.     Mouth/Throat:     Pharynx: Oropharynx is clear. No oropharyngeal exudate or posterior oropharyngeal erythema.  Eyes:     General: No  scleral icterus.       Right eye: No discharge.        Left eye: No discharge.     Conjunctiva/sclera: Conjunctivae normal.  Neck:     Comments: Left carotid bruit.  Cardiovascular:     Rate and Rhythm: Normal rate and regular rhythm.  Pulmonary:     Effort: Pulmonary effort is normal.  No respiratory distress.     Breath sounds: Normal breath sounds.  Abdominal:     General: Bowel sounds are normal.     Palpations: Abdomen is soft.     Tenderness: There is no abdominal tenderness.  Musculoskeletal:        General: No swelling or tenderness.     Cervical back: Neck supple. No tenderness.  Lymphadenopathy:     Cervical: No cervical adenopathy.  Skin:    Findings: No erythema or rash.  Neurological:     Mental Status: He is alert.  Psychiatric:        Mood and Affect: Mood normal.        Behavior: Behavior normal.      Outpatient Encounter Medications as of 12/30/2021  Medication Sig   amLODipine (NORVASC) 10 MG tablet Take 1 tablet (10 mg total) by mouth daily.   aspirin EC 81 MG tablet Take 1 tablet (81 mg total) by mouth daily. Swallow whole.   atorvastatin (LIPITOR) 20 MG tablet Take 1 tablet (20 mg total) by mouth daily.   bacitracin-polymyxin b (POLYSPORIN) ophthalmic ointment Place into the right eye at bedtime. Place a 1/2 inch ribbon of ointment into the lower eyelid.   BD HYPODERMIC NEEDLE 16G X 1" MISC FOR USE WITH TESTOSTERONE INJECTIONS   BD HYPODERMIC NEEDLE 18G X 1" MISC USE WITH TESTOSTERONE INJECTIONS   CINNAMON PO Take 1,000 mg by mouth 2 (two) times daily.    Cyanocobalamin (VITAMIN B-12) 5000 MCG SUBL Place 5,000 mcg under the tongue daily.   fluticasone (FLONASE) 50 MCG/ACT nasal spray USE 2 SPRAYS IN EACH NOSTRIL DAILY AS DIRECTED (Patient taking differently: Place 2 sprays into both nostrils at bedtime.)   glucose blood test strip Use as instructed to check sugars once daily. Dx E11.9   hydrOXYzine (VISTARIL) 50 MG capsule Take 1 capsule (50 mg total) by  mouth daily as needed for anxiety.   lamoTRIgine (LAMICTAL) 150 MG tablet Take 1 tablet (150 mg total) by mouth at bedtime.   lamoTRIgine (LAMICTAL) 200 MG tablet Take 1 tablet (200 mg total) by mouth daily.   Lancets (ONETOUCH ULTRASOFT) lancets Use as instructed to check sugars once daily. Dx E 11.9   magnesium oxide (MAG-OX) 400 MG tablet Take 400 mg by mouth daily.   olmesartan-hydrochlorothiazide (BENICAR HCT) 40-25 MG tablet Take 1 tablet by mouth at bedtime.   pantoprazole (PROTONIX) 40 MG tablet Take 1 tablet (40 mg total) by mouth daily.   polyethylene glycol powder (GLYCOLAX/MIRALAX) powder TAKE 17 GRAMS BY MOUTH EVERY DAY as needed (as directed) (Patient taking differently: Take 17 g by mouth daily.)   potassium citrate (UROCIT-K) 10 MEQ (1080 MG) SR tablet Take 1 tablet (10 mEq total) by mouth daily.   prednisoLONE acetate (PRED FORTE) 1 % ophthalmic suspension Place 1 drop into the right eye 4 (four) times daily.   Syringe, Disposable, (B-D SYRINGE LUER-LOK 3CC) 3 ML MISC 1 mL by Does not apply route every 14 (fourteen) days.   SYRINGE-NEEDLE, DISP, 3 ML (B-D 3CC LUER-LOK SYR 22GX1") 22G X 1" 3 ML MISC FOR TESTOSTERONE INJECTIONS   tamsulosin (FLOMAX) 0.4 MG CAPS capsule TAKE ONE CAPSULE BY MOUTH ONE TIME DAILY   testosterone cypionate (DEPOTESTOSTERONE CYPIONATE) 200 MG/ML injection Inject 0.7 mLs (140 mg total) into the muscle every 7 (seven) days. (Patient taking differently: Inject 140 mg into the muscle every 7 (seven) days. Every Monday)   traZODone (DESYREL) 100 MG tablet Take 2 tablets (200 mg  total) by mouth at bedtime as needed for sleep.   venlafaxine XR (EFFEXOR-XR) 150 MG 24 hr capsule Take 1 capsule (150 mg total) by mouth daily.   VITAMIN D, CHOLECALCIFEROL, PO Take 2,000 mg by mouth daily.   VITAMIN E PO Take 2,000 Units by mouth daily.   zinc gluconate 50 MG tablet Take 50 mg by mouth daily.   [DISCONTINUED] amLODipine (NORVASC) 5 MG tablet Take 1 tablet (5 mg total)  by mouth daily.   No facility-administered encounter medications on file as of 12/30/2021.     Lab Results  Component Value Date   WBC 7.0 11/07/2021   HGB 16.7 11/07/2021   HCT 49.6 11/07/2021   PLT 175.0 11/07/2021   GLUCOSE 126 (H) 11/07/2021   CHOL 113 11/07/2021   TRIG 111.0 11/07/2021   HDL 29.00 (L) 11/07/2021   LDLDIRECT 87.0 06/05/2020   LDLCALC 61 11/07/2021   ALT 32 11/07/2021   AST 29 11/07/2021   NA 138 11/07/2021   K 4.3 11/07/2021   CL 101 11/07/2021   CREATININE 1.26 11/07/2021   BUN 22 11/07/2021   CO2 30 11/07/2021   TSH 2.00 05/02/2021   PSA 0.38 08/05/2021   INR 1.11 10/19/2011   HGBA1C 6.5 11/07/2021   MICROALBUR 2.1 (H) 11/07/2021       Assessment & Plan:   Problem List Items Addressed This Visit     Abnormal liver function    Diet, exercise and weight loss.  Follow liver function tests.        Anxiety    Seeing psychiatry.  Appears to be doing well on current regimen.  Continue f/u with psychiatry.       Bipolar 2 disorder ([place])    Appears to be doing well on current regimen. Continue f/u with psychiatry.       CAD (coronary artery disease)    Followed by Dr Rockey Situ.  No chest pain or sob reported.  Continue risk factor modification.       Relevant Medications   amLODipine (NORVASC) 10 MG tablet   Diabetes mellitus with cardiac complication (HCC)    Low carb diet and exercise. Follow met b and a1c.   Lab Results  Component Value Date   HGBA1C 6.5 11/07/2021         Relevant Medications   amLODipine (NORVASC) 10 MG tablet   GERD (gastroesophageal reflux disease)    No upper symptoms reported.  Continue protonix.       History of colonic polyps    Colonoscopy 08/23/13.  One 67m polyp in the rectum.  A tattoo was seen in the transverse colon.  Post-polypectomy scar found at tatoo site.  No residual polyp tissue.  Internal hemorrhoids. Overdue f/u colonoscopy.  Scheduled with Dr TAlain Marion- in 01/2022.       Hyperlipidemia     Continue lipitor.  Low cholesterol diet and exercise.  Follow lipid panel and liver function tests.  Triglycerides improved from last check.   Lab Results  Component Value Date   CHOL 113 11/07/2021   HDL 29.00 (L) 11/07/2021   LDLCALC 61 11/07/2021   LDLDIRECT 87.0 06/05/2020   TRIG 111.0 11/07/2021   CHOLHDL 4 11/07/2021       Relevant Medications   amLODipine (NORVASC) 10 MG tablet   Hypertension - Primary    Continue Benicar/hctz.  Blood pressure elevated above goal - averaging 140s/80s. Back on amlodipine 525mq day. Will increase to 1023m day. Follow pressures.  Follow  metabolic panel.       Relevant Medications   amLODipine (NORVASC) 10 MG tablet   Left carotid bruit    Schedule carotid ultrasound to further evaluate.       Relevant Orders   VAS US CAROTID     Einar Pheasant, MD

## 2022-01-04 ENCOUNTER — Other Ambulatory Visit: Payer: Self-pay | Admitting: Internal Medicine

## 2022-01-06 ENCOUNTER — Encounter: Payer: Self-pay | Admitting: Internal Medicine

## 2022-01-06 ENCOUNTER — Other Ambulatory Visit: Payer: Self-pay

## 2022-01-06 DIAGNOSIS — R454 Irritability and anger: Secondary | ICD-10-CM

## 2022-01-06 DIAGNOSIS — F419 Anxiety disorder, unspecified: Secondary | ICD-10-CM

## 2022-01-06 DIAGNOSIS — F3181 Bipolar II disorder: Secondary | ICD-10-CM

## 2022-01-12 ENCOUNTER — Encounter: Payer: Self-pay | Admitting: Internal Medicine

## 2022-01-12 DIAGNOSIS — R0989 Other specified symptoms and signs involving the circulatory and respiratory systems: Secondary | ICD-10-CM | POA: Insufficient documentation

## 2022-01-12 NOTE — Assessment & Plan Note (Signed)
Schedule carotid ultrasound to further evaluate.

## 2022-01-12 NOTE — Addendum Note (Signed)
Addended by: Alisa Graff on: 01/12/2022 09:13 AM   Modules accepted: Orders

## 2022-01-12 NOTE — Assessment & Plan Note (Signed)
Continue Benicar/hctz.  Blood pressure elevated above goal - averaging 140s/80s. Back on amlodipine '5mg'$  q day. Will increase to '10mg'$  q day. Follow pressures.  Follow metabolic panel.

## 2022-01-12 NOTE — Assessment & Plan Note (Signed)
Continue lipitor.  Low cholesterol diet and exercise.  Follow lipid panel and liver function tests.  Triglycerides improved from last check.   Lab Results  Component Value Date   CHOL 113 11/07/2021   HDL 29.00 (L) 11/07/2021   LDLCALC 61 11/07/2021   LDLDIRECT 87.0 06/05/2020   TRIG 111.0 11/07/2021   CHOLHDL 4 11/07/2021

## 2022-01-12 NOTE — Assessment & Plan Note (Signed)
Diet, exercise and weight loss. Follow liver function tests.   

## 2022-01-12 NOTE — Assessment & Plan Note (Signed)
Colonoscopy 08/23/13.  One 19m polyp in the rectum.  A tattoo was seen in the transverse colon.  Post-polypectomy scar found at tatoo site.  No residual polyp tissue.  Internal hemorrhoids. Overdue f/u colonoscopy.  Scheduled with Dr TAlain Marion- in 01/2022.

## 2022-01-12 NOTE — Assessment & Plan Note (Signed)
No upper symptoms reported.  Continue protonix.  

## 2022-01-12 NOTE — Assessment & Plan Note (Signed)
Followed by Dr Rockey Situ.  No chest pain or sob reported.  Continue risk factor modification.

## 2022-01-12 NOTE — Assessment & Plan Note (Signed)
Seeing psychiatry.  Appears to be doing well on current regimen.  Continue f/u with psychiatry.

## 2022-01-12 NOTE — Assessment & Plan Note (Signed)
Appears to be doing well on current regimen. Continue f/u with psychiatry.

## 2022-01-12 NOTE — Assessment & Plan Note (Addendum)
Low carb diet and exercise. Follow met b and a1c.   Lab Results  Component Value Date   HGBA1C 6.5 11/07/2021

## 2022-01-27 DIAGNOSIS — F411 Generalized anxiety disorder: Secondary | ICD-10-CM | POA: Diagnosis not present

## 2022-01-27 DIAGNOSIS — F3181 Bipolar II disorder: Secondary | ICD-10-CM | POA: Diagnosis not present

## 2022-01-27 DIAGNOSIS — R69 Illness, unspecified: Secondary | ICD-10-CM | POA: Diagnosis not present

## 2022-01-28 DIAGNOSIS — G4730 Sleep apnea, unspecified: Secondary | ICD-10-CM | POA: Diagnosis not present

## 2022-01-28 DIAGNOSIS — Z9989 Dependence on other enabling machines and devices: Secondary | ICD-10-CM | POA: Diagnosis not present

## 2022-01-28 DIAGNOSIS — E785 Hyperlipidemia, unspecified: Secondary | ICD-10-CM | POA: Diagnosis not present

## 2022-01-28 DIAGNOSIS — Z1211 Encounter for screening for malignant neoplasm of colon: Secondary | ICD-10-CM | POA: Diagnosis not present

## 2022-01-28 DIAGNOSIS — I252 Old myocardial infarction: Secondary | ICD-10-CM | POA: Diagnosis not present

## 2022-01-28 DIAGNOSIS — Z86010 Personal history of colonic polyps: Secondary | ICD-10-CM | POA: Diagnosis not present

## 2022-01-28 DIAGNOSIS — I129 Hypertensive chronic kidney disease with stage 1 through stage 4 chronic kidney disease, or unspecified chronic kidney disease: Secondary | ICD-10-CM | POA: Diagnosis not present

## 2022-01-28 DIAGNOSIS — D124 Benign neoplasm of descending colon: Secondary | ICD-10-CM | POA: Diagnosis not present

## 2022-01-28 DIAGNOSIS — K6389 Other specified diseases of intestine: Secondary | ICD-10-CM | POA: Diagnosis not present

## 2022-01-28 DIAGNOSIS — N189 Chronic kidney disease, unspecified: Secondary | ICD-10-CM | POA: Diagnosis not present

## 2022-01-28 DIAGNOSIS — K635 Polyp of colon: Secondary | ICD-10-CM | POA: Diagnosis not present

## 2022-01-28 DIAGNOSIS — I2510 Atherosclerotic heart disease of native coronary artery without angina pectoris: Secondary | ICD-10-CM | POA: Diagnosis not present

## 2022-02-07 ENCOUNTER — Other Ambulatory Visit: Payer: Self-pay | Admitting: Internal Medicine

## 2022-02-19 DIAGNOSIS — E291 Testicular hypofunction: Secondary | ICD-10-CM | POA: Diagnosis not present

## 2022-02-19 DIAGNOSIS — R948 Abnormal results of function studies of other organs and systems: Secondary | ICD-10-CM | POA: Diagnosis not present

## 2022-02-20 ENCOUNTER — Ambulatory Visit (INDEPENDENT_AMBULATORY_CARE_PROVIDER_SITE_OTHER): Payer: PPO | Admitting: Internal Medicine

## 2022-02-20 ENCOUNTER — Encounter: Payer: Self-pay | Admitting: Internal Medicine

## 2022-02-20 VITALS — BP 138/70 | HR 77 | Temp 98.6°F | Resp 16 | Ht 72.0 in | Wt 210.0 lb

## 2022-02-20 DIAGNOSIS — I10 Essential (primary) hypertension: Secondary | ICD-10-CM

## 2022-02-20 DIAGNOSIS — G4730 Sleep apnea, unspecified: Secondary | ICD-10-CM

## 2022-02-20 DIAGNOSIS — Z860100 Personal history of colon polyps, unspecified: Secondary | ICD-10-CM

## 2022-02-20 DIAGNOSIS — E1159 Type 2 diabetes mellitus with other circulatory complications: Secondary | ICD-10-CM

## 2022-02-20 DIAGNOSIS — R0989 Other specified symptoms and signs involving the circulatory and respiratory systems: Secondary | ICD-10-CM | POA: Diagnosis not present

## 2022-02-20 DIAGNOSIS — K2100 Gastro-esophageal reflux disease with esophagitis, without bleeding: Secondary | ICD-10-CM

## 2022-02-20 DIAGNOSIS — D696 Thrombocytopenia, unspecified: Secondary | ICD-10-CM

## 2022-02-20 DIAGNOSIS — F3181 Bipolar II disorder: Secondary | ICD-10-CM

## 2022-02-20 DIAGNOSIS — R945 Abnormal results of liver function studies: Secondary | ICD-10-CM | POA: Diagnosis not present

## 2022-02-20 DIAGNOSIS — F419 Anxiety disorder, unspecified: Secondary | ICD-10-CM

## 2022-02-20 DIAGNOSIS — E782 Mixed hyperlipidemia: Secondary | ICD-10-CM

## 2022-02-20 DIAGNOSIS — I25118 Atherosclerotic heart disease of native coronary artery with other forms of angina pectoris: Secondary | ICD-10-CM | POA: Diagnosis not present

## 2022-02-20 MED ORDER — SPIRONOLACTONE 25 MG PO TABS: 12.5000 mg | ORAL_TABLET | Freq: Every day | ORAL | 0 refills | Status: DC

## 2022-02-20 MED ORDER — AMLODIPINE BESYLATE 10 MG PO TABS: 10.0000 mg | ORAL_TABLET | Freq: Every day | ORAL | 3 refills | Status: DC

## 2022-02-20 MED ORDER — AMLODIPINE BESYLATE 10 MG PO TABS: 10.0000 mg | ORAL_TABLET | Freq: Every day | ORAL | 1 refills | Status: DC

## 2022-02-20 NOTE — Progress Notes (Signed)
Subjective:    Patient ID: Keith Gross., male    DOB: [birth year], 67 y.o.   MRN: [id number]  Patient here for  Chief Complaint  Patient presents with   Medical Management of Chronic Issues   Hypertension    HPI Here to follow up regarding his blood pressure.  Amlodipine was increased to '10mg'$  q day last visit.  Doing well on the medication.  Blood pressure remains elevated above goal - averaging 139/76.  (Some varying - 143-155/70-80s).  Seeing a different psychiatrist.  Lamictal was decreased - now taking '150mg'$  bid.  Discussed effexor and elevated blood pressure.  He is discussing with psychiatry.  No chest pain or sob.  No acid reflux reported.  No abdominal pain.  Bowels moving.  Handling stress.    Past Medical History:  Diagnosis Date   Acid reflux    Angina 2013   no current problems   Anxiety    Arthritis    Bipolar disorder ([place])    type 2   Depression    Diabetes mellitus without complication ([place])    type 2   Diastasis of muscle 2011   "abdominal"   Dysplastic polyp of colon 2008   [facility]   GERD (gastroesophageal reflux disease)    Gout    Multiple episodes - no current problems   Heart disease    History of kidney stones    surgery to remove stones   Hyperlipidemia    Hypertension    Myocardial infarction ([place]) 05/2011   "we think";  06/2011 abnl mv;  06/2011 Cath - subtotal occlusion of Ramus, otw nonobs dzs;  06/2011 PCI/DES of Ramus w/ 2.25x37m Resolute DES    Sleep apnea    uses CPAP nightly   Syncopal episodes 05/2020   Syncope 05/2020   Treadmill stress test negative for angina pectoris 2011   Past Surgical History:  Procedure Laterality Date   25 GAUGE PARS PLANA VITRECTOMY WITH 20 GAUGE MVR PORT FOR MACULAR HOLE Right 07/19/2020   Procedure: 25 GAUGE [place]ens  Surgeon: ZBernarda Caffey MD;  [facility]  Service: Ophthalmology;  Laterality: Right;   CARDIAC CATHETERIZATION  06/2011   "diagnostic"   COLONOSCOPY      CORONARY ANGIOPLASTY WITH STENT PLACEMENT  07/09/11   "1"   GAS/FLUID EXCHANGE Right 07/19/2020   Procedure: GAS/FLUID EXCHANGE;  Surgeon: ZBernarda Caffey MD;  [facility] [facility]  Service: Ophthalmology;  Laterality: Right;  c3 f8   HEMORRHOID SURGERY  2000   INGUINAL HERNIA REPAIR  1987   left   LEFT HEART CATHETERIZATION WITH CORONARY ANGIOGRAM N/A 10/21/2011   Procedure: LEFT HEART CATHETERIZATION WITH CORONARY ANGIOGRAM;  Surgeon: THillary Bow MD;  [facility];  Service: Cardiovascular;  Laterality: N/A;   LITHOTRIPSY  10/2008   MEMBRANE PEEL Right 07/19/2020   Procedure: MEMBRANE PEEL;  Surgeon: ZBernarda Caffey MD;  [place] [facility]  Service: Ophthalmology;  Laterality: Right;   PERCUTANEOUS CORONARY STENT INTERVENTION (PCI-S) N/A 07/09/2011   Procedure: PERCUTANEOUS CORONARY STENT INTERVENTION (PCI-S);  Surgeon: MSherren Mocha MD;  [facility];  Service: Cardiovascular;  Laterality: N/A;   PHOTOCOAGULATION WITH LASER Right 07/19/2020   Procedure: PHOTOCOAGULATION WITH LASER;  Surgeon: ZBernarda Caffey MD;  [facility]  Service: Ophthalmology;  Laterality: Right;   UPPER GI ENDOSCOPY     URETEROSCOPY  10/2009   Family History  Problem Relation Age of Onset   Heart attack Father 421  LVAD   Suicidality Maternal Uncle    Hypertension Other    Social History   Socioeconomic History   Marital status: Married    Spouse name: Not on file   Number of children: 4   Years of education: Not on file   Highest education level: Some college, no degree  Occupational History   Occupation: Conservation officer, nature  Tobacco Use   Smoking status: Former    Packs/day: 0.50    Years: 20.00    Total pack years: 10.00    Types: Cigarettes    Quit date: 02/17/1990    Years since quitting: 32.0   Smokeless tobacco: Never  Vaping Use   Vaping Use: Never used  Substance and Sexual Activity   Alcohol use: Not Currently    Alcohol/week: 4.0 standard drinks of alcohol     Types: 2 Glasses of wine, 2 Cans of beer per week   Drug use: Never    Frequency: 7.0 times per week    Comment: 07/09/11 "lets say a cigarette a day" Last used   Sexual activity: Yes    Birth control/protection: None  Other Topics Concern   Not on file  Social History Narrative   Married and has 4 children.     Social Determinants of Health   Financial Resource Strain: Not on file  Food Insecurity: Not on file  Transportation Needs: Not on file  Physical Activity: Not on file  Stress: Not on file  Social Connections: Not on file     Review of Systems  Constitutional:  Negative for appetite change and unexpected weight change.  HENT:  Negative for congestion and sinus pressure.   Respiratory:  Negative for cough, chest tightness and shortness of breath.   Cardiovascular:  Negative for chest pain, palpitations and leg swelling.  Gastrointestinal:  Negative for abdominal pain, diarrhea, nausea and vomiting.  Genitourinary:  Negative for difficulty urinating and dysuria.  Musculoskeletal:  Negative for joint swelling and myalgias.  Skin:  Negative for color change and rash.  Neurological:  Negative for dizziness, light-headedness and headaches.  Psychiatric/Behavioral:  Negative for agitation and dysphoric mood.        Objective:     BP 138/70 (BP Location: Left Arm, Patient Position: Sitting, Cuff Size: Large)   Pulse 77   Temp 98.6 F (37 C) (Temporal)   Resp 16   Ht 6' (1.829 m)   Wt 210 lb (95.3 kg)   SpO2 97%   BMI 28.48 kg/m  Wt Readings from Last 3 Encounters:  02/20/22 210 lb (95.3 kg)  12/30/21 210 lb (95.3 kg)  11/12/21 211 lb 6.4 oz (95.9 kg)    Physical Exam Vitals reviewed.  Constitutional:      General: He is not in acute distress.    Appearance: Normal appearance. He is well-developed.  HENT:     Head: Normocephalic and atraumatic.     Right Ear: External ear normal.     Left Ear: External ear normal.  Eyes:     General: No scleral icterus.        Right eye: No discharge.        Left eye: No discharge.     Conjunctiva/sclera: Conjunctivae normal.  Cardiovascular:     Rate and Rhythm: Normal rate and regular rhythm.  Pulmonary:     Effort: Pulmonary effort is normal. No respiratory distress.     Breath sounds: Normal breath sounds.  Abdominal:     General: Bowel sounds are  normal.     Palpations: Abdomen is soft.     Tenderness: There is no abdominal tenderness.  Musculoskeletal:        General: No swelling or tenderness.     Cervical back: Neck supple. No tenderness.  Lymphadenopathy:     Cervical: No cervical adenopathy.  Skin:    Findings: No erythema or rash.  Neurological:     Mental Status: He is alert.  Psychiatric:        Mood and Affect: Mood normal.        Behavior: Behavior normal.      Outpatient Encounter Medications as of 02/20/2022  Medication Sig   amLODipine (NORVASC) 10 MG tablet Take 1 tablet (10 mg total) by mouth daily.   aspirin EC 81 MG tablet Take 1 tablet (81 mg total) by mouth daily. Swallow whole.   atorvastatin (LIPITOR) 20 MG tablet Take 1 tablet (20 mg total) by mouth daily.   BD HYPODERMIC NEEDLE 16G X 1" MISC FOR USE WITH TESTOSTERONE INJECTIONS   BD HYPODERMIC NEEDLE 18G X 1" MISC USE WITH TESTOSTERONE INJECTIONS   CINNAMON PO Take 1,000 mg by mouth 2 (two) times daily.    Cyanocobalamin (VITAMIN B-12) 5000 MCG SUBL Place 5,000 mcg under the tongue daily.   fluticasone (FLONASE) 50 MCG/ACT nasal spray USE 2 SPRAYS IN EACH NOSTRIL DAILY AS DIRECTED (Patient taking differently: Place 2 sprays into both nostrils at bedtime.)   hydrOXYzine (VISTARIL) 50 MG capsule Take 1 capsule (50 mg total) by mouth daily as needed for anxiety.   lamoTRIgine (LAMICTAL) 150 MG tablet Take 1 tablet (150 mg total) by mouth at bedtime. (Patient taking differently: Take 150 mg by mouth 2 (two) times daily.)   Lancets (ONETOUCH ULTRASOFT) lancets Use as instructed to check sugars once daily. Dx E 11.9    magnesium oxide (MAG-OX) 400 MG tablet Take 400 mg by mouth daily.   olmesartan-hydrochlorothiazide (BENICAR HCT) 40-25 MG tablet Take 1 tablet by mouth at bedtime.   pantoprazole (PROTONIX) 40 MG tablet Take 1 tablet (40 mg total) by mouth daily.   polyethylene glycol powder (GLYCOLAX/MIRALAX) powder TAKE 17 GRAMS BY MOUTH EVERY DAY as needed (as directed) (Patient taking differently: Take 17 g by mouth daily.)   spironolactone (ALDACTONE) 25 MG tablet Take 0.5 tablets (12.5 mg total) by mouth daily.   Syringe, Disposable, (B-D SYRINGE LUER-LOK 3CC) 3 ML MISC 1 mL by Does not apply route every 14 (fourteen) days.   SYRINGE-NEEDLE, DISP, 3 ML (B-D 3CC LUER-LOK SYR 22GX1") 22G X 1" 3 ML MISC FOR TESTOSTERONE INJECTIONS   tamsulosin (FLOMAX) 0.4 MG CAPS capsule TAKE ONE CAPSULE BY MOUTH ONE TIME DAILY   testosterone cypionate (DEPOTESTOSTERONE CYPIONATE) 200 MG/ML injection Inject 0.7 mLs (140 mg total) into the muscle every 7 (seven) days. (Patient taking differently: Inject 140 mg into the muscle every 7 (seven) days. Every Monday)   traZODone (DESYREL) 100 MG tablet Take 2 tablets (200 mg total) by mouth at bedtime as needed for sleep.   venlafaxine XR (EFFEXOR-XR) 150 MG 24 hr capsule Take 1 capsule (150 mg total) by mouth daily.   VITAMIN D, CHOLECALCIFEROL, PO Take 2,000 mg by mouth daily.   VITAMIN E PO Take 2,000 Units by mouth daily.   zinc gluconate 50 MG tablet Take 50 mg by mouth daily.   [DISCONTINUED] potassium citrate (UROCIT-K) 10 MEQ (1080 MG) SR tablet TAKE ONE TABLET BY MOUTH ONE TIME DAILY   amLODipine (NORVASC) 10 MG tablet Take  1 tablet (10 mg total) by mouth daily.   [DISCONTINUED] amLODipine (NORVASC) 10 MG tablet Take 1 tablet (10 mg total) by mouth daily.   [DISCONTINUED] bacitracin-polymyxin b (POLYSPORIN) ophthalmic ointment Place into the right eye at bedtime. Place a 1/2 inch ribbon of ointment into the lower eyelid.   [DISCONTINUED] glucose blood test strip Use as  instructed to check sugars once daily. Dx E11.9   [DISCONTINUED] lamoTRIgine (LAMICTAL) 200 MG tablet Take 1 tablet (200 mg total) by mouth daily.   [DISCONTINUED] prednisoLONE acetate (PRED FORTE) 1 % ophthalmic suspension Place 1 drop into the right eye 4 (four) times daily.   No facility-administered encounter medications on file as of 02/20/2022.     Lab Results  Component Value Date   WBC 7.0 11/07/2021   HGB 16.7 11/07/2021   HCT 49.6 11/07/2021   PLT 175.0 11/07/2021   GLUCOSE 126 (H) 11/07/2021   CHOL 113 11/07/2021   TRIG 111.0 11/07/2021   HDL 29.00 (L) 11/07/2021   LDLDIRECT 87.0 06/05/2020   LDLCALC 61 11/07/2021   ALT 32 11/07/2021   AST 29 11/07/2021   NA 138 11/07/2021   K 4.3 11/07/2021   CL 101 11/07/2021   CREATININE 1.26 11/07/2021   BUN 22 11/07/2021   CO2 30 11/07/2021   TSH 2.00 05/02/2021   PSA 0.38 08/05/2021   INR 1.11 10/19/2011   HGBA1C 6.5 11/07/2021   MICROALBUR 2.1 (H) 11/07/2021    No results found.     Assessment & Plan:  Primary hypertension Assessment & Plan: Continue Benicar/hctz.  Blood pressure elevated above goal. Amlodipine increased to '10mg'$  q day last visit.  Discussed other treatment options and need to have blood pressure control.  Add spironolactone '25mg'$  - take 1/2 tablet per day.  Follow metabolic panel.  Follow pressures.    Orders: -     Basic metabolic panel; Future  Abnormal liver function Assessment & Plan: Diet, exercise and weight loss.  Follow liver function tests.     Anxiety Assessment & Plan: Seeing psychiatry.  Appears to be doing well on current regimen.  Lamictal dose just decreased.  Discussed effexor and elevated blood pressure. Plans to discuss more with psychiatry. Continue f/u with psychiatry.    Bipolar 2 disorder [facility]) Assessment & Plan: Seeing psychiatry.  Appears to be doing well on current regimen.  Lamictal dose just decreased.  Discussed effexor and elevated blood pressure. Plans to discuss  more with psychiatry. Continue f/u with psychiatry.   Diabetes mellitus with cardiac complication [facility]) Assessment & Plan: Low carb diet and exercise. Follow met b and a1c.   Lab Results  Component Value Date   HGBA1C 6.5 11/07/2021     Orders: -     Hemoglobin A1c; Future  Gastroesophageal reflux disease with esophagitis without hemorrhage Assessment & Plan: No upper symptoms reported.  Continue protonix.    History of colonic polyps Assessment & Plan: Colonoscopy 02/04/22 - descending colon polyps (4) - adenomatous polyps.  Recommended f/u colonoscopy in one year discussed.  Plans to f/u with gastroenterology - [facility] Cal[facility].    Mixed hyperlipidemia Assessment & Plan: Continue lipitor.  Low cholesterol diet and exercise.  Follow lipid panel and liver function tests.  Lab Results  Component Value Date   CHOL 113 11/07/2021   HDL 29.00 (L) 11/07/2021   LDLCALC 61 11/07/2021   LDLDIRECT 87.0 06/05/2020   TRIG 111.0 11/07/2021   CHOLHDL 4 11/07/2021    Orders: -     CBC with  Differential/Platelet; Future -     Hepatic function panel; Future -     TSH; Future -     Lipid panel; Future  Left carotid bruit Assessment & Plan: Carotid ultrasound was ordered last visit.  Need to schedule.    Sleep apnea, unspecified type Assessment & Plan: Continue cpap.     Thrombocytopenia ([place]) Assessment & Plan: Follow cbc. Platelet count wnl recent checks.    Coronary artery disease of native artery of native heart with stable angina pectoris [facility]) Assessment & Plan: Followed by Dr Rockey Situ.  No chest pain or sob reported.  Continue risk factor modification. Continue benicar/hctz and amlodipine and atorvastatin.  Continue to get blood pressure under better control.    Other orders -     amLODIPine Besylate; Take 1 tablet (10 mg total) by mouth daily.  Dispense: 90 tablet; Refill: 1 -     Spironolactone; Take 0.5 tablets (12.5 mg total) by mouth daily.  Dispense: 30 tablet; Refill:  0 -     amLODIPine Besylate; Take 1 tablet (10 mg total) by mouth daily.  Dispense: 90 tablet; Refill: 3     Einar Pheasant, MD

## 2022-02-20 NOTE — Patient Instructions (Signed)
Stop potassium  Start spironolactone - 1/2 tablet per day

## 2022-02-22 ENCOUNTER — Encounter: Payer: Self-pay | Admitting: Internal Medicine

## 2022-02-22 NOTE — Assessment & Plan Note (Addendum)
Continue lipitor.  Low cholesterol diet and exercise.  Follow lipid panel and liver function tests.  Lab Results  Component Value Date   CHOL 113 11/07/2021   HDL 29.00 (L) 11/07/2021   LDLCALC 61 11/07/2021   LDLDIRECT 87.0 06/05/2020   TRIG 111.0 11/07/2021   CHOLHDL 4 11/07/2021

## 2022-02-22 NOTE — Assessment & Plan Note (Signed)
Seeing psychiatry.  Appears to be doing well on current regimen.  Lamictal dose just decreased.  Discussed effexor and elevated blood pressure. Plans to discuss more with psychiatry. Continue f/u with psychiatry.

## 2022-02-22 NOTE — Assessment & Plan Note (Signed)
Follow cbc. Platelet count wnl recent checks.

## 2022-02-22 NOTE — Assessment & Plan Note (Signed)
Low carb diet and exercise. Follow met b and a1c.   Lab Results  Component Value Date   HGBA1C 6.5 11/07/2021    

## 2022-02-22 NOTE — Assessment & Plan Note (Signed)
No upper symptoms reported.  Continue protonix.  

## 2022-02-22 NOTE — Assessment & Plan Note (Signed)
Continue Benicar/hctz.  Blood pressure elevated above goal. Amlodipine increased to '10mg'$  q day last visit.  Discussed other treatment options and need to have blood pressure control.  Add spironolactone '25mg'$  - take 1/2 tablet per day.  Follow metabolic panel.  Follow pressures.

## 2022-02-22 NOTE — Assessment & Plan Note (Signed)
Followed by Dr Rockey Situ.  No chest pain or sob reported.  Continue risk factor modification. Continue benicar/hctz and amlodipine and atorvastatin.  Continue to get blood pressure under better control.

## 2022-02-22 NOTE — Assessment & Plan Note (Signed)
Diet, exercise and weight loss. Follow liver function tests.   

## 2022-02-22 NOTE — Assessment & Plan Note (Signed)
Carotid ultrasound was ordered last visit.  Need to schedule.

## 2022-02-22 NOTE — Assessment & Plan Note (Addendum)
Seeing psychiatry.  Appears to be doing well on current regimen.  Lamictal dose just decreased.  Discussed effexor and elevated blood pressure. Plans to discuss more with psychiatry. Continue f/u with psychiatry.

## 2022-02-22 NOTE — Assessment & Plan Note (Signed)
Colonoscopy 02/04/22 - descending colon polyps (4) - adenomatous polyps.  Recommended f/u colonoscopy in one year discussed.  Plans to f/u with gastroenterology - [facility] Cal[facility].

## 2022-02-22 NOTE — Assessment & Plan Note (Addendum)
Continue cpap.  

## 2022-02-23 NOTE — Progress Notes (Deleted)
BH MD/PA/NP OP Progress Note  02/23/2022 3:01 PM Keith Gross.  MRN:  [id number]  Chief Complaint: No chief complaint on file.  HPI: ***  hypertension  Visit Diagnosis: No diagnosis found.  Past Psychiatric History: Please see initial evaluation for full details. I have reviewed the history. No updates at this time.     Past Medical History:  Past Medical History:  Diagnosis Date   Acid reflux    Angina 2013   no current problems   Anxiety    Arthritis    Bipolar disorder ([place])    type 2   Depression    Diabetes mellitus without complication ([place])    type 2   Diastasis of muscle 2011   "abdominal"   Dysplastic polyp of colon 2008   [facility]   GERD (gastroesophageal reflux disease)    Gout    Multiple episodes - no current problems   Heart disease    History of kidney stones    surgery to remove stones   Hyperlipidemia    Hypertension    Myocardial infarction ([place]) 05/2011   "we think";  06/2011 abnl mv;  06/2011 Cath - subtotal occlusion of Ramus, otw nonobs dzs;  06/2011 PCI/DES of Ramus w/ 2.25x74m Resolute DES    Sleep apnea    uses CPAP nightly   Syncopal episodes 05/2020   Syncope 05/2020   Treadmill stress test negative for angina pectoris 2011    Past Surgical History:  Procedure Laterality Date   25 GAUGE PARS PLANA VITRECTOMY WITH 20 GAUGE MVR PORT FOR MACULAR HOLE Right 07/19/2020   Procedure: 25 GAUGE [place]  Surgeon: ZBernarda Caffey MD;  [facility]  Service: Ophthalmology;  Laterality: Right;   CARDIAC CATHETERIZATION  06/2011   "diagnostic"   COLONOSCOPY     CORONARY ANGIOPLASTY WITH STENT PLACEMENT  07/09/11   "1"   GAS/FLUID EXCHANGE Right 07/19/2020   Procedure: GAS/FLUID EXCHANGE;  Surgeon: ZBernarda Caffey MD;  [facility]  Service: Ophthalmology;  Laterality: Right;  c3 f8   HEMORRHOID SURGERY  2000   INGUINAL HERNIA REPAIR  1987   left   LEFT HEART CATHETERIZATION WITH CORONARY ANGIOGRAM N/A 10/21/2011    Procedure: LEFT HEART CATHETERIZATION WITH CORONARY ANGIOGRAM;  Surgeon: THillary Bow MD;  [facility];  Service: Cardiovascular;  Laterality: N/A;   LITHOTRIPSY  10/2008   MEMBRANE PEEL Right 07/19/2020   Procedure: MEMBRANE PEEL;  Surgeon: ZBernarda Caffey MD;  [facility]  Service: Ophthalmology;  Laterality: Right;   PERCUTANEOUS CORONARY STENT INTERVENTION (PCI-S) N/A 07/09/2011   Procedure: PERCUTANEOUS CORONARY STENT INTERVENTION (PCI-S);  Surgeon: MSherren Mocha MD;  [facility];  Service: Cardiovascular;  Laterality: N/A;   PHOTOCOAGULATION WITH LASER Right 07/19/2020   Procedure: PHOTOCOAGULATION WITH LASER;  Surgeon: ZBernarda Caffey MD;  [facility]  Service: Ophthalmology;  Laterality: Right;   UPPER GI ENDOSCOPY     URETEROSCOPY  10/2009    Family Psychiatric History: Please see initial evaluation for full details. I have reviewed the history. No updates at this time.     Family History:  Family History  Problem Relation Age of Onset   Heart attack Father 49      LVAD   Suicidality Maternal Uncle    Hypertension Other     Social History:  Social History   Socioeconomic History   Marital status: Married    Spouse name: Not on file  Number of children: 4   Years of education: Not on file   Highest education level: Some college, no degree  Occupational History   Occupation: Conservation officer, nature  Tobacco Use   Smoking status: Former    Packs/day: 0.50    Years: 20.00    Total pack years: 10.00    Types: Cigarettes    Quit date: 02/17/1990    Years since quitting: 32.0   Smokeless tobacco: Never  Vaping Use   Vaping Use: Never used  Substance and Sexual Activity   Alcohol use: Not Currently    Alcohol/week: 4.0 standard drinks of alcohol    Types: 2 Glasses of wine, 2 Cans of beer per week   Drug use: Never    Frequency: 7.0 times per week    Comment: 07/09/11 "lets say a cigarette a day" Last used   Sexual activity: Yes     Birth control/protection: None  Other Topics Concern   Not on file  Social History Narrative   Married and has 4 children.     Social Determinants of Health   Financial Resource Strain: Not on file  Food Insecurity: Not on file  Transportation Needs: Not on file  Physical Activity: Not on file  Stress: Not on file  Social Connections: Not on file    Allergies:  Allergies  Allergen Reactions   Seasonal Ic [Octacosanol]     Metabolic Disorder Labs: Lab Results  Component Value Date   HGBA1C 6.5 11/07/2021   MPG 114 10/21/2011   No results found for: "PROLACTIN" Lab Results  Component Value Date   CHOL 113 11/07/2021   TRIG 111.0 11/07/2021   HDL 29.00 (L) 11/07/2021   CHOLHDL 4 11/07/2021   VLDL 22.2 11/07/2021   LDLCALC 61 11/07/2021   LDLCALC 68 08/05/2021   Lab Results  Component Value Date   TSH 2.00 05/02/2021   TSH 2.79 06/05/2020    Therapeutic Level Labs: No results found for: "LITHIUM" No results found for: "VALPROATE" No results found for: "CBMZ"  Current Medications: Current Outpatient Medications  Medication Sig Dispense Refill   amLODipine (NORVASC) 10 MG tablet Take 1 tablet (10 mg total) by mouth daily. 90 tablet 1   amLODipine (NORVASC) 10 MG tablet Take 1 tablet (10 mg total) by mouth daily. 90 tablet 3   aspirin EC 81 MG tablet Take 1 tablet (81 mg total) by mouth daily. Swallow whole. 90 tablet 3   atorvastatin (LIPITOR) 20 MG tablet Take 1 tablet (20 mg total) by mouth daily. 90 tablet 3   BD HYPODERMIC NEEDLE 16G X 1" MISC FOR USE WITH TESTOSTERONE INJECTIONS 25 each 3   BD HYPODERMIC NEEDLE 18G X 1" MISC USE WITH TESTOSTERONE INJECTIONS     CINNAMON PO Take 1,000 mg by mouth 2 (two) times daily.      Cyanocobalamin (VITAMIN B-12) 5000 MCG SUBL Place 5,000 mcg under the tongue daily.     fluticasone (FLONASE) 50 MCG/ACT nasal spray USE 2 SPRAYS IN EACH NOSTRIL DAILY AS DIRECTED (Patient taking differently: Place 2 sprays into both  nostrils at bedtime.) 48 g 1   hydrOXYzine (VISTARIL) 50 MG capsule Take 1 capsule (50 mg total) by mouth daily as needed for anxiety. 90 capsule 0   lamoTRIgine (LAMICTAL) 150 MG tablet Take 1 tablet (150 mg total) by mouth at bedtime. (Patient taking differently: Take 150 mg by mouth 2 (two) times daily.) 90 tablet 0   Lancets (ONETOUCH ULTRASOFT) lancets Use as instructed to  check sugars once daily. Dx E 11.9 100 each 12   magnesium oxide (MAG-OX) 400 MG tablet Take 400 mg by mouth daily.     olmesartan-hydrochlorothiazide (BENICAR HCT) 40-25 MG tablet Take 1 tablet by mouth at bedtime. 90 tablet 3   pantoprazole (PROTONIX) 40 MG tablet Take 1 tablet (40 mg total) by mouth daily. 90 tablet 0   polyethylene glycol powder (GLYCOLAX/MIRALAX) powder TAKE 17 GRAMS BY MOUTH EVERY DAY as needed (as directed) (Patient taking differently: Take 17 g by mouth daily.) 527 g 1   spironolactone (ALDACTONE) 25 MG tablet Take 0.5 tablets (12.5 mg total) by mouth daily. 30 tablet 0   Syringe, Disposable, (B-D SYRINGE LUER-LOK 3CC) 3 ML MISC 1 mL by Does not apply route every 14 (fourteen) days. 25 each 0   SYRINGE-NEEDLE, DISP, 3 ML (B-D 3CC LUER-LOK SYR 22GX1") 22G X 1" 3 ML MISC FOR TESTOSTERONE INJECTIONS 25 each 0   tamsulosin (FLOMAX) 0.4 MG CAPS capsule TAKE ONE CAPSULE BY MOUTH ONE TIME DAILY 90 capsule 1   testosterone cypionate (DEPOTESTOSTERONE CYPIONATE) 200 MG/ML injection Inject 0.7 mLs (140 mg total) into the muscle every 7 (seven) days. (Patient taking differently: Inject 140 mg into the muscle every 7 (seven) days. Every Monday) 10 mL 0   traZODone (DESYREL) 100 MG tablet Take 2 tablets (200 mg total) by mouth at bedtime as needed for sleep. 180 tablet 0   venlafaxine XR (EFFEXOR-XR) 150 MG 24 hr capsule Take 1 capsule (150 mg total) by mouth daily. 90 capsule 0   VITAMIN D, CHOLECALCIFEROL, PO Take 2,000 mg by mouth daily.     VITAMIN E PO Take 2,000 Units by mouth daily.     zinc gluconate 50 MG  tablet Take 50 mg by mouth daily.     No current facility-administered medications for this visit.     Musculoskeletal: Strength & Muscle Tone: within normal limits Gait & Station: normal Patient leans: N/A  Psychiatric Specialty Exam: Review of Systems  There were no vitals taken for this visit.There is no height or weight on file to calculate BMI.  General Appearance: {Appearance:22683}  Eye Contact:  {BHH EYE CONTACT:22684}  Speech:  Clear and Coherent  Volume:  Normal  Mood:  {BHH MOOD:22306}  Affect:  {Affect (PAA):22687}  Thought Process:  Coherent  Orientation:  Full (Time, Place, and Person)  Thought Content: Logical   Suicidal Thoughts:  {ST/HT (PAA):22692}  Homicidal Thoughts:  {ST/HT (PAA):22692}  Memory:  Immediate;   Good  Judgement:  {Judgement (PAA):22694}  Insight:  {Insight (PAA):22695}  Psychomotor Activity:  Normal  Concentration:  Concentration: Good and Attention Span: Good  Recall:  Good  Fund of Knowledge: Good  Language: Good  Akathisia:  No  Handed:  Right  AIMS (if indicated): not done  Assets:  Communication Skills Desire for Improvement  ADL's:  Intact  Cognition: WNL  Sleep:  {BHH GOOD/FAIR/POOR:22877}   Screenings: [facility] Office Visit from 02/20/2022 in [place] Office Visit from 12/19/2021 in [place] Office Visit from 11/12/2021 in [place] Office Visit from 08/12/2021 in [place] Video Visit from 10/19/2019 in [place]  PHQ-2 Total Score 0 0 0 0 0      [place] Visit from 12/19/2021 in [place] Admission (Discharged) from 07/19/2020 in [place] No Risk No Risk  Assessment and Plan:  Khadim Lundberg. is a 67 y.o. year old male with a history of Bipolar II disorder, CAD, diabetes, GERD, hyperlipidemia , who  presents for follow up appointment for below.    1. Bipolar II disorder ([place]) 2. GAD (generalized anxiety disorder) He denies any significant mood symptoms except self-limiting anxiety in relation to work stress over the past few years under the current medication regimen.  He reports good relationship with his wife, and feels satisfied with his current life.  He was advised to hold hydroxyzine if able.  Will plan to lower the dose of venlafaxine in the future if he does not require any hydroxyzine.  Will continue current dose at this time to target depression and anxiety.  Discussed potential risk of hypertension.  Will continue lamotrigine to target bipolar disorder.  Discussed potential risk of Stevens-Johnson syndrome.  Noted that he denies any hypomanic symptoms except irritability and excitement.  He denies family history of bipolar disorder, although he does have family members with suicide attempt on maternal side.  Will continue to assess this.    # Insomnia He reports good benefit from trazodone.  Will continue current dose to target insomnia.    Plan Continue venlafaxine 150 mg daily Continue lamotrigine 150 mg in a.m., 200 mg at night Decrease hydroxyzine 50 mg daily as needed for anxiety (was on 50 mg scheduled) Continue trazodone 200 mg at night as needed for sleep Next appointment: 1/9 at 4 PM for 30 mins. In person   The patient demonstrates the following risk factors for suicide: Chronic risk factors for suicide include: psychiatric disorder of depression . Acute risk factors for suicide include: N/A. Protective factors for this patient include: positive social support, responsibility to others (children, family), coping skills, and hope for the future. Considering these factors, the overall suicide risk at this point appears to be low. Patient is appropriate for outpatient follow up.         Collaboration of Care: Collaboration of Care: {BH OP Collaboration of  Care:21014065}  Patient/Guardian was advised Release of Information must be obtained prior to any record release in order to collaborate their care with an outside provider. Patient/Guardian was advised if they have not already done so to contact the registration department to sign all necessary forms in order for [place] to release information regarding their care.   Consent: Patient/Guardian gives verbal consent for treatment and assignment of benefits for services provided during this visit. Patient/Guardian expressed understanding and agreed to proceed.    Norman Clay, MD 02/23/2022, 3:01 PM

## 2022-02-25 ENCOUNTER — Ambulatory Visit: Payer: Medicare HMO | Admitting: Psychiatry

## 2022-02-26 DIAGNOSIS — R3912 Poor urinary stream: Secondary | ICD-10-CM | POA: Diagnosis not present

## 2022-02-26 DIAGNOSIS — E291 Testicular hypofunction: Secondary | ICD-10-CM | POA: Diagnosis not present

## 2022-02-26 DIAGNOSIS — Z87442 Personal history of urinary calculi: Secondary | ICD-10-CM | POA: Diagnosis not present

## 2022-02-26 DIAGNOSIS — N401 Enlarged prostate with lower urinary tract symptoms: Secondary | ICD-10-CM | POA: Diagnosis not present

## 2022-03-03 ENCOUNTER — Other Ambulatory Visit: Payer: Self-pay

## 2022-03-03 ENCOUNTER — Encounter: Payer: Self-pay | Admitting: Internal Medicine

## 2022-03-03 MED ORDER — TAMSULOSIN HCL 0.4 MG PO CAPS: 0.4000 mg | ORAL_CAPSULE | Freq: Every day | ORAL | 1 refills | Status: DC

## 2022-03-03 MED ORDER — PANTOPRAZOLE SODIUM 40 MG PO TBEC: 40.0000 mg | DELAYED_RELEASE_TABLET | Freq: Every day | ORAL | 1 refills | Status: DC

## 2022-03-03 NOTE — Telephone Encounter (Signed)
Ok to schedule fasting labs.  Labs are ordered.  I am confused about what he is doing with his medications.  He is supposed to be on the benicar/hctz and amlodipine.  Last visit, I added spironolactone '25mg'$  - take 1/2 tablet per day, with plans to increase spironolactone for better blood pressure control.  Need to confirm why he is stopping amlodipine.

## 2022-03-04 NOTE — Telephone Encounter (Signed)
Lm for pt to cb to clarify

## 2022-03-05 DIAGNOSIS — H59811 Chorioretinal scars after surgery for detachment, right eye: Secondary | ICD-10-CM | POA: Diagnosis not present

## 2022-03-05 DIAGNOSIS — H43812 Vitreous degeneration, left eye: Secondary | ICD-10-CM | POA: Diagnosis not present

## 2022-03-05 DIAGNOSIS — H35372 Puckering of macula, left eye: Secondary | ICD-10-CM | POA: Diagnosis not present

## 2022-03-05 DIAGNOSIS — H35342 Macular cyst, hole, or pseudohole, left eye: Secondary | ICD-10-CM | POA: Diagnosis not present

## 2022-03-05 DIAGNOSIS — H43392 Other vitreous opacities, left eye: Secondary | ICD-10-CM | POA: Diagnosis not present

## 2022-03-06 DIAGNOSIS — N5201 Erectile dysfunction due to arterial insufficiency: Secondary | ICD-10-CM | POA: Diagnosis not present

## 2022-03-07 ENCOUNTER — Encounter: Payer: Self-pay | Admitting: Cardiovascular Disease

## 2022-03-07 ENCOUNTER — Telehealth: Payer: Self-pay | Admitting: Cardiovascular Disease

## 2022-03-07 NOTE — Telephone Encounter (Signed)
   Pre-operative Risk Assessment    Patient Name: Keith Gross.  DOB: October 01, 1955 MRN: [id number]      Request for Surgical Clearance    Procedure:   retinal surgery (vitrectomy, membrane peel, gas fluid exchange left eye)  Date of Surgery:  Clearance 03/20/22                                 Surgeon:  Dr Sherlynn Stalls Surgeon's Group or Practice Name:  [facility] Phone number:  [phone] Fax number:  [phone]   Type of Clearance Requested:   - Pharmacy:  Hold Aspirin any blood thinners   Type of Anesthesia:  MAC   Additional requests/questions:    Keith Gross   03/07/2022, 11:02 AM

## 2022-03-07 NOTE — Telephone Encounter (Signed)
   Name: Keith Gross.  DOB: [birth year]  MRN: [id number]  Primary Cardiologist: Ida Rogue, MD   Preoperative team, please contact this patient and set up a phone call appointment for further preoperative risk assessment. Please obtain consent and complete medication review. Thank you for your help.  I confirm that guidance regarding antiplatelet and oral anticoagulation therapy has been completed and, if necessary, noted below.  His aspirin may be be held for 7 days prior to his procedure.  Please resume as soon as possible postoperatively once hemostasis has been achieved at the discretion of the surgeon.   Deberah Pelton, NP 03/07/2022, 11:19 AM [place]

## 2022-03-07 NOTE — Telephone Encounter (Signed)
I left a message for the patient to call our office back to set up a tele visit for pre-op.

## 2022-03-12 ENCOUNTER — Telehealth: Payer: Self-pay | Admitting: *Deleted

## 2022-03-12 NOTE — Telephone Encounter (Signed)
PATIENT SCHEDULE  03-13-22  MED REC AND CONSENT DONE SMO     Patient Consent for Virtual Visit    Keith Mehring. has provided verbal consent on 03/12/2022 for a virtual visit (video or telephone).  CONSENT FOR VIRTUAL VISIT FOR:  Keith Gross.  By participating in this virtual visit I agree to the following:  I hereby voluntarily request, consent and authorize [facility] and its employed or contracted physicians, physician assistants, nurse practitioners or other licensed health care professionals (the Practitioner), to provide me with telemedicine health care services (the "Services") as deemed necessary by the treating Practitioner. I acknowledge and consent to receive the Services by the Practitioner via telemedicine. I understand that the telemedicine visit will involve communicating with the Practitioner through live audiovisual communication technology and the disclosure of certain medical information by electronic transmission. I acknowledge that I have been given the opportunity to request an in-person assessment or other available alternative prior to the telemedicine visit and am voluntarily participating in the telemedicine visit.  I understand that I have the right to withhold or withdraw my consent to the use of telemedicine in the course of my care at any time, without affecting my right to future care or treatment, and that the Practitioner or I may terminate the telemedicine visit at any time. I understand that I have the right to inspect all information obtained and/or recorded in the course of the telemedicine visit and may receive copies of available information for a reasonable fee.  I understand that some of the potential risks of receiving the Services via telemedicine include:  Delay or interruption in medical evaluation due to technological equipment failure or disruption; Information transmitted may not be sufficient (e.g. poor resolution of images) to allow  for appropriate medical decision making by the Practitioner; and/or  In rare instances, security protocols could fail, causing a breach of personal health information.  Furthermore, I acknowledge that it is my responsibility to provide information about my medical history, conditions and care that is complete and accurate to the best of my ability. I acknowledge that Practitioner's advice, recommendations, and/or decision may be based on factors not within their control, such as incomplete or inaccurate data provided by me or distortions of diagnostic images or specimens that may result from electronic transmissions. I understand that the practice of medicine is not an exact science and that Practitioner makes no warranties or guarantees regarding treatment outcomes. I acknowledge that a copy of this consent can be made available to me via my patient portal ([facility]), or I can request a printed copy by calling the office of [facility].    I understand that my insurance will be billed for this visit.   I have read or had this consent read to me. I understand the contents of this consent, which adequately explains the benefits and risks of the Services being provided via telemedicine.  I have been provided ample opportunity to ask questions regarding this consent and the Services and have had my questions answered to my satisfaction. I give my informed consent for the services to be provided through the use of telemedicine in my medical care

## 2022-03-12 NOTE — Telephone Encounter (Signed)
PATIENT SCHEDULED 03-13-22

## 2022-03-14 ENCOUNTER — Ambulatory Visit: Payer: PPO | Attending: Cardiology | Admitting: Physician Assistant

## 2022-03-14 DIAGNOSIS — Z01810 Encounter for preprocedural cardiovascular examination: Secondary | ICD-10-CM | POA: Diagnosis not present

## 2022-03-14 NOTE — Progress Notes (Signed)
Virtual Visit via Telephone Note   Because of Keith BARRET Jr.'s co-morbid illnesses, he is at least at moderate risk for complications without adequate follow up.  This format is felt to be most appropriate for this patient at this time.  The patient did not have access to video technology/had technical difficulties with video requiring transitioning to audio format only (telephone).  All issues noted in this document were discussed and addressed.  No physical exam could be performed with this format.  Please refer to the patient's chart for his consent to telehealth for [facility]  Evaluation Performed:  Preoperative cardiovascular risk assessment _____________   Date:  03/14/2022   Patient ID:  Keith Lemming., DOB Sep 24, 1955, MRN [id number] Patient Location:  Home Provider location:   Office  Primary Care Provider:  Einar Pheasant, MD Primary Cardiologist:  Ida Rogue, MD  Chief Complaint / Patient Profile   67 y.o. y/o male with a h/o anxiety, acid reflux, bipolar disorder, hypertension, hyperlipidemia, MI with PCI in May 2013, and syncope who is pending retinal surgery (vitrectomy, membrane peel, gas fluid exchange left eye) and presents today for telephonic preoperative cardiovascular risk assessment.  History of Present Illness    Keith Mehta. is a 67 y.o. male who presents via audio/video conferencing for a telehealth visit today.  Pt was last seen in cardiology clinic on 06/26/2021 by Dr. Rockey Situ.  At that time Keith Shells. was doing well .  The patient is now pending procedure as outlined above. Since his last visit, he states he has been doing really well from a cardiac standpoint.  He had not had any chest pain in 10 years since his stent placement in 2013.  He has no issues with walking or even running.  He has scored an 8.27 METS on the DASI.  This well exceeds the 4 METS minimum requirement.  His aspirin may be be held for 7 days prior to his  procedure.  Please resume as soon as possible postoperatively once hemostasis has been achieved at the discretion of the surgeon.    Past Medical History    Past Medical History:  Diagnosis Date   Acid reflux    Angina 2013   no current problems   Anxiety    Arthritis    Bipolar disorder ([place])    type 2   Depression    Diabetes mellitus without complication ([place])    type 2   Diastasis of muscle 2011   "abdominal"   Dysplastic polyp of colon 2008   [facility]   GERD (gastroesophageal reflux disease)    Gout    Multiple episodes - no current problems   Heart disease    History of kidney stones    surgery to remove stones   Hyperlipidemia    Hypertension    Myocardial infarction ([place]) 05/2011   "we think";  06/2011 abnl mv;  06/2011 Cath - subtotal occlusion of Ramus, otw nonobs dzs;  06/2011 PCI/DES of Ramus w/ 2.25x60m Resolute DES    Sleep apnea    uses CPAP nightly   Syncopal episodes 05/2020   Syncope 05/2020   Treadmill stress test negative for angina pectoris 2011   Past Surgical History:  Procedure Laterality Date   25 GAUGE PARS PLANA VITRECTOMY WITH 20 GAUGE MVR PORT FOR MACULAR HOLE Right 07/19/2020   Procedure: 25 GAUGE [place]  Surgeon: ZBernarda Caffey MD;  [place] [facility]  Service: Ophthalmology;  Laterality: Right;   CARDIAC CATHETERIZATION  06/2011   "diagnostic"   COLONOSCOPY     CORONARY ANGIOPLASTY WITH STENT PLACEMENT  07/09/11   "1"   GAS/FLUID EXCHANGE Right 07/19/2020   Procedure: GAS/FLUID EXCHANGE;  Surgeon: Bernarda Caffey, MD;  Location: [place];  Service: Ophthalmology;  Laterality: Right;  c3 f8   HEMORRHOID SURGERY  2000   INGUINAL HERNIA REPAIR  1987   left   LEFT HEART CATHETERIZATION WITH CORONARY ANGIOGRAM N/A 10/21/2011   Procedure: LEFT HEART CATHETERIZATION WITH CORONARY ANGIOGRAM;  Surgeon: Hillary Bow, MD;  Location[facility] LAB;  Service: Cardiovascular;  Laterality: N/A;   LITHOTRIPSY  10/2008   MEMBRANE PEEL  Right 07/19/2020   Procedure: MEMBRANE PEEL;  Surgeon: Bernarda Caffey, MD;  Location: [place];  Service: Ophthalmology;  Laterality: Right;   PERCUTANEOUS CORONARY STENT INTERVENTION (PCI-S) N/A 07/09/2011   Procedure: PERCUTANEOUS CORONARY STENT INTERVENTION (PCI-S);  Surgeon: Sherren Mocha, MD;  Location[facility] C[facility] LAB;  Service: Cardiovascular;  Laterality: N/A;   PHOTOCOAGULATION WITH LASER Right 07/19/2020   Procedure: PHOTOCOAGULATION WITH LASER;  Surgeon: Bernarda Caffey, MD;  Location: [place];  Service: Ophthalmology;  Laterality: Right;   UPPER GI ENDOSCOPY     URETEROSCOPY  10/2009    Allergies  Allergies  Allergen Reactions   Seasonal Ic [Octacosanol]     Home Medications    Prior to Admission medications   Medication Sig Start Date End Date Taking? Authorizing Provider  amLODipine (NORVASC) 10 MG tablet Take 1 tablet (10 mg total) by mouth daily. 02/20/22   Einar Pheasant, MD  amLODipine (NORVASC) 10 MG tablet Take 1 tablet (10 mg total) by mouth daily. 02/20/22   Einar Pheasant, MD  aspirin EC 81 MG tablet Take 1 tablet (81 mg total) by mouth daily. Swallow whole. 06/26/21   Minna Merritts, MD  atorvastatin (LIPITOR) 20 MG tablet Take 1 tablet (20 mg total) by mouth daily. 06/26/21   Minna Merritts, MD  BD HYPODERMIC NEEDLE 16G X 1" MISC FOR USE WITH TESTOSTERONE INJECTIONS 10/10/19   Stoioff, Ronda Fairly, MD  BD HYPODERMIC NEEDLE 18G X 1" MISC USE WITH TESTOSTERONE INJECTIONS 03/27/20   [provider]  CINNAMON PO Take 1,000 mg by mouth 2 (two) times daily.     [provider]  Cyanocobalamin (VITAMIN B-12) 5000 MCG SUBL Place 5,000 mcg under the tongue daily.    [provider]  fluticasone (FLONASE) 50 MCG/ACT nasal spray USE 2 SPRAYS IN EACH NOSTRIL DAILY AS DIRECTED Patient taking differently: Place 2 sprays into both nostrils at bedtime. 07/14/17   Einar Pheasant, MD  hydrOXYzine (VISTARIL) 50 MG capsule Take 1 capsule (50 mg total) by mouth daily as needed  for anxiety. 12/19/21 03/19/22  Norman Clay, MD  lamoTRIgine (LAMICTAL) 150 MG tablet Take 1 tablet (150 mg total) by mouth at bedtime. Patient taking differently: Take 150 mg by mouth 2 (two) times daily. 12/19/21 03/19/22  Norman Clay, MD  Lancets [facility] ULTRASOFT) lancets Use as instructed to check sugars once daily. Dx E 11.9 06/19/17   Einar Pheasant, MD  magnesium oxide (MAG-OX) 400 MG tablet Take 400 mg by mouth daily.    [provider]  olmesartan-hydrochlorothiazide (BENICAR HCT) 40-25 MG tablet Take 1 tablet by mouth at bedtime. 06/26/21   Minna Merritts, MD  pantoprazole (PROTONIX) 40 MG tablet Take 1 tablet (40 mg total) by mouth daily. 03/03/22   Einar Pheasant, MD  polyethylene glycol powder (  GLYCOLAX/MIRALAX) powder TAKE 17 GRAMS BY MOUTH EVERY DAY as needed (as directed) Patient taking differently: Take 17 g by mouth daily. 01/14/17   Einar Pheasant, MD  spironolactone (ALDACTONE) 25 MG tablet Take 0.5 tablets (12.5 mg total) by mouth daily. 02/20/22   Einar Pheasant, MD  Syringe, Disposable, (B-D SYRINGE LUER-LOK 3CC) 3 ML MISC 1 mL by Does not apply route every 14 (fourteen) days. 03/15/20   McGowan, Larene Beach A, PA-C  SYRINGE-NEEDLE, DISP, 3 ML (B-D 3CC LUER-LOK SYR 22GX1") 22G X 1" 3 ML MISC FOR TESTOSTERONE INJECTIONS 03/15/20   Zara Council A, PA-C  tamsulosin (FLOMAX) 0.4 MG CAPS capsule Take 1 capsule (0.4 mg total) by mouth daily. 03/03/22   Einar Pheasant, MD  testosterone cypionate (DEPOTESTOSTERONE CYPIONATE) 200 MG/ML injection Inject 0.7 mLs (140 mg total) into the muscle every 7 (seven) days. Patient taking differently: Inject 140 mg into the muscle every 7 (seven) days. Every Monday 07/11/20   Zara Council A, PA-C  traZODone (DESYREL) 100 MG tablet Take 2 tablets (200 mg total) by mouth at bedtime as needed for sleep. 12/19/21 03/19/22  Norman Clay, MD  venlafaxine XR (EFFEXOR-XR) 150 MG 24 hr capsule Take 1 capsule (150 mg total) by mouth daily.  12/19/21 03/19/22  Norman Clay, MD  VITAMIN D, CHOLECALCIFEROL, PO Take 2,000 mg by mouth daily.    [provider]  VITAMIN E PO Take 2,000 Units by mouth daily.    [provider]  zinc gluconate 50 MG tablet Take 50 mg by mouth daily.    [provider]    Physical Exam    Vital Signs:  Keith Gross. does not have vital signs available for review today.  Given telephonic nature of communication, physical exam is limited. AAOx3. NAD. Normal affect.  Speech and respirations are unlabored.  Accessory Clinical Findings    None  Assessment & Plan    1.  Preoperative Cardiovascular Risk Assessment:  Keith Gross perioperative risk of a major cardiac event is 0.9% according to the Revised Cardiac Risk Index (RCRI).  Therefore, he is at low risk for perioperative complications.   His functional capacity is excellent at 8.27 METs according to the [place] Activity Status Index (DASI). Recommendations: According to ACC/AHA guidelines, no further cardiovascular testing needed.  The patient may proceed to surgery at acceptable risk.   Antiplatelet and/or Anticoagulation Recommendations: Aspirin can be held for 7 days prior to his surgery.  Please resume Aspirin post operatively when it is felt to be safe from a bleeding standpoint.    A copy of this note will be routed to requesting surgeon.  Time:   Today, I have spent 7 minutes with the patient with telehealth technology discussing medical history, symptoms, and management plan.     Elgie Collard, PA-C  03/14/2022, 7:41 AM

## 2022-03-20 DIAGNOSIS — H35342 Macular cyst, hole, or pseudohole, left eye: Secondary | ICD-10-CM | POA: Diagnosis not present

## 2022-03-20 DIAGNOSIS — H33312 Horseshoe tear of retina without detachment, left eye: Secondary | ICD-10-CM | POA: Diagnosis not present

## 2022-03-26 NOTE — Telephone Encounter (Signed)
LMTCB

## 2022-03-28 DIAGNOSIS — H35342 Macular cyst, hole, or pseudohole, left eye: Secondary | ICD-10-CM | POA: Diagnosis not present

## 2022-03-28 DIAGNOSIS — Z9889 Other specified postprocedural states: Secondary | ICD-10-CM | POA: Diagnosis not present

## 2022-03-28 DIAGNOSIS — H35372 Puckering of macula, left eye: Secondary | ICD-10-CM | POA: Diagnosis not present

## 2022-04-02 NOTE — Telephone Encounter (Signed)
LM FOR PT TO CB - 3 unsuccessful attempts to reach pt Letter sent

## 2022-04-09 ENCOUNTER — Other Ambulatory Visit: Payer: Self-pay | Admitting: Internal Medicine

## 2022-04-18 DIAGNOSIS — H35342 Macular cyst, hole, or pseudohole, left eye: Secondary | ICD-10-CM | POA: Diagnosis not present

## 2022-04-18 DIAGNOSIS — Z9889 Other specified postprocedural states: Secondary | ICD-10-CM | POA: Diagnosis not present

## 2022-04-18 DIAGNOSIS — H35372 Puckering of macula, left eye: Secondary | ICD-10-CM | POA: Diagnosis not present

## 2022-04-30 DIAGNOSIS — F3181 Bipolar II disorder: Secondary | ICD-10-CM | POA: Diagnosis not present

## 2022-04-30 DIAGNOSIS — F411 Generalized anxiety disorder: Secondary | ICD-10-CM | POA: Diagnosis not present

## 2022-06-08 ENCOUNTER — Other Ambulatory Visit: Payer: Self-pay | Admitting: Internal Medicine

## 2022-07-16 ENCOUNTER — Telehealth: Payer: Self-pay | Admitting: Internal Medicine

## 2022-07-16 NOTE — Telephone Encounter (Signed)
Copied from CRM [id number]. Topic: Medicare AWV >> Jul 16, 2022  2:11 PM Payton Doughty wrote: Reason for CRM: N/A 07/16/2022 FOR AWV - NO VOICEMAIL  Verlee Rossetti; Care Guide Ambulatory Clinical Support [place] [facility] Direct Dial: [phone]

## 2022-09-04 DIAGNOSIS — K219 Gastro-esophageal reflux disease without esophagitis: Secondary | ICD-10-CM | POA: Diagnosis not present

## 2022-09-04 DIAGNOSIS — G4733 Obstructive sleep apnea (adult) (pediatric): Secondary | ICD-10-CM | POA: Diagnosis not present

## 2022-09-04 DIAGNOSIS — E291 Testicular hypofunction: Secondary | ICD-10-CM | POA: Diagnosis not present

## 2022-09-04 DIAGNOSIS — I10 Essential (primary) hypertension: Secondary | ICD-10-CM | POA: Diagnosis not present

## 2022-09-04 DIAGNOSIS — I119 Hypertensive heart disease without heart failure: Secondary | ICD-10-CM | POA: Diagnosis not present

## 2022-09-04 DIAGNOSIS — F419 Anxiety disorder, unspecified: Secondary | ICD-10-CM | POA: Diagnosis not present

## 2022-09-04 DIAGNOSIS — E663 Overweight: Secondary | ICD-10-CM | POA: Diagnosis not present

## 2022-09-04 DIAGNOSIS — G4700 Insomnia, unspecified: Secondary | ICD-10-CM | POA: Diagnosis not present

## 2022-09-04 DIAGNOSIS — I25728 Atherosclerosis of autologous artery coronary artery bypass graft(s) with other forms of angina pectoris: Secondary | ICD-10-CM | POA: Diagnosis not present

## 2022-09-04 DIAGNOSIS — N400 Enlarged prostate without lower urinary tract symptoms: Secondary | ICD-10-CM | POA: Diagnosis not present

## 2022-09-04 DIAGNOSIS — Z87891 Personal history of nicotine dependence: Secondary | ICD-10-CM | POA: Diagnosis not present

## 2022-09-04 DIAGNOSIS — E785 Hyperlipidemia, unspecified: Secondary | ICD-10-CM | POA: Diagnosis not present

## 2022-09-11 ENCOUNTER — Telehealth: Payer: Self-pay

## 2022-09-11 ENCOUNTER — Other Ambulatory Visit: Payer: Self-pay | Admitting: Internal Medicine

## 2022-09-11 NOTE — Telephone Encounter (Signed)
LVM for patient to call back [phone], or to call PCP office to schedule follow up apt. AS, CMA  

## 2022-09-12 ENCOUNTER — Other Ambulatory Visit: Payer: Self-pay | Admitting: Cardiovascular Disease

## 2022-09-12 ENCOUNTER — Encounter: Payer: Self-pay | Admitting: Cardiovascular Disease

## 2022-09-12 ENCOUNTER — Other Ambulatory Visit: Payer: Self-pay | Admitting: Internal Medicine

## 2022-09-12 ENCOUNTER — Telehealth: Payer: Self-pay | Admitting: Cardiovascular Disease

## 2022-09-12 MED ORDER — ATORVASTATIN CALCIUM 20 MG PO TABS: 20.0000 mg | ORAL_TABLET | Freq: Every day | ORAL | 3 refills | Status: DC

## 2022-09-12 MED ORDER — OLMESARTAN MEDOXOMIL-HCTZ 40-25 MG PO TABS: 1.0000 | ORAL_TABLET | Freq: Every day | ORAL | 3 refills | Status: DC

## 2022-09-12 MED ORDER — SPIRONOLACTONE 25 MG PO TABS: 12.5000 mg | ORAL_TABLET | Freq: Every day | ORAL | 0 refills | Status: DC

## 2022-09-12 NOTE — Telephone Encounter (Signed)
Left voicemail to schedule appt from recall.

## 2022-09-12 NOTE — Telephone Encounter (Signed)
Left voice mail to schedule appt

## 2022-09-12 NOTE — Telephone Encounter (Signed)
Please contact pt for future appointment. Pt overdue for f/u. Pt needing refills. 

## 2022-09-15 ENCOUNTER — Encounter: Payer: Self-pay | Admitting: Internal Medicine

## 2022-09-15 ENCOUNTER — Other Ambulatory Visit: Payer: Self-pay

## 2022-09-15 ENCOUNTER — Telehealth: Payer: Self-pay | Admitting: Cardiovascular Disease

## 2022-09-15 MED ORDER — SPIRONOLACTONE 25 MG PO TABS: 12.5000 mg | ORAL_TABLET | Freq: Every day | ORAL | 0 refills | Status: DC

## 2022-09-15 NOTE — Telephone Encounter (Signed)
Left voicemail. Needs to schedule appt

## 2022-09-15 NOTE — Telephone Encounter (Signed)
Left voice mail

## 2022-09-18 ENCOUNTER — Encounter: Payer: Self-pay | Admitting: Cardiovascular Disease

## 2022-09-18 ENCOUNTER — Telehealth: Payer: Self-pay | Admitting: Cardiovascular Disease

## 2022-09-18 NOTE — Telephone Encounter (Signed)
Left voice mail to schedule appt

## 2022-09-18 NOTE — Telephone Encounter (Signed)
Left voice mail to schedule appt. Pt has been called 3x. Unable to reach letter being sent to patient via mail

## 2022-10-02 ENCOUNTER — Encounter (INDEPENDENT_AMBULATORY_CARE_PROVIDER_SITE_OTHER): Payer: Self-pay

## 2022-10-16 ENCOUNTER — Ambulatory Visit: Payer: PPO | Admitting: Internal Medicine

## 2022-10-22 DIAGNOSIS — F3181 Bipolar II disorder: Secondary | ICD-10-CM | POA: Diagnosis not present

## 2022-10-22 DIAGNOSIS — F411 Generalized anxiety disorder: Secondary | ICD-10-CM | POA: Diagnosis not present

## 2022-10-28 DIAGNOSIS — H59813 Chorioretinal scars after surgery for detachment, bilateral: Secondary | ICD-10-CM | POA: Diagnosis not present

## 2022-10-28 DIAGNOSIS — H35342 Macular cyst, hole, or pseudohole, left eye: Secondary | ICD-10-CM | POA: Diagnosis not present

## 2022-11-11 ENCOUNTER — Encounter: Payer: Self-pay | Admitting: Internal Medicine

## 2022-11-11 ENCOUNTER — Ambulatory Visit: Payer: PPO | Admitting: Internal Medicine

## 2022-11-11 VITALS — BP 124/72 | HR 68 | Temp 97.3°F | Ht 72.0 in | Wt 209.4 lb

## 2022-11-11 DIAGNOSIS — F3181 Bipolar II disorder: Secondary | ICD-10-CM

## 2022-11-11 DIAGNOSIS — F419 Anxiety disorder, unspecified: Secondary | ICD-10-CM | POA: Diagnosis not present

## 2022-11-11 DIAGNOSIS — Z125 Encounter for screening for malignant neoplasm of prostate: Secondary | ICD-10-CM | POA: Diagnosis not present

## 2022-11-11 DIAGNOSIS — K2100 Gastro-esophageal reflux disease with esophagitis, without bleeding: Secondary | ICD-10-CM

## 2022-11-11 DIAGNOSIS — M109 Gout, unspecified: Secondary | ICD-10-CM

## 2022-11-11 DIAGNOSIS — Z86010 Personal history of colonic polyps: Secondary | ICD-10-CM

## 2022-11-11 DIAGNOSIS — E1159 Type 2 diabetes mellitus with other circulatory complications: Secondary | ICD-10-CM | POA: Diagnosis not present

## 2022-11-11 DIAGNOSIS — R945 Abnormal results of liver function studies: Secondary | ICD-10-CM

## 2022-11-11 DIAGNOSIS — R0981 Nasal congestion: Secondary | ICD-10-CM

## 2022-11-11 DIAGNOSIS — G4730 Sleep apnea, unspecified: Secondary | ICD-10-CM

## 2022-11-11 DIAGNOSIS — D696 Thrombocytopenia, unspecified: Secondary | ICD-10-CM

## 2022-11-11 DIAGNOSIS — I10 Essential (primary) hypertension: Secondary | ICD-10-CM

## 2022-11-11 DIAGNOSIS — E782 Mixed hyperlipidemia: Secondary | ICD-10-CM

## 2022-11-11 DIAGNOSIS — I25118 Atherosclerotic heart disease of native coronary artery with other forms of angina pectoris: Secondary | ICD-10-CM

## 2022-11-11 LAB — BASIC METABOLIC PANEL
BUN: 25 mg/dL — ABNORMAL HIGH (ref 6–23)
CO2: 31 mEq/L (ref 19–32)
Calcium: 9.7 mg/dL (ref 8.4–10.5)
Chloride: 103 mEq/L (ref 96–112)
Creatinine, Ser: 1.22 mg/dL (ref 0.40–1.50)
GFR: 61.89 mL/min (ref >60.00)
Glucose, Bld: 98 mg/dL (ref 70–99)
Potassium: 3.9 mEq/L (ref 3.5–5.1)
Sodium: 141 mEq/L (ref 135–145)

## 2022-11-11 LAB — MICROALBUMIN / CREATININE URINE RATIO
Creatinine,U: 128.1 mg/dL
Microalb Creat Ratio: 0.9 mg/g (ref 0.0–30.0)
Microalb, Ur: 1.2 mg/dL (ref 0.0–1.9)

## 2022-11-11 LAB — CBC WITH DIFFERENTIAL/PLATELET
Basophils Absolute: 0 10*3/uL (ref 0.0–0.1)
Basophils Relative: 0.5 % (ref 0.0–3.0)
Eosinophils Absolute: 0.1 10*3/uL (ref 0.0–0.7)
Eosinophils Relative: 1.5 % (ref 0.0–5.0)
HCT: 46 % (ref 39.0–52.0)
Hemoglobin: 15.3 g/dL (ref 13.0–17.0)
Lymphocytes Relative: 25.2 % (ref 12.0–46.0)
Lymphs Abs: 1.6 10*3/uL (ref 0.7–4.0)
MCHC: 33.2 g/dL (ref 30.0–36.0)
MCV: 94.2 fl (ref 78.0–100.0)
Monocytes Absolute: 0.6 10*3/uL (ref 0.1–1.0)
Monocytes Relative: 9.2 % (ref 3.0–12.0)
Neutro Abs: 4 10*3/uL (ref 1.4–7.7)
Neutrophils Relative %: 63.6 % (ref 43.0–77.0)
Platelets: 152 10*3/uL (ref 150.0–400.0)
RBC: 4.88 Mil/uL (ref 4.22–5.81)
RDW: 13.8 % (ref 11.5–15.5)
WBC: 6.2 10*3/uL (ref 4.0–10.5)

## 2022-11-11 LAB — HM DIABETES FOOT EXAM

## 2022-11-11 LAB — HEPATIC FUNCTION PANEL
ALT: 22 U/L (ref 0–53)
AST: 26 U/L (ref 0–37)
Albumin: 4.6 g/dL (ref 3.5–5.2)
Alkaline Phosphatase: 42 U/L (ref 39–117)
Bilirubin, Direct: 0.1 mg/dL (ref 0.0–0.3)
Total Bilirubin: 0.7 mg/dL (ref 0.2–1.2)
Total Protein: 6.8 g/dL (ref 6.0–8.3)

## 2022-11-11 LAB — LIPID PANEL
Cholesterol: 123 mg/dL (ref 0–200)
HDL: 32.6 mg/dL — ABNORMAL LOW (ref >39.00)
LDL Cholesterol: 55 mg/dL (ref 0–99)
NonHDL: 90.86
Total CHOL/HDL Ratio: 4
Triglycerides: 179 mg/dL — ABNORMAL HIGH (ref 0.0–149.0)
VLDL: 35.8 mg/dL (ref 0.0–40.0)

## 2022-11-11 LAB — TSH: TSH: 5.06 u[IU]/mL (ref 0.35–5.50)

## 2022-11-11 LAB — HEMOGLOBIN A1C: Hgb A1c MFr Bld: 6.4 % (ref 4.6–6.5)

## 2022-11-11 LAB — PSA, MEDICARE: PSA: 0.5 ng/ml (ref 0.10–4.00)

## 2022-11-11 NOTE — Progress Notes (Signed)
Subjective:    Patient ID: Keith Kroner., male    DOB: 11-13-1955, 67 y.o.   MRN: [id number]  Patient here for  Chief Complaint  Patient presents with   Medication Management    HPI Here to follow up regarding hypercholesterolemia and hypertension.  Remains on benicar/hydrochlorothiazide, amlodipine and spironolactone. Since adding the spironolactone, blood pressures have done well.  No chest pain.  Breathing stable.  Does report increased sinus/nasal congestion - starting 4 weeks ago.  Started using steroid nasal spray, saline nasal spray, robitussin DM.  Symptoms much improved.  No increased sob reported.  No abdominal pain or bowel change reported.  Handling stress.  Work going well.    Past Medical History:  Diagnosis Date   Acid reflux    Angina 2013   no current problems   Anxiety    Arthritis    Bipolar disorder ([facility])    type 2   Depression    Diabetes mellitus without complication ([facility])    type 2   Diastasis of muscle 2011   "abdominal"   Dysplastic polyp of colon 2008   [facility]   GERD (gastroesophageal reflux disease)    Gout    Multiple episodes - no current problems   Heart disease    History of kidney stones    surgery to remove stones   Hyperlipidemia    Hypertension    Myocardial infarction ([facility]) 05/2011   "we think";  06/2011 abnl mv;  06/2011 Cath - subtotal occlusion of Ramus, otw nonobs dzs;  06/2011 PCI/DES of Ramus w/ 2.25x65mm Resolute DES    Sleep apnea    uses CPAP nightly   Syncopal episodes 05/2020   Syncope 05/2020   Treadmill stress test negative for angina pectoris 2011   Past Surgical History:  Procedure Laterality Date   25 GAUGE PARS PLANA VITRECTOMY WITH 20 GAUGE MVR PORT FOR MACULAR HOLE Right 07/19/2020   Procedure: 25 GAUGE PARS PLANA VITRECTOMY FOR MACULAR HOLE;  Surgeon: Rennis Chris, MD;  Location: [facility] OR;  Service: Ophthalmology;  Laterality: Right;   CARDIAC CATHETERIZATION  06/2011   "diagnostic"   COLONOSCOPY     CORONARY  ANGIOPLASTY WITH STENT PLACEMENT  07/09/11   "1"   GAS/FLUID EXCHANGE Right 07/19/2020   Procedure: GAS/FLUID EXCHANGE;  Surgeon: Rennis Chris, MD;  Location: [facility] OR;  Service: Ophthalmology;  Laterality: Right;  c3 f8   HEMORRHOID SURGERY  2000   INGUINAL HERNIA REPAIR  1987   left   LEFT HEART CATHETERIZATION WITH CORONARY ANGIOGRAM N/A 10/21/2011   Procedure: LEFT HEART CATHETERIZATION WITH CORONARY ANGIOGRAM;  Surgeon: Herby Abraham, MD;  Location: [facility] CATH LAB;  Service: Cardiovascular;  Laterality: N/A;   LITHOTRIPSY  10/2008   MEMBRANE PEEL Right 07/19/2020   Procedure: MEMBRANE PEEL;  Surgeon: Rennis Chris, MD;  Location: [facility] OR;  Service: Ophthalmology;  Laterality: Right;   PERCUTANEOUS CORONARY STENT INTERVENTION (PCI-S) N/A 07/09/2011   Procedure: PERCUTANEOUS CORONARY STENT INTERVENTION (PCI-S);  Surgeon: Tonny Bollman, MD;  Location: [facility] CATH LAB;  Service: Cardiovascular;  Laterality: N/A;   PHOTOCOAGULATION WITH LASER Right 07/19/2020   Procedure: PHOTOCOAGULATION WITH LASER;  Surgeon: Rennis Chris, MD;  Location: [facility] OR;  Service: Ophthalmology;  Laterality: Right;   UPPER GI ENDOSCOPY     URETEROSCOPY  10/2009   Family History  Problem Relation Age of Onset   Heart attack Father 32       LVAD   Suicidality Maternal Uncle    Hypertension  Other    Social History   Socioeconomic History   Marital status: Married    Spouse name: Not on file   Number of children: 4   Years of education: Not on file   Highest education level: Some college, no degree  Occupational History   Occupation: Horticulturist, commercial  Tobacco Use   Smoking status: Former    Current packs/day: 0.00    Average packs/day: 0.5 packs/day for 20.0 years (10.0 ttl pk-yrs)    Types: Cigarettes    Start date: 02/17/1970    Quit date: 02/17/1990    Years since quitting: 32.7   Smokeless tobacco: Never  Vaping Use   Vaping status: Never Used  Substance and Sexual Activity   Alcohol use: Not Currently     Alcohol/week: 4.0 standard drinks of alcohol    Types: 2 Glasses of wine, 2 Cans of beer per week   Drug use: Never    Frequency: 7.0 times per week    Comment: 07/09/11 "lets say a cigarette a day" Last used   Sexual activity: Yes    Birth control/protection: None  Other Topics Concern   Not on file  Social History Narrative   Married and has 4 children.     Social Determinants of Health   Financial Resource Strain: Not on file  Food Insecurity: Not on file  Transportation Needs: Not on file  Physical Activity: Not on file  Stress: Not on file  Social Connections: Not on file     Review of Systems  Constitutional:  Negative for appetite change and unexpected weight change.  HENT:  Positive for congestion. Negative for sinus pressure.   Respiratory:  Negative for cough, chest tightness and shortness of breath.   Cardiovascular:  Negative for chest pain and palpitations.  Gastrointestinal:  Negative for abdominal pain, diarrhea, nausea and vomiting.  Genitourinary:  Negative for difficulty urinating and dysuria.  Musculoskeletal:  Negative for joint swelling and myalgias.  Skin:  Negative for color change and rash.  Neurological:  Negative for dizziness and headaches.  Psychiatric/Behavioral:  Negative for agitation and dysphoric mood.        Objective:     BP 124/72   Pulse 68   Temp (!) 97.3 F (36.3 C) (Oral)   Ht 6' (1.829 m)   Wt 209 lb 6.4 oz (95 kg)   SpO2 95%   BMI 28.40 kg/m  Wt Readings from Last 3 Encounters:  11/11/22 209 lb 6.4 oz (95 kg)  02/20/22 210 lb (95.3 kg)  12/30/21 210 lb (95.3 kg)    Physical Exam Constitutional:      General: He is not in acute distress.    Appearance: Normal appearance. He is well-developed.  HENT:     Head: Normocephalic and atraumatic.     Right Ear: External ear normal.     Left Ear: External ear normal.  Eyes:     General: No scleral icterus.       Right eye: No discharge.        Left eye: No discharge.   Cardiovascular:     Rate and Rhythm: Normal rate and regular rhythm.  Pulmonary:     Effort: Pulmonary effort is normal. No respiratory distress.     Breath sounds: Normal breath sounds.  Abdominal:     General: Bowel sounds are normal.     Palpations: Abdomen is soft.     Tenderness: There is no abdominal tenderness.  Musculoskeletal:  General: No swelling or tenderness.     Cervical back: Neck supple. No tenderness.  Lymphadenopathy:     Cervical: No cervical adenopathy.  Skin:    Findings: No erythema or rash.  Neurological:     Mental Status: He is alert.  Psychiatric:        Mood and Affect: Mood normal.        Behavior: Behavior normal.      Outpatient Encounter Medications as of 11/11/2022  Medication Sig   amLODipine (NORVASC) 10 MG tablet Take 1 tablet (10 mg total) by mouth daily.   amLODipine (NORVASC) 10 MG tablet Take 1 tablet (10 mg total) by mouth daily.   aspirin EC 81 MG tablet Take 1 tablet (81 mg total) by mouth daily. Swallow whole.   atorvastatin (LIPITOR) 20 MG tablet Take 1 tablet (20 mg total) by mouth daily.   BD HYPODERMIC NEEDLE 16G X 1" MISC FOR USE WITH TESTOSTERONE INJECTIONS   BD HYPODERMIC NEEDLE 18G X 1" MISC USE WITH TESTOSTERONE INJECTIONS   CINNAMON PO Take 1,000 mg by mouth 2 (two) times daily.    Cyanocobalamin (VITAMIN B-12) 5000 MCG SUBL Place 5,000 mcg under the tongue daily.   fluticasone (FLONASE) 50 MCG/ACT nasal spray USE 2 SPRAYS IN EACH NOSTRIL DAILY AS DIRECTED (Patient taking differently: Place 2 sprays into both nostrils at bedtime.)   lamoTRIgine (LAMICTAL) 150 MG tablet Take 1 tablet (150 mg total) by mouth at bedtime. (Patient taking differently: Take 150 mg by mouth 2 (two) times daily.)   Lancets (ONETOUCH ULTRASOFT) lancets Use as instructed to check sugars once daily. Dx E 11.9   magnesium oxide (MAG-OX) 400 MG tablet Take 400 mg by mouth daily.   olmesartan-hydrochlorothiazide (BENICAR HCT) 40-25 MG tablet Take 1  tablet by mouth at bedtime.   pantoprazole (PROTONIX) 40 MG tablet TAKE 1 TABLET BY MOUTH EVERY DAY   polyethylene glycol powder (GLYCOLAX/MIRALAX) powder TAKE 17 GRAMS BY MOUTH EVERY DAY as needed (as directed) (Patient taking differently: Take 17 g by mouth daily.)   spironolactone (ALDACTONE) 25 MG tablet Take 0.5 tablets (12.5 mg total) by mouth daily.   Syringe, Disposable, (B-D SYRINGE LUER-LOK 3CC) 3 ML MISC 1 mL by Does not apply route every 14 (fourteen) days.   SYRINGE-NEEDLE, DISP, 3 ML (B-D 3CC LUER-LOK SYR 22GX1") 22G X 1" 3 ML MISC FOR TESTOSTERONE INJECTIONS   tamsulosin (FLOMAX) 0.4 MG CAPS capsule TAKE 1 CAPSULE BY MOUTH EVERY DAY   testosterone cypionate (DEPOTESTOSTERONE CYPIONATE) 200 MG/ML injection Inject 0.7 mLs (140 mg total) into the muscle every 7 (seven) days. (Patient taking differently: Inject 140 mg into the muscle every 7 (seven) days. Every Monday)   traZODone (DESYREL) 100 MG tablet Take 2 tablets (200 mg total) by mouth at bedtime as needed for sleep.   venlafaxine XR (EFFEXOR-XR) 150 MG 24 hr capsule Take 1 capsule (150 mg total) by mouth daily.   VITAMIN D, CHOLECALCIFEROL, PO Take 2,000 mg by mouth daily.   VITAMIN E PO Take 2,000 Units by mouth daily.   zinc gluconate 50 MG tablet Take 50 mg by mouth daily.   No facility-administered encounter medications on file as of 11/11/2022.     Lab Results  Component Value Date   WBC 6.2 11/11/2022   HGB 15.3 11/11/2022   HCT 46.0 11/11/2022   PLT 152.0 11/11/2022   GLUCOSE 98 11/11/2022   CHOL 123 11/11/2022   TRIG 179.0 (H) 11/11/2022   HDL 32.60 (L) 11/11/2022  LDLDIRECT 87.0 06/05/2020   LDLCALC 55 11/11/2022   ALT 22 11/11/2022   AST 26 11/11/2022   NA 141 11/11/2022   K 3.9 11/11/2022   CL 103 11/11/2022   CREATININE 1.22 11/11/2022   BUN 25 (H) 11/11/2022   CO2 31 11/11/2022   TSH 5.06 11/11/2022   PSA 0.50 11/11/2022   INR 1.11 10/19/2011   HGBA1C 6.4 11/11/2022   MICROALBUR 1.2 11/11/2022        Assessment & Plan:  Primary hypertension Assessment & Plan: Continue Benicar/hydrochlorothiazide and spironolactone as directed.   Follow metabolic panel.  Follow pressure.   Orders: -     Basic metabolic panel  Mixed hyperlipidemia Assessment & Plan: Continue lipitor.  Low cholesterol diet and exercise.  Follow lipid panel and liver function tests.  Lab Results  Component Value Date   CHOL 123 11/11/2022   HDL 32.60 (L) 11/11/2022   LDLCALC 55 11/11/2022   LDLDIRECT 87.0 06/05/2020   TRIG 179.0 (H) 11/11/2022   CHOLHDL 4 11/11/2022    Orders: -     Lipid panel -     TSH -     Hepatic function panel -     CBC with Differential/Platelet  Prostate cancer screening -     PSA, Medicare  Diabetes mellitus with cardiac complication (HCC) Assessment & Plan: Low carb diet and exercise. Follow met b and a1c.   Lab Results  Component Value Date   HGBA1C 6.4 11/11/2022     Orders: -     Hemoglobin A1c -     Microalbumin / creatinine urine ratio  Abnormal liver function Assessment & Plan: Diet, exercise and weight loss.  Follow liver function tests.     Anxiety Assessment & Plan: Seeing psychiatry.  Appears to be doing well on current regimen. Continues on lamictal and effexor. Continue f/u with psychiatry.    Bipolar 2 disorder [facility]) Assessment & Plan: Seeing psychiatry.  Appears to be doing well on current regimen. Continues on lamictal and effexor. Continue f/u with psychiatry.    Coronary artery disease of native artery of native heart with stable angina pectoris [facility]) Assessment & Plan: Followed by Dr Mariah Milling.  No chest pain or sob reported.  Continue risk factor modification. Continue benicar/hctz and amlodipine and atorvastatin.     Gastroesophageal reflux disease with esophagitis without hemorrhage Assessment & Plan: No upper symptoms reported.  Continue protonix.    Gout, unspecified cause, unspecified chronicity, unspecified site Assessment &  Plan: No recent flares reported. On allopurinol.      History of colonic polyps Assessment & Plan: Colonoscopy 02/04/22 - descending colon polyps (4) - adenomatous polyps.  Recommended f/u colonoscopy in one year discussed.  Plans to f/u with gastroenterology - [facility] Art.    Sleep apnea, unspecified type Assessment & Plan: Continue cpap.     Thrombocytopenia (HCC) Assessment & Plan: Follow cbc.    Nasal congestion Assessment & Plan: Continue saline nasal spray, steroid nasal spray and robitussin DM.  Symptoms improved.  Follow.  Notify me if persistent.        Dale Hope, MD

## 2022-11-14 ENCOUNTER — Encounter: Payer: Self-pay | Admitting: Internal Medicine

## 2022-11-14 DIAGNOSIS — R0981 Nasal congestion: Secondary | ICD-10-CM | POA: Insufficient documentation

## 2022-11-14 NOTE — Assessment & Plan Note (Signed)
Followed by Dr Mariah Milling.  No chest pain or sob reported.  Continue risk factor modification. Continue benicar/hctz and amlodipine and atorvastatin.

## 2022-11-14 NOTE — Assessment & Plan Note (Signed)
Diet, exercise and weight loss. Follow liver function tests.   

## 2022-11-14 NOTE — Assessment & Plan Note (Signed)
No upper symptoms reported.  Continue protonix.  

## 2022-11-14 NOTE — Assessment & Plan Note (Signed)
No recent flares reported. On allopurinol.

## 2022-11-14 NOTE — Assessment & Plan Note (Addendum)
Follow cbc.  

## 2022-11-14 NOTE — Assessment & Plan Note (Signed)
Continue lipitor.  Low cholesterol diet and exercise.  Follow lipid panel and liver function tests.  Lab Results  Component Value Date   CHOL 123 11/11/2022   HDL 32.60 (L) 11/11/2022   LDLCALC 55 11/11/2022   LDLDIRECT 87.0 06/05/2020   TRIG 179.0 (H) 11/11/2022   CHOLHDL 4 11/11/2022

## 2022-11-14 NOTE — Assessment & Plan Note (Signed)
Seeing psychiatry.  Appears to be doing well on current regimen. Continues on lamictal and effexor. Continue f/u with psychiatry.

## 2022-11-14 NOTE — Assessment & Plan Note (Signed)
Low carb diet and exercise. Follow met b and a1c.   Lab Results  Component Value Date   HGBA1C 6.4 11/11/2022

## 2022-11-14 NOTE — Assessment & Plan Note (Signed)
Continue Benicar/hydrochlorothiazide and spironolactone as directed.   Follow metabolic panel.  Follow pressure.

## 2022-11-14 NOTE — Assessment & Plan Note (Signed)
Continue saline nasal spray, steroid nasal spray and robitussin DM.  Symptoms improved.  Follow.  Notify me if persistent.

## 2022-11-14 NOTE — Assessment & Plan Note (Signed)
Colonoscopy 02/04/22 - descending colon polyps (4) - adenomatous polyps.  Recommended f/u colonoscopy in one year discussed.  Plans to f/u with gastroenterology - Keith Gross.

## 2022-11-14 NOTE — Assessment & Plan Note (Signed)
Continue cpap.  

## 2022-11-16 NOTE — Progress Notes (Unsigned)
Patient ID: Keith Fiorito., male   DOB: 08/06/1955, 67 y.o.   MRN: [id number] Cardiology Office Note  Date:  11/17/2022   ID:  Keith Zhang., DOB Oct 01, 1955, MRN [id number]  PCP:  Dale [place], MD   Chief Complaint  Patient presents with   12 month follow up     "Doing well." Medications reviewed by the patient verbally.     HPI:  Keith Gross is a very pleasant 67 year old gentleman syncope in 2010 felt secondary to low blood pressure that improved with medication changes,   chest pain symptoms at the beginning of  April 2013,  stress test showing inferior lateral hypoperfusion consistent with large region of scar which was new from 2010,  cardiac catheterization showing severe ramus disease, with DES stent placed in May 2013. catheterization September 2013 for chest pain showing patent stent, distal left circumflex disease noted, moderate in nature. He presents today for follow-up of his coronary artery disease  Last seen in clinic by myself May 2023 In general reports feeling well, denies significant orthostasis symptoms Takes blood pressure medication at nighttime which has helped,  tries to stay hydrated  Denies significant chest pain or shortness of breath concerning for angina Continues to workout with personal trainer and [place] several days per week Weight stable  Lab work reviewed BMP normal A1c 6.4 Lab Results  Component Value Date   CHOL 123 11/11/2022   HDL 32.60 (L) 11/11/2022   LDLCALC 55 11/11/2022   TRIG 179.0 (H) 11/11/2022    EKG personally reviewed by myself on todays visit EKG Interpretation Date/Time:  Monday November 17 2022 08:45:57 EDT Ventricular Rate:  69 PR Interval:  142 QRS Duration:  152 QT Interval:  424 QTC Calculation: 454 R Axis:   -32  Text Interpretation: Normal sinus rhythm Left axis deviation Right bundle branch block Inferior infarct , age undetermined When compared with ECG of 21-Oct-2011 05:10, Right bundle branch  block is now Present Confirmed by Julien Nordmann [id number]) on 11/17/2022 8:53:40 AM    Other past medical history reviewed  CPAP for  sleep apnea   previous history of smoking, quit in 1992.  Father had his first heart attack at age 74.  PMH:   has a past medical history of Acid reflux, Angina (2013), Anxiety, Arthritis, Bipolar disorder (HCC), Depression, Diabetes mellitus without complication (HCC), Diastasis of muscle (2011), Dysplastic polyp of colon (2008), GERD (gastroesophageal reflux disease), Gout, Heart disease, History of kidney stones, Hyperlipidemia, Hypertension, Myocardial infarction (HCC) (05/2011), Sleep apnea, Syncopal episodes (05/2020), Syncope (05/2020), and Treadmill stress test negative for angina pectoris (2011).  PSH:    Past Surgical History:  Procedure Laterality Date   25 GAUGE PARS PLANA VITRECTOMY WITH 20 GAUGE MVR PORT FOR MACULAR HOLE Right 07/19/2020   Procedure: 25 GAUGE PARS PLANA VITRECTOMY FOR MACULAR HOLE;  Surgeon: Rennis Chris, MD;  Location: [facility] OR;  Service: Ophthalmology;  Laterality: Right;   CARDIAC CATHETERIZATION  06/2011   "diagnostic"   COLONOSCOPY     CORONARY ANGIOPLASTY WITH STENT PLACEMENT  07/09/11   "1"   GAS/FLUID EXCHANGE Right 07/19/2020   Procedure: GAS/FLUID EXCHANGE;  Surgeon: Rennis Chris, MD;  Location: [facility] OR;  Service: Ophthalmology;  Laterality: Right;  c3 f8   HEMORRHOID SURGERY  2000   INGUINAL HERNIA REPAIR  1987   left   LEFT HEART CATHETERIZATION WITH CORONARY ANGIOGRAM N/A 10/21/2011   Procedure: LEFT HEART CATHETERIZATION WITH CORONARY ANGIOGRAM;  Surgeon: Herby Abraham, MD;  Location: [facility] CATH LAB;  Service: Cardiovascular;  Laterality: N/A;   LITHOTRIPSY  10/2008   MEMBRANE PEEL Right 07/19/2020   Procedure: MEMBRANE PEEL;  Surgeon: Rennis Chris, MD;  Location: [facility] OR;  Service: Ophthalmology;  Laterality: Right;   PERCUTANEOUS CORONARY STENT INTERVENTION (PCI-S) N/A 07/09/2011   Procedure: PERCUTANEOUS CORONARY STENT  INTERVENTION (PCI-S);  Surgeon: Tonny Bollman, MD;  Location: [facility] CATH LAB;  Service: Cardiovascular;  Laterality: N/A;   PHOTOCOAGULATION WITH LASER Right 07/19/2020   Procedure: PHOTOCOAGULATION WITH LASER;  Surgeon: Rennis Chris, MD;  Location: [facility] OR;  Service: Ophthalmology;  Laterality: Right;   UPPER GI ENDOSCOPY     URETEROSCOPY  10/2009    Current Outpatient Medications  Medication Sig Dispense Refill   amLODipine (NORVASC) 10 MG tablet Take 1 tablet (10 mg total) by mouth daily. 90 tablet 1   aspirin EC 81 MG tablet Take 1 tablet (81 mg total) by mouth daily. Swallow whole. 90 tablet 3   atorvastatin (LIPITOR) 20 MG tablet Take 1 tablet (20 mg total) by mouth daily. 90 tablet 3   CINNAMON PO Take 1,000 mg by mouth 2 (two) times daily.      Cyanocobalamin (VITAMIN B-12) 5000 MCG SUBL Place 5,000 mcg under the tongue daily.     fluticasone (FLONASE) 50 MCG/ACT nasal spray USE 2 SPRAYS IN EACH NOSTRIL DAILY AS DIRECTED (Patient taking differently: Place 2 sprays into both nostrils at bedtime.) 48 g 1   lamoTRIgine (LAMICTAL) 150 MG tablet Take 1 tablet (150 mg total) by mouth at bedtime. (Patient taking differently: Take 150 mg by mouth 2 (two) times daily.) 90 tablet 0   Lancets (ONETOUCH ULTRASOFT) lancets Use as instructed to check sugars once daily. Dx E 11.9 100 each 12   magnesium oxide (MAG-OX) 400 MG tablet Take 400 mg by mouth daily.     olmesartan-hydrochlorothiazide (BENICAR HCT) 40-25 MG tablet Take 1 tablet by mouth at bedtime. 90 tablet 3   pantoprazole (PROTONIX) 40 MG tablet TAKE 1 TABLET BY MOUTH EVERY DAY 100 tablet 1   polyethylene glycol powder (GLYCOLAX/MIRALAX) powder TAKE 17 GRAMS BY MOUTH EVERY DAY as needed (as directed) 527 g 1   spironolactone (ALDACTONE) 25 MG tablet Take 0.5 tablets (12.5 mg total) by mouth daily. 45 tablet 0   tamsulosin (FLOMAX) 0.4 MG CAPS capsule TAKE 1 CAPSULE BY MOUTH EVERY DAY 100 capsule 1   testosterone cypionate (DEPOTESTOSTERONE  CYPIONATE) 200 MG/ML injection Inject 0.7 mLs (140 mg total) into the muscle every 7 (seven) days. (Patient taking differently: Inject 140 mg into the muscle every 7 (seven) days. Every Monday) 10 mL 0   traZODone (DESYREL) 100 MG tablet Take 2 tablets (200 mg total) by mouth at bedtime as needed for sleep. 180 tablet 0   venlafaxine XR (EFFEXOR-XR) 150 MG 24 hr capsule Take 1 capsule (150 mg total) by mouth daily. 90 capsule 0   VITAMIN D, CHOLECALCIFEROL, PO Take 2,000 mg by mouth daily.     VITAMIN E PO Take 2,000 Units by mouth daily.     zinc gluconate 50 MG tablet Take 50 mg by mouth daily.     BD HYPODERMIC NEEDLE 16G X 1" MISC FOR USE WITH TESTOSTERONE INJECTIONS (Patient not taking: Reported on 11/17/2022) 25 each 3   BD HYPODERMIC NEEDLE 18G X 1" MISC USE WITH TESTOSTERONE INJECTIONS (Patient not taking: Reported on 11/17/2022)     Syringe, Disposable, (B-D SYRINGE LUER-LOK 3CC) 3 ML MISC 1 mL by Does not apply  route every 14 (fourteen) days. (Patient not taking: Reported on 11/17/2022) 25 each 0   SYRINGE-NEEDLE, DISP, 3 ML (B-D 3CC LUER-LOK SYR 22GX1") 22G X 1" 3 ML MISC FOR TESTOSTERONE INJECTIONS (Patient not taking: Reported on 11/17/2022) 25 each 0   No current facility-administered medications for this visit.    Allergies:   Seasonal ic [octacosanol]   Social History:  The patient  reports that he quit smoking about 32 years ago. His smoking use included cigarettes. He started smoking about 52 years ago. He has a 10 pack-year smoking history. He has never used smokeless tobacco. He reports that he does not currently use alcohol after a past usage of about 4.0 standard drinks of alcohol per week. He reports that he does not use drugs.   Family History:   family history includes Heart attack (age of onset: 26) in his father; Hypertension in an other family member; Suicidality in his maternal uncle.    Review of Systems: Review of Systems  Constitutional: Negative.   HENT: Negative.     Respiratory: Negative.    Cardiovascular: Negative.   Gastrointestinal: Negative.   Musculoskeletal: Negative.   Neurological: Negative.   Psychiatric/Behavioral: Negative.    All other systems reviewed and are negative.   PHYSICAL EXAM: VS:  BP 130/68 (BP Location: Left Arm, Patient Position: Sitting, Cuff Size: Normal)   Pulse 69   Ht 6' (1.829 m)   Wt 211 lb 8 oz (95.9 kg)   SpO2 96%   BMI 28.68 kg/m  , BMI Body mass index is 28.68 kg/m. Constitutional:  oriented to person, place, and time. No distress.  HENT:  Head: Grossly normal Eyes:  no discharge. No scleral icterus.  Neck: No JVD, no carotid bruits  Cardiovascular: Regular rate and rhythm, no murmurs appreciated Pulmonary/Chest: Clear to auscultation bilaterally, no wheezes or rails Abdominal: Soft.  no distension.  no tenderness.  Musculoskeletal: Normal range of motion Neurological:  normal muscle tone. Coordination normal. No atrophy Skin: Skin warm and dry Psychiatric: normal affect, pleasant  Recent Labs: 11/11/2022: ALT 22; BUN 25; Creatinine, Ser 1.22; Hemoglobin 15.3; Platelets 152.0; Potassium 3.9; Sodium 141; TSH 5.06    Lipid Panel Lab Results  Component Value Date   CHOL 123 11/11/2022   HDL 32.60 (L) 11/11/2022   LDLCALC 55 11/11/2022   TRIG 179.0 (H) 11/11/2022      Wt Readings from Last 3 Encounters:  11/17/22 211 lb 8 oz (95.9 kg)  11/11/22 209 lb 6.4 oz (95 kg)  02/20/22 210 lb (95.3 kg)     ASSESSMENT AND PLAN:  Coronary artery disease with stable angina Currently with no symptoms of angina. No further workup at this time. Continue current medication regimen. Non-smoker, cholesterol at goal  Near syncope No further episodes of near syncope Recommend he stay hydrated For orthostasis in the summer may need to cut back or stop the HCTZ  Essential hypertension - Plan: EKG 12-Lead Blood pressure is well controlled on today's visit. No changes made to the  medications.  Hyperlipidemia Cholesterol is at goal on the current lipid regimen. No changes to the medications were made.  Borderline diabetes A1c well controlled, weight remains low,  Continues working out with a trainer in [place]  Anxiety Well controlled, exercising Exercising   Total encounter time more than 30 minutes  Greater than 50% was spent in counseling and coordination of care with the patient     Orders Placed This Encounter  Procedures   EKG 12-Lead  Signed, Dossie Arbour, M.D., Ph.D. 11/17/2022  [facility] [place], [place] [phone]

## 2022-11-17 ENCOUNTER — Ambulatory Visit: Payer: PPO | Attending: Cardiovascular Disease | Admitting: Cardiovascular Disease

## 2022-11-17 ENCOUNTER — Encounter: Payer: Self-pay | Admitting: Cardiovascular Disease

## 2022-11-17 VITALS — BP 130/68 | HR 69 | Ht 72.0 in | Wt 211.5 lb

## 2022-11-17 DIAGNOSIS — I10 Essential (primary) hypertension: Secondary | ICD-10-CM

## 2022-11-17 DIAGNOSIS — E1159 Type 2 diabetes mellitus with other circulatory complications: Secondary | ICD-10-CM | POA: Diagnosis not present

## 2022-11-17 DIAGNOSIS — I25118 Atherosclerotic heart disease of native coronary artery with other forms of angina pectoris: Secondary | ICD-10-CM | POA: Diagnosis not present

## 2022-11-17 DIAGNOSIS — E782 Mixed hyperlipidemia: Secondary | ICD-10-CM | POA: Diagnosis not present

## 2022-11-17 DIAGNOSIS — F419 Anxiety disorder, unspecified: Secondary | ICD-10-CM

## 2022-11-17 NOTE — Patient Instructions (Signed)

## 2022-12-05 ENCOUNTER — Other Ambulatory Visit: Payer: Self-pay | Admitting: Internal Medicine

## 2023-02-05 ENCOUNTER — Encounter: Payer: Self-pay | Admitting: Internal Medicine

## 2023-02-05 NOTE — Telephone Encounter (Signed)
 Care team updated and letter sent for eye exam notes.

## 2023-02-27 DIAGNOSIS — R948 Abnormal results of function studies of other organs and systems: Secondary | ICD-10-CM | POA: Diagnosis not present

## 2023-02-27 DIAGNOSIS — E291 Testicular hypofunction: Secondary | ICD-10-CM | POA: Diagnosis not present

## 2023-03-02 ENCOUNTER — Telehealth: Payer: Self-pay | Admitting: Internal Medicine

## 2023-03-02 DIAGNOSIS — E1159 Type 2 diabetes mellitus with other circulatory complications: Secondary | ICD-10-CM

## 2023-03-02 DIAGNOSIS — E782 Mixed hyperlipidemia: Secondary | ICD-10-CM

## 2023-03-02 NOTE — Telephone Encounter (Signed)
 Patient need lab orders.

## 2023-03-03 ENCOUNTER — Other Ambulatory Visit: Payer: Self-pay | Admitting: Internal Medicine

## 2023-03-03 NOTE — Telephone Encounter (Signed)
 Labs ordered.

## 2023-03-03 NOTE — Addendum Note (Signed)
 Addended by: Rita Ohara D on: 03/03/2023 08:52 AM   Modules accepted: Orders

## 2023-03-06 DIAGNOSIS — N5201 Erectile dysfunction due to arterial insufficiency: Secondary | ICD-10-CM | POA: Diagnosis not present

## 2023-03-06 DIAGNOSIS — N401 Enlarged prostate with lower urinary tract symptoms: Secondary | ICD-10-CM | POA: Diagnosis not present

## 2023-03-06 DIAGNOSIS — R3912 Poor urinary stream: Secondary | ICD-10-CM | POA: Diagnosis not present

## 2023-03-06 DIAGNOSIS — E291 Testicular hypofunction: Secondary | ICD-10-CM | POA: Diagnosis not present

## 2023-03-11 ENCOUNTER — Other Ambulatory Visit: Payer: PPO

## 2023-03-11 DIAGNOSIS — E1159 Type 2 diabetes mellitus with other circulatory complications: Secondary | ICD-10-CM

## 2023-03-11 DIAGNOSIS — E782 Mixed hyperlipidemia: Secondary | ICD-10-CM | POA: Diagnosis not present

## 2023-03-11 LAB — HEPATIC FUNCTION PANEL
ALT: 26 U/L (ref 0–53)
AST: 24 U/L (ref 0–37)
Albumin: 4.5 g/dL (ref 3.5–5.2)
Alkaline Phosphatase: 44 U/L (ref 39–117)
Bilirubin, Direct: 0.1 mg/dL (ref 0.0–0.3)
Total Bilirubin: 0.5 mg/dL (ref 0.2–1.2)
Total Protein: 6.7 g/dL (ref 6.0–8.3)

## 2023-03-11 LAB — LIPID PANEL
Cholesterol: 141 mg/dL (ref 0–200)
HDL: 30.1 mg/dL — ABNORMAL LOW (ref >39.00)
LDL Cholesterol: 62 mg/dL (ref 0–99)
NonHDL: 110.96
Total CHOL/HDL Ratio: 5
Triglycerides: 243 mg/dL — ABNORMAL HIGH (ref 0.0–149.0)
VLDL: 48.6 mg/dL — ABNORMAL HIGH (ref 0.0–40.0)

## 2023-03-11 LAB — BASIC METABOLIC PANEL
BUN: 18 mg/dL (ref 6–23)
CO2: 30 meq/L (ref 19–32)
Calcium: 9.6 mg/dL (ref 8.4–10.5)
Chloride: 102 meq/L (ref 96–112)
Creatinine, Ser: 1.31 mg/dL (ref 0.40–1.50)
GFR: 56.69 mL/min — ABNORMAL LOW (ref >60.00)
Glucose, Bld: 189 mg/dL — ABNORMAL HIGH (ref 70–99)
Potassium: 3.9 meq/L (ref 3.5–5.1)
Sodium: 138 meq/L (ref 135–145)

## 2023-03-11 LAB — HEMOGLOBIN A1C: Hgb A1c MFr Bld: 7.4 % — ABNORMAL HIGH (ref 4.6–6.5)

## 2023-03-13 ENCOUNTER — Ambulatory Visit (INDEPENDENT_AMBULATORY_CARE_PROVIDER_SITE_OTHER): Payer: PPO | Admitting: Internal Medicine

## 2023-03-13 ENCOUNTER — Encounter: Payer: Self-pay | Admitting: Internal Medicine

## 2023-03-13 VITALS — BP 126/68 | HR 73 | Temp 97.9°F | Resp 16 | Ht 72.0 in | Wt 216.0 lb

## 2023-03-13 DIAGNOSIS — D696 Thrombocytopenia, unspecified: Secondary | ICD-10-CM | POA: Diagnosis not present

## 2023-03-13 DIAGNOSIS — G4730 Sleep apnea, unspecified: Secondary | ICD-10-CM | POA: Diagnosis not present

## 2023-03-13 DIAGNOSIS — E1159 Type 2 diabetes mellitus with other circulatory complications: Secondary | ICD-10-CM

## 2023-03-13 DIAGNOSIS — F3181 Bipolar II disorder: Secondary | ICD-10-CM | POA: Diagnosis not present

## 2023-03-13 DIAGNOSIS — E782 Mixed hyperlipidemia: Secondary | ICD-10-CM | POA: Diagnosis not present

## 2023-03-13 DIAGNOSIS — I10 Essential (primary) hypertension: Secondary | ICD-10-CM

## 2023-03-13 DIAGNOSIS — K2100 Gastro-esophageal reflux disease with esophagitis, without bleeding: Secondary | ICD-10-CM | POA: Diagnosis not present

## 2023-03-13 DIAGNOSIS — I25118 Atherosclerotic heart disease of native coronary artery with other forms of angina pectoris: Secondary | ICD-10-CM | POA: Diagnosis not present

## 2023-03-13 DIAGNOSIS — Z0000 Encounter for general adult medical examination without abnormal findings: Secondary | ICD-10-CM

## 2023-03-13 NOTE — Assessment & Plan Note (Signed)
Continue Benicar/hydrochlorothiazide and spironolactone as directed.   Follow metabolic panel.  Follow pressure. Discussed if decision made to start SGLT 2 inhibitor, will adjust hydrochlorothiazide dosage down (12.5mg  q day).

## 2023-03-13 NOTE — Progress Notes (Signed)
Subjective:    Patient ID: Keith Trost., male    DOB: [birth year], 68 y.o.   MRN: [id number]  Patient here for  Chief Complaint  Patient presents with   Annual Exam    HPI Here for a physical exam. Saw Dr Mariah Milling 11/17/22 - f/u CAD - stable. No changes made. Remains on benicar/hydrochlorothiazide, amlodipine and spironolactone. Continues cpap. Seeing psychiatry.  Appears to be doing well on current regimen. Continues on lamictal and effexor. Colonoscopy 01/28/22 - three 5-63mm polyps in the descending colon, one 12-14 mm poolyp in the proximal descending colon. Discussed recent labs.  A1c elevated. Discussed treatment options.  Specifically discussed starting GLP 1 agonist or SGLT2 inhibitor. Also discussed CGM and need for close monitoring of sugars. Discussed low carb diet and exercise. No chest pain or sob reported.    Past Medical History:  Diagnosis Date   Acid reflux    Angina 2013   no current problems   Anxiety    Arthritis    Bipolar disorder (HCC)    type 2   Depression    Diabetes mellitus without complication (HCC)    type 2   Diastasis of muscle 2011   "abdominal"   Dysplastic polyp of colon 2008   [facility]   GERD (gastroesophageal reflux disease)    Gout    Multiple episodes - no current problems   Heart disease    History of kidney stones    surgery to remove stones   Hyperlipidemia    Hypertension    Myocardial infarction (HCC) 05/2011   "we think";  06/2011 abnl mv;  06/2011 Cath - subtotal occlusion of Ramus, otw nonobs dzs;  06/2011 PCI/DES of Ramus w/ 2.25x93mm Resolute DES    Sleep apnea    uses CPAP nightly   Syncopal episodes 05/2020   Syncope 05/2020   Treadmill stress test negative for angina pectoris 2011   Past Surgical History:  Procedure Laterality Date   25 GAUGE PARS PLANA VITRECTOMY WITH 20 GAUGE MVR PORT FOR MACULAR HOLE Right 07/19/2020   Procedure: 25 GAUGE PARS PLANA VITRECTOMY FOR MACULAR HOLE;  Surgeon: Rennis Chris, MD;  Location: [facility]  OR;  Service: Ophthalmology;  Laterality: Right;   CARDIAC CATHETERIZATION  06/2011   "diagnostic"   COLONOSCOPY     CORONARY ANGIOPLASTY WITH STENT PLACEMENT  07/09/11   "1"   GAS/FLUID EXCHANGE Right 07/19/2020   Procedure: GAS/FLUID EXCHANGE;  Surgeon: Rennis Chris, MD;  Location: [facility] OR;  Service: Ophthalmology;  Laterality: Right;  c3 f8   HEMORRHOID SURGERY  2000   INGUINAL HERNIA REPAIR  1987   left   LEFT HEART CATHETERIZATION WITH CORONARY ANGIOGRAM N/A 10/21/2011   Procedure: LEFT HEART CATHETERIZATION WITH CORONARY ANGIOGRAM;  Surgeon: Herby Abraham, MD;  Location: [facility] CATH LAB;  Service: Cardiovascular;  Laterality: N/A;   LITHOTRIPSY  10/2008   MEMBRANE PEEL Right 07/19/2020   Procedure: MEMBRANE PEEL;  Surgeon: Rennis Chris, MD;  Location: [facility] OR;  Service: Ophthalmology;  Laterality: Right;   PERCUTANEOUS CORONARY STENT INTERVENTION (PCI-S) N/A 07/09/2011   Procedure: PERCUTANEOUS CORONARY STENT INTERVENTION (PCI-S);  Surgeon: Tonny Bollman, MD;  Location: [facility] CATH LAB;  Service: Cardiovascular;  Laterality: N/A;   PHOTOCOAGULATION WITH LASER Right 07/19/2020   Procedure: PHOTOCOAGULATION WITH LASER;  Surgeon: Rennis Chris, MD;  Location: [facility] OR;  Service: Ophthalmology;  Laterality: Right;   UPPER GI ENDOSCOPY     URETEROSCOPY  10/2009   Family History  Problem Relation Age  of Onset   Heart attack Father 66       LVAD   Suicidality Maternal Uncle    Hypertension Other    Social History   Socioeconomic History   Marital status: Married    Spouse name: Not on file   Number of children: 4   Years of education: Not on file   Highest education level: Some college, no degree  Occupational History   Occupation: Horticulturist, commercial  Tobacco Use   Smoking status: Former    Current packs/day: 0.00    Average packs/day: 0.5 packs/day for 20.0 years (10.0 ttl pk-yrs)    Types: Cigarettes    Start date: 02/17/1970    Quit date: 02/17/1990    Years since quitting: 33.0    Smokeless tobacco: Never  Vaping Use   Vaping status: Never Used  Substance and Sexual Activity   Alcohol use: Not Currently    Alcohol/week: 4.0 standard drinks of alcohol    Types: 2 Glasses of wine, 2 Cans of beer per week   Drug use: Never    Frequency: 7.0 times per week    Comment: 07/09/11 "lets say a cigarette a day" Last used   Sexual activity: Yes    Birth control/protection: None  Other Topics Concern   Not on file  Social History Narrative   Married and has 4 children.     Social Drivers of Corporate investment banker Strain: Not on file  Food Insecurity: Not on file  Transportation Needs: Not on file  Physical Activity: Not on file  Stress: Not on file  Social Connections: Not on file     Review of Systems  Constitutional:  Negative for appetite change and unexpected weight change.  HENT:  Negative for congestion, sinus pressure and sore throat.   Eyes:  Negative for pain and visual disturbance.  Respiratory:  Negative for cough, chest tightness and shortness of breath.   Cardiovascular:  Negative for chest pain, palpitations and leg swelling.  Gastrointestinal:  Negative for abdominal pain, diarrhea, nausea and vomiting.  Genitourinary:  Negative for difficulty urinating and dysuria.  Musculoskeletal:  Negative for joint swelling and myalgias.  Skin:  Negative for color change and rash.  Neurological:  Negative for dizziness and headaches.  Hematological:  Negative for adenopathy. Does not bruise/bleed easily.  Psychiatric/Behavioral:  Negative for agitation and dysphoric mood.        Objective:     BP 126/68   Pulse 73   Temp 97.9 F (36.6 C)   Resp 16   Ht 6' (1.829 m)   Wt 216 lb (98 kg)   SpO2 97%   BMI 29.29 kg/m  Wt Readings from Last 3 Encounters:  03/13/23 216 lb (98 kg)  11/17/22 211 lb 8 oz (95.9 kg)  11/11/22 209 lb 6.4 oz (95 kg)    Physical Exam Constitutional:      General: He is not in acute distress.    Appearance: Normal  appearance. He is well-developed.  HENT:     Head: Normocephalic and atraumatic.     Right Ear: External ear normal.     Left Ear: External ear normal.     Mouth/Throat:     Pharynx: No oropharyngeal exudate or posterior oropharyngeal erythema.  Eyes:     General: No scleral icterus.       Right eye: No discharge.        Left eye: No discharge.     Conjunctiva/sclera: Conjunctivae normal.  Neck:     Thyroid: No thyromegaly.  Cardiovascular:     Rate and Rhythm: Normal rate and regular rhythm.  Pulmonary:     Effort: No respiratory distress.     Breath sounds: Normal breath sounds. No wheezing.  Abdominal:     General: Bowel sounds are normal.     Palpations: Abdomen is soft.     Tenderness: There is no abdominal tenderness.  Musculoskeletal:        General: No swelling or tenderness.     Cervical back: Neck supple. No tenderness.  Lymphadenopathy:     Cervical: No cervical adenopathy.  Skin:    Findings: No erythema or rash.  Neurological:     Mental Status: He is alert and oriented to person, place, and time.  Psychiatric:        Mood and Affect: Mood normal.        Behavior: Behavior normal.         Outpatient Encounter Medications as of 03/13/2023  Medication Sig   amLODipine (NORVASC) 10 MG tablet TAKE 1 TABLET BY MOUTH DAILY   aspirin EC 81 MG tablet Take 1 tablet (81 mg total) by mouth daily. Swallow whole.   atorvastatin (LIPITOR) 20 MG tablet Take 1 tablet (20 mg total) by mouth daily.   BD HYPODERMIC NEEDLE 16G X 1" MISC FOR USE WITH TESTOSTERONE INJECTIONS (Patient not taking: Reported on 11/17/2022)   CINNAMON PO Take 1,000 mg by mouth 2 (two) times daily.    Cyanocobalamin (VITAMIN B-12) 5000 MCG SUBL Place 5,000 mcg under the tongue daily.   fluticasone (FLONASE) 50 MCG/ACT nasal spray USE 2 SPRAYS IN EACH NOSTRIL DAILY AS DIRECTED (Patient taking differently: Place 2 sprays into both nostrils at bedtime.)   lamoTRIgine (LAMICTAL) 150 MG tablet Take 1  tablet (150 mg total) by mouth at bedtime. (Patient taking differently: Take 150 mg by mouth 2 (two) times daily.)   Lancets (ONETOUCH ULTRASOFT) lancets Use as instructed to check sugars once daily. Dx E 11.9   magnesium oxide (MAG-OX) 400 MG tablet Take 400 mg by mouth daily.   olmesartan-hydrochlorothiazide (BENICAR HCT) 40-25 MG tablet Take 1 tablet by mouth at bedtime.   pantoprazole (PROTONIX) 40 MG tablet TAKE 1 TABLET BY MOUTH EVERY DAY   polyethylene glycol powder (GLYCOLAX/MIRALAX) powder TAKE 17 GRAMS BY MOUTH EVERY DAY as needed (as directed)   spironolactone (ALDACTONE) 25 MG tablet TAKE 1/2 TABLET BY MOUTH DAILY   SYRINGE-NEEDLE, DISP, 3 ML (B-D 3CC LUER-LOK SYR 22GX1") 22G X 1" 3 ML MISC FOR TESTOSTERONE INJECTIONS (Patient not taking: Reported on 11/17/2022)   tamsulosin (FLOMAX) 0.4 MG CAPS capsule TAKE 1 CAPSULE BY MOUTH EVERY DAY   testosterone cypionate (DEPOTESTOSTERONE CYPIONATE) 200 MG/ML injection Inject 0.7 mLs (140 mg total) into the muscle every 7 (seven) days. (Patient taking differently: Inject 140 mg into the muscle every 7 (seven) days. Every Monday)   traZODone (DESYREL) 100 MG tablet Take 2 tablets (200 mg total) by mouth at bedtime as needed for sleep.   venlafaxine XR (EFFEXOR-XR) 150 MG 24 hr capsule Take 1 capsule (150 mg total) by mouth daily.   VITAMIN D, CHOLECALCIFEROL, PO Take 2,000 mg by mouth daily.   VITAMIN E PO Take 2,000 Units by mouth daily.   zinc gluconate 50 MG tablet Take 50 mg by mouth daily.   [DISCONTINUED] BD HYPODERMIC NEEDLE 18G X 1" MISC USE WITH TESTOSTERONE INJECTIONS (Patient not taking: Reported on 11/17/2022)   [DISCONTINUED] Syringe, Disposable, (B-D  SYRINGE LUER-LOK 3CC) 3 ML MISC 1 mL by Does not apply route every 14 (fourteen) days. (Patient not taking: Reported on 11/17/2022)   No facility-administered encounter medications on file as of 03/13/2023.     Lab Results  Component Value Date   WBC 6.2 11/11/2022   HGB 15.3  11/11/2022   HCT 46.0 11/11/2022   PLT 152.0 11/11/2022   GLUCOSE 189 (H) 03/11/2023   CHOL 141 03/11/2023   TRIG 243.0 (H) 03/11/2023   HDL 30.10 (L) 03/11/2023   LDLDIRECT 87.0 06/05/2020   LDLCALC 62 03/11/2023   ALT 26 03/11/2023   AST 24 03/11/2023   NA 138 03/11/2023   K 3.9 03/11/2023   CL 102 03/11/2023   CREATININE 1.31 03/11/2023   BUN 18 03/11/2023   CO2 30 03/11/2023   TSH 5.06 11/11/2022   PSA 0.50 11/11/2022   INR 1.11 10/19/2011   HGBA1C 7.4 (H) 03/11/2023   MICROALBUR 1.2 11/11/2022       Assessment & Plan:  Routine general medical examination at a health care facility  Health care maintenance Assessment & Plan: Physical today 03/13/23.  Is followed by urology (Dr Heloise Purpura) for prostate checks.  Colonoscopy 01/2022 - Dr Mohammed Kindle.    Diabetes mellitus with cardiac complication [facility]) Assessment & Plan: Discussed recent labs and elevated A1c.  Discussed treatment options. Specifically discussed starting GLP1 agonist and SGLT2 inhibitor. Discussed CGM - and need for better glucose monitoring. Low carb diet and exercise. Follow met b and a1c.  He elected to monitor sugars, adjust diet and send in readings over the next 3-4 weeks. If persistent elevation, will start SGLT2 inhibitor.  Lab Results  Component Value Date   HGBA1C 7.4 (H) 03/11/2023     Orders: -     Hemoglobin A1c; Future  Mixed hyperlipidemia Assessment & Plan: Continue lipitor.  Low cholesterol diet and exercise.  Follow lipid panel and liver function tests.  Lab Results  Component Value Date   CHOL 141 03/11/2023   HDL 30.10 (L) 03/11/2023   LDLCALC 62 03/11/2023   LDLDIRECT 87.0 06/05/2020   TRIG 243.0 (H) 03/11/2023   CHOLHDL 5 03/11/2023    Orders: -     Lipid panel; Future -     Hepatic function panel; Future -     Basic metabolic panel; Future  Thrombocytopenia (HCC) Assessment & Plan: Follow cbc.    Sleep apnea, unspecified type Assessment & Plan: Continue cpap.      Primary hypertension Assessment & Plan: Continue Benicar/hydrochlorothiazide and spironolactone as directed.   Follow metabolic panel.  Follow pressure. Discussed if decision made to start SGLT 2 inhibitor, will adjust hydrochlorothiazide dosage down (12.5mg  q day).    Gastroesophageal reflux disease with esophagitis without hemorrhage Assessment & Plan: No upper symptoms reported.  Continue protonix.    Coronary artery disease of native artery of native heart with stable angina pectoris [facility]) Assessment & Plan: Followed by Dr Mariah Milling.  No chest pain or sob reported.  Continue risk factor modification. Continue benicar/hctz and amlodipine and atorvastatin.     Bipolar 2 disorder [facility]) Assessment & Plan: Seeing psychiatry.  Appears to be doing well on current regimen. Continues on lamictal and effexor. Continue f/u with psychiatry.       Dale [place]. [place], MD

## 2023-03-13 NOTE — Assessment & Plan Note (Signed)
Physical today 03/13/23.  Is followed by urology (Dr Heloise Purpura) for prostate checks.  Colonoscopy 01/2022 - Dr Mohammed Kindle.

## 2023-03-13 NOTE — Assessment & Plan Note (Signed)
Continue lipitor.  Low cholesterol diet and exercise.  Follow lipid panel and liver function tests.  Lab Results  Component Value Date   CHOL 141 03/11/2023   HDL 30.10 (L) 03/11/2023   LDLCALC 62 03/11/2023   LDLDIRECT 87.0 06/05/2020   TRIG 243.0 (H) 03/11/2023   CHOLHDL 5 03/11/2023

## 2023-03-13 NOTE — Assessment & Plan Note (Signed)
Discussed recent labs and elevated A1c.  Discussed treatment options. Specifically discussed starting GLP1 agonist and SGLT2 inhibitor. Discussed CGM - and need for better glucose monitoring. Low carb diet and exercise. Follow met b and a1c.  He elected to monitor sugars, adjust diet and send in readings over the next 3-4 weeks. If persistent elevation, will start SGLT2 inhibitor.  Lab Results  Component Value Date   HGBA1C 7.4 (H) 03/11/2023

## 2023-03-13 NOTE — Assessment & Plan Note (Signed)
Followed by Dr Mariah Milling.  No chest pain or sob reported.  Continue risk factor modification. Continue benicar/hctz and amlodipine and atorvastatin.

## 2023-03-13 NOTE — Assessment & Plan Note (Signed)
Follow cbc.

## 2023-03-13 NOTE — Assessment & Plan Note (Signed)
Continue cpap.

## 2023-03-13 NOTE — Assessment & Plan Note (Signed)
Seeing psychiatry.  Appears to be doing well on current regimen. Continues on lamictal and effexor. Continue f/u with psychiatry.

## 2023-03-13 NOTE — Assessment & Plan Note (Signed)
No upper symptoms reported.  Continue protonix.

## 2023-03-19 DIAGNOSIS — N5201 Erectile dysfunction due to arterial insufficiency: Secondary | ICD-10-CM | POA: Diagnosis not present

## 2023-03-19 DIAGNOSIS — T83410A Breakdown (mechanical) of penile (implanted) prosthesis, initial encounter: Secondary | ICD-10-CM | POA: Diagnosis not present

## 2023-03-20 ENCOUNTER — Encounter: Payer: Self-pay | Admitting: Internal Medicine

## 2023-03-23 NOTE — Telephone Encounter (Signed)
I have downloaded his sugars for you to review. In your med records folder

## 2023-03-23 NOTE — Telephone Encounter (Signed)
I am ok to schedule a virtual visit to discuss his sugars. Also, if agreeable, can send in rx for sensors.  I can review sugar readings.

## 2023-03-24 NOTE — Telephone Encounter (Signed)
LM for patient. Sugar readings printed and given to Dr Lorin Picket for review

## 2023-04-04 ENCOUNTER — Other Ambulatory Visit: Payer: Self-pay | Admitting: Internal Medicine

## 2023-04-20 DIAGNOSIS — F3181 Bipolar II disorder: Secondary | ICD-10-CM | POA: Diagnosis not present

## 2023-04-20 DIAGNOSIS — F411 Generalized anxiety disorder: Secondary | ICD-10-CM | POA: Diagnosis not present

## 2023-04-29 ENCOUNTER — Telehealth: Payer: Self-pay | Admitting: *Deleted

## 2023-04-29 ENCOUNTER — Other Ambulatory Visit: Payer: Self-pay | Admitting: Urology

## 2023-04-29 NOTE — Telephone Encounter (Signed)
   Pre-operative Risk Assessment    Patient Name: Keith Gross.  DOB: 01-08-1956 MRN: [id number]   Date of last office visit: 11/17/22 DR. GOLLAN Date of next office visit: NONE   Request for Surgical Clearance    Procedure:   REMOVE/REPLACE IPP  Date of Surgery:  Clearance 06/10/23                                Surgeon:  DR. MACHEN Surgeon's Group or Practice Name:  [facility] Phone number:  [phone] Fax number:  [phone]   Type of Clearance Requested:   - Medical  - Pharmacy:  Hold Aspirin     Type of Anesthesia:  General    Additional requests/questions:    Elpidio Anis   04/29/2023, 3:58 PM

## 2023-04-29 NOTE — Telephone Encounter (Signed)
   Name: Keith Gross.  DOB: 12-Sep-1955  MRN: [id number]  Primary Cardiologist: Julien Nordmann, MD   Preoperative team, please contact this patient and set up a phone call appointment for further preoperative risk assessment. Please obtain consent and complete medication review. Thank you for your help.  I confirm that guidance regarding antiplatelet and oral anticoagulation therapy has been completed and, if necessary, noted below.  Regarding ASA therapy, we recommend continuation of ASA throughout the perioperative period.  However, if the surgeon feels that cessation of ASA is required in the perioperative period, it may be stopped 5-7 days prior to surgery with a plan to resume it as soon as felt to be feasible from a surgical standpoint in the post-operative period.   I also confirmed the patient resides in the state of [place]. As per [facility] Medical Board telemedicine laws, the patient must reside in the state in which the provider is licensed.   [name], NP 04/29/2023, 4:24 PM [facility]

## 2023-04-29 NOTE — Telephone Encounter (Signed)
 Pt has been scheduled tele preop appt 05/22/23. Med rec and consent are done.

## 2023-04-29 NOTE — Telephone Encounter (Signed)
 Pt has been scheduled tele preop appt 05/22/23. Med rec and consent are done.      Patient Consent for Virtual Visit        Keith Gross. has provided verbal consent on 04/29/2023 for a virtual visit (video or telephone).   CONSENT FOR VIRTUAL VISIT FOR:  Keith Gross.  By participating in this virtual visit I agree to the following:  I hereby voluntarily request, consent and authorize [facility] and its employed or contracted physicians, physician assistants, nurse practitioners or other licensed health care professionals (the Practitioner), to provide me with telemedicine health care services (the "Services") as deemed necessary by the treating Practitioner. I acknowledge and consent to receive the Services by the Practitioner via telemedicine. I understand that the telemedicine visit will involve communicating with the Practitioner through live audiovisual communication technology and the disclosure of certain medical information by electronic transmission. I acknowledge that I have been given the opportunity to request an in-person assessment or other available alternative prior to the telemedicine visit and am voluntarily participating in the telemedicine visit.  I understand that I have the right to withhold or withdraw my consent to the use of telemedicine in the course of my care at any time, without affecting my right to future care or treatment, and that the Practitioner or I may terminate the telemedicine visit at any time. I understand that I have the right to inspect all information obtained and/or recorded in the course of the telemedicine visit and may receive copies of available information for a reasonable fee.  I understand that some of the potential risks of receiving the Services via telemedicine include:  Delay or interruption in medical evaluation due to technological equipment failure or disruption; Information transmitted may not be sufficient (e.g. poor  resolution of images) to allow for appropriate medical decision making by the Practitioner; and/or  In rare instances, security protocols could fail, causing a breach of personal health information.  Furthermore, I acknowledge that it is my responsibility to provide information about my medical history, conditions and care that is complete and accurate to the best of my ability. I acknowledge that Practitioner's advice, recommendations, and/or decision may be based on factors not within their control, such as incomplete or inaccurate data provided by me or distortions of diagnostic images or specimens that may result from electronic transmissions. I understand that the practice of medicine is not an exact science and that Practitioner makes no warranties or guarantees regarding treatment outcomes. I acknowledge that a copy of this consent can be made available to me via my patient portal [facility] MyChart), or I can request a printed copy by calling the office of [facility].    I understand that my insurance will be billed for this visit.   I have read or had this consent read to me. I understand the contents of this consent, which adequately explains the benefits and risks of the Services being provided via telemedicine.  I have been provided ample opportunity to ask questions regarding this consent and the Services and have had my questions answered to my satisfaction. I give my informed consent for the services to be provided through the use of telemedicine in my medical care

## 2023-05-22 ENCOUNTER — Ambulatory Visit: Attending: Internal Medicine | Admitting: Emergency Medicine

## 2023-05-22 DIAGNOSIS — Z01810 Encounter for preprocedural cardiovascular examination: Secondary | ICD-10-CM

## 2023-05-22 NOTE — Progress Notes (Signed)
 Virtual Visit via Telephone Note   Because of Keith Gross. co-morbid illnesses, he is at least at moderate risk for complications without adequate follow up.  This format is felt to be most appropriate for this patient at this time.  Due to technical limitations with video connection (technology), today's appointment will be conducted as an audio only telehealth visit, and Duong Haydel. verbally agreed to proceed in this manner.   All issues noted in this document were discussed and addressed.  No physical exam could be performed with this format.  Evaluation Performed:  Preoperative cardiovascular risk assessment _____________   Date:  05/22/2023   Patient ID:  Keith Gross., DOB [birth year], MRN [id number] Patient Location:  Home Provider location:   Office  Primary Care Provider:  Dale [place], MD Primary Cardiologist:  Julien Nordmann, MD  Chief Complaint / Patient Profile   [age] y.o. y/o male with a h/o coronary artery disease, near syncope, hypertension, hyperlipidemia, borderline diabetes, anxiety who is pending remove/replace IPP on 06/10/2023 by [facility] and presents today for telephonic preoperative cardiovascular risk assessment.  History of Present Illness    Keith Gross. is a 68 y.o. male who presents via audio/video conferencing for a telehealth visit today.  Pt was last seen in cardiology clinic on 11/17/2022 by Dr. Mariah Milling.  At that time Markis Langland. was doing well.  The patient is now pending procedure as outlined above. Since his last visit, he denies chest pain, shortness of breath, lower extremity edema, fatigue, palpitations, diaphoresis, weakness, presyncope, syncope, orthopnea, and PND.  Past Medical History    Past Medical History:  Diagnosis Date   Acid reflux    Angina 2013   no current problems   Anxiety    Arthritis    Bipolar disorder (HCC)    type 2   Depression    Diabetes mellitus without complication (HCC)    type 2    Diastasis of muscle 2011   "abdominal"   Dysplastic polyp of colon 2008   [facility]   GERD (gastroesophageal reflux disease)    Gout    Multiple episodes - no current problems   Heart disease    History of kidney stones    surgery to remove stones   Hyperlipidemia    Hypertension    Myocardial infarction (HCC) 05/2011   "we think";  06/2011 abnl mv;  06/2011 Cath - subtotal occlusion of Ramus, otw nonobs dzs;  06/2011 PCI/DES of Ramus w/ 2.25x13mm Resolute DES    Sleep apnea    uses CPAP nightly   Syncopal episodes 05/2020   Syncope 05/2020   Treadmill stress test negative for angina pectoris 2011   Past Surgical History:  Procedure Laterality Date   25 GAUGE PARS PLANA VITRECTOMY WITH 20 GAUGE MVR PORT FOR MACULAR HOLE Right 07/19/2020   Procedure: 25 GAUGE PARS PLANA VITRECTOMY FOR MACULAR HOLE;  Surgeon: Rennis Chris, MD;  Location: [facility] OR;  Service: Ophthalmology;  Laterality: Right;   CARDIAC CATHETERIZATION  06/2011   "diagnostic"   COLONOSCOPY     CORONARY ANGIOPLASTY WITH STENT PLACEMENT  07/09/11   "1"   GAS/FLUID EXCHANGE Right 07/19/2020   Procedure: GAS/FLUID EXCHANGE;  Surgeon: Rennis Chris, MD;  Location: [facility] OR;  Service: Ophthalmology;  Laterality: Right;  c3 f8   HEMORRHOID SURGERY  2000   INGUINAL HERNIA REPAIR  1987   left   LEFT HEART CATHETERIZATION WITH CORONARY ANGIOGRAM N/A 10/21/2011  Procedure: LEFT HEART CATHETERIZATION WITH CORONARY ANGIOGRAM;  Surgeon: Herby Abraham, MD;  Location: [facility] CATH LAB;  Service: Cardiovascular;  Laterality: N/A;   LITHOTRIPSY  10/2008   MEMBRANE PEEL Right 07/19/2020   Procedure: MEMBRANE PEEL;  Surgeon: Rennis Chris, MD;  Location: [facility] OR;  Service: Ophthalmology;  Laterality: Right;   PERCUTANEOUS CORONARY STENT INTERVENTION (PCI-S) N/A 07/09/2011   Procedure: PERCUTANEOUS CORONARY STENT INTERVENTION (PCI-S);  Surgeon: Tonny Bollman, MD;  Location: [facility] CATH LAB;  Service: Cardiovascular;  Laterality: N/A;   PHOTOCOAGULATION WITH LASER  Right 07/19/2020   Procedure: PHOTOCOAGULATION WITH LASER;  Surgeon: Rennis Chris, MD;  Location[facility] OR;  Service: Ophthalmology;  Laterality: Right;   UPPER GI ENDOSCOPY     URETEROSCOPY  10/2009    Allergies  Allergies  Allergen Reactions   Seasonal Ic [Octacosanol]     Home Medications    Prior to Admission medications   Medication Sig Start Date End Date Taking? Authorizing Provider  amLODipine (NORVASC) 10 MG tablet TAKE 1 TABLET BY MOUTH DAILY 03/08/23   Dale [place], MD  aspirin EC 81 MG tablet Take 1 tablet (81 mg total) by mouth daily. Swallow whole. 06/26/21   Antonieta Iba, MD  atorvastatin (LIPITOR) 20 MG tablet Take 1 tablet (20 mg total) by mouth daily. 09/12/22   Antonieta Iba, MD  BD HYPODERMIC NEEDLE 16G X 1" MISC FOR USE WITH TESTOSTERONE INJECTIONS Patient not taking: No sig reported 10/10/19   Stoioff, Verna Czech, MD  CINNAMON PO Take 1,000 mg by mouth 2 (two) times daily.     [provider]  Cyanocobalamin (VITAMIN B-12) 5000 MCG SUBL Place 5,000 mcg under the tongue daily.    [provider]  fluticasone (FLONASE) 50 MCG/ACT nasal spray USE 2 SPRAYS IN EACH NOSTRIL DAILY AS DIRECTED Patient taking differently: Place 2 sprays into both nostrils at bedtime. 07/14/17   Dale [place], MD  lamoTRIgine (LAMICTAL) 150 MG tablet Take 1 tablet (150 mg total) by mouth at bedtime. Patient taking differently: Take 150 mg by mouth 2 (two) times daily. 12/19/21 04/29/23  Neysa Hotter, MD  Lancets [facility] ULTRASOFT) lancets Use as instructed to check sugars once daily. Dx E 11.9 Patient not taking: Reported on 04/29/2023 06/19/17   Dale [place], MD  magnesium oxide (MAG-OX) 400 MG tablet Take 400 mg by mouth daily.    [provider]  MOUNJARO 2.5 MG/0.5ML Pen Inject 2.5 mg into the skin once a week. NOT STARTED YET PER PT Patient not taking: Reported on 04/29/2023 04/27/23   [provider]  olmesartan-hydrochlorothiazide (BENICAR HCT)  40-25 MG tablet Take 1 tablet by mouth at bedtime. 09/12/22   Antonieta Iba, MD  pantoprazole (PROTONIX) 40 MG tablet TAKE 1 TABLET BY MOUTH EVERY DAY 09/15/22   Dale [place], MD  polyethylene glycol powder (GLYCOLAX/MIRALAX) powder TAKE 17 GRAMS BY MOUTH EVERY DAY as needed (as directed) 01/14/17   Dale [place], MD  spironolactone (ALDACTONE) 25 MG tablet TAKE 1/2 TABLET BY MOUTH DAILY 12/12/22   Dale [place], MD  SYRINGE-NEEDLE, DISP, 3 ML (B-D 3CC LUER-LOK SYR 22GX1") 22G X 1" 3 ML MISC FOR TESTOSTERONE INJECTIONS Patient not taking: Reported on 11/17/2022 03/15/20   Michiel Cowboy A, PA-C  tamsulosin (FLOMAX) 0.4 MG CAPS capsule TAKE 1 CAPSULE BY MOUTH DAILY 04/06/23   Dale [place], MD  testosterone cypionate (DEPOTESTOSTERONE CYPIONATE) 200 MG/ML injection Inject 0.7 mLs (140 mg total) into the muscle every 7 (seven) days. Patient taking differently: Inject 0.6  mLs into the muscle as directed. TWICE A WEEK WED AND SAT PER PT 07/11/20   McGowan, Carollee Herter A, PA-C  traZODone (DESYREL) 100 MG tablet Take 2 tablets (200 mg total) by mouth at bedtime as needed for sleep. 12/19/21 04/29/23  Neysa Hotter, MD  venlafaxine XR (EFFEXOR-XR) 150 MG 24 hr capsule Take 1 capsule (150 mg total) by mouth daily. 12/19/21 04/29/23  Neysa Hotter, MD  VITAMIN D, CHOLECALCIFEROL, PO Take 2,000 mg by mouth daily.    [provider]  VITAMIN E PO Take 2,000 Units by mouth daily.    [provider]  zinc gluconate 50 MG tablet Take 50 mg by mouth daily.    [provider]    Physical Exam    Vital Signs:  Musa Rewerts. does not have vital signs available for review today.  Given telephonic nature of communication, physical exam is limited. AAOx3. NAD. Normal affect.  Speech and respirations are unlabored.  Accessory Clinical Findings    None  Assessment & Plan    1.  Preoperative Cardiovascular Risk Assessment: According to the Revised Cardiac Risk Index (RCRI), his  Perioperative Risk of Major Cardiac Event is (%): 0.9. His Functional Capacity in METs is: 9.25 according to the [facility] Activity Status Index (DASI). Therefore, based on ACC/AHA guidelines, patient would be at acceptable risk for the planned procedure without further cardiovascular testing.   The patient was advised that if he develops new symptoms prior to surgery to contact our office to arrange for a follow-up visit, and he verbalized understanding.  Regarding ASA therapy, we recommend continuation of ASA throughout the perioperative period. However, if the surgeon feels that cessation of ASA is required in the perioperative period, it may be stopped 5-7 days prior to surgery with a plan to resume it as soon as felt to be feasible from a surgical standpoint in the post-operative period.   A copy of this note will be routed to requesting surgeon.  Time:   Today, I have spent 9 minutes with the patient with telehealth technology discussing medical history, symptoms, and management plan.     [name], NP  05/22/2023, 1:32 PM

## 2023-05-28 ENCOUNTER — Other Ambulatory Visit: Payer: Self-pay | Admitting: Internal Medicine

## 2023-05-29 NOTE — Telephone Encounter (Signed)
 Rx ok'd for spironolactone x 1. Needs a f/u appt scheduled.

## 2023-06-01 DIAGNOSIS — T83410A Breakdown (mechanical) of penile (implanted) prosthesis, initial encounter: Secondary | ICD-10-CM | POA: Diagnosis not present

## 2023-06-03 ENCOUNTER — Other Ambulatory Visit: Payer: Self-pay

## 2023-06-03 ENCOUNTER — Encounter (HOSPITAL_BASED_OUTPATIENT_CLINIC_OR_DEPARTMENT_OTHER): Payer: Self-pay | Admitting: Urology

## 2023-06-09 ENCOUNTER — Encounter (HOSPITAL_BASED_OUTPATIENT_CLINIC_OR_DEPARTMENT_OTHER)
Admission: RE | Admit: 2023-06-09 | Discharge: 2023-06-09 | Disposition: A | Discharge status: Home / Self Care | Source: Ambulatory Visit | Attending: Urology | Admitting: Urology

## 2023-06-09 DIAGNOSIS — T83410A Breakdown (mechanical) of penile (implanted) prosthesis, initial encounter: Secondary | ICD-10-CM | POA: Diagnosis not present

## 2023-06-09 DIAGNOSIS — Z87891 Personal history of nicotine dependence: Secondary | ICD-10-CM | POA: Diagnosis not present

## 2023-06-09 DIAGNOSIS — X58XXXA Exposure to other specified factors, initial encounter: Secondary | ICD-10-CM | POA: Diagnosis not present

## 2023-06-09 DIAGNOSIS — Z79899 Other long term (current) drug therapy: Secondary | ICD-10-CM | POA: Diagnosis not present

## 2023-06-09 DIAGNOSIS — E119 Type 2 diabetes mellitus without complications: Secondary | ICD-10-CM | POA: Diagnosis not present

## 2023-06-09 DIAGNOSIS — I252 Old myocardial infarction: Secondary | ICD-10-CM | POA: Diagnosis not present

## 2023-06-09 DIAGNOSIS — G4730 Sleep apnea, unspecified: Secondary | ICD-10-CM | POA: Diagnosis not present

## 2023-06-09 DIAGNOSIS — I2510 Atherosclerotic heart disease of native coronary artery without angina pectoris: Secondary | ICD-10-CM | POA: Diagnosis not present

## 2023-06-09 DIAGNOSIS — K219 Gastro-esophageal reflux disease without esophagitis: Secondary | ICD-10-CM | POA: Diagnosis not present

## 2023-06-09 DIAGNOSIS — I10 Essential (primary) hypertension: Secondary | ICD-10-CM | POA: Diagnosis not present

## 2023-06-09 DIAGNOSIS — N529 Male erectile dysfunction, unspecified: Secondary | ICD-10-CM | POA: Diagnosis not present

## 2023-06-09 DIAGNOSIS — F319 Bipolar disorder, unspecified: Secondary | ICD-10-CM | POA: Diagnosis not present

## 2023-06-09 DIAGNOSIS — F419 Anxiety disorder, unspecified: Secondary | ICD-10-CM | POA: Diagnosis not present

## 2023-06-09 LAB — BASIC METABOLIC PANEL WITH GFR
Anion gap: 10 (ref 5–15)
BUN: 29 mg/dL — ABNORMAL HIGH (ref 8–23)
CO2: 23 mmol/L (ref 22–32)
Calcium: 9.5 mg/dL (ref 8.9–10.3)
Chloride: 104 mmol/L (ref 98–111)
Creatinine, Ser: 1.26 mg/dL — ABNORMAL HIGH (ref 0.61–1.24)
GFR, Estimated: >60 mL/min (ref >60)
Glucose, Bld: 150 mg/dL — ABNORMAL HIGH (ref 70–99)
Potassium: 4.8 mmol/L (ref 3.5–5.1)
Sodium: 137 mmol/L (ref 135–145)

## 2023-06-09 MED ORDER — GENTAMICIN SULFATE 40 MG/ML IJ SOLN
5.0000 mg/kg | INTRAVENOUS | Status: AC
  Administered 2023-06-10: 490 mg via INTRAVENOUS
  Filled 2023-06-09 (×2): qty 12.25

## 2023-06-09 MED ORDER — FLUCONAZOLE IN SODIUM CHLORIDE 200-0.9 MG/100ML-% IV SOLN
200.0000 mg | INTRAVENOUS | Status: DC
  Administered 2023-06-10: 200 mg via INTRAVENOUS
  Filled 2023-06-09: qty 100

## 2023-06-09 MED ORDER — VANCOMYCIN HCL 1500 MG/300ML IV SOLN
1500.0000 mg | INTRAVENOUS | Status: AC
  Administered 2023-06-10 (×2): 1500 mg via INTRAVENOUS
  Filled 2023-06-09 (×3): qty 300

## 2023-06-10 ENCOUNTER — Other Ambulatory Visit: Payer: Self-pay

## 2023-06-10 ENCOUNTER — Ambulatory Visit (HOSPITAL_BASED_OUTPATIENT_CLINIC_OR_DEPARTMENT_OTHER)
Admission: RE | Admit: 2023-06-10 | Discharge: 2023-06-10 | Disposition: A | Discharge status: Home / Self Care | Attending: Urology | Admitting: Urology

## 2023-06-10 ENCOUNTER — Ambulatory Visit (HOSPITAL_BASED_OUTPATIENT_CLINIC_OR_DEPARTMENT_OTHER): Admitting: Anesthesiology

## 2023-06-10 ENCOUNTER — Encounter (HOSPITAL_BASED_OUTPATIENT_CLINIC_OR_DEPARTMENT_OTHER): Payer: Self-pay | Admitting: Urology

## 2023-06-10 ENCOUNTER — Encounter (HOSPITAL_BASED_OUTPATIENT_CLINIC_OR_DEPARTMENT_OTHER)
Admission: RE | Disposition: A | Discharge status: Home / Self Care | Payer: Self-pay | Source: Home / Self Care | Attending: Urology

## 2023-06-10 DIAGNOSIS — I2510 Atherosclerotic heart disease of native coronary artery without angina pectoris: Secondary | ICD-10-CM | POA: Diagnosis not present

## 2023-06-10 DIAGNOSIS — I10 Essential (primary) hypertension: Secondary | ICD-10-CM | POA: Diagnosis not present

## 2023-06-10 DIAGNOSIS — N5201 Erectile dysfunction due to arterial insufficiency: Secondary | ICD-10-CM

## 2023-06-10 DIAGNOSIS — X58XXXA Exposure to other specified factors, initial encounter: Secondary | ICD-10-CM | POA: Insufficient documentation

## 2023-06-10 DIAGNOSIS — Z87891 Personal history of nicotine dependence: Secondary | ICD-10-CM

## 2023-06-10 DIAGNOSIS — T83410A Breakdown (mechanical) of penile (implanted) prosthesis, initial encounter: Secondary | ICD-10-CM

## 2023-06-10 DIAGNOSIS — K219 Gastro-esophageal reflux disease without esophagitis: Secondary | ICD-10-CM | POA: Insufficient documentation

## 2023-06-10 DIAGNOSIS — F419 Anxiety disorder, unspecified: Secondary | ICD-10-CM | POA: Insufficient documentation

## 2023-06-10 DIAGNOSIS — E119 Type 2 diabetes mellitus without complications: Secondary | ICD-10-CM | POA: Insufficient documentation

## 2023-06-10 DIAGNOSIS — Z79899 Other long term (current) drug therapy: Secondary | ICD-10-CM | POA: Insufficient documentation

## 2023-06-10 DIAGNOSIS — N528 Other male erectile dysfunction: Secondary | ICD-10-CM | POA: Diagnosis not present

## 2023-06-10 DIAGNOSIS — G4730 Sleep apnea, unspecified: Secondary | ICD-10-CM | POA: Insufficient documentation

## 2023-06-10 DIAGNOSIS — E1159 Type 2 diabetes mellitus with other circulatory complications: Secondary | ICD-10-CM

## 2023-06-10 DIAGNOSIS — F319 Bipolar disorder, unspecified: Secondary | ICD-10-CM | POA: Insufficient documentation

## 2023-06-10 DIAGNOSIS — T83490A Other mechanical complication of penile (implanted) prosthesis, initial encounter: Secondary | ICD-10-CM | POA: Diagnosis not present

## 2023-06-10 DIAGNOSIS — N529 Male erectile dysfunction, unspecified: Secondary | ICD-10-CM | POA: Insufficient documentation

## 2023-06-10 DIAGNOSIS — I252 Old myocardial infarction: Secondary | ICD-10-CM | POA: Insufficient documentation

## 2023-06-10 HISTORY — PX: REMOVAL OF PENILE PROSTHESIS: SHX6059

## 2023-06-10 HISTORY — PX: PENILE PROSTHESIS IMPLANT: SHX240

## 2023-06-10 LAB — GLUCOSE, CAPILLARY
Glucose-Capillary: 132 mg/dL — ABNORMAL HIGH (ref 70–99)
Glucose-Capillary: 210 mg/dL — ABNORMAL HIGH (ref 70–99)

## 2023-06-10 SURGERY — REMOVAL, PENILE PROSTHESIS
Anesthesia: General | Site: Penis

## 2023-06-10 MED ORDER — VASOPRESSIN 20 UNIT/ML IV SOLN
INTRAVENOUS | Status: AC
  Filled 2023-06-10: qty 1

## 2023-06-10 MED ORDER — FENTANYL CITRATE (PF) 100 MCG/2ML IJ SOLN
INTRAMUSCULAR | Status: AC
  Filled 2023-06-10: qty 2

## 2023-06-10 MED ORDER — ONDANSETRON HCL 4 MG/2ML IJ SOLN
INTRAMUSCULAR | Status: AC
  Filled 2023-06-10: qty 2

## 2023-06-10 MED ORDER — VASOPRESSIN 20 UNIT/ML IV SOLN
INTRAVENOUS | Status: DC | PRN
  Administered 2023-06-10: 2 [IU] via INTRAVENOUS
  Administered 2023-06-10 (×3): 1 [IU] via INTRAVENOUS

## 2023-06-10 MED ORDER — LIDOCAINE HCL (CARDIAC) PF 100 MG/5ML IV SOSY
PREFILLED_SYRINGE | INTRAVENOUS | Status: DC | PRN
  Administered 2023-06-10: 80 mg via INTRAVENOUS

## 2023-06-10 MED ORDER — OXYCODONE HCL 5 MG PO TABS
5.0000 mg | ORAL_TABLET | Freq: Once | ORAL | Status: AC
  Administered 2023-06-10: 5 mg via ORAL

## 2023-06-10 MED ORDER — PROPOFOL 10 MG/ML IV BOLUS
INTRAVENOUS | Status: AC
  Filled 2023-06-10: qty 20

## 2023-06-10 MED ORDER — OXYCODONE HCL 5 MG PO TABS
ORAL_TABLET | ORAL | Status: AC
  Filled 2023-06-10: qty 1

## 2023-06-10 MED ORDER — PHENYLEPHRINE HCL-NACL 20-0.9 MG/250ML-% IV SOLN
INTRAVENOUS | Status: AC
  Filled 2023-06-10: qty 250

## 2023-06-10 MED ORDER — STERILE WATER FOR IRRIGATION IR SOLN
Status: DC | PRN
  Administered 2023-06-10: 1000 mL

## 2023-06-10 MED ORDER — SULFAMETHOXAZOLE-TRIMETHOPRIM 800-160 MG PO TABS: 1.0000 | ORAL_TABLET | Freq: Two times a day (BID) | ORAL | 0 refills | Status: DC

## 2023-06-10 MED ORDER — LIDOCAINE HCL (PF) 1 % IJ SOLN
INTRAMUSCULAR | Status: AC
  Filled 2023-06-10: qty 30

## 2023-06-10 MED ORDER — MIDAZOLAM HCL 5 MG/5ML IJ SOLN
INTRAMUSCULAR | Status: DC | PRN
  Administered 2023-06-10 (×2): 1 mg via INTRAVENOUS

## 2023-06-10 MED ORDER — ONDANSETRON HCL 4 MG/2ML IJ SOLN
INTRAMUSCULAR | Status: DC | PRN
  Administered 2023-06-10: 4 mg via INTRAVENOUS

## 2023-06-10 MED ORDER — EPHEDRINE 5 MG/ML INJ
INTRAVENOUS | Status: AC
  Filled 2023-06-10: qty 5

## 2023-06-10 MED ORDER — MUPIROCIN 2 % EX OINT
TOPICAL_OINTMENT | CUTANEOUS | Status: AC
  Filled 2023-06-10: qty 22

## 2023-06-10 MED ORDER — MUPIROCIN 2 % EX OINT
1.0000 | TOPICAL_OINTMENT | Freq: Once | CUTANEOUS | Status: AC
  Administered 2023-06-10: 1 via NASAL

## 2023-06-10 MED ORDER — PROPOFOL 10 MG/ML IV BOLUS
INTRAVENOUS | Status: DC | PRN
  Administered 2023-06-10: 100 mg via INTRAVENOUS
  Administered 2023-06-10: 200 mg via INTRAVENOUS

## 2023-06-10 MED ORDER — DEXAMETHASONE SODIUM PHOSPHATE 10 MG/ML IJ SOLN
INTRAMUSCULAR | Status: AC
  Filled 2023-06-10: qty 1

## 2023-06-10 MED ORDER — BUPIVACAINE HCL (PF) 0.5 % IJ SOLN
INTRAMUSCULAR | Status: AC
  Filled 2023-06-10: qty 30

## 2023-06-10 MED ORDER — EPHEDRINE SULFATE (PRESSORS) 50 MG/ML IJ SOLN
INTRAMUSCULAR | Status: DC | PRN
  Administered 2023-06-10 (×3): 10 mg via INTRAVENOUS
  Administered 2023-06-10: 5 mg via INTRAVENOUS
  Administered 2023-06-10 (×2): 10 mg via INTRAVENOUS

## 2023-06-10 MED ORDER — OXYCODONE HCL 5 MG PO TABS: 5.0000 mg | ORAL_TABLET | Freq: Four times a day (QID) | ORAL | 0 refills | Status: DC | PRN

## 2023-06-10 MED ORDER — SODIUM CHLORIDE 0.9 % IR SOLN
Status: DC | PRN
  Administered 2023-06-10: 500 mL

## 2023-06-10 MED ORDER — FENTANYL CITRATE (PF) 100 MCG/2ML IJ SOLN: 25.0000 ug | INTRAMUSCULAR | Status: DC | PRN

## 2023-06-10 MED ORDER — LACTATED RINGERS IV SOLN: INTRAVENOUS | Status: DC

## 2023-06-10 MED ORDER — CHLORHEXIDINE GLUCONATE 4 % EX SOLN: Freq: Once | CUTANEOUS | Status: DC

## 2023-06-10 MED ORDER — LIDOCAINE 2% (20 MG/ML) 5 ML SYRINGE
INTRAMUSCULAR | Status: AC
  Filled 2023-06-10: qty 5

## 2023-06-10 MED ORDER — ACETAMINOPHEN 10 MG/ML IV SOLN: 1000.0000 mg | Freq: Once | INTRAVENOUS | Status: DC | PRN

## 2023-06-10 MED ORDER — DEXAMETHASONE SODIUM PHOSPHATE 4 MG/ML IJ SOLN
INTRAMUSCULAR | Status: DC | PRN
  Administered 2023-06-10: 10 mg via INTRAVENOUS

## 2023-06-10 MED ORDER — MIDAZOLAM HCL 2 MG/2ML IJ SOLN
INTRAMUSCULAR | Status: AC
  Filled 2023-06-10: qty 2

## 2023-06-10 MED ORDER — CELECOXIB 200 MG PO CAPS: 200.0000 mg | ORAL_CAPSULE | Freq: Two times a day (BID) | ORAL | 1 refills | Status: AC | PRN

## 2023-06-10 MED ORDER — LIDOCAINE HCL (PF) 1 % IJ SOLN
INTRAMUSCULAR | Status: DC | PRN
  Administered 2023-06-10: 10 mL

## 2023-06-10 MED ORDER — PHENYLEPHRINE 80 MCG/ML (10ML) SYRINGE FOR IV PUSH (FOR BLOOD PRESSURE SUPPORT)
PREFILLED_SYRINGE | INTRAVENOUS | Status: AC
  Filled 2023-06-10: qty 10

## 2023-06-10 MED ORDER — PHENYLEPHRINE HCL-NACL 20-0.9 MG/250ML-% IV SOLN
INTRAVENOUS | Status: DC | PRN
  Administered 2023-06-10: 40 ug/min via INTRAVENOUS

## 2023-06-10 MED ORDER — FENTANYL CITRATE (PF) 100 MCG/2ML IJ SOLN
INTRAMUSCULAR | Status: DC | PRN
  Administered 2023-06-10 (×3): 50 ug via INTRAVENOUS

## 2023-06-10 MED ORDER — IRRISEPT - 450ML BOTTLE WITH 0.05% CHG IN STERILE WATER, USP 99.95% OPTIME
TOPICAL | Status: DC | PRN
  Administered 2023-06-10: 450 mL
  Administered 2023-06-10: 900 mL

## 2023-06-10 MED ORDER — PHENYLEPHRINE HCL (PRESSORS) 10 MG/ML IV SOLN
INTRAVENOUS | Status: DC | PRN
  Administered 2023-06-10 (×4): 160 ug via INTRAVENOUS

## 2023-06-10 MED ORDER — ACETAMINOPHEN 500 MG PO TABS: 1000.0000 mg | ORAL_TABLET | Freq: Three times a day (TID) | ORAL | 0 refills | Status: DC

## 2023-06-10 SURGICAL SUPPLY — 49 items
BLADE SURG 15 STRL LF DISP TIS (BLADE) ×2 IMPLANT
BNDG GAUZE DERMACEA FLUFF 4 (GAUZE/BANDAGES/DRESSINGS) ×2 IMPLANT
BRIEF MESH DISP 2XL (UNDERPADS AND DIAPERS) ×2 IMPLANT
CATH COUDE 5CC RIBBED (CATHETERS) ×2 IMPLANT
CHLORAPREP W/TINT 26 (MISCELLANEOUS) ×4 IMPLANT
COVER BACK TABLE 60X90IN (DRAPES) ×2 IMPLANT
COVER MAYO STAND STRL (DRAPES) ×4 IMPLANT
DERMABOND ADVANCED .7 DNX12 (GAUZE/BANDAGES/DRESSINGS) ×2 IMPLANT
DRAIN CHANNEL 10F 3/8 F FF (DRAIN) ×2 IMPLANT
DRAPE INCISE IOBAN 66X45 STRL (DRAPES) ×2 IMPLANT
DRAPE LAPAROTOMY 100X72 PEDS (DRAPES) ×2 IMPLANT
DRAPE UTILITY XL STRL (DRAPES) ×2 IMPLANT
DRSG TEGADERM 4X4.75 (GAUZE/BANDAGES/DRESSINGS) ×2 IMPLANT
EVACUATOR SILICONE 100CC (DRAIN) ×2 IMPLANT
GAUZE SPONGE 4X4 12PLY STRL (GAUZE/BANDAGES/DRESSINGS) ×2 IMPLANT
GLOVE BIO SURGEON STRL SZ7 (GLOVE) ×2 IMPLANT
GLOVE BIOGEL PI IND STRL 6.5 (GLOVE) IMPLANT
GLOVE BIOGEL PI IND STRL 7.0 (GLOVE) ×2 IMPLANT
GOWN STRL REUS W/ TWL LRG LVL3 (GOWN DISPOSABLE) IMPLANT
GOWN STRL REUS W/TWL LRG LVL3 (GOWN DISPOSABLE) ×2 IMPLANT
IMPL SNAPCONE RTE CX 2.0 (Urological Implant) IMPLANT
IV NS 500ML BAXH (IV SOLUTION) IMPLANT
KIT ACCESSORY AMS 700 PUMP (Urological Implant) IMPLANT
LAVAGE JET IRRISEPT WOUND (IRRIGATION / IRRIGATOR) ×4 IMPLANT
NDL HYPO 22X1.5 SAFETY MO (MISCELLANEOUS) ×2 IMPLANT
NEEDLE HYPO 22X1.5 SAFETY MO (MISCELLANEOUS) ×2 IMPLANT
PACK BASIN DAY SURGERY FS (CUSTOM PROCEDURE TRAY) ×2 IMPLANT
PENCIL SMOKE EVACUATOR (MISCELLANEOUS) ×2 IMPLANT
PLUG CATH AND CAP STRL 200 (CATHETERS) ×2 IMPLANT
PUMP PENILE AMS 700CX MS 12X21 (Erectile Restoration) IMPLANT
RESERVOIR FLAT IZ 100ML (Miscellaneous) IMPLANT
RETRACTOR DEEP SCROTAL PENILE (MISCELLANEOUS) IMPLANT
RETRACTOR WILSON SYSTEM (INSTRUMENTS) IMPLANT
SET COLLECT BLD 21X.75 12 PB G (NEEDLE) ×2 IMPLANT
SLEEVE SCD COMPRESS KNEE MED (STOCKING) ×2 IMPLANT
SPIKE FLUID TRANSFER (MISCELLANEOUS) IMPLANT
SUT ETHILON 3 0 PS 1 (SUTURE) ×2 IMPLANT
SUT MNCRL AB 4-0 PS2 18 (SUTURE) ×2 IMPLANT
SUT VIC AB 2-0 UR6 27 (SUTURE) ×8 IMPLANT
SUT VIC AB 3-0 SH 27X BRD (SUTURE) ×4 IMPLANT
SYR 10ML LL (SYRINGE) ×6 IMPLANT
SYR 50ML LL SCALE MARK (SYRINGE) ×6 IMPLANT
SYR BULB IRRIG 60ML STRL (SYRINGE) ×2 IMPLANT
SYR CONTROL 10ML LL (SYRINGE) ×2 IMPLANT
TOWEL GREEN STERILE FF (TOWEL DISPOSABLE) ×4 IMPLANT
TRAY DSU PREP LF (CUSTOM PROCEDURE TRAY) ×2 IMPLANT
TUBE CONNECTING 20X1/4 (TUBING) ×2 IMPLANT
WATER STERILE IRR 1000ML POUR (IV SOLUTION) ×2 IMPLANT
YANKAUER SUCT BULB TIP NO VENT (SUCTIONS) ×2 IMPLANT

## 2023-06-10 NOTE — Discharge Instructions (Addendum)
 Penile prosthesis postoperative instructions  Wound:  In most cases your incision will have absorbable sutures that will dissolve within the first 10-20 days. Some will fall out even earlier. Expect some redness as the sutures dissolved but this should occur only around the sutures. If there is generalized redness, especially with increasing pain or swelling, let us know. The scrotum and penis will very likely get "black and blue" as the blood in the tissues spread. Sometimes the whole scrotum will turn colors. The black and blue is followed by a yellow and brown color. In time, all the discoloration will go away. In some cases some firm swelling in the area of the testicle and pump may persist for up to 4-6 weeks after the surgery and is considered normal in most cases.  Drain:   You may be discharged home with a drain in place. If so, you will be taught how to empty it and should keep track of the output. Additionally, you should call the office to arrange for an appointment to have it removed after a few days.   Diet:  You may return to your normal diet within 24 hours following your surgery. You may note some mild nausea and possibly vomiting the first 6-8 hours following surgery. This is usually due to the side effects of anesthesia, and will disappear quite soon. I would suggest clear liquids and a very light meal the first evening following your surgery.  Activity:  Your physical activity should be restricted the first 48 hours. During that time you should remain relatively inactive, moving about only when necessary. During the first 3 weeks following surgery you should avoid lifting any heavy objects (anything greater than 15 pounds), and avoid strenuous exercise. If you work, ask [place] specifically about your restrictions, both for work and home. We will write a note to your employer if needed.  Avoid using your penis until your follow up visit with Dr Lafonda Mosses, which will typically be around  3-4 weeks following the surgery. Most people are able to start cycling their device after that appointment, and can have intercourse soon thereafter.   You should plan to wear a tight pair of jockey shorts or an athletic supporter for the first 4-5 days, even to sleep. This will keep the scrotum immobilized to some degree and keep the swelling down.The position of your penis will determine what is most comfortable but I strongly urge you to keep the penis in the "up" position (toward your head). You should continue to tuck "up" your penis when possible for the first 3 months following surgery.  Ice packs should be placed on and off over the scrotum for the first 48 hours. Frozen peas or corn in a ZipLock bag can be frozen, used and re-frozen. Fifteen minutes on and 15 minutes off is a reasonable schedule. The ice is a good pain reliever and keeps the swelling down.  Hygiene:  You may shower 48 hours after your surgery. Tub bathing should be restricted until the wound is completely healed, typically around 2-3 weeks.  Medication:  You will be sent home with some type of pain medication. In many cases you will be sent home with a strong anti-inflammatory medication (Celebrex, Meloxicam) and a narcotic pain pill (hydrocodone or oxycodone). You can also supplement these medications with tylenol (acetaminophen). If the pain medication you are sent home with does not control the pain, please notify the office Problems you should report to [place]:  Fever of 101.0 degrees  Fahrenheit or greater. Moderate or severe swelling under the skin incision or involving the scrotum. Drug reaction such as hives, a rash, nausea or vomiting.    Post Anesthesia Home Care Instructions  Activity: Get plenty of rest for the remainder of the day. A responsible individual must stay with you for 24 hours following the procedure.  For the next 24 hours, DO NOT: -Drive a car -Advertising copywriter -Drink alcoholic  beverages -Take any medication unless instructed by your physician -Make any legal decisions or sign important papers.  Meals: Start with liquid foods such as gelatin or soup. Progress to regular foods as tolerated. Avoid greasy, spicy, heavy foods. If nausea and/or vomiting occur, drink only clear liquids until the nausea and/or vomiting subsides. Call your physician if vomiting continues.  Special Instructions/Symptoms: Your throat may feel dry or sore from the anesthesia or the breathing tube placed in your throat during surgery. If this causes discomfort, gargle with warm salt water. The discomfort should disappear within 24 hours.  If you had a scopolamine patch placed behind your ear for the management of post- operative nausea and/or vomiting:  1. The medication in the patch is effective for 72 hours, after which it should be removed.  Wrap patch in a tissue and discard in the trash. Wash hands thoroughly with soap and water. 2. You may remove the patch earlier than 72 hours if you experience unpleasant side effects which may include dry mouth, dizziness or visual disturbances. 3. Avoid touching the patch. Wash your hands with soap and water after contact with the patch.

## 2023-06-10 NOTE — Anesthesia Preprocedure Evaluation (Signed)
 Anesthesia Evaluation  Patient identified by MRN, date of birth, ID band Patient awake    Reviewed: Allergy & Precautions, NPO status , Patient's Chart, lab work & pertinent test results  Airway Mallampati: II  TM Distance: >3 FB Neck ROM: Full    Dental no notable dental hx.    Pulmonary sleep apnea and Continuous Positive Airway Pressure Ventilation , former smoker   Pulmonary exam normal        Cardiovascular hypertension, Pt. on medications + CAD, + Past MI and + Cardiac Stents   Rhythm:Regular Rate:Normal     Neuro/Psych   Anxiety Depression Bipolar Disorder      GI/Hepatic Neg liver ROS,GERD  ,,  Endo/Other  diabetes, Type 2    Renal/GU   negative genitourinary   Musculoskeletal  (+) Arthritis , Osteoarthritis,    Abdominal Normal abdominal exam  (+)   Peds  Hematology   Anesthesia Other Findings   Reproductive/Obstetrics                             Anesthesia Physical Anesthesia Plan  ASA: 3  Anesthesia Plan: General   Post-op Pain Management:    Induction: Intravenous  PONV Risk Score and Plan: 2 and Ondansetron , Dexamethasone , Midazolam  and Treatment may vary due to age or medical condition  Airway Management Planned: Mask and LMA  Additional Equipment: None  Intra-op Plan:   Post-operative Plan: Extubation in OR  Informed Consent: I have reviewed the patients History and Physical, chart, labs and discussed the procedure including the risks, benefits and alternatives for the proposed anesthesia with the patient or authorized representative who has indicated his/her understanding and acceptance.     Dental advisory given  Plan Discussed with: CRNA  Anesthesia Plan Comments:        Anesthesia Quick Evaluation

## 2023-06-10 NOTE — Anesthesia Postprocedure Evaluation (Signed)
 Anesthesia Post Note  Patient: Keith Gross.  Procedure(s) Performed: REMOVAL, PENILE PROSTHESIS (Penis) INSERTION, PENILE PROSTHESIS, INFLATABLE (Penis)     Patient location during evaluation: PACU Anesthesia Type: General Level of consciousness: awake and alert Pain management: pain level controlled Vital Signs Assessment: post-procedure vital signs reviewed and stable Respiratory status: spontaneous breathing, nonlabored ventilation, respiratory function stable and patient connected to nasal cannula oxygen Cardiovascular status: blood pressure returned to baseline and stable Postop Assessment: no apparent nausea or vomiting Anesthetic complications: no   No notable events documented.  Last Vitals:  Vitals:   06/10/23 1015 06/10/23 1051  BP: 130/74 126/71  Pulse: 73 70  Resp: 14 16  Temp:  36.6 C  SpO2: 93% 93%    Last Pain:  Vitals:   06/10/23 1046  TempSrc:   PainSc: 8                  Jessiah Wojnar P Ebbie Cherry

## 2023-06-10 NOTE — Transfer of Care (Signed)
 Immediate Anesthesia Transfer of Care Note  Patient: Keith Gross.  Procedure(s) Performed: REMOVAL, PENILE PROSTHESIS (Penis) INSERTION, PENILE PROSTHESIS, INFLATABLE (Penis)  Patient Location: PACU  Anesthesia Type:General  Level of Consciousness: awake, alert , and oriented  Airway & Oxygen Therapy: Patient Spontanous Breathing and Patient connected to face mask oxygen  Post-op Assessment: Report given to RN and Post -op Vital signs reviewed and stable  Post vital signs: Reviewed and stable  Last Vitals:  Vitals Value Taken Time  BP    Temp    Pulse    Resp    SpO2      Last Pain:  Vitals:   06/10/23 0628  TempSrc: Temporal  PainSc: 0-No pain      Patients Stated Pain Goal: 3 (06/10/23 0865)  Complications: No notable events documented.

## 2023-06-10 NOTE — Op Note (Signed)
 PATIENT:  Keith Gross.  PRE-OPERATIVE DIAGNOSIS:   Organic erectile dysfunction Malfunctioning inflatable penile implant  POST-OPERATIVE DIAGNOSIS:  Same  PROCEDURE:   Remove/replacement of inflatable penile implant  SURGEON:  Julene Oaks MD  ASST: none  INDICATION: He has had long-standing organic erectile dysfunction and refractory to other modes of treatment. He has elected to proceed with prosthesis implantation.  ANESTHESIA:  General  EBL:  50 cc  Device: 3 piece AMS CX 700: 95 cc reservoir, 21 cm cylinders and 2 cm rear-tip extenders on right and left sides  LOCAL MEDICATIONS USED:   penile block done with 10 cc lidocaine /marcaine  mixture  SPECIMEN: None  Description of procedure: The patient was taken to the major operating room, placed on the table and administered general anesthesia in the supine position. His genitalia was then prepped with chlorhexidine  x 2. He was draped in the usual sterile fashion, and I used Ioban on the field. An official timeout was then performed.  A dorsal penile block was performed.   I attempted to cycle his implant; I was able to get 2 or 3 pumps in before the pump would depress and no more fluid could be cycled in the. The reservoir was palpable in the right inguinal region and seemed to have lost volume.   A 14 French coude catheter was then placed in the bladder and the bladder was drained and the catheter was plugged. A midline penoscrotal incision was then made and the dissection was carried down to the corpora and urethra. The lonestar retractor was positioned so as to have excellent exposure.   I dissected free the pump and removed some of the pseudocapsule. I then traced each set of tubing towards the cylinders/corpora. I used the bovie to make a corporotomy and removed each cylinder.  I then dissected along the reservoir tubing and encountered the phlange outside the external inguinal ring. I drained it and delivered it  intact.   Two 2-0 Vicryl sutures were then placed proximally in each corpus cavernosum to serve as stay sutures.I then dilated the corpus cavernosum with the a 12 Fr brooks dilator distally and proximally. Field goal post tests were performed and there was no evidence of perforations or crossover. I then irrigated the corpus cavernosum with antibiotic solution and measured the distance proximally and distally from the stay suture and was found to be 10 and 13 cm, respectively.I then turned my attention to the contralateral corpus cavernosum and placed my stay sutures, made my corporotomy and dilated the corpus cavernosum in an identical fashion. This was measured and also was found to be 10 cm proximally and 12.5 distally. It was irrigated with anastomotic solution as was the scrotum. I then chose an 21 cm cylinder set with 2 cm rear-tip extenders and these were prepped while I prepared the site for reservoir placement.  I then digitally probed into the right. My finger was used to poke through the conjoint tendon. I used my finger to ensure I was in the appropriate space, and to clear room for the reservoir. I used a sponge stick to dissect caudally. I irrigated the space with anastomotic solution and then placed the reservoir in this location. I then filled the reservoir with 95 cc of sterile saline, and checked to confirm proper position. There was minimal backpressure with the reservoir max-filled.  Attention was redirected to the corporotomies where the cylinders were then placed by first fixing the suture to the distal aspect of the right  cylinder to a straight needle. This was then loaded on the [facility] inserter and passed through the corporotomy and distally. I then advanced the straight needle with the Furlow inserter out through the glans and this was grasped with a hemostat and pulled through the glans and the suture was secured with a hemostat. I then performed an identical maneuver on the  contralateral side. After this was performed I irrigated both corpus cavernosum; there was no evidence of urethral perforation. I inserted the distal portion of the cylinder through the corporotomies and pulled this to the end of the corpora with the suture. The proximal aspect with the rear-tip extender was then passed through the corporotomy and into the seated position on each side. I then connected reservoir tubing to a syringe filled with sterile saline and inflated the device. I noted a good straight erection with both cylinders equidistant under the glans and no buckling of the cylinders. I therefore deflated the device and closed the corporotomies with used my previously placed stay sutures.   I then grasped the scrotal skin in the midline with a babcock, and used a hemostat to dissect down to the dependent-most portion of the scrotum. The nasal speculum was inserted into this space, and facilitated placement of the pump. The cylinder was then connected to the pump after excising the excess tubing with appropriate shodded hemostats in place and then I used the supplied connectors to make the connection. I then again cycled the device with the pump and it cycled properly. I deflated the device and pumped it up about three quarters of the way to aid with hemostasis. I irrigated the wound one last time with antibiotic irrigation and then closed the deep scrotal tissue over the tubing and pump with running 3-0 monocryl suture. I placed a 10 Fr blake drain over the corporotomies. A second layer was then closed over this first layer with running 3- 0 monocryl, and running skin suture w/ 4-0 monocryl performed. Incision dressed with dermabond.  A mummy wrap was applied. The catheter was removed, and drain connected to suction bulb and the patient was awakened and taken recovery room in stable and satisfactory condition. He tolerated the procedure well and there were no intraoperative complications. Needle sponge  and instrument counts were correct at the end of the operation.

## 2023-06-10 NOTE — Anesthesia Procedure Notes (Signed)
 Procedure Name: LMA Insertion Date/Time: 06/10/2023 7:37 AM  Performed by: Elvia Hammans, CRNAPre-anesthesia Checklist: Patient identified, Emergency Drugs available, Suction available and Patient being monitored Patient Re-evaluated:Patient Re-evaluated prior to induction Oxygen Delivery Method: Circle system utilized Preoxygenation: Pre-oxygenation with 100% oxygen Induction Type: IV induction Ventilation: Mask ventilation without difficulty LMA: LMA inserted LMA Size: 5.0 Number of attempts: 1 Placement Confirmation: positive ETCO2 Tube secured with: Tape Dental Injury: Teeth and Oropharynx as per pre-operative assessment

## 2023-06-10 NOTE — H&P (Signed)
 H&P  History of Present Illness: Keith Stecher. is a 68 y.o. year old M who presents today for remove/replace a malfunctioning inflatable penile prosthesis  No acute complaints  Past Medical History:  Diagnosis Date   Acid reflux    Angina 2013   no current problems   Anxiety    Arthritis    Bipolar disorder (HCC)    type 2   Depression    Diabetes mellitus without complication (HCC)    type 2   Diastasis of muscle 2011   "abdominal"   Dysplastic polyp of colon 2008   [facility]   GERD (gastroesophageal reflux disease)    Gout    Multiple episodes - no current problems   Heart disease    History of kidney stones    surgery to remove stones   Hyperlipidemia    Hypertension    Myocardial infarction (HCC) 05/2011   "we think";  06/2011 abnl mv;  06/2011 Cath - subtotal occlusion of Ramus, otw nonobs dzs;  06/2011 PCI/DES of Ramus w/ 2.25x63mm Resolute DES    Sleep apnea    uses CPAP nightly   Syncopal episodes 05/2020   Syncope 05/2020   Treadmill stress test negative for angina pectoris 2011    Past Surgical History:  Procedure Laterality Date   25 GAUGE PARS PLANA VITRECTOMY WITH 20 GAUGE MVR PORT FOR MACULAR HOLE Right 07/19/2020   Procedure: 25 GAUGE PARS PLANA VITRECTOMY FOR MACULAR HOLE;  Surgeon: Ronelle Coffee, MD;  Location: [facility] OR;  Service: Ophthalmology;  Laterality: Right;   CARDIAC CATHETERIZATION  06/2011   "diagnostic"   COLONOSCOPY     CORONARY ANGIOPLASTY WITH STENT PLACEMENT  07/09/11   "1"   GAS/FLUID EXCHANGE Right 07/19/2020   Procedure: GAS/FLUID EXCHANGE;  Surgeon: Ronelle Coffee, MD;  Location: [facility] OR;  Service: Ophthalmology;  Laterality: Right;  c3 f8   HEMORRHOID SURGERY  2000   INGUINAL HERNIA REPAIR  1987   left   LEFT HEART CATHETERIZATION WITH CORONARY ANGIOGRAM N/A 10/21/2011   Procedure: LEFT HEART CATHETERIZATION WITH CORONARY ANGIOGRAM;  Surgeon: Kristopher Pheasant, MD;  Location: [facility] CATH LAB;  Service: Cardiovascular;  Laterality: N/A;   LITHOTRIPSY   10/2008   MEMBRANE PEEL Right 07/19/2020   Procedure: MEMBRANE PEEL;  Surgeon: Ronelle Coffee, MD;  Location: [facility] OR;  Service: Ophthalmology;  Laterality: Right;   PERCUTANEOUS CORONARY STENT INTERVENTION (PCI-S) N/A 07/09/2011   Procedure: PERCUTANEOUS CORONARY STENT INTERVENTION (PCI-S);  Surgeon: Arnoldo Lapping, MD;  Location: [facility] CATH LAB;  Service: Cardiovascular;  Laterality: N/A;   PHOTOCOAGULATION WITH LASER Right 07/19/2020   Procedure: PHOTOCOAGULATION WITH LASER;  Surgeon: Ronelle Coffee, MD;  Location: [facility] OR;  Service: Ophthalmology;  Laterality: Right;   UPPER GI ENDOSCOPY     URETEROSCOPY  10/2009    Home Medications:  Current Meds  Medication Sig   amLODipine  (NORVASC ) 10 MG tablet TAKE 1 TABLET BY MOUTH DAILY   aspirin  EC 81 MG tablet Take 1 tablet (81 mg total) by mouth daily. Swallow whole.   atorvastatin  (LIPITOR) 20 MG tablet Take 1 tablet (20 mg total) by mouth daily.   fluticasone  (FLONASE ) 50 MCG/ACT nasal spray USE 2 SPRAYS IN EACH NOSTRIL DAILY AS DIRECTED (Patient taking differently: Place 2 sprays into both nostrils at bedtime.)   hydrOXYzine  (ATARAX ) 25 MG tablet Take 25 mg by mouth in the morning and at bedtime.   lamoTRIgine  (LAMICTAL ) 150 MG tablet Take 1 tablet (150 mg total) by mouth at bedtime. (  Patient taking differently: Take 150 mg by mouth 2 (two) times daily.)   Lancets (ONETOUCH ULTRASOFT) lancets Use as instructed to check sugars once daily. Dx E 11.9   magnesium oxide (MAG-OX) 400 MG tablet Take 400 mg by mouth daily.   olmesartan -hydrochlorothiazide  (BENICAR  HCT) 40-25 MG tablet Take 1 tablet by mouth at bedtime.   pantoprazole  (PROTONIX ) 40 MG tablet TAKE 1 TABLET BY MOUTH EVERY DAY   spironolactone  (ALDACTONE ) 25 MG tablet TAKE A HALF TABLET BY MOUTH DAILY   tamsulosin  (FLOMAX ) 0.4 MG CAPS capsule TAKE 1 CAPSULE BY MOUTH DAILY   testosterone  cypionate (DEPOTESTOSTERONE CYPIONATE) 200 MG/ML injection Inject 0.7 mLs (140 mg total) into the muscle every 7  (seven) days. (Patient taking differently: Inject 0.6 mLs into the muscle as directed. TWICE A WEEK WED AND SAT PER PT)   traZODone  (DESYREL ) 100 MG tablet Take 2 tablets (200 mg total) by mouth at bedtime as needed for sleep.   venlafaxine  XR (EFFEXOR -XR) 150 MG 24 hr capsule Take 1 capsule (150 mg total) by mouth daily.   VITAMIN D, CHOLECALCIFEROL, PO Take 2,000 mg by mouth daily.   VITAMIN E  PO Take 2,000 Units by mouth daily.   zinc gluconate 50 MG tablet Take 50 mg by mouth daily.    Allergies: No Known Allergies  Family History  Problem Relation Age of Onset   Heart attack Father 47       LVAD   Suicidality Maternal Uncle    Hypertension Other     Social History:  reports that he quit smoking about 33 years ago. His smoking use included cigarettes. He started smoking about 53 years ago. He has a 10 pack-year smoking history. He has never used smokeless tobacco. He reports that he does not currently use alcohol after a past usage of about 4.0 standard drinks of alcohol per week. He reports that he does not use drugs.  ROS: A complete review of systems was performed.  All systems are negative except for pertinent findings as noted.  Physical Exam:  Vital signs in last 24 hours: Temp:  [97.2 F (36.2 C)] 97.2 F (36.2 C) (04/23 0628) Pulse Rate:  [56] 56 (04/23 0628) Resp:  [18] 18 (04/23 0628) BP: (122)/(62) 122/62 (04/23 0628) SpO2:  [94 %] 94 % (04/23 0628) Weight:  [91.4 kg] 91.4 kg (04/23 0628) Constitutional:  Alert and oriented, No acute distress Cardiovascular: Regular rate and rhythm Respiratory: Normal respiratory effort, Lungs clear bilaterally GI: Abdomen is soft, nontender, nondistended, no abdominal masses Lymphatic: No lymphadenopathy Neurologic: Grossly intact, no focal deficits Psychiatric: Normal mood and affect   Laboratory Data:  No results for input(s): "WBC", "HGB", "HCT", "PLT" in the last 72 hours.  Recent Labs    06/09/23 1302  NA 137  K  4.8  CL 104  GLUCOSE 150*  BUN 29*  CALCIUM  9.5  CREATININE 1.26*     Results for orders placed or performed during the hospital encounter of 06/10/23 (from the past 24 hours)  Basic metabolic panel per protocol     Status: Abnormal   Collection Time: 06/09/23  1:02 PM  Result Value Ref Range   Sodium 137 135 - 145 mmol/L   Potassium 4.8 3.5 - 5.1 mmol/L   Chloride 104 98 - 111 mmol/L   CO2 23 22 - 32 mmol/L   Glucose, Bld 150 (H) 70 - 99 mg/dL   BUN 29 (H) 8 - 23 mg/dL   Creatinine, Ser 1.61 (H) 0.61 -  1.24 mg/dL   Calcium  9.5 8.9 - 10.3 mg/dL   GFR, Estimated >16 >10 mL/min   Anion gap 10 5 - 15  Glucose, capillary     Status: Abnormal   Collection Time: 06/10/23  6:32 AM  Result Value Ref Range   Glucose-Capillary 132 (H) 70 - 99 mg/dL   No results found for this or any previous visit (from the past 240 hours).  Renal Function: Recent Labs    06/09/23 1302  CREATININE 1.26*   Estimated Creatinine Clearance: 63.3 mL/min (A) (by C-G formula based on SCr of 1.26 mg/dL (H)).  Radiologic Imaging: No results found.  Assessment:  Keith Gross. is a 68 y.o. year old M with ED, malfunctioning IPP  Plan:  To OR as planned for IPP remove/replace. Procedure and risks reviewed, including but not limited to bleeding, infection, implant infection, implant malfunction, implant malplacement, erosion, damage to adjacent structures, pain, urinary retention. All questions answered   Julene Oaks, MD 06/10/2023, 7:24 AM  [facility] Pager: [phone]

## 2023-06-11 ENCOUNTER — Encounter (HOSPITAL_BASED_OUTPATIENT_CLINIC_OR_DEPARTMENT_OTHER): Payer: Self-pay | Admitting: Urology

## 2023-06-15 ENCOUNTER — Other Ambulatory Visit: Payer: Self-pay | Admitting: Internal Medicine

## 2023-08-18 ENCOUNTER — Ambulatory Visit (INDEPENDENT_AMBULATORY_CARE_PROVIDER_SITE_OTHER): Admitting: *Deleted

## 2023-08-18 VITALS — Ht 72.0 in | Wt 200.0 lb

## 2023-08-18 DIAGNOSIS — Z0000 Encounter for general adult medical examination without abnormal findings: Secondary | ICD-10-CM | POA: Diagnosis not present

## 2023-08-18 NOTE — Progress Notes (Signed)
 Subjective:   Keith Gross. is a 68 y.o. who presents for a Medicare Wellness preventive visit.  As a reminder, Annual Wellness Visits don't include a physical exam, and some assessments may be limited, especially if this visit is performed virtually. We may recommend an in-person follow-up visit with your provider if needed.  Visit Complete: Virtual I connected with  Natividad Halls. on 08/18/23 by a audio enabled telemedicine application and verified that I am speaking with the correct person using two identifiers.  Patient Location: Home  Provider Location: Home Office  I discussed the limitations of evaluation and management by telemedicine. The patient expressed understanding and agreed to proceed.  Vital Signs: Because this visit was a virtual/telehealth visit, some criteria may be missing or patient reported. Any vitals not documented were not able to be obtained and vitals that have been documented are patient reported.  VideoDeclined- This patient declined Librarian, academic. Therefore the visit was completed with audio only.  Persons Participating in Visit: Patient.  AWV Questionnaire: No: Patient Medicare AWV questionnaire was not completed prior to this visit.  Cardiac Risk Factors include: advanced age (>47men, >73 women);male gender;diabetes mellitus;dyslipidemia;hypertension     Objective:    Today's Vitals   08/18/23 1134  Weight: 200 lb (90.7 kg)  Height: 6' (1.829 m)   Body mass index is 27.12 kg/m.     08/18/2023   11:51 AM 06/10/2023    6:26 AM 06/03/2023   12:57 PM 07/19/2020    9:39 AM 10/19/2011   10:40 PM 07/09/2011    4:03 PM 07/09/2011    1:28 PM  Advanced Directives  Does Patient Have a Medical Advance Directive? Yes Yes Yes Yes Patient has advance directive, copy not in chart  Patient does not have advance directive;Patient would like information  Patient has advance directive, copy in chart   Type of Advance  Directive Healthcare Power of [place];Living will Healthcare Power of [place];Living will Healthcare Power of [place];Living will Living will;Healthcare Power of Attorney Living will   Healthcare Power of [place];Living will   Does patient want to make changes to medical advance directive?  No - Patient declined No - Patient declined      Copy of Healthcare Power of Attorney in Chart? No - copy requested No - copy requested No - copy requested No - copy requested Copy requested from other (Comment)     Would patient like information on creating a medical advance directive?      Referral made to social work    Pre-existing out of facility DNR order (yellow form or pink MOST form)     No   No      Data saved with a previous flowsheet row definition    Current Medications (verified) Outpatient Encounter Medications as of 08/18/2023  Medication Sig   acetaminophen  (TYLENOL ) 500 MG tablet Take 2 tablets (1,000 mg total) by mouth every 8 (eight) hours.   amLODipine  (NORVASC ) 10 MG tablet TAKE 1 TABLET BY MOUTH DAILY   aspirin  EC 81 MG tablet Take 1 tablet (81 mg total) by mouth daily. Swallow whole.   BD HYPODERMIC NEEDLE 16G X 1 MISC FOR USE WITH TESTOSTERONE  INJECTIONS   CINNAMON PO Take 1,000 mg by mouth 2 (two) times daily.    fluticasone  (FLONASE ) 50 MCG/ACT nasal spray USE 2 SPRAYS IN EACH NOSTRIL DAILY AS DIRECTED   hydrOXYzine  (ATARAX ) 25 MG tablet Take 25 mg by mouth in the morning and  at bedtime.   lamoTRIgine  (LAMICTAL ) 150 MG tablet Take 1 tablet (150 mg total) by mouth at bedtime.   Lancets (ONETOUCH ULTRASOFT) lancets Use as instructed to check sugars once daily. Dx E 11.9   MOUNJARO 2.5 MG/0.5ML Pen Inject 2.5 mg into the skin once a week. NOT STARTED YET PER PT   Multiple Vitamin (MULTIVITAMIN) tablet Take 1 tablet by mouth daily.   olmesartan -hydrochlorothiazide  (BENICAR  HCT) 40-25 MG tablet Take 1 tablet by mouth at bedtime.   pantoprazole  (PROTONIX ) 40 MG tablet TAKE 1 TABLET  BY MOUTH DAILY   polyethylene glycol powder (GLYCOLAX /MIRALAX ) powder TAKE 17 GRAMS BY MOUTH EVERY DAY as needed (as directed) (Patient taking differently: daily. TAKE 17 GRAMS BY MOUTH EVERY DAY)   spironolactone  (ALDACTONE ) 25 MG tablet TAKE 0.5 TABLET BY MOUTH DAILY   SYRINGE-NEEDLE, DISP, 3 ML (B-D 3CC LUER-LOK SYR 22GX1) 22G X 1 3 ML MISC FOR TESTOSTERONE  INJECTIONS   tamsulosin  (FLOMAX ) 0.4 MG CAPS capsule TAKE 1 CAPSULE BY MOUTH DAILY   testosterone  cypionate (DEPOTESTOSTERONE CYPIONATE) 200 MG/ML injection Inject 0.7 mLs (140 mg total) into the muscle every 7 (seven) days. (Patient taking differently: Inject 140 mg into the muscle. Takes Sunday and Wednesday)   traZODone  (DESYREL ) 100 MG tablet Take 2 tablets (200 mg total) by mouth at bedtime as needed for sleep.   venlafaxine  XR (EFFEXOR -XR) 150 MG 24 hr capsule Take 1 capsule (150 mg total) by mouth daily.   atorvastatin  (LIPITOR) 20 MG tablet Take 1 tablet (20 mg total) by mouth daily. (Patient not taking: Reported on 08/18/2023)   Cyanocobalamin (VITAMIN B-12) 5000 MCG SUBL Place 5,000 mcg under the tongue daily. (Patient not taking: Reported on 08/18/2023)   magnesium oxide (MAG-OX) 400 MG tablet Take 400 mg by mouth daily. (Patient not taking: Reported on 08/18/2023)   oxyCODONE  (ROXICODONE ) 5 MG immediate release tablet Take 1 tablet (5 mg total) by mouth every 6 (six) hours as needed for severe pain (pain score 7-10). (Patient not taking: Reported on 08/18/2023)   sulfamethoxazole -trimethoprim  (BACTRIM  DS) 800-160 MG tablet Take 1 tablet by mouth 2 (two) times daily. (Patient not taking: Reported on 08/18/2023)   VITAMIN D, CHOLECALCIFEROL, PO Take 2,000 mg by mouth daily. (Patient not taking: Reported on 08/18/2023)   VITAMIN E  PO Take 2,000 Units by mouth daily. (Patient not taking: Reported on 08/18/2023)   zinc gluconate 50 MG tablet Take 50 mg by mouth daily. (Patient not taking: Reported on 08/18/2023)   No facility-administered encounter  medications on file as of 08/18/2023.    Allergies (verified) Patient has no known allergies.   History: Past Medical History:  Diagnosis Date   Acid reflux    Angina 2013   no current problems   Anxiety    Arthritis    Bipolar disorder (HCC)    type 2   Depression    Diabetes mellitus without complication (HCC)    type 2   Diastasis of muscle 2011   abdominal   Dysplastic polyp of colon 2008   [facility]   GERD (gastroesophageal reflux disease)    Gout    Multiple episodes - no current problems   Heart disease    History of kidney stones    surgery to remove stones   Hyperlipidemia    Hypertension    Myocardial infarction (HCC) 05/2011   we think;  06/2011 abnl mv;  06/2011 Cath - subtotal occlusion of Ramus, otw nonobs dzs;  06/2011 PCI/DES of Ramus w/ 2.25x22mm Resolute DES  Sleep apnea    uses CPAP nightly   Syncopal episodes 05/2020   Syncope 05/2020   Treadmill stress test negative for angina pectoris 2011   Past Surgical History:  Procedure Laterality Date   25 GAUGE PARS PLANA VITRECTOMY WITH 20 GAUGE MVR PORT FOR MACULAR HOLE Right 07/19/2020   Procedure: 25 GAUGE PARS PLANA VITRECTOMY FOR MACULAR HOLE;  Surgeon: Valdemar Rogue, MD;  Location: [facility] OR;  Service: Ophthalmology;  Laterality: Right;   CARDIAC CATHETERIZATION  06/2011   diagnostic   COLONOSCOPY     CORONARY ANGIOPLASTY WITH STENT PLACEMENT  07/09/11   1   GAS/FLUID EXCHANGE Right 07/19/2020   Procedure: GAS/FLUID EXCHANGE;  Surgeon: Valdemar Rogue, MD;  Location: [facility] OR;  Service: Ophthalmology;  Laterality: Right;  c3 f8   HEMORRHOID SURGERY  2000   INGUINAL HERNIA REPAIR  1987   left   LEFT HEART CATHETERIZATION WITH CORONARY ANGIOGRAM N/A 10/21/2011   Procedure: LEFT HEART CATHETERIZATION WITH CORONARY ANGIOGRAM;  Surgeon: Debby JONETTA Como, MD;  Location: [facility] CATH LAB;  Service: Cardiovascular;  Laterality: N/A;   LITHOTRIPSY  10/2008   MEMBRANE PEEL Right 07/19/2020   Procedure: MEMBRANE PEEL;  Surgeon:  Valdemar Rogue, MD;  Location: [facility] OR;  Service: Ophthalmology;  Laterality: Right;   PENILE PROSTHESIS IMPLANT  06/10/2023   Procedure: INSERTION, PENILE PROSTHESIS, INFLATABLE;  Surgeon: Lovie Arlyss CROME, MD;  Location: [facility];  Service: Urology;;   PERCUTANEOUS CORONARY STENT INTERVENTION (PCI-S) N/A 07/09/2011   Procedure: PERCUTANEOUS CORONARY STENT INTERVENTION (PCI-S);  Surgeon: Ozell Fell, MD;  Location: [facility] CATH LAB;  Service: Cardiovascular;  Laterality: N/A;   PHOTOCOAGULATION WITH LASER Right 07/19/2020   Procedure: PHOTOCOAGULATION WITH LASER;  Surgeon: Valdemar Rogue, MD;  Location: [facility] OR;  Service: Ophthalmology;  Laterality: Right;   REMOVAL OF PENILE PROSTHESIS N/A 06/10/2023   Procedure: REMOVAL, PENILE PROSTHESIS;  Surgeon: Lovie Arlyss CROME, MD;  Location:  SURGERY CENTER;  Service: Urology;  Laterality: N/A;  REMOVAL AND REPLACEMENT OF INFLATABLE PENILE PROSTHESIS   UPPER GI ENDOSCOPY     URETEROSCOPY  10/2009   Family History  Problem Relation Age of Onset   Heart attack Father 57       LVAD   Suicidality Maternal Uncle    Hypertension Other    Social History   Socioeconomic History   Marital status: Married    Spouse name: Not on file   Number of children: 4   Years of education: Not on file   Highest education level: Some college, no degree  Occupational History   Occupation: Horticulturist, commercial  Tobacco Use   Smoking status: Former    Current packs/day: 0.00    Average packs/day: 0.5 packs/day for 20.0 years (10.0 ttl pk-yrs)    Types: Cigarettes    Start date: 02/17/1970    Quit date: 02/17/1990    Years since quitting: 33.5   Smokeless tobacco: Never  Vaping Use   Vaping status: Never Used  Substance and Sexual Activity   Alcohol use: Not Currently    Alcohol/week: 4.0 standard drinks of alcohol    Types: 2 Glasses of wine, 2 Cans of beer per week   Drug use: Never    Frequency: 7.0 times per week    Comment: 07/09/11 lets  say a cigarette a day Last used   Sexual activity: Yes    Birth control/protection: None  Other Topics Concern   Not on file  Social History Narrative   Married  and has 4 children.     Social Drivers of Corporate investment banker Strain: Low Risk  (08/18/2023)   Overall Financial Resource Strain (CARDIA)    Difficulty of Paying Living Expenses: Not hard at all  Food Insecurity: No Food Insecurity (08/18/2023)   Hunger Vital Sign    Worried About Running Out of Food in the Last Year: Never true    Ran Out of Food in the Last Year: Never true  Transportation Needs: No Transportation Needs (08/18/2023)   PRAPARE - Administrator, Civil Service (Medical): No    Lack of Transportation (Non-Medical): No  Physical Activity: Insufficiently Active (08/18/2023)   Exercise Vital Sign    Days of Exercise per Week: 2 days    Minutes of Exercise per Session: 60 min  Stress: No Stress Concern Present (08/18/2023)   [employer] of Occupational Health - Occupational Stress Questionnaire    Feeling of Stress: Only a little  Social Connections: Moderately Integrated (08/18/2023)   Social Connection and Isolation Panel    Frequency of Communication with Friends and Family: More than three times a week    Frequency of Social Gatherings with Friends and Family: Once a week    Attends Religious Services: More than 4 times per year    Active Member of [employer] or Organizations: No    Attends Engineer, structural: Never    Marital Status: Married    Tobacco Counseling Counseling given: Not Answered    Clinical Intake:  Pre-visit preparation completed: Yes  Pain : No/denies pain     BMI - recorded: 27.12 Nutritional Status: BMI of 19-24  Normal Nutritional Risks: None Diabetes: Yes CBG done?: Yes (per patient FBS 109)  Lab Results  Component Value Date   HGBA1C 7.4 (H) 03/11/2023   HGBA1C 6.4 11/11/2022   HGBA1C 6.5 11/07/2021     How often do you need to have  someone help you when you read instructions, pamphlets, or other written materials from your doctor or pharmacy?: 1 - Never  Interpreter Needed?: No  Information entered by :: R. Shilee Biggs LPN   Activities of Daily Living     08/18/2023   11:36 AM 06/10/2023    6:28 AM  In your present state of health, do you have any difficulty performing the following activities:  Hearing? 0 0  Vision? 0 0  Comment glasses   Difficulty concentrating or making decisions? 0 0  Walking or climbing stairs? 0   Dressing or bathing? 0   Doing errands, shopping? 0   Preparing Food and eating ? N   Using the Toilet? N   In the past six months, have you accidently leaked urine? N   Do you have problems with loss of bowel control? N   Managing your Medications? N   Managing your Finances? N   Housekeeping or managing your Housekeeping? N     Patient Care Team: Glendia Shad, MD as PCP - General (Internal Medicine) Perla Evalene PARAS, MD as PCP - [facility]iology [facility]  I have updated your Care Teams any recent Medical Services you may have received from other providers in the past year.     Assessment:   This is a routine wellness examination for Nicole.  Hearing/Vision screen Hearing Screening - Comments:: No issues Vision Screening - Comments:: glasses   Goals Addressed             This Visit's Progress  Patient Stated       Wants to live as long as he can to watch his family grow up       Depression Screen     08/18/2023   11:46 AM 11/11/2022    8:06 AM 02/20/2022    4:16 PM 12/19/2021    5:39 PM 11/12/2021    8:42 AM 08/12/2021   11:35 AM 10/19/2019    8:46 AM  PHQ 2/9 Scores  PHQ - 2 Score 0 0 0  0 0 0  PHQ- 9 Score 0 0          Information is confidential and restricted. Go to Review Flowsheets to unlock data.    Fall Risk     08/18/2023   11:38 AM 11/11/2022    8:06 AM 02/20/2022    4:16 PM 11/12/2021    8:42 AM 08/12/2021   11:34 AM  Fall Risk    Falls in the past year? 0 0 0 0 0  Number falls in past yr: 0 0 0    Injury with Fall? 0 0 0    Risk for fall due to : No Fall Risks No Fall Risks No Fall Risks No Fall Risks No Fall Risks  Follow up Falls evaluation completed;Falls prevention discussed Falls evaluation completed Falls evaluation completed  Falls evaluation completed       Data saved with a previous flowsheet row definition    MEDICARE RISK AT HOME:  Medicare Risk at Home Any stairs in or around the home?: Yes If so, are there any without handrails?: No Home free of loose throw rugs in walkways, pet beds, electrical cords, etc?: Yes Adequate lighting in your home to reduce risk of falls?: Yes Life alert?: No Use of a cane, walker or w/c?: No Grab bars in the bathroom?: No Shower chair or bench in shower?: No Elevated toilet seat or a handicapped toilet?: Yes  TIMED UP AND GO:  Was the test performed?  No  Cognitive Function: 6CIT completed        08/18/2023   11:51 AM  6CIT Screen  What Year? 0 points  What month? 0 points  What time? 0 points  Count back from 20 0 points  Months in reverse 0 points  Repeat phrase 0 points  Total Score 0 points    Immunizations Immunization History  Administered Date(s) Administered   Influenza,inj,Quad PF,6+ Mos 10/29/2012, 10/20/2013, 01/14/2017, 12/15/2017, 01/05/2019, 02/02/2020   Influenza-Unspecified 11/27/2014, 12/26/2015   PFIZER(Purple Top)SARS-COV-2 Vaccination 07/12/2019, 08/02/2019   Pneumococcal Polysaccharide-23 01/05/2019    Screening Tests Health Maintenance  Topic Date Due   Diabetic kidney evaluation - Urine ACR  Never done   COVID-19 Vaccine (3 - Pfizer risk series) 08/30/2019   OPHTHALMOLOGY EXAM  03/20/2021   Medicare Annual Wellness (AWV)  09/04/2023   Pneumococcal Vaccine: 50+ Years (2 of 2 - PCV) 11/11/2023 (Originally 01/05/2020)   Zoster Vaccines- Shingrix (1 of 2) 11/11/2023 (Originally 08/10/1974)   HEMOGLOBIN A1C  09/08/2023    INFLUENZA VACCINE  09/18/2023   FOOT EXAM  11/11/2023   Diabetic kidney evaluation - eGFR measurement  06/08/2024   Colonoscopy  01/29/2027   Hepatitis C Screening  Completed   Hepatitis B Vaccines  Aged Out   HPV VACCINES  Aged Out   Meningococcal B Vaccine  Aged Out   DTaP/Tdap/Td  Discontinued    Health Maintenance  Health Maintenance Due  Topic Date Due   Diabetic kidney evaluation - Urine ACR  Never  done   COVID-19 Vaccine (3 - Pfizer risk series) 08/30/2019   OPHTHALMOLOGY EXAM  03/20/2021   Medicare Annual Wellness (AWV)  09/04/2023   Health Maintenance Items Addressed: Patient declines vaccines.   Additional Screening:  Vision Screening: Recommended annual ophthalmology exams for early detection of glaucoma and other disorders of the eye. Up to date [facility]  Will send for last office notes Would you like a referral to an eye doctor? No    Dental Screening: Recommended annual dental exams for proper oral hygiene  Community Resource Referral / Chronic Care Management: CRR required this visit?  No   CCM required this visit?  No   Plan:    I have personally reviewed and noted the following in the patient's chart:   Medical and social history Use of alcohol, tobacco or illicit drugs  Current medications and supplements including opioid prescriptions. Patient is not currently taking opioid prescriptions. Functional ability and status Nutritional status Physical activity Advanced directives List of other physicians Hospitalizations, surgeries, and ER visits in previous 12 months Vitals Screenings to include cognitive, depression, and falls Referrals and appointments  In addition, I have reviewed and discussed with patient certain preventive protocols, quality metrics, and best practice recommendations. A written personalized care plan for preventive services as well as general preventive health recommendations were provided to patient.   Angeline Fredericks,  LPN   03/27/7972   After Visit Summary: (MyChart) Due to this being a telephonic visit, the after visit summary with patients personalized plan was offered to patient via MyChart   Notes: Nothing significant to report at this time.

## 2023-08-18 NOTE — Patient Instructions (Signed)
 Keith Gross , Thank you for taking time out of your busy schedule to complete your Annual Wellness Visit with me. I enjoyed our conversation and look forward to speaking with you again next year. I, as well as your care team,  appreciate your ongoing commitment to your health goals. Please review the following plan we discussed and let me know if I can assist you in the future. Your Game plan/ To Do List    Referrals: If you haven't heard from the office you've been referred to, please reach out to them at the phone provided.  Consider updating your vaccines Follow up Visits: Next Medicare AWV with our clinical staff: 08/29/24 @ 10:10   Have you seen your provider in the last 6 months (3 months if uncontrolled diabetes)? Yes Next Office Visit with your provider: 10/27/23  Clinician Recommendations:  Aim for 30 minutes of exercise or brisk walking, 6-8 glasses of water , and 5 servings of fruits and vegetables each day.       This is a list of the screening recommended for you and due dates:  Health Maintenance  Topic Date Due   Yearly kidney health urinalysis for diabetes  Never done   COVID-19 Vaccine (3 - Pfizer risk series) 08/30/2019   Eye exam for diabetics  03/20/2021   Pneumococcal Vaccine for age over 7 (2 of 2 - PCV) 11/11/2023*   Zoster (Shingles) Vaccine (1 of 2) 11/11/2023*   Hemoglobin A1C  09/08/2023   Flu Shot  09/18/2023   Complete foot exam   11/11/2023   Yearly kidney function blood test for diabetes  06/08/2024   Medicare Annual Wellness Visit  08/17/2024   Colon Cancer Screening  01/29/2027   Hepatitis C Screening  Completed   Hepatitis B Vaccine  Aged Out   HPV Vaccine  Aged Out   Meningitis B Vaccine  Aged Out   DTaP/Tdap/Td vaccine  Discontinued  *Topic was postponed. The date shown is not the original due date.    Advanced directives: (Copy Requested) Please bring a copy of your health care power of attorney and living will to the office to be added to your  chart at your convenience. You can mail to [facility] [address] [place], [place] [address] or email to [email or web address] Advance Care Planning is important because it:  [x]  Makes sure you receive the medical care that is consistent with your values, goals, and preferences  [x]  It provides guidance to your family and loved ones and reduces their decisional burden about whether or not they are making the right decisions based on your wishes.

## 2023-09-06 ENCOUNTER — Other Ambulatory Visit: Payer: Self-pay | Admitting: Cardiovascular Disease

## 2023-09-08 ENCOUNTER — Other Ambulatory Visit: Payer: Self-pay

## 2023-09-08 MED ORDER — OLMESARTAN MEDOXOMIL-HCTZ 40-25 MG PO TABS: 1.0000 | ORAL_TABLET | Freq: Every day | ORAL | 0 refills | Status: DC

## 2023-10-20 ENCOUNTER — Other Ambulatory Visit: Payer: Self-pay | Admitting: Internal Medicine

## 2023-10-27 ENCOUNTER — Ambulatory Visit: Admitting: Internal Medicine

## 2023-10-27 ENCOUNTER — Encounter: Payer: Self-pay | Admitting: Internal Medicine

## 2023-10-27 VITALS — BP 120/72 | HR 68 | Resp 16 | Ht 72.0 in | Wt 202.8 lb

## 2023-10-27 DIAGNOSIS — F419 Anxiety disorder, unspecified: Secondary | ICD-10-CM | POA: Diagnosis not present

## 2023-10-27 DIAGNOSIS — G4730 Sleep apnea, unspecified: Secondary | ICD-10-CM

## 2023-10-27 DIAGNOSIS — I10 Essential (primary) hypertension: Secondary | ICD-10-CM | POA: Diagnosis not present

## 2023-10-27 DIAGNOSIS — D696 Thrombocytopenia, unspecified: Secondary | ICD-10-CM

## 2023-10-27 DIAGNOSIS — E1159 Type 2 diabetes mellitus with other circulatory complications: Secondary | ICD-10-CM

## 2023-10-27 DIAGNOSIS — Z7985 Long-term (current) use of injectable non-insulin antidiabetic drugs: Secondary | ICD-10-CM

## 2023-10-27 DIAGNOSIS — E291 Testicular hypofunction: Secondary | ICD-10-CM

## 2023-10-27 DIAGNOSIS — Z860100 Personal history of colon polyps, unspecified: Secondary | ICD-10-CM | POA: Diagnosis not present

## 2023-10-27 DIAGNOSIS — E782 Mixed hyperlipidemia: Secondary | ICD-10-CM | POA: Diagnosis not present

## 2023-10-27 DIAGNOSIS — K2100 Gastro-esophageal reflux disease with esophagitis, without bleeding: Secondary | ICD-10-CM | POA: Diagnosis not present

## 2023-10-27 DIAGNOSIS — I25118 Atherosclerotic heart disease of native coronary artery with other forms of angina pectoris: Secondary | ICD-10-CM

## 2023-10-27 DIAGNOSIS — Z125 Encounter for screening for malignant neoplasm of prostate: Secondary | ICD-10-CM

## 2023-10-27 DIAGNOSIS — F3181 Bipolar II disorder: Secondary | ICD-10-CM

## 2023-10-27 LAB — HEPATIC FUNCTION PANEL
ALT: 22 U/L (ref 0–53)
AST: 23 U/L (ref 0–37)
Albumin: 4.2 g/dL (ref 3.5–5.2)
Alkaline Phosphatase: 39 U/L (ref 39–117)
Bilirubin, Direct: 0.1 mg/dL (ref 0.0–0.3)
Total Bilirubin: 0.6 mg/dL (ref 0.2–1.2)
Total Protein: 6.7 g/dL (ref 6.0–8.3)

## 2023-10-27 LAB — LIPID PANEL
Cholesterol: 151 mg/dL (ref 0–200)
HDL: 28.2 mg/dL — ABNORMAL LOW (ref >39.00)
LDL Cholesterol: 88 mg/dL (ref 0–99)
NonHDL: 123.07
Total CHOL/HDL Ratio: 5
Triglycerides: 175 mg/dL — ABNORMAL HIGH (ref 0.0–149.0)
VLDL: 35 mg/dL (ref 0.0–40.0)

## 2023-10-27 LAB — MICROALBUMIN / CREATININE URINE RATIO
Creatinine,U: 94.3 mg/dL
Microalb Creat Ratio: UNDETERMINED mg/g (ref 0.0–30.0)
Microalb, Ur: <0.7 mg/dL

## 2023-10-27 LAB — CBC WITH DIFFERENTIAL/PLATELET
Basophils Absolute: 0 K/uL (ref 0.0–0.1)
Basophils Relative: 0.3 % (ref 0.0–3.0)
Eosinophils Absolute: 0.1 K/uL (ref 0.0–0.7)
Eosinophils Relative: 1.7 % (ref 0.0–5.0)
HCT: 48.9 % (ref 39.0–52.0)
Hemoglobin: 16.4 g/dL (ref 13.0–17.0)
Lymphocytes Relative: 22.7 % (ref 12.0–46.0)
Lymphs Abs: 1.6 K/uL (ref 0.7–4.0)
MCHC: 33.6 g/dL (ref 30.0–36.0)
MCV: 91.7 fl (ref 78.0–100.0)
Monocytes Absolute: 0.5 K/uL (ref 0.1–1.0)
Monocytes Relative: 7.4 % (ref 3.0–12.0)
Neutro Abs: 4.8 K/uL (ref 1.4–7.7)
Neutrophils Relative %: 67.9 % (ref 43.0–77.0)
Platelets: 154 K/uL (ref 150.0–400.0)
RBC: 5.33 Mil/uL (ref 4.22–5.81)
RDW: 13.1 % (ref 11.5–15.5)
WBC: 7.1 K/uL (ref 4.0–10.5)

## 2023-10-27 LAB — BASIC METABOLIC PANEL WITH GFR
BUN: 31 mg/dL — ABNORMAL HIGH (ref 6–23)
CO2: 28 meq/L (ref 19–32)
Calcium: 9.5 mg/dL (ref 8.4–10.5)
Chloride: 103 meq/L (ref 96–112)
Creatinine, Ser: 1.17 mg/dL (ref 0.40–1.50)
GFR: 64.64 mL/min (ref >60.00)
Glucose, Bld: 130 mg/dL — ABNORMAL HIGH (ref 70–99)
Potassium: 3.8 meq/L (ref 3.5–5.1)
Sodium: 138 meq/L (ref 135–145)

## 2023-10-27 LAB — TSH: TSH: 3.53 u[IU]/mL (ref 0.35–5.50)

## 2023-10-27 LAB — PSA, MEDICARE: PSA: 0.5 ng/mL (ref 0.10–4.00)

## 2023-10-27 LAB — HEMOGLOBIN A1C: Hgb A1c MFr Bld: 6.2 % (ref 4.6–6.5)

## 2023-10-27 NOTE — Assessment & Plan Note (Signed)
 Is seeing Dr Jullie Erp. Apparently on high dose testosterone . Check psa. Continue f/u with urology. Need records to review.  Discussed.

## 2023-10-27 NOTE — Assessment & Plan Note (Signed)
 Follow cbc.

## 2023-10-27 NOTE — Assessment & Plan Note (Signed)
 Seeing psychiatry.  Appears to be doing well on current regimen. Continues on lamictal  and effexor . Continue f/u with psychiatry. Has appt tomorrow. Plans to discuss decrease in trazodone .

## 2023-10-27 NOTE — Progress Notes (Signed)
 Subjective:    Patient ID: Keith Gross., male    DOB: 1955-07-11, 68 y.o.   MRN: [id number]  Patient here for  Chief Complaint  Patient presents with   Medical Management of Chronic Issues    HPI Here for a scheduled follow up - follow up regarding hypercholesterolemia and hypertension. Remains on benicar /hydrochlorothiazide , amlodipine  and spironolactone . Seeing psychiatry. Continues on lamictal  and effexor .  Continues cpap. Is followed by Keith Gross. Last seen 11/17/22. Stable. Has f/u soon. Stays active. No chest pain or sob reported. No abdominal pain reported. Is seeing Keith Gross (nutrition specialist). States he has him on increased dose of testosterone  twice a week. Also has started him on mounjaro. Did not tolerate the 7.5mg  dose. Is taking 5mg  and feels he is doing ok on this dose. Had low sugars with 7.5mg  dose. Discussed getting adequate protein and staying hydrated. Bowels moving - with miralax . Some occasional nausea the day after the injection, but is better. Appetite is better. Is due to see his psychiatrist tomorrow Keith Gross). Plans to discuss possibly decreasing his trazodone . Feeling a little groggy.    Past Medical History:  Diagnosis Date   Acid reflux    Angina 2013   no current problems   Anxiety    Arthritis    Bipolar disorder (HCC)    type 2   Depression    Diabetes mellitus without complication (HCC)    type 2   Diastasis of muscle 2011   abdominal   Dysplastic polyp of colon 2008   [facility]   GERD (gastroesophageal reflux disease)    Gout    Multiple episodes - no current problems   Heart disease    History of kidney stones    surgery to remove stones   Hyperlipidemia    Hypertension    Myocardial infarction (HCC) 05/2011   we think;  06/2011 abnl mv;  06/2011 Cath - subtotal occlusion of Ramus, otw nonobs dzs;  06/2011 PCI/DES of Ramus w/ 2.25x38mm Resolute DES    Sleep apnea    uses CPAP nightly   Syncopal episodes 05/2020   Syncope  05/2020   Treadmill stress test negative for angina pectoris 2011   Past Surgical History:  Procedure Laterality Date   25 GAUGE PARS PLANA VITRECTOMY WITH 20 GAUGE MVR PORT FOR MACULAR HOLE Right 07/19/2020   Procedure: 25 GAUGE PARS PLANA VITRECTOMY FOR MACULAR HOLE;  Surgeon: Keith Rogue, MD;  Location: [facility] OR;  Service: Ophthalmology;  Laterality: Right;   CARDIAC CATHETERIZATION  06/2011   diagnostic   COLONOSCOPY     CORONARY ANGIOPLASTY WITH STENT PLACEMENT  07/09/11   1   GAS/FLUID EXCHANGE Right 07/19/2020   Procedure: GAS/FLUID EXCHANGE;  Surgeon: Keith Rogue, MD;  Location: [facility] OR;  Service: Ophthalmology;  Laterality: Right;  c3 f8   HEMORRHOID SURGERY  2000   INGUINAL HERNIA REPAIR  1987   left   LEFT HEART CATHETERIZATION WITH CORONARY ANGIOGRAM N/A 10/21/2011   Procedure: LEFT HEART CATHETERIZATION WITH CORONARY ANGIOGRAM;  Surgeon: Keith JONETTA Como, MD;  Location[facility] C[facility] LAB;  Service: Cardiovascular;  Laterality: N/A;   LITHOTRIPSY  10/2008   MEMBRANE PEEL Right 07/19/2020   Procedure: MEMBRANE PEEL;  Surgeon: Keith Rogue, MD;  Location: [facility] OR;  Service: Ophthalmology;  Laterality: Right;   PENILE PROSTHESIS IMPLANT  06/10/2023   Procedure: INSERTION, PENILE PROSTHESIS, INFLATABLE;  Surgeon: Keith Arlyss CROME, MD;  Location: [facility];  Service: Urology;;  PERCUTANEOUS CORONARY STENT INTERVENTION (PCI-S) N/A 07/09/2011   Procedure: PERCUTANEOUS CORONARY STENT INTERVENTION (PCI-S);  Surgeon: Keith Fell, MD;  Location: [facility] CATH LAB;  Service: Cardiovascular;  Laterality: N/A;   PHOTOCOAGULATION WITH LASER Right 07/19/2020   Procedure: PHOTOCOAGULATION WITH LASER;  Surgeon: Keith Rogue, MD;  Location: [facility] OR;  Service: Ophthalmology;  Laterality: Right;   REMOVAL OF PENILE PROSTHESIS N/A 06/10/2023   Procedure: REMOVAL, PENILE PROSTHESIS;  Surgeon: Keith Arlyss CROME, MD;  Location: [facility];  Service: Urology;  Laterality: N/A;  REMOVAL AND  REPLACEMENT OF INFLATABLE PENILE PROSTHESIS   UPPER GI ENDOSCOPY     URETEROSCOPY  10/2009   Family History  Problem Relation Age of Onset   Heart attack Father 21       LVAD   Suicidality Maternal Uncle    Hypertension Other    Social History   Socioeconomic History   Marital status: Married    Spouse name: Not on file   Number of children: 4   Years of education: Not on file   Highest education level: Some college, no degree  Occupational History   Occupation: Horticulturist, commercial  Tobacco Use   Smoking status: Former    Current packs/day: 0.00    Average packs/day: 0.5 packs/day for 20.0 years (10.0 ttl pk-yrs)    Types: Cigarettes    Start date: 02/17/1970    Quit date: 02/17/1990    Years since quitting: 33.7   Smokeless tobacco: Never  Vaping Use   Vaping status: Never Used  Substance and Sexual Activity   Alcohol use: Not Currently    Alcohol/week: 4.0 standard drinks of alcohol    Types: 2 Glasses of wine, 2 Cans of beer per week   Drug use: Never    Frequency: 7.0 times per week    Comment: 07/09/11 lets say a cigarette a day Last used   Sexual activity: Yes    Birth control/protection: None  Other Topics Concern   Not on file  Social History Narrative   Married and has 4 children.     Social Drivers of Corporate investment banker Strain: Low Risk  (08/18/2023)   Overall Financial Resource Strain (CARDIA)    Difficulty of Paying Living Expenses: Not hard at all  Food Insecurity: No Food Insecurity (08/18/2023)   Hunger Vital Sign    Worried About Running Out of Food in the Last Year: Never true    Ran Out of Food in the Last Year: Never true  Transportation Needs: No Transportation Needs (08/18/2023)   PRAPARE - Administrator, Civil Service (Medical): No    Lack of Transportation (Non-Medical): No  Physical Activity: Insufficiently Active (08/18/2023)   Exercise Vital Sign    Days of Exercise per Week: 2 days    Minutes of Exercise per  Session: 60 min  Stress: No Stress Concern Present (08/18/2023)   Keith Gross of Occupational Health - Occupational Stress Questionnaire    Feeling of Stress: Only a little  Social Connections: Moderately Integrated (08/18/2023)   Social Connection and Isolation Panel    Frequency of Communication with Friends and Family: More than three times a week    Frequency of Social Gatherings with Friends and Family: Once a week    Attends Religious Services: More than 4 times per year    Active Member of [employer] or Organizations: No    Attends Banker Meetings: Never    Marital Status: Married  Review of Systems  Constitutional:  Negative for unexpected weight change.       Appetite better.   HENT:  Negative for congestion and sinus pressure.   Respiratory:  Negative for cough, chest tightness and shortness of breath.   Cardiovascular:  Negative for chest pain, palpitations and leg swelling.  Gastrointestinal:  Negative for abdominal pain, diarrhea, nausea and vomiting.  Genitourinary:  Negative for difficulty urinating and dysuria.  Musculoskeletal:  Negative for joint swelling and myalgias.  Skin:  Negative for color change and rash.  Neurological:  Negative for dizziness and headaches.  Psychiatric/Behavioral:  Negative for agitation and dysphoric mood.        Objective:     BP 120/72   Pulse 68   Resp 16   Ht 6' (1.829 m)   Wt 202 lb 12.8 oz (92 kg)   SpO2 99%   BMI 27.50 kg/m  Wt Readings from Last 3 Encounters:  10/27/23 202 lb 12.8 oz (92 kg)  08/18/23 200 lb (90.7 kg)  06/10/23 201 lb 8 oz (91.4 kg)    Physical Exam Vitals reviewed.  Constitutional:      General: He is not in acute distress.    Appearance: Normal appearance. He is well-developed.  HENT:     Head: Normocephalic and atraumatic.     Right Ear: External ear normal.     Left Ear: External ear normal.     Mouth/Throat:     Pharynx: No oropharyngeal exudate or posterior oropharyngeal  erythema.  Eyes:     General: No scleral icterus.       Right eye: No discharge.        Left eye: No discharge.     Conjunctiva/sclera: Conjunctivae normal.  Cardiovascular:     Rate and Rhythm: Normal rate and regular rhythm.  Pulmonary:     Effort: Pulmonary effort is normal. No respiratory distress.     Breath sounds: Normal breath sounds.  Abdominal:     General: Bowel sounds are normal.     Palpations: Abdomen is soft.     Tenderness: There is no abdominal tenderness.  Musculoskeletal:        General: No swelling or tenderness.     Cervical back: Neck supple. No tenderness.  Lymphadenopathy:     Cervical: No cervical adenopathy.  Skin:    Findings: No erythema or rash.  Neurological:     Mental Status: He is alert.  Psychiatric:        Mood and Affect: Mood normal.        Behavior: Behavior normal.         Outpatient Encounter Medications as of 10/27/2023  Medication Sig   amLODipine  (NORVASC ) 10 MG tablet TAKE 1 TABLET BY MOUTH DAILY   aspirin  EC 81 MG tablet Take 1 tablet (81 mg total) by mouth daily. Swallow whole.   BD HYPODERMIC NEEDLE 16G X 1 MISC FOR USE WITH TESTOSTERONE  INJECTIONS   fluticasone  (FLONASE ) 50 MCG/ACT nasal spray USE 2 SPRAYS IN EACH NOSTRIL DAILY AS DIRECTED   hydrOXYzine  (ATARAX ) 25 MG tablet Take 25 mg by mouth in the morning and at bedtime.   lamoTRIgine  (LAMICTAL ) 150 MG tablet Take 1 tablet (150 mg total) by mouth at bedtime.   Lancets (ONETOUCH ULTRASOFT) lancets Use as instructed to check sugars once daily. Dx E 11.9   MOUNJARO 5 MG/0.5ML Pen Inject 5 mg into the skin once a week.   Multiple Vitamin (MULTIVITAMIN) tablet Take 1 tablet by mouth  daily.   olmesartan -hydrochlorothiazide  (BENICAR  HCT) 40-25 MG tablet Take 1 tablet by mouth at bedtime.   pantoprazole  (PROTONIX ) 40 MG tablet TAKE 1 TABLET BY MOUTH DAILY   polyethylene glycol powder (GLYCOLAX /MIRALAX ) powder TAKE 17 GRAMS BY MOUTH EVERY DAY as needed (as directed) (Patient  taking differently: daily. TAKE 17 GRAMS BY MOUTH EVERY DAY)   spironolactone  (ALDACTONE ) 25 MG tablet TAKE 0.5 TABLET BY MOUTH DAILY   SYRINGE-NEEDLE, DISP, 3 ML (B-D 3CC LUER-LOK SYR 22GX1) 22G X 1 3 ML MISC FOR TESTOSTERONE  INJECTIONS   tamsulosin  (FLOMAX ) 0.4 MG CAPS capsule TAKE 1 CAPSULE BY MOUTH DAILY   testosterone  cypionate (DEPOTESTOSTERONE CYPIONATE) 200 MG/ML injection Inject 0.7 mLs (140 mg total) into the muscle every 7 (seven) days. (Patient taking differently: Inject 140 mg into the muscle. Takes Sunday and Wednesday)   traZODone  (DESYREL ) 100 MG tablet Take 2 tablets (200 mg total) by mouth at bedtime as needed for sleep.   venlafaxine  XR (EFFEXOR -XR) 150 MG 24 hr capsule Take 1 capsule (150 mg total) by mouth daily.   VITAMIN D, CHOLECALCIFEROL, PO Take 2,000 mg by mouth daily.   [DISCONTINUED] MOUNJARO 2.5 MG/0.5ML Pen Inject 2.5 mg into the skin once a week. NOT STARTED YET PER PT   [DISCONTINUED] acetaminophen  (TYLENOL ) 500 MG tablet Take 2 tablets (1,000 mg total) by mouth every 8 (eight) hours.   [DISCONTINUED] atorvastatin  (LIPITOR) 20 MG tablet Take 1 tablet (20 mg total) by mouth daily. (Patient not taking: Reported on 08/18/2023)   [DISCONTINUED] CINNAMON PO Take 1,000 mg by mouth 2 (two) times daily.    [DISCONTINUED] Cyanocobalamin (VITAMIN B-12) 5000 MCG SUBL Place 5,000 mcg under the tongue daily. (Patient not taking: Reported on 08/18/2023)   [DISCONTINUED] magnesium oxide (MAG-OX) 400 MG tablet Take 400 mg by mouth daily. (Patient not taking: Reported on 08/18/2023)   [DISCONTINUED] oxyCODONE  (ROXICODONE ) 5 MG immediate release tablet Take 1 tablet (5 mg total) by mouth every 6 (six) hours as needed for severe pain (pain score 7-10). (Patient not taking: Reported on 08/18/2023)   [DISCONTINUED] sulfamethoxazole -trimethoprim  (BACTRIM  DS) 800-160 MG tablet Take 1 tablet by mouth 2 (two) times daily. (Patient not taking: Reported on 08/18/2023)   [DISCONTINUED] VITAMIN E  PO Take  2,000 Units by mouth daily. (Patient not taking: Reported on 08/18/2023)   [DISCONTINUED] zinc gluconate 50 MG tablet Take 50 mg by mouth daily. (Patient not taking: Reported on 08/18/2023)   No facility-administered encounter medications on file as of 10/27/2023.     Lab Results  Component Value Date   WBC 6.2 11/11/2022   HGB 15.3 11/11/2022   HCT 46.0 11/11/2022   PLT 152.0 11/11/2022   GLUCOSE 150 (H) 06/09/2023   CHOL 141 03/11/2023   TRIG 243.0 (H) 03/11/2023   HDL 30.10 (L) 03/11/2023   LDLDIRECT 87.0 06/05/2020   LDLCALC 62 03/11/2023   ALT 26 03/11/2023   AST 24 03/11/2023   NA 137 06/09/2023   K 4.8 06/09/2023   CL 104 06/09/2023   CREATININE 1.26 (H) 06/09/2023   BUN 29 (H) 06/09/2023   CO2 23 06/09/2023   TSH 5.06 11/11/2022   PSA 0.50 11/11/2022   INR 1.11 10/19/2011   HGBA1C 7.4 (H) 03/11/2023    No results found.     Assessment & Plan:  Primary hypertension Assessment & Plan: Continue Benicar /hydrochlorothiazide  and spironolactone  as directed.   Follow metabolic panel.  Follow pressure. No changes in medication today.   Orders: -     Basic metabolic panel  with GFR  Mixed hyperlipidemia Assessment & Plan: Continue lipitor.  Low cholesterol diet and exercise.  Check lipid panel today.  Lab Results  Component Value Date   CHOL 141 03/11/2023   HDL 30.10 (L) 03/11/2023   LDLCALC 62 03/11/2023   LDLDIRECT 87.0 06/05/2020   TRIG 243.0 (H) 03/11/2023   CHOLHDL 5 03/11/2023    Orders: -     CBC with Differential/Platelet -     Hepatic function panel -     TSH -     Lipid panel  Diabetes mellitus with cardiac complication (HCC) Assessment & Plan: Currently on mounjaro 5mg  and tolerating as outlined. Discussed the need to get adequate protein. Discussed staying hydrated. No low sugars now. Blood sugars 95-130. Follow met b and A1c.   Orders: -     Hemoglobin A1c -     Microalbumin / creatinine urine ratio  Prostate cancer screening -     PSA,  Medicare  Anxiety Assessment & Plan: Seeing psychiatry.  Appears to be doing well on current regimen. Continues on lamictal  and effexor . Continue f/u with psychiatry. Has appt tomorrow. Plans to discuss decrease in trazodone .    Bipolar 2 disorder [facility]) Assessment & Plan: Seeing psychiatry.  Appears to be doing well on current regimen. Continues on lamictal  and effexor . Continue f/u with psychiatry. Has f/u tomorrow.    Coronary artery disease of native artery of native heart with stable angina pectoris [facility]) Assessment & Plan: Followed by Keith Gross.  No chest pain or sob reported.  Continue risk factor modification. Continue benicar /hctz and amlodipine  and atorvastatin .  No changes in medication today.    Thrombocytopenia (HCC) Assessment & Plan: Follow cbc.    Sleep apnea, unspecified type Assessment & Plan: Using cpap regularly. Continue.    History of colonic polyps Assessment & Plan: Colonoscopy 02/04/22 - descending colon polyps (4) - adenomatous polyps.  Recommended f/u colonoscopy in one year discussed.  Plans to f/u with gastroenterology - Keith Gross.    Gastroesophageal reflux disease with esophagitis without hemorrhage Assessment & Plan: No upper symptoms reported.  Continue protonix .    Hypogonadism in male Assessment & Plan: Is seeing Keith Gross. Apparently on high dose testosterone . Check psa. Continue f/u with urology. Need records to review.  Discussed.       Allena Hamilton, MD

## 2023-10-27 NOTE — Assessment & Plan Note (Signed)
 Continue Benicar /hydrochlorothiazide  and spironolactone  as directed.   Follow metabolic panel.  Follow pressure. No changes in medication today.

## 2023-10-27 NOTE — Assessment & Plan Note (Signed)
 Seeing psychiatry.  Appears to be doing well on current regimen. Continues on lamictal  and effexor . Continue f/u with psychiatry. Has f/u tomorrow.

## 2023-10-27 NOTE — Assessment & Plan Note (Signed)
 Using cpap regularly. Continue.

## 2023-10-27 NOTE — Assessment & Plan Note (Signed)
 Currently on mounjaro 5mg  and tolerating as outlined. Discussed the need to get adequate protein. Discussed staying hydrated. No low sugars now. Blood sugars 95-130. Follow met b and A1c.

## 2023-10-27 NOTE — Assessment & Plan Note (Signed)
Colonoscopy 02/04/22 - descending colon polyps (4) - adenomatous polyps.  Recommended f/u colonoscopy in one year discussed.  Plans to f/u with gastroenterology - Keith Gross.

## 2023-10-27 NOTE — Assessment & Plan Note (Signed)
 Continue lipitor.  Low cholesterol diet and exercise.  Check lipid panel today.  Lab Results  Component Value Date   CHOL 141 03/11/2023   HDL 30.10 (L) 03/11/2023   LDLCALC 62 03/11/2023   LDLDIRECT 87.0 06/05/2020   TRIG 243.0 (H) 03/11/2023   CHOLHDL 5 03/11/2023

## 2023-10-27 NOTE — Assessment & Plan Note (Signed)
 No upper symptoms reported.  Continue protonix.

## 2023-10-27 NOTE — Assessment & Plan Note (Signed)
 Followed by Dr Gollan.  No chest pain or sob reported.  Continue risk factor modification. Continue benicar /hctz and amlodipine  and atorvastatin .  No changes in medication today.

## 2023-10-28 ENCOUNTER — Other Ambulatory Visit: Payer: Self-pay | Admitting: Internal Medicine

## 2023-10-28 ENCOUNTER — Ambulatory Visit: Payer: Self-pay | Admitting: Internal Medicine

## 2023-10-28 DIAGNOSIS — F411 Generalized anxiety disorder: Secondary | ICD-10-CM | POA: Diagnosis not present

## 2023-10-28 DIAGNOSIS — F3181 Bipolar II disorder: Secondary | ICD-10-CM | POA: Diagnosis not present

## 2023-10-28 MED ORDER — ROSUVASTATIN CALCIUM 20 MG PO TABS: 20.0000 mg | ORAL_TABLET | Freq: Every day | ORAL | 1 refills | Status: DC

## 2023-10-28 NOTE — Progress Notes (Signed)
 Rx sent in for crestor  to [facility].

## 2023-10-29 NOTE — Telephone Encounter (Signed)
 Copied from CRM #[id number]. Topic: General - Call Back - No Documentation >> Oct 28, 2023  4:17 PM Leah C wrote: Reason for CRM: Patient is returning the call for Dr.Scott to answer her questions in regards to medication.   [phone] (M)

## 2023-11-03 DIAGNOSIS — H31093 Other chorioretinal scars, bilateral: Secondary | ICD-10-CM | POA: Diagnosis not present

## 2023-11-03 DIAGNOSIS — E119 Type 2 diabetes mellitus without complications: Secondary | ICD-10-CM | POA: Diagnosis not present

## 2023-11-03 DIAGNOSIS — H35343 Macular cyst, hole, or pseudohole, bilateral: Secondary | ICD-10-CM | POA: Diagnosis not present

## 2023-11-26 ENCOUNTER — Other Ambulatory Visit: Payer: Self-pay | Admitting: Internal Medicine

## 2023-11-26 ENCOUNTER — Other Ambulatory Visit: Payer: Self-pay | Admitting: Cardiovascular Disease

## 2023-12-30 ENCOUNTER — Other Ambulatory Visit: Payer: Self-pay | Admitting: Cardiovascular Disease

## 2023-12-30 NOTE — Telephone Encounter (Signed)
Please contact pt for future appointment. Pt overdue for follow up.

## 2023-12-31 NOTE — Telephone Encounter (Signed)
 LVM to schedule f/u appt

## 2024-01-01 ENCOUNTER — Other Ambulatory Visit: Payer: Self-pay | Admitting: Cardiovascular Disease

## 2024-01-11 NOTE — Telephone Encounter (Signed)
 Left message on VM.

## 2024-01-21 ENCOUNTER — Encounter: Payer: Self-pay | Admitting: Cardiovascular Disease

## 2024-01-22 NOTE — Telephone Encounter (Signed)
 I have called and LVM to schedule an appointment
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
## 2024-01-25 ENCOUNTER — Ambulatory Visit: Attending: Cardiovascular Disease | Admitting: Cardiovascular Disease

## 2024-01-25 ENCOUNTER — Encounter: Payer: Self-pay | Admitting: Cardiovascular Disease

## 2024-01-25 VITALS — BP 110/60 | HR 75 | Ht 72.0 in | Wt 209.1 lb

## 2024-01-25 DIAGNOSIS — E782 Mixed hyperlipidemia: Secondary | ICD-10-CM | POA: Diagnosis not present

## 2024-01-25 DIAGNOSIS — I25118 Atherosclerotic heart disease of native coronary artery with other forms of angina pectoris: Secondary | ICD-10-CM

## 2024-01-25 DIAGNOSIS — I10 Essential (primary) hypertension: Secondary | ICD-10-CM | POA: Diagnosis not present

## 2024-01-25 DIAGNOSIS — G4730 Sleep apnea, unspecified: Secondary | ICD-10-CM

## 2024-01-25 DIAGNOSIS — E1159 Type 2 diabetes mellitus with other circulatory complications: Secondary | ICD-10-CM | POA: Diagnosis not present

## 2024-01-25 MED ORDER — OLMESARTAN MEDOXOMIL-HCTZ 40-25 MG PO TABS: 1.0000 | ORAL_TABLET | Freq: Every day | ORAL | 3 refills | Status: AC

## 2024-01-25 NOTE — Patient Instructions (Addendum)
 Medication Instructions:   Please restart crestor    If you need a refill on your cardiac medications before your next appointment, please call your pharmacy.   Lab work: No new labs needed  Testing/Procedures: No new testing needed  Follow-Up: At [facility], you and your health needs are our priority.  As part of our continuing mission to provide you with exceptional heart care, we have created designated Provider Care Teams.  These Care Teams include your primary Cardiologist (physician) and Advanced Practice Providers (APPs -  Physician Assistants and Nurse Practitioners) who all work together to provide you with the care you need, when you need it.  You will need a follow up appointment in 12 months  Providers on your designated Care Team:   Lonni Meager, NP Bernardino Bring, PA-C Cadence [place], [place]  COVID-19 Vaccine Information can be found at: [email or web address] For questions related to vaccine distribution or appointments, please email [email or web address] or call [phone].

## 2024-01-29 ENCOUNTER — Encounter: Payer: Self-pay | Admitting: Pharmacist

## 2024-01-29 NOTE — Progress Notes (Signed)
 Pharmacy Quality Measure Review  This patient is appearing on a report for being at risk of failing the adherence measure for diabetes medications this calendar year.   Medication: Mounjaro 5 mg Last fill date: 09/28/23 for 84 day supply  Insurance report was not up to date. No action needed at this time.  Medication has been refilled as of 12/25/23 x84 ds.  Next refill due 2026

## 2024-02-24 ENCOUNTER — Other Ambulatory Visit: Payer: Self-pay | Admitting: Internal Medicine

## 2024-03-01 ENCOUNTER — Ambulatory Visit: Admitting: Internal Medicine

## 2024-03-01 ENCOUNTER — Encounter: Payer: Self-pay | Admitting: Internal Medicine

## 2024-03-01 VITALS — BP 126/74 | HR 77 | Temp 97.9°F | Ht 72.0 in | Wt 207.4 lb

## 2024-03-01 DIAGNOSIS — I25118 Atherosclerotic heart disease of native coronary artery with other forms of angina pectoris: Secondary | ICD-10-CM

## 2024-03-01 DIAGNOSIS — R0981 Nasal congestion: Secondary | ICD-10-CM | POA: Diagnosis not present

## 2024-03-01 DIAGNOSIS — E1159 Type 2 diabetes mellitus with other circulatory complications: Secondary | ICD-10-CM

## 2024-03-01 DIAGNOSIS — I10 Essential (primary) hypertension: Secondary | ICD-10-CM | POA: Diagnosis not present

## 2024-03-01 DIAGNOSIS — K2100 Gastro-esophageal reflux disease with esophagitis, without bleeding: Secondary | ICD-10-CM | POA: Diagnosis not present

## 2024-03-01 DIAGNOSIS — F3181 Bipolar II disorder: Secondary | ICD-10-CM | POA: Diagnosis not present

## 2024-03-01 DIAGNOSIS — Z860100 Personal history of colon polyps, unspecified: Secondary | ICD-10-CM

## 2024-03-01 DIAGNOSIS — E782 Mixed hyperlipidemia: Secondary | ICD-10-CM | POA: Diagnosis not present

## 2024-03-01 DIAGNOSIS — G4730 Sleep apnea, unspecified: Secondary | ICD-10-CM

## 2024-03-01 DIAGNOSIS — Z7985 Long-term (current) use of injectable non-insulin antidiabetic drugs: Secondary | ICD-10-CM

## 2024-03-01 LAB — HEPATIC FUNCTION PANEL
ALT: 28 U/L (ref 3–53)
AST: 26 U/L (ref 5–37)
Albumin: 4.7 g/dL (ref 3.5–5.2)
Alkaline Phosphatase: 41 U/L (ref 39–117)
Bilirubin, Direct: 0.1 mg/dL (ref 0.1–0.3)
Total Bilirubin: 0.6 mg/dL (ref 0.2–1.2)
Total Protein: 7 g/dL (ref 6.0–8.3)

## 2024-03-01 LAB — CBC WITH DIFFERENTIAL/PLATELET
Basophils Absolute: 0 K/uL (ref 0.0–0.1)
Basophils Relative: 0.5 % (ref 0.0–3.0)
Eosinophils Absolute: 0.1 K/uL (ref 0.0–0.7)
Eosinophils Relative: 1.5 % (ref 0.0–5.0)
HCT: 50.3 % (ref 39.0–52.0)
Hemoglobin: 17.1 g/dL — ABNORMAL HIGH (ref 13.0–17.0)
Lymphocytes Relative: 20.1 % (ref 12.0–46.0)
Lymphs Abs: 1.6 K/uL (ref 0.7–4.0)
MCHC: 33.9 g/dL (ref 30.0–36.0)
MCV: 93.3 fl (ref 78.0–100.0)
Monocytes Absolute: 0.6 K/uL (ref 0.1–1.0)
Monocytes Relative: 7.3 % (ref 3.0–12.0)
Neutro Abs: 5.6 K/uL (ref 1.4–7.7)
Neutrophils Relative %: 70.6 % (ref 43.0–77.0)
Platelets: 166 K/uL (ref 150.0–400.0)
RBC: 5.39 Mil/uL (ref 4.22–5.81)
RDW: 13.1 % (ref 11.5–15.5)
WBC: 7.9 K/uL (ref 4.0–10.5)

## 2024-03-01 LAB — BASIC METABOLIC PANEL WITH GFR
BUN: 29 mg/dL — ABNORMAL HIGH (ref 6–23)
CO2: 27 meq/L (ref 19–32)
Calcium: 9.7 mg/dL (ref 8.4–10.5)
Chloride: 103 meq/L (ref 96–112)
Creatinine, Ser: 1.35 mg/dL (ref 0.40–1.50)
GFR: 54.31 mL/min — ABNORMAL LOW
Glucose, Bld: 108 mg/dL — ABNORMAL HIGH (ref 70–99)
Potassium: 4.1 meq/L (ref 3.5–5.1)
Sodium: 138 meq/L (ref 135–145)

## 2024-03-01 LAB — LIPID PANEL
Cholesterol: 87 mg/dL (ref 28–200)
HDL: 30.5 mg/dL — ABNORMAL LOW
LDL Cholesterol: 25 mg/dL (ref 10–99)
NonHDL: 56
Total CHOL/HDL Ratio: 3
Triglycerides: 154 mg/dL — ABNORMAL HIGH (ref 10.0–149.0)
VLDL: 30.8 mg/dL (ref 0.0–40.0)

## 2024-03-01 LAB — HM DIABETES FOOT EXAM

## 2024-03-01 LAB — HEMOGLOBIN A1C: Hgb A1c MFr Bld: 5.8 % (ref 4.6–6.5)

## 2024-03-01 MED ORDER — TAMSULOSIN HCL 0.4 MG PO CAPS: 0.4000 mg | ORAL_CAPSULE | Freq: Every day | ORAL | 1 refills | Status: AC

## 2024-03-01 MED ORDER — PANTOPRAZOLE SODIUM 40 MG PO TBEC: 40.0000 mg | DELAYED_RELEASE_TABLET | Freq: Every day | ORAL | 1 refills | Status: AC

## 2024-03-01 MED ORDER — AMLODIPINE BESYLATE 10 MG PO TABS: 10.0000 mg | ORAL_TABLET | Freq: Every day | ORAL | 1 refills | Status: AC

## 2024-03-01 MED ORDER — SPIRONOLACTONE 25 MG PO TABS: 12.5000 mg | ORAL_TABLET | Freq: Every day | ORAL | 1 refills | Status: AC

## 2024-03-01 MED ORDER — ROSUVASTATIN CALCIUM 20 MG PO TABS: 20.0000 mg | ORAL_TABLET | Freq: Every day | ORAL | 1 refills | Status: AC

## 2024-03-01 NOTE — Progress Notes (Signed)
 "  Subjective:    Patient ID: Keith Gross., male    DOB: [birth year], 69 y.o.   MRN: [id number]  Patient here for  Chief Complaint  Patient presents with   Medical Management of Chronic Issues    HPI Here for a scheduled follow up -  follow up regarding hypercholesterolemia and hypertension. Remains on benicar /hydrochlorothiazide , amlodipine  and spironolactone . Seeing psychiatry. Continues on lamictal  and effexor .  Continues cpap. Is followed by Dr Gollan. Evalusted 01/25/24 - restart crestor . Has been seeing Dr Jullie Erp (nutrition specialist). He started him on mounjaro. Taking 7.5mg  weekly now. Just started one week ago. Some constipation. Taking miralax  daily - 2-3 caps per day. Bowels are moving. Some nasal congestion - using nasacort  and taking mucinex. Discussed using saline nasal spray. Does not feel needs any further intervention. No chest congestion or sob. Using cpap.    Past Medical History:  Diagnosis Date   Acid reflux    Angina 2013   no current problems   Anxiety    Arthritis    Bipolar disorder (HCC)    type 2   Depression    Diabetes mellitus without complication (HCC)    type 2   Diastasis of muscle 2011   abdominal   Dysplastic polyp of colon 2008   [facility]   GERD (gastroesophageal reflux disease)    Gout    Multiple episodes - no current problems   Heart disease    History of kidney stones    surgery to remove stones   Hyperlipidemia    Hypertension    Myocardial infarction (HCC) 05/2011   we think;  06/2011 abnl mv;  06/2011 Cath - subtotal occlusion of Ramus, otw nonobs dzs;  06/2011 PCI/DES of Ramus w/ 2.25x38mm Resolute DES    Sleep apnea    uses CPAP nightly   Syncopal episodes 05/2020   Syncope 05/2020   Treadmill stress test negative for angina pectoris 2011   Past Surgical History:  Procedure Laterality Date   25 GAUGE PARS PLANA VITRECTOMY WITH 20 GAUGE MVR PORT FOR MACULAR HOLE Right 07/19/2020   Procedure: 25 GAUGE PARS PLANA VITRECTOMY  FOR MACULAR HOLE;  Surgeon: Valdemar Rogue, MD;  Location: [facility] OR;  Service: Ophthalmology;  Laterality: Right;   CARDIAC CATHETERIZATION  06/2011   diagnostic   COLONOSCOPY     CORONARY ANGIOPLASTY WITH STENT PLACEMENT  07/09/11   1   GAS/FLUID EXCHANGE Right 07/19/2020   Procedure: GAS/FLUID EXCHANGE;  Surgeon: Valdemar Rogue, MD;  Location: [facility] OR;  Service: Ophthalmology;  Laterality: Right;  c3 f8   HEMORRHOID SURGERY  2000   INGUINAL HERNIA REPAIR  1987   left   LEFT HEART CATHETERIZATION WITH CORONARY ANGIOGRAM N/A 10/21/2011   Procedure: LEFT HEART CATHETERIZATION WITH CORONARY ANGIOGRAM;  Surgeon: Debby JONETTA Como, MD;  Location: [facility] CATH LAB;  Service: Cardiovascular;  Laterality: N/A;   LITHOTRIPSY  10/2008   MEMBRANE PEEL Right 07/19/2020   Procedure: MEMBRANE PEEL;  Surgeon: Valdemar Rogue, MD;  Location: [facility] OR;  Service: Ophthalmology;  Laterality: Right;   PENILE PROSTHESIS IMPLANT  06/10/2023   Procedure: INSERTION, PENILE PROSTHESIS, INFLATABLE;  Surgeon: Lovie Arlyss CROME, MD;  Location: [facility];  Service: Urology;;   PERCUTANEOUS CORONARY STENT INTERVENTION (PCI-S) N/A 07/09/2011   Procedure: PERCUTANEOUS CORONARY STENT INTERVENTION (PCI-S);  Surgeon: Ozell Fell, MD;  Location: [facility] CATH LAB;  Service: Cardiovascular;  Laterality: N/A;   PHOTOCOAGULATION WITH LASER Right 07/19/2020   Procedure: PHOTOCOAGULATION WITH LASER;  Surgeon: Valdemar Rogue, MD;  Location: [facility] OR;  Service: Ophthalmology;  Laterality: Right;   REMOVAL OF PENILE PROSTHESIS N/A 06/10/2023   Procedure: REMOVAL, PENILE PROSTHESIS;  Surgeon: Lovie Arlyss CROME, MD;  Location:  SURGERY CENTER;  Service: Urology;  Laterality: N/A;  REMOVAL AND REPLACEMENT OF INFLATABLE PENILE PROSTHESIS   UPPER GI ENDOSCOPY     URETEROSCOPY  10/2009   Family History  Problem Relation Age of Onset   Heart attack Father 59       LVAD   Suicidality Maternal Uncle    Hypertension Other    Social History    Socioeconomic History   Marital status: Married    Spouse name: Not on file   Number of children: 4   Years of education: Not on file   Highest education level: Some college, no degree  Occupational History   Occupation: Horticulturist, commercial  Tobacco Use   Smoking status: Former    Current packs/day: 0.00    Average packs/day: 0.5 packs/day for 20.0 years (10.0 ttl pk-yrs)    Types: Cigarettes    Start date: 02/17/1970    Quit date: 02/17/1990    Years since quitting: 34.0   Smokeless tobacco: Never  Vaping Use   Vaping status: Never Used  Substance and Sexual Activity   Alcohol use: Not Currently    Alcohol/week: 4.0 standard drinks of alcohol    Types: 2 Glasses of wine, 2 Cans of beer per week   Drug use: Never    Frequency: 7.0 times per week    Comment: 07/09/11 lets say a cigarette a day Last used   Sexual activity: Yes    Birth control/protection: None  Other Topics Concern   Not on file  Social History Narrative   Married and has 4 children.     Social Drivers of Health   Tobacco Use: Medium Risk (03/06/2024)   Patient History    Smoking Tobacco Use: Former    Smokeless Tobacco Use: Never    Passive Exposure: Not on file  Financial Resource Strain: Low Risk (08/18/2023)   Overall Financial Resource Strain (CARDIA)    Difficulty of Paying Living Expenses: Not hard at all  Food Insecurity: No Food Insecurity (08/18/2023)   Epic    Worried About Programme Researcher, Broadcasting/film/video in the Last Year: Never true    Ran Out of Food in the Last Year: Never true  Transportation Needs: No Transportation Needs (08/18/2023)   Epic    Lack of Transportation (Medical): No    Lack of Transportation (Non-Medical): No  Physical Activity: Insufficiently Active (08/18/2023)   Exercise Vital Sign    Days of Exercise per Week: 2 days    Minutes of Exercise per Session: 60 min  Stress: No Stress Concern Present (08/18/2023)   [employer] of Occupational Health - Occupational Stress  Questionnaire    Feeling of Stress: Only a little  Social Connections: Moderately Integrated (08/18/2023)   Social Connection and Isolation Panel    Frequency of Communication with Friends and Family: More than three times a week    Frequency of Social Gatherings with Friends and Family: Once a week    Attends Religious Services: More than 4 times per year    Active Member of Clubs or Organizations: No    Attends Banker Meetings: Never    Marital Status: Married  Depression (PHQ2-9): Low Risk (03/01/2024)   Depression (PHQ2-9)    PHQ-2 Score: 2  Alcohol Screen: Low  Risk (08/18/2023)   Alcohol Screen    Last Alcohol Screening Score (AUDIT): 0  Housing: Unknown (08/18/2023)   Epic    Unable to Pay for Housing in the Last Year: No    Number of Times Moved in the Last Year: Not on file    Homeless in the Last Year: No  Utilities: Not At Risk (08/18/2023)   Epic    Threatened with loss of utilities: No  Health Literacy: Adequate Health Literacy (08/18/2023)   B1300 Health Literacy    Frequency of need for help with medical instructions: Never     Review of Systems  Constitutional:  Negative for appetite change and unexpected weight change.  HENT:  Positive for congestion. Negative for sore throat.   Respiratory:  Negative for cough, chest tightness and shortness of breath.   Cardiovascular:  Negative for chest pain, palpitations and leg swelling.  Gastrointestinal:  Negative for abdominal pain, diarrhea, nausea and vomiting.  Genitourinary:  Negative for difficulty urinating and dysuria.  Musculoskeletal:  Negative for joint swelling and myalgias.  Skin:  Negative for color change and rash.  Neurological:  Negative for dizziness and headaches.  Psychiatric/Behavioral:  Negative for agitation and dysphoric mood.        Objective:     BP 126/74   Pulse 77   Temp 97.9 F (36.6 C) (Oral)   Ht 6' (1.829 m)   Wt 207 lb 6.4 oz (94.1 kg)   SpO2 96%   BMI 28.13 kg/m  Wt  Readings from Last 3 Encounters:  03/01/24 207 lb 6.4 oz (94.1 kg)  01/25/24 209 lb 2 oz (94.9 kg)  10/27/23 202 lb 12.8 oz (92 kg)    Physical Exam Vitals reviewed.  Constitutional:      General: He is not in acute distress.    Appearance: Normal appearance. He is well-developed.  HENT:     Head: Normocephalic and atraumatic.     Right Ear: External ear normal.     Left Ear: External ear normal.     Mouth/Throat:     Pharynx: No oropharyngeal exudate or posterior oropharyngeal erythema.  Eyes:     General: No scleral icterus.       Right eye: No discharge.        Left eye: No discharge.     Conjunctiva/sclera: Conjunctivae normal.  Cardiovascular:     Rate and Rhythm: Normal rate and regular rhythm.  Pulmonary:     Effort: Pulmonary effort is normal. No respiratory distress.     Breath sounds: Normal breath sounds.  Abdominal:     General: Bowel sounds are normal.     Palpations: Abdomen is soft.     Tenderness: There is no abdominal tenderness.  Musculoskeletal:        General: No swelling or tenderness.     Cervical back: Neck supple. No tenderness.  Lymphadenopathy:     Cervical: No cervical adenopathy.  Skin:    Findings: No erythema or rash.  Neurological:     Mental Status: He is alert.  Psychiatric:        Mood and Affect: Mood normal.        Behavior: Behavior normal.         Outpatient Encounter Medications as of 03/01/2024  Medication Sig   aspirin  EC 81 MG tablet Take 1 tablet (81 mg total) by mouth daily. Swallow whole.   BD HYPODERMIC NEEDLE 16G X 1 MISC FOR USE WITH TESTOSTERONE  INJECTIONS   fluticasone  (  FLONASE ) 50 MCG/ACT nasal spray USE 2 SPRAYS IN EACH NOSTRIL DAILY AS DIRECTED   hydrOXYzine  (ATARAX ) 50 MG tablet Take 50 mg by mouth 2 (two) times daily.   lamoTRIgine  (LAMICTAL ) 150 MG tablet Take 1 tablet (150 mg total) by mouth at bedtime. (Patient taking differently: Take 150 mg by mouth 2 (two) times daily.)   Lancets (ONETOUCH ULTRASOFT)  lancets Use as instructed to check sugars once daily. Dx E 11.9   MOUNJARO 7.5 MG/0.5ML Pen Inject 7.5 mg into the skin.   Multiple Vitamin (MULTIVITAMIN) tablet Take 1 tablet by mouth daily.   olmesartan -hydrochlorothiazide  (BENICAR  HCT) 40-25 MG tablet Take 1 tablet by mouth at bedtime.   pantoprazole  (PROTONIX ) 40 MG tablet TAKE 1 TABLET BY MOUTH DAILY   polyethylene glycol powder (GLYCOLAX /MIRALAX ) powder TAKE 17 GRAMS BY MOUTH EVERY DAY as needed (as directed) (Patient taking differently: daily. TAKE 17 GRAMS BY MOUTH EVERY DAY)   spironolactone  (ALDACTONE ) 25 MG tablet TAKE A HALF TABLET BY MOUTH DAILY   SYRINGE-NEEDLE, DISP, 3 ML (B-D 3CC LUER-LOK SYR 22GX1) 22G X 1 3 ML MISC FOR TESTOSTERONE  INJECTIONS   testosterone  cypionate (DEPOTESTOSTERONE CYPIONATE) 200 MG/ML injection Inject 0.7 mLs (140 mg total) into the muscle every 7 (seven) days. (Patient taking differently: Inject 140 mg into the muscle every 7 (seven) days. Pt stated taking twice weekly)   traZODone  (DESYREL ) 100 MG tablet Take 2 tablets (200 mg total) by mouth at bedtime as needed for sleep.   venlafaxine  XR (EFFEXOR -XR) 150 MG 24 hr capsule Take 1 capsule (150 mg total) by mouth daily.   VITAMIN D, CHOLECALCIFEROL, PO Take 2,000 mg by mouth daily.   amLODipine  (NORVASC ) 10 MG tablet Take 1 tablet (10 mg total) by mouth daily.   pantoprazole  (PROTONIX ) 40 MG tablet Take 1 tablet (40 mg total) by mouth daily.   rosuvastatin  (CRESTOR ) 20 MG tablet Take 1 tablet (20 mg total) by mouth daily.   spironolactone  (ALDACTONE ) 25 MG tablet Take 0.5 tablets (12.5 mg total) by mouth daily.   tamsulosin  (FLOMAX ) 0.4 MG CAPS capsule Take 1 capsule (0.4 mg total) by mouth daily.   [DISCONTINUED] amLODipine  (NORVASC ) 10 MG tablet TAKE 1 TABLET BY MOUTH DAILY   [DISCONTINUED] hydrOXYzine  (ATARAX ) 25 MG tablet Take 25 mg by mouth in the morning and at bedtime.   [DISCONTINUED] MOUNJARO 5 MG/0.5ML Pen Inject 5 mg into the skin once a week.    [DISCONTINUED] pantoprazole  (PROTONIX ) 40 MG tablet TAKE 1 TABLET BY MOUTH DAILY   [DISCONTINUED] rosuvastatin  (CRESTOR ) 20 MG tablet Take 1 tablet (20 mg total) by mouth daily.   [DISCONTINUED] spironolactone  (ALDACTONE ) 25 MG tablet TAKE A HALF TABLET BY MOUTH DAILY   [DISCONTINUED] tamsulosin  (FLOMAX ) 0.4 MG CAPS capsule TAKE 1 CAPSULE BY MOUTH DAILY   No facility-administered encounter medications on file as of 03/01/2024.     Lab Results  Component Value Date   WBC 7.9 03/01/2024   HGB 17.1 (H) 03/01/2024   HCT 50.3 03/01/2024   PLT 166.0 03/01/2024   GLUCOSE 108 (H) 03/01/2024   CHOL 87 03/01/2024   TRIG 154.0 (H) 03/01/2024   HDL 30.50 (L) 03/01/2024   LDLDIRECT 87.0 06/05/2020   LDLCALC 25 03/01/2024   ALT 28 03/01/2024   AST 26 03/01/2024   NA 138 03/01/2024   K 4.1 03/01/2024   CL 103 03/01/2024   CREATININE 1.35 03/01/2024   BUN 29 (H) 03/01/2024   CO2 27 03/01/2024   TSH 3.53 10/27/2023   PSA  0.50 10/27/2023   INR 1.11 10/19/2011   HGBA1C 5.8 03/01/2024   MICROALBUR <0.7 10/27/2023       Assessment & Plan:  Sleep apnea, unspecified type Assessment & Plan: Continue cpap. Uses regularly and benefits from using.    Diabetes mellitus with cardiac complication [facility]) Assessment & Plan: Currently on mounjaro and tolerating as outlined. Just started 7.5mg  dose. Discussed importance of keeping bowels moving. Discussed the need to get adequate protein. Discussed staying hydrated. No low sugars. Diet and exercise. Follow met b and A1c.   Orders: -     Hemoglobin A1c  Mixed hyperlipidemia Assessment & Plan: Continue lipitor.  Low cholesterol diet and exercise.  Follow lipid panel.  Lab Results  Component Value Date   CHOL 87 03/01/2024   HDL 30.50 (L) 03/01/2024   LDLCALC 25 03/01/2024   LDLDIRECT 87.0 06/05/2020   TRIG 154.0 (H) 03/01/2024   CHOLHDL 3 03/01/2024    Orders: -     Lipid panel -     Hepatic function panel -     CBC with  Differential/Platelet  Primary hypertension Assessment & Plan: Continue Benicar /hydrochlorothiazide  and spironolactone  as directed.   Follow metabolic panel.  Follow pressures. No change in medication.   Orders: -     Basic metabolic panel with GFR  History of colonic polyps Assessment & Plan: Colonoscopy 02/04/22 - descending colon polyps (4) - adenomatous polyps.  Recommended f/u colonoscopy in one year discussed.  Plans to f/u with gastroenterology - [facility].    Gastroesophageal reflux disease with esophagitis without hemorrhage Assessment & Plan: No upper symptoms. Continue protonix .    Coronary artery disease of native artery of native heart with stable angina pectoris Assessment & Plan: Followed by Dr Gollan.  No chest pain or sob reported.  Continue risk factor modification. Continue benicar /hctz and amlodipine  and atorvastatin .  Stable.    Bipolar 2 disorder [facility]) Assessment & Plan: Seeing psychiatry.  Appears to be doing well on current regimen. Continues on lamictal  and effexor . Continue f/u with psychiatry.    Nasal congestion Assessment & Plan: Using nasacort  and mucinex. Discussed adding saline nasal spray. Lungs are clear. No sob. Follow. Call with update.    Other orders -     amLODIPine  Besylate; Take 1 tablet (10 mg total) by mouth daily.  Dispense: 90 tablet; Refill: 1 -     Pantoprazole  Sodium; Take 1 tablet (40 mg total) by mouth daily.  Dispense: 100 tablet; Refill: 1 -     Rosuvastatin  Calcium ; Take 1 tablet (20 mg total) by mouth daily.  Dispense: 90 tablet; Refill: 1 -     Spironolactone ; Take 0.5 tablets (12.5 mg total) by mouth daily.  Dispense: 45 tablet; Refill: 1 -     Tamsulosin  HCl; Take 1 capsule (0.4 mg total) by mouth daily.  Dispense: 100 capsule; Refill: 1     Allena Hamilton, MD "

## 2024-03-02 ENCOUNTER — Ambulatory Visit: Payer: Self-pay | Admitting: Internal Medicine

## 2024-03-02 DIAGNOSIS — E782 Mixed hyperlipidemia: Secondary | ICD-10-CM

## 2024-03-02 DIAGNOSIS — I10 Essential (primary) hypertension: Secondary | ICD-10-CM

## 2024-03-06 ENCOUNTER — Encounter: Payer: Self-pay | Admitting: Internal Medicine

## 2024-03-06 NOTE — Assessment & Plan Note (Signed)
Colonoscopy 02/04/22 - descending colon polyps (4) - adenomatous polyps.  Recommended f/u colonoscopy in one year discussed.  Plans to f/u with gastroenterology - Keith Gross.

## 2024-03-06 NOTE — Assessment & Plan Note (Signed)
 Followed by Dr Gollan.  No chest pain or sob reported.  Continue risk factor modification. Continue benicar /hctz and amlodipine  and atorvastatin .  Stable.

## 2024-03-06 NOTE — Assessment & Plan Note (Signed)
 Continue lipitor.  Low cholesterol diet and exercise.  Follow lipid panel.  Lab Results  Component Value Date   CHOL 87 03/01/2024   HDL 30.50 (L) 03/01/2024   LDLCALC 25 03/01/2024   LDLDIRECT 87.0 06/05/2020   TRIG 154.0 (H) 03/01/2024   CHOLHDL 3 03/01/2024

## 2024-03-06 NOTE — Assessment & Plan Note (Signed)
 Seeing psychiatry.  Appears to be doing well on current regimen. Continues on lamictal and effexor. Continue f/u with psychiatry.

## 2024-03-06 NOTE — Assessment & Plan Note (Signed)
 No upper symptoms.  Continue protonix .

## 2024-03-06 NOTE — Assessment & Plan Note (Signed)
 Using nasacort  and mucinex. Discussed adding saline nasal spray. Lungs are clear. No sob. Follow. Call with update.

## 2024-03-06 NOTE — Assessment & Plan Note (Signed)
 Currently on mounjaro and tolerating as outlined. Just started 7.5mg  dose. Discussed importance of keeping bowels moving. Discussed the need to get adequate protein. Discussed staying hydrated. No low sugars. Diet and exercise. Follow met b and A1c.

## 2024-03-06 NOTE — Assessment & Plan Note (Signed)
 Continue cpap. Uses regularly and benefits from using.

## 2024-03-06 NOTE — Assessment & Plan Note (Signed)
 Continue Benicar /hydrochlorothiazide  and spironolactone  as directed.   Follow metabolic panel.  Follow pressures. No change in medication.

## 2024-03-07 NOTE — Telephone Encounter (Unsigned)
 Copied from CRM [id number]. Topic: Clinical - Lab/Test Results >> Mar 07, 2024  1:56 PM Mercedes MATSU wrote: Reason for CRM: Labs were relayed, no call back is needed. Lab appt made as well.

## 2024-03-08 NOTE — Addendum Note (Signed)
 Addended by: Terianna Peggs on: 03/08/2024 03:13 PM   Modules accepted: Orders

## 2024-03-20 ENCOUNTER — Telehealth: Payer: Self-pay

## 2024-03-20 NOTE — Telephone Encounter (Signed)
 I left voicemail for patient asking him to please call us  back to reschedule his 03/22/2024 lab appointment, as we will be operating on a two-hour delay due to inclement weather.  I also sent a message to patient via MyChart.  E2C2 - when patient calls back, please assist him with rescheduling his appointment.

## 2024-03-22 ENCOUNTER — Other Ambulatory Visit

## 2024-03-22 ENCOUNTER — Ambulatory Visit: Payer: Self-pay | Admitting: Internal Medicine

## 2024-03-22 DIAGNOSIS — I10 Essential (primary) hypertension: Secondary | ICD-10-CM | POA: Diagnosis not present

## 2024-03-22 DIAGNOSIS — E782 Mixed hyperlipidemia: Secondary | ICD-10-CM

## 2024-03-22 LAB — BASIC METABOLIC PANEL WITH GFR
BUN: 22 mg/dL (ref 6–23)
CO2: 28 meq/L (ref 19–32)
Calcium: 9.1 mg/dL (ref 8.4–10.5)
Chloride: 101 meq/L (ref 96–112)
Creatinine, Ser: 1.26 mg/dL (ref 0.40–1.50)
GFR: 58.97 mL/min — ABNORMAL LOW
Glucose, Bld: 150 mg/dL — ABNORMAL HIGH (ref 70–99)
Potassium: 3.9 meq/L (ref 3.5–5.1)
Sodium: 136 meq/L (ref 135–145)

## 2024-03-22 LAB — CBC WITH DIFFERENTIAL/PLATELET
Basophils Absolute: 0 10*3/uL (ref 0.0–0.1)
Basophils Relative: 0.5 % (ref 0.0–3.0)
Eosinophils Absolute: 0.1 10*3/uL (ref 0.0–0.7)
Eosinophils Relative: 1.1 % (ref 0.0–5.0)
HCT: 50.3 % (ref 39.0–52.0)
Hemoglobin: 16.9 g/dL (ref 13.0–17.0)
Lymphocytes Relative: 18.6 % (ref 12.0–46.0)
Lymphs Abs: 1.6 10*3/uL (ref 0.7–4.0)
MCHC: 33.6 g/dL (ref 30.0–36.0)
MCV: 93.7 fl (ref 78.0–100.0)
Monocytes Absolute: 0.6 10*3/uL (ref 0.1–1.0)
Monocytes Relative: 7.2 % (ref 3.0–12.0)
Neutro Abs: 6.1 10*3/uL (ref 1.4–7.7)
Neutrophils Relative %: 72.6 % (ref 43.0–77.0)
Platelets: 144 10*3/uL — ABNORMAL LOW (ref 150.0–400.0)
RBC: 5.37 Mil/uL (ref 4.22–5.81)
RDW: 13.4 % (ref 11.5–15.5)
WBC: 8.5 10*3/uL (ref 4.0–10.5)

## 2024-03-23 ENCOUNTER — Encounter: Payer: Self-pay | Admitting: Internal Medicine

## 2024-04-20 ENCOUNTER — Other Ambulatory Visit

## 2024-07-06 ENCOUNTER — Ambulatory Visit: Admitting: Internal Medicine

## 2024-08-29 ENCOUNTER — Ambulatory Visit
